# Patient Record
Sex: Female | Born: 1980 | Race: Black or African American | Hispanic: No | Marital: Single | Smoking: Former smoker
Health system: Southern US, Community
[De-identification: ages and names within clinical notes are randomized; demographics above are authoritative.]

## PROBLEM LIST (undated history)

## (undated) DIAGNOSIS — K219 Gastro-esophageal reflux disease without esophagitis: Secondary | ICD-10-CM

## (undated) DIAGNOSIS — O009 Unspecified ectopic pregnancy without intrauterine pregnancy: Secondary | ICD-10-CM

## (undated) DIAGNOSIS — F319 Bipolar disorder, unspecified: Secondary | ICD-10-CM

## (undated) DIAGNOSIS — A5901 Trichomonal vulvovaginitis: Secondary | ICD-10-CM

## (undated) DIAGNOSIS — N390 Urinary tract infection, site not specified: Secondary | ICD-10-CM

## (undated) DIAGNOSIS — F419 Anxiety disorder, unspecified: Secondary | ICD-10-CM

## (undated) DIAGNOSIS — A749 Chlamydial infection, unspecified: Secondary | ICD-10-CM

## (undated) DIAGNOSIS — G4700 Insomnia, unspecified: Secondary | ICD-10-CM

## (undated) DIAGNOSIS — K5900 Constipation, unspecified: Secondary | ICD-10-CM

## (undated) DIAGNOSIS — G5601 Carpal tunnel syndrome, right upper limb: Secondary | ICD-10-CM

## (undated) HISTORY — DX: Carpal tunnel syndrome, right upper limb: G56.01

---
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
## 2000-01-10 ENCOUNTER — Inpatient Hospital Stay (HOSPITAL_COMMUNITY): Admission: AD | Admit: 2000-01-10 | Discharge: 2000-01-10 | Payer: Self-pay | Admitting: Obstetrics

## 2000-04-17 ENCOUNTER — Inpatient Hospital Stay (HOSPITAL_COMMUNITY): Admission: AD | Admit: 2000-04-17 | Discharge: 2000-04-17 | Payer: Self-pay | Admitting: Obstetrics

## 2000-04-26 ENCOUNTER — Inpatient Hospital Stay (HOSPITAL_COMMUNITY): Admission: EM | Admit: 2000-04-26 | Discharge: 2000-04-26 | Payer: Self-pay | Admitting: *Deleted

## 2000-10-25 ENCOUNTER — Inpatient Hospital Stay (HOSPITAL_COMMUNITY): Admission: AD | Admit: 2000-10-25 | Discharge: 2000-10-25 | Payer: Self-pay | Admitting: Obstetrics

## 2001-03-25 ENCOUNTER — Inpatient Hospital Stay (HOSPITAL_COMMUNITY): Admission: AD | Admit: 2001-03-25 | Discharge: 2001-03-25 | Payer: Self-pay | Admitting: Obstetrics & Gynecology

## 2001-09-12 ENCOUNTER — Inpatient Hospital Stay (HOSPITAL_COMMUNITY): Admission: AD | Admit: 2001-09-12 | Discharge: 2001-09-12 | Payer: Self-pay | Admitting: Obstetrics

## 2001-11-02 ENCOUNTER — Inpatient Hospital Stay (HOSPITAL_COMMUNITY): Admission: AD | Admit: 2001-11-02 | Discharge: 2001-11-02 | Payer: Self-pay | Admitting: *Deleted

## 2002-03-31 ENCOUNTER — Emergency Department (HOSPITAL_COMMUNITY): Admission: EM | Admit: 2002-03-31 | Discharge: 2002-03-31 | Payer: Self-pay | Admitting: Emergency Medicine

## 2002-05-29 ENCOUNTER — Inpatient Hospital Stay (HOSPITAL_COMMUNITY): Admission: AD | Admit: 2002-05-29 | Discharge: 2002-05-29 | Payer: Self-pay

## 2003-07-13 ENCOUNTER — Inpatient Hospital Stay (HOSPITAL_COMMUNITY): Admission: AD | Admit: 2003-07-13 | Discharge: 2003-07-14 | Payer: Self-pay | Admitting: *Deleted

## 2004-08-19 ENCOUNTER — Inpatient Hospital Stay (HOSPITAL_COMMUNITY): Admission: AD | Admit: 2004-08-19 | Discharge: 2004-08-19 | Payer: Self-pay | Admitting: *Deleted

## 2004-11-04 ENCOUNTER — Inpatient Hospital Stay (HOSPITAL_COMMUNITY): Admission: AD | Admit: 2004-11-04 | Discharge: 2004-11-04 | Payer: Self-pay | Admitting: *Deleted

## 2005-06-17 DIAGNOSIS — A562 Chlamydial infection of genitourinary tract, unspecified: Secondary | ICD-10-CM | POA: Insufficient documentation

## 2005-09-02 ENCOUNTER — Emergency Department (HOSPITAL_COMMUNITY): Admission: EM | Admit: 2005-09-02 | Discharge: 2005-09-02 | Payer: Self-pay | Admitting: Emergency Medicine

## 2005-10-02 ENCOUNTER — Emergency Department (HOSPITAL_COMMUNITY): Admission: EM | Admit: 2005-10-02 | Discharge: 2005-10-02 | Payer: Self-pay | Admitting: Family Medicine

## 2005-11-07 ENCOUNTER — Emergency Department (HOSPITAL_COMMUNITY): Admission: EM | Admit: 2005-11-07 | Discharge: 2005-11-07 | Payer: Self-pay | Admitting: Emergency Medicine

## 2006-01-11 ENCOUNTER — Inpatient Hospital Stay (HOSPITAL_COMMUNITY): Admission: AD | Admit: 2006-01-11 | Discharge: 2006-01-11 | Payer: Self-pay | Admitting: Obstetrics

## 2006-03-10 ENCOUNTER — Ambulatory Visit: Payer: Self-pay | Admitting: Gynecology

## 2006-06-28 ENCOUNTER — Emergency Department (HOSPITAL_COMMUNITY): Admission: EM | Admit: 2006-06-28 | Discharge: 2006-06-28 | Payer: Self-pay | Admitting: Family Medicine

## 2006-09-18 ENCOUNTER — Emergency Department (HOSPITAL_COMMUNITY): Admission: EM | Admit: 2006-09-18 | Discharge: 2006-09-18 | Payer: Self-pay | Admitting: Family Medicine

## 2006-09-28 ENCOUNTER — Ambulatory Visit: Payer: Self-pay | Admitting: Internal Medicine

## 2006-09-28 DIAGNOSIS — A5901 Trichomonal vulvovaginitis: Secondary | ICD-10-CM

## 2006-10-12 ENCOUNTER — Ambulatory Visit: Payer: Self-pay | Admitting: *Deleted

## 2006-10-24 HISTORY — PX: ECTOPIC PREGNANCY SURGERY: SHX613

## 2006-12-21 ENCOUNTER — Ambulatory Visit: Payer: Self-pay | Admitting: Internal Medicine

## 2007-01-24 ENCOUNTER — Emergency Department (HOSPITAL_COMMUNITY): Admission: EM | Admit: 2007-01-24 | Discharge: 2007-01-24 | Payer: Self-pay | Admitting: Family Medicine

## 2007-05-04 ENCOUNTER — Ambulatory Visit: Payer: Self-pay | Admitting: Internal Medicine

## 2007-05-11 ENCOUNTER — Encounter (INDEPENDENT_AMBULATORY_CARE_PROVIDER_SITE_OTHER): Payer: Self-pay | Admitting: Internal Medicine

## 2007-05-11 LAB — CONVERTED CEMR LAB
Bilirubin Urine: NEGATIVE
Specific Gravity, Urine: 1.02

## 2007-06-18 ENCOUNTER — Encounter (INDEPENDENT_AMBULATORY_CARE_PROVIDER_SITE_OTHER): Payer: Self-pay | Admitting: Internal Medicine

## 2007-06-18 DIAGNOSIS — K219 Gastro-esophageal reflux disease without esophagitis: Secondary | ICD-10-CM | POA: Insufficient documentation

## 2007-06-18 DIAGNOSIS — R1013 Epigastric pain: Secondary | ICD-10-CM

## 2007-06-18 DIAGNOSIS — N760 Acute vaginitis: Secondary | ICD-10-CM | POA: Insufficient documentation

## 2007-06-18 DIAGNOSIS — K3189 Other diseases of stomach and duodenum: Secondary | ICD-10-CM

## 2007-06-18 DIAGNOSIS — K5900 Constipation, unspecified: Secondary | ICD-10-CM | POA: Insufficient documentation

## 2007-07-11 ENCOUNTER — Encounter (INDEPENDENT_AMBULATORY_CARE_PROVIDER_SITE_OTHER): Payer: Self-pay | Admitting: *Deleted

## 2007-08-03 ENCOUNTER — Encounter (INDEPENDENT_AMBULATORY_CARE_PROVIDER_SITE_OTHER): Payer: Self-pay | Admitting: *Deleted

## 2007-08-07 ENCOUNTER — Emergency Department (HOSPITAL_COMMUNITY): Admission: EM | Admit: 2007-08-07 | Discharge: 2007-08-08 | Payer: Self-pay | Admitting: Emergency Medicine

## 2007-08-10 ENCOUNTER — Encounter: Payer: Self-pay | Admitting: Obstetrics and Gynecology

## 2007-08-10 ENCOUNTER — Ambulatory Visit: Payer: Self-pay | Admitting: Obstetrics and Gynecology

## 2007-08-10 ENCOUNTER — Ambulatory Visit (HOSPITAL_COMMUNITY): Admission: AD | Admit: 2007-08-10 | Discharge: 2007-08-10 | Payer: Self-pay | Admitting: Obstetrics and Gynecology

## 2007-08-22 ENCOUNTER — Ambulatory Visit: Payer: Self-pay | Admitting: *Deleted

## 2007-08-31 ENCOUNTER — Ambulatory Visit: Payer: Self-pay | Admitting: Nurse Practitioner

## 2007-08-31 DIAGNOSIS — F411 Generalized anxiety disorder: Secondary | ICD-10-CM | POA: Insufficient documentation

## 2007-08-31 LAB — CONVERTED CEMR LAB
Chlamydia, Swab/Urine, PCR: NEGATIVE
Nitrite: NEGATIVE
Protein, U semiquant: 30

## 2007-09-05 ENCOUNTER — Telehealth (INDEPENDENT_AMBULATORY_CARE_PROVIDER_SITE_OTHER): Payer: Self-pay | Admitting: Nurse Practitioner

## 2008-01-09 ENCOUNTER — Emergency Department (HOSPITAL_COMMUNITY): Admission: EM | Admit: 2008-01-09 | Discharge: 2008-01-09 | Payer: Self-pay | Admitting: Emergency Medicine

## 2008-01-18 ENCOUNTER — Ambulatory Visit: Payer: Self-pay | Admitting: Internal Medicine

## 2008-01-18 DIAGNOSIS — G4700 Insomnia, unspecified: Secondary | ICD-10-CM | POA: Insufficient documentation

## 2008-01-18 DIAGNOSIS — J309 Allergic rhinitis, unspecified: Secondary | ICD-10-CM | POA: Insufficient documentation

## 2008-01-24 ENCOUNTER — Encounter (INDEPENDENT_AMBULATORY_CARE_PROVIDER_SITE_OTHER): Payer: Self-pay | Admitting: *Deleted

## 2008-02-22 ENCOUNTER — Telehealth (INDEPENDENT_AMBULATORY_CARE_PROVIDER_SITE_OTHER): Payer: Self-pay | Admitting: *Deleted

## 2008-03-07 ENCOUNTER — Ambulatory Visit: Payer: Self-pay | Admitting: Nurse Practitioner

## 2008-03-07 DIAGNOSIS — R634 Abnormal weight loss: Secondary | ICD-10-CM | POA: Insufficient documentation

## 2008-03-07 LAB — CONVERTED CEMR LAB
ALT: 10 units/L (ref 0–35)
Alkaline Phosphatase: 44 units/L (ref 39–117)
BUN: 8 mg/dL (ref 6–23)
Basophils Absolute: 0 10*3/uL (ref 0.0–0.1)
Basophils Relative: 0 % (ref 0–1)
Benzodiazepines.: NEGATIVE
Beta hcg, urine, semiquantitative: NEGATIVE
Chloride: 106 meq/L (ref 96–112)
GC Probe Amp, Genital: NEGATIVE
Glucose, Bld: 87 mg/dL (ref 70–99)
Glucose, Urine, Semiquant: NEGATIVE
HCT: 36 % (ref 36.0–46.0)
Hemoglobin: 11.7 g/dL — ABNORMAL LOW (ref 12.0–15.0)
KOH Prep: NEGATIVE
Ketones, urine, test strip: NEGATIVE
Lymphs Abs: 1.8 10*3/uL (ref 0.7–4.0)
Monocytes Relative: 16 % — ABNORMAL HIGH (ref 3–12)
Pap Smear: NEGATIVE
Phencyclidine (PCP): NEGATIVE
Potassium: 4.8 meq/L (ref 3.5–5.3)
RBC: 4.18 M/uL (ref 3.87–5.11)
RDW: 13.8 % (ref 11.5–15.5)
Specific Gravity, Urine: 1.02
Total Bilirubin: 0.5 mg/dL (ref 0.3–1.2)
Total Protein: 7.1 g/dL (ref 6.0–8.3)
Urobilinogen, UA: 1
WBC Urine, dipstick: NEGATIVE
WBC: 4.2 10*3/uL (ref 4.0–10.5)
pH: 7

## 2008-03-13 ENCOUNTER — Encounter (INDEPENDENT_AMBULATORY_CARE_PROVIDER_SITE_OTHER): Payer: Self-pay | Admitting: Nurse Practitioner

## 2008-04-24 ENCOUNTER — Ambulatory Visit: Payer: Self-pay | Admitting: Nurse Practitioner

## 2008-04-24 LAB — CONVERTED CEMR LAB
KOH Prep: NEGATIVE
Ketones, urine, test strip: NEGATIVE
Nitrite: NEGATIVE
Protein, U semiquant: 30
Urobilinogen, UA: 0.2
Whiff Test: POSITIVE
pH: 5.5

## 2008-05-29 ENCOUNTER — Ambulatory Visit: Payer: Self-pay | Admitting: Nurse Practitioner

## 2008-06-12 ENCOUNTER — Telehealth (INDEPENDENT_AMBULATORY_CARE_PROVIDER_SITE_OTHER): Payer: Self-pay | Admitting: Internal Medicine

## 2008-07-22 ENCOUNTER — Telehealth (INDEPENDENT_AMBULATORY_CARE_PROVIDER_SITE_OTHER): Payer: Self-pay | Admitting: Nurse Practitioner

## 2008-10-10 ENCOUNTER — Telehealth (INDEPENDENT_AMBULATORY_CARE_PROVIDER_SITE_OTHER): Payer: Self-pay | Admitting: Nurse Practitioner

## 2008-11-06 ENCOUNTER — Telehealth (INDEPENDENT_AMBULATORY_CARE_PROVIDER_SITE_OTHER): Payer: Self-pay | Admitting: Nurse Practitioner

## 2008-11-20 ENCOUNTER — Ambulatory Visit: Payer: Self-pay | Admitting: Nurse Practitioner

## 2008-11-20 LAB — CONVERTED CEMR LAB
Bilirubin Urine: NEGATIVE
Protein, U semiquant: NEGATIVE
Specific Gravity, Urine: 1.015
pH: 6.5

## 2009-02-04 ENCOUNTER — Ambulatory Visit: Payer: Self-pay | Admitting: Nurse Practitioner

## 2009-02-04 DIAGNOSIS — B373 Candidiasis of vulva and vagina: Secondary | ICD-10-CM

## 2009-02-04 LAB — CONVERTED CEMR LAB
Barbiturate Quant, Ur: NEGATIVE
Benzodiazepines.: NEGATIVE
Bilirubin Urine: NEGATIVE
Cocaine Metabolites: POSITIVE — AB
Creatinine,U: 432.4 mg/dL
GC Probe Amp, Urine: NEGATIVE
Glucose, Urine, Semiquant: NEGATIVE
Marijuana Metabolite: POSITIVE — AB
Phencyclidine (PCP): NEGATIVE
Propoxyphene: NEGATIVE

## 2009-02-10 ENCOUNTER — Emergency Department (HOSPITAL_COMMUNITY): Admission: EM | Admit: 2009-02-10 | Discharge: 2009-02-10 | Payer: Self-pay | Admitting: Family Medicine

## 2009-02-17 ENCOUNTER — Encounter: Admission: RE | Admit: 2009-02-17 | Discharge: 2009-02-17 | Payer: Self-pay | Admitting: Chiropractic Medicine

## 2009-03-11 ENCOUNTER — Telehealth (INDEPENDENT_AMBULATORY_CARE_PROVIDER_SITE_OTHER): Payer: Self-pay | Admitting: Nurse Practitioner

## 2009-04-03 ENCOUNTER — Encounter (INDEPENDENT_AMBULATORY_CARE_PROVIDER_SITE_OTHER): Payer: Self-pay | Admitting: Nurse Practitioner

## 2009-04-03 ENCOUNTER — Ambulatory Visit: Payer: Self-pay | Admitting: Nurse Practitioner

## 2009-04-03 LAB — CONVERTED CEMR LAB
ALT: 16 U/L
AST: 25 U/L
Albumin: 4.2 g/dL
Alkaline Phosphatase: 51 U/L
BUN: 7 mg/dL
Basophils Absolute: 0 10*3/uL
Basophils Relative: 0 %
CO2: 27 meq/L
Calcium: 9 mg/dL
Chlamydia, DNA Probe: NEGATIVE
Chloride: 102 meq/L
Cholesterol: 144 mg/dL
Creatinine, Ser: 0.82 mg/dL
Eosinophils Absolute: 0.1 10*3/uL
Eosinophils Relative: 2 %
GC Probe Amp, Genital: NEGATIVE
Glucose, Bld: 81 mg/dL
Glucose, Urine, Semiquant: NEGATIVE
HCT: 36.4 %
HDL: 48 mg/dL
Hemoglobin: 12.1 g/dL
Ketones, urine, test strip: NEGATIVE
LDL Cholesterol: 86 mg/dL
Lymphocytes Relative: 35 %
Lymphs Abs: 1.3 10*3/uL
MCHC: 33.2 g/dL
MCV: 86.1 fL
Monocytes Absolute: 0.4 10*3/uL
Monocytes Relative: 11 %
Neutro Abs: 1.9 10*3/uL
Neutrophils Relative %: 51 %
Nitrite: NEGATIVE
Platelets: 180 10*3/uL
Potassium: 4.6 meq/L
RBC: 4.23 M/uL
RDW: 13.2 %
Sodium: 138 meq/L
Specific Gravity, Urine: 1.015
TSH: 0.646 u[IU]/mL
Total Bilirubin: 0.6 mg/dL
Total CHOL/HDL Ratio: 3
Total Protein: 7.3 g/dL
Triglycerides: 50 mg/dL
VLDL: 10 mg/dL
WBC: 3.7 10*3/uL — ABNORMAL LOW

## 2009-04-07 ENCOUNTER — Encounter (INDEPENDENT_AMBULATORY_CARE_PROVIDER_SITE_OTHER): Payer: Self-pay | Admitting: Nurse Practitioner

## 2009-06-18 ENCOUNTER — Encounter (INDEPENDENT_AMBULATORY_CARE_PROVIDER_SITE_OTHER): Payer: Self-pay | Admitting: *Deleted

## 2009-09-10 ENCOUNTER — Ambulatory Visit: Payer: Self-pay | Admitting: Nurse Practitioner

## 2009-09-10 LAB — CONVERTED CEMR LAB
Beta hcg, urine, semiquantitative: NEGATIVE
Blood in Urine, dipstick: NEGATIVE
Glucose, Urine, Semiquant: NEGATIVE
Nitrite: NEGATIVE
Protein, U semiquant: 30
Specific Gravity, Urine: 1.02

## 2009-11-23 ENCOUNTER — Inpatient Hospital Stay (HOSPITAL_COMMUNITY): Admission: AD | Admit: 2009-11-23 | Discharge: 2009-11-23 | Payer: Self-pay | Admitting: Family Medicine

## 2010-01-29 ENCOUNTER — Telehealth (INDEPENDENT_AMBULATORY_CARE_PROVIDER_SITE_OTHER): Payer: Self-pay | Admitting: Nurse Practitioner

## 2010-02-01 ENCOUNTER — Ambulatory Visit: Payer: Self-pay | Admitting: Nurse Practitioner

## 2010-02-01 LAB — CONVERTED CEMR LAB
Bilirubin Urine: NEGATIVE
Blood in Urine, dipstick: NEGATIVE
Ketones, urine, test strip: NEGATIVE
Specific Gravity, Urine: >=1.03
Urobilinogen, UA: 0.2

## 2010-03-03 ENCOUNTER — Emergency Department (HOSPITAL_COMMUNITY): Admission: EM | Admit: 2010-03-03 | Discharge: 2010-03-03 | Payer: Self-pay | Admitting: Family Medicine

## 2010-05-10 ENCOUNTER — Ambulatory Visit: Payer: Self-pay | Admitting: Nurse Practitioner

## 2010-05-10 DIAGNOSIS — M65849 Other synovitis and tenosynovitis, unspecified hand: Secondary | ICD-10-CM

## 2010-05-10 DIAGNOSIS — M65839 Other synovitis and tenosynovitis, unspecified forearm: Secondary | ICD-10-CM | POA: Insufficient documentation

## 2010-05-10 LAB — CONVERTED CEMR LAB
Bilirubin Urine: NEGATIVE
KOH Prep: NEGATIVE
Nitrite: NEGATIVE
Protein, U semiquant: NEGATIVE
Rapid HIV Screen: NEGATIVE
Specific Gravity, Urine: 1.025

## 2010-05-11 ENCOUNTER — Encounter (INDEPENDENT_AMBULATORY_CARE_PROVIDER_SITE_OTHER): Payer: Self-pay | Admitting: Nurse Practitioner

## 2010-05-11 DIAGNOSIS — D649 Anemia, unspecified: Secondary | ICD-10-CM | POA: Insufficient documentation

## 2010-05-11 LAB — CONVERTED CEMR LAB
ALT: 11 units/L (ref 0–35)
AST: 19 units/L (ref 0–37)
Albumin: 4.2 g/dL (ref 3.5–5.2)
Alkaline Phosphatase: 48 units/L (ref 39–117)
BUN: 10 mg/dL (ref 6–23)
Basophils Relative: 0 % (ref 0–1)
Chlamydia, DNA Probe: NEGATIVE
Chloride: 103 meq/L (ref 96–112)
GC Probe Amp, Genital: NEGATIVE
Glucose, Bld: 87 mg/dL (ref 70–99)
HCT: 34.8 % — ABNORMAL LOW (ref 36.0–46.0)
MCHC: 31.3 g/dL (ref 30.0–36.0)
MCV: 87.2 fL (ref 78.0–100.0)
Monocytes Absolute: 0.6 10*3/uL (ref 0.1–1.0)
Monocytes Relative: 12 % (ref 3–12)
Neutro Abs: 2.8 10*3/uL (ref 1.7–7.7)
Neutrophils Relative %: 54 % (ref 43–77)
RDW: 14.2 % (ref 11.5–15.5)
TSH: 0.519 microintl units/mL (ref 0.350–4.500)
Total Protein: 7.1 g/dL (ref 6.0–8.3)

## 2010-07-05 ENCOUNTER — Emergency Department (HOSPITAL_COMMUNITY): Admission: EM | Admit: 2010-07-05 | Discharge: 2010-07-05 | Payer: Self-pay | Admitting: Emergency Medicine

## 2010-10-04 ENCOUNTER — Ambulatory Visit: Payer: Self-pay | Admitting: Nurse Practitioner

## 2010-11-11 ENCOUNTER — Emergency Department (HOSPITAL_COMMUNITY)
Admission: EM | Admit: 2010-11-11 | Discharge: 2010-11-11 | Payer: Self-pay | Source: Home / Self Care | Admitting: Emergency Medicine

## 2010-11-11 ENCOUNTER — Emergency Department (HOSPITAL_COMMUNITY)
Admission: EM | Admit: 2010-11-11 | Discharge: 2010-11-11 | Disposition: A | Discharge status: Other Acute Inpatient Hospital | Payer: Self-pay | Source: Home / Self Care | Admitting: Family Medicine

## 2010-11-13 ENCOUNTER — Encounter: Payer: Self-pay | Admitting: Emergency Medicine

## 2010-11-14 ENCOUNTER — Encounter: Payer: Self-pay | Admitting: *Deleted

## 2010-11-15 LAB — POCT URINALYSIS DIPSTICK
Bilirubin Urine: NEGATIVE
Ketones, ur: NEGATIVE mg/dL
Nitrite: NEGATIVE
Protein, ur: NEGATIVE mg/dL
Specific Gravity, Urine: 1.02 (ref 1.005–1.030)
Urine Glucose, Fasting: NEGATIVE mg/dL
pH: 5.5 (ref 5.0–8.0)

## 2010-11-15 LAB — WET PREP, GENITAL
Trich, Wet Prep: NONE SEEN
Yeast Wet Prep HPF POC: NONE SEEN

## 2010-11-15 LAB — GC/CHLAMYDIA PROBE AMP, GENITAL: Chlamydia, DNA Probe: NEGATIVE

## 2010-11-15 LAB — POCT PREGNANCY, URINE: Preg Test, Ur: NEGATIVE

## 2010-11-19 ENCOUNTER — Emergency Department (HOSPITAL_COMMUNITY)
Admission: EM | Admit: 2010-11-19 | Discharge: 2010-11-19 | Payer: Self-pay | Source: Home / Self Care | Admitting: Emergency Medicine

## 2010-11-19 LAB — RAPID URINE DRUG SCREEN, HOSP PERFORMED
Amphetamines: NOT DETECTED
Barbiturates: NOT DETECTED
Benzodiazepines: NOT DETECTED
Cocaine: POSITIVE — AB
Tetrahydrocannabinol: NOT DETECTED

## 2010-11-19 LAB — COMPREHENSIVE METABOLIC PANEL
ALT: 18 U/L (ref 0–35)
AST: 26 U/L (ref 0–37)
Albumin: 3.9 g/dL (ref 3.5–5.2)
BUN: 7 mg/dL (ref 6–23)
CO2: 27 mEq/L (ref 19–32)
Chloride: 101 mEq/L (ref 96–112)
Creatinine, Ser: 1.13 mg/dL (ref 0.4–1.2)
GFR calc non Af Amer: 57 mL/min — ABNORMAL LOW (ref >60)
Glucose, Bld: 84 mg/dL (ref 70–99)
Total Protein: 7.3 g/dL (ref 6.0–8.3)

## 2010-11-19 LAB — DIFFERENTIAL
Eosinophils Relative: 1 % (ref 0–5)
Lymphocytes Relative: 27 % (ref 12–46)
Lymphs Abs: 1.5 10*3/uL (ref 0.7–4.0)
Monocytes Absolute: 0.7 10*3/uL (ref 0.1–1.0)
Neutro Abs: 3.4 10*3/uL (ref 1.7–7.7)

## 2010-11-19 LAB — CBC
Hemoglobin: 12.4 g/dL (ref 12.0–15.0)
MCH: 27.2 pg (ref 26.0–34.0)
RDW: 14.1 % (ref 11.5–15.5)

## 2010-11-23 NOTE — Progress Notes (Signed)
Summary: Female problems   Phone Note Call from Patient Call back at Home Phone [phone] Call back at [phone]   Summary of Call: the pt has a femaile infection and although she has an appoitment on Monday, April 11 she is wondered if the provider can call in something to the pharmacy. [facility] [address]).   [facility] FNP Initial call taken by: Manon Hilding,  January 29, 2010 10:13 AM  Follow-up for Phone Call        pt will need at least a nurse visit for wet prep so unless she can come to get that before close today she should keep appt on monday Follow-up by: Lehman Prom FNP,  January 29, 2010 2:08 PM  Additional Follow-up for Phone Call Additional follow up Details #1::        pt seen in office today Additional Follow-up by: Lehman Prom FNP,  February 01, 2010 8:44 AM

## 2010-11-23 NOTE — Assessment & Plan Note (Signed)
Summary: Vaginal Discharge   Vital Signs:  Patient profile:   30 year old female Menstrual status:  regular LMP:     01/29/2010 Weight:      114.44 pounds Pulse rate:   71 / minute Pulse rhythm:   regular Resp:     20 per minute BP sitting:   111 / 72  (left arm) Cuff size:   regular  Vitals Entered By: Chauncy Passy, SMA CC: Pt. is here for a discharge and has a odor. She was treating it if it was a Yeast Infection but she started to notice the fowl odor last week. She states the area is irritated and it itches.  Is Patient Diabetic? No Pain Assessment Patient in pain? no       Does patient need assistance? Functional Status Self care Ambulation Normal LMP (date): 01/29/2010 LMP - Character: heavy     Enter LMP: 01/29/2010 Last PAP Result  Specimen Adequacy: Satisfactory for evaluation.   Interpretation/Result:Negative for intraepithelial Lesion or Malignancy.      CC:  Pt. is here for a discharge and has a odor. She was treating it if it was a Yeast Infection but she started to notice the fowl odor last week. She states the area is irritated and it itches. .  History of Present Illness:  Pt into the office the office with complaints of vaginal discharge Started about 2 weeks ago +odor initially discharge was white and she thought is was a yeast infection so she used some OTC monistat cream, symptoms did not resolve Still with discharge that returned after last menses +puritic +odorous -abdominal pain  -dysuria -hematuria  Menses - last day of menses was April 8th - regular Admits to sexual activity **previous dx of STD for which she was treated  Current Medications (verified): 1)  Prilosec Otc 20 Mg Tbec (Omeprazole Magnesium) .... One Tablet By Mouth Daily 2)  Lexapro 10 Mg  Tabs (Escitalopram Oxalate) .... Hold 3)  Fexofenadine Hcl 180 Mg  Tabs (Fexofenadine Hcl) .Marland Kitchen.. 1 Tab By Mouth Daily For Allergies 4)  Trazodone Hcl 50 Mg Tabs (Trazodone Hcl) ....  Hold  Allergies (verified): No Known Drug Allergies  Review of Systems General:  Denies fever. GI:  Denies abdominal pain. GU:  Complains of discharge; denies dysuria, hematuria, nocturia, and urinary frequency.  Physical Exam  General:  alert.   Head:  normocephalic.   Genitalia:  self wet prep done Msk:  normal ROM.   Neurologic:  alert & oriented X3.   Psych:  Oriented X3.     Impression & Recommendations:  Problem # 1:  TRICHOMONAL VAGINITIS (ICD-131.01)  handout given this is not the pt's first dx of this - advised her that partner needs treatment she should take antibiotics as ordered  Orders: UA Dipstick w/o Micro (manual) (55732) KOH/ WET Mount [phone])  Complete Medication List: 1)  Prilosec Otc 20 Mg Tbec (Omeprazole magnesium) .... One tablet by mouth daily 2)  Lexapro 10 Mg Tabs (Escitalopram oxalate) .... Hold 3)  Fexofenadine Hcl 180 Mg Tabs (Fexofenadine hcl) .Marland Kitchen.. 1 tab by mouth daily for allergies 4)  Trazodone Hcl 50 Mg Tabs (Trazodone hcl) .... Hold 5)  Metronidazole 500 Mg Tabs (Metronidazole) .... 4 tablets by mouth x 1 dose  Patient Instructions: 1)  You have an infection. 2)  Be sure to take all the antibiotics as ordered 3)  This is contagious so your partner needs to be treated also 4)  You physical exam  is due after June 11th, 2011 5)  Do not eat after midnight before this visit  Rx faxed to [facility] pharmacy  Prescriptions: METRONIDAZOLE 500 MG TABS (METRONIDAZOLE) 4 tablets by mouth x 1 dose  #4 x 0   Entered and Authorized by:   Lehman Prom FNP   Signed by:   Lehman Prom FNP on 02/01/2010   Method used:   Faxed to ...       [facility] (retail)       [address] [place], [place]  [address]       Ph: [phone]       Fax: [phone]   RxID:   [id number] METRONIDAZOLE 500 MG TABS (METRONIDAZOLE) 4 tablets by mouth x 1 dose  #4 x 0   Entered and Authorized by:   Lehman Prom FNP   Signed by:   Lehman Prom FNP on 02/01/2010   Method used:   Print then Give to Patient   RxID:   [id number]   Laboratory Results   Urine Tests    Routine Urinalysis   Glucose: negative   (Normal Range: Negative) Bilirubin: negative   (Normal Range: Negative) Ketone: negative   (Normal Range: Negative) Spec. Gravity: >=1.030   (Normal Range: 1.003-1.035) Blood: negative   (Normal Range: Negative) pH: 5.0   (Normal Range: 5.0-8.0) Protein: negative   (Normal Range: Negative) Urobilinogen: 0.2   (Normal Range: 0-1) Nitrite: negative   (Normal Range: Negative) Leukocyte Esterace: small   (Normal Range: Negative)    Date/Time Received: February 01, 2010 8:59 AM   [employer] Mount Source: vaginal WBC/hpf: 1-5 Bacteria/hpf: rare Clue cells/hpf: none Yeast/hpf: none Wet Mount KOH: Negative Trichomonas/hpf: moderate

## 2010-11-23 NOTE — Letter (Signed)
Summary: Handout Printed  Printed Handout:  - Trichomonas

## 2010-11-23 NOTE — Assessment & Plan Note (Signed)
Summary: Complete Physical Exam   Vital Signs:  Patient profile:   30 year old female Menstrual status:  regular Weight:      115.8 pounds BMI:     17.16 Temp:     98.3 degrees F oral Pulse rate:   81 / minute Pulse rhythm:   regular Resp:     20 per minute BP sitting:   104 / 71  (left arm) Cuff size:   regular  Vitals Entered By: Levon Hedger (May 10, 2010 3:22 PM) CC: CPP.Marland Kitchenvaginal irritation Is Patient Diabetic? No Pain Assessment Patient in pain? yes     Location: vagina  Does patient need assistance? Functional Status Self care Ambulation Normal   CC:  CPP.Marland Kitchenvaginal irritation.  History of Present Illness:  Pt into the office for a complete physical exam  PAP - last Pap done 1 year ago in this office, normal hx of abnormal paps in teenage years for which she was rechecked and it was ok menses - normal at this time no children  mammogram - never had a mammogram admits that she checks her breasts at home no family hx of breast cancer  Optho - no glasses or contacts  Dental - no recent dental exam but pt does admit that she does need appt.  tdap - up to date  Allergies: No Known Drug Allergies  Review of Systems General:  Denies fever. Eyes:  Denies discharge. ENT:  Denies earache. CV:  Denies chest pain or discomfort. Resp:  Denies cough. GI:  Denies abdominal pain. GU:  Denies dysuria. MS:  Complains of joint pain; left wrist. Derm:  Denies lesion(s). Neuro:  Denies headaches. Psych:  Complains of depression; pt admits that she has been stressed with work and school situation.  Physical Exam  General:  alert.   Head:  normocephalic.   Eyes:  pupils round.   Ears:  bil TM with bony landmarks  Nose:  no nasal discharge.   Mouth:  poor dentation Neck:  supple.   Chest Wall:  no deformities.   Breasts:  skin/areolae normal.   Lungs:  normal breath sounds.   Heart:  normal rate and regular rhythm.   Abdomen:  soft, non-tender, and  normal bowel sounds.   Rectal:  defer Neurologic:  alert & oriented X3.    Pelvic Exam  Vulva:      normal appearance.   Urethra and Bladder:      Urethra--normal.   Vagina:      malodorus, copious discharge.   Cervix:      nulliparous.   Uterus:      smooth.   Adnexa:      nontender bilaterally.     Wrist/Hand Exam  Wrist Exam:    Left:    Inspection:  Normal       Location:  left radial head    Stability:  stable    Tenderness:  left radial head    Swelling:  no    Erythema:  no    Impression & Recommendations:  Problem # 1:  ROUTINE GYNECOLOGICAL EXAMINATION (ICD-V72.31) labs done except lipids rec optho and dental exam PAP done  Orders: T-Comprehensive Metabolic Panel ([address]) T-CBC w/Diff ([address]-91478) Rapid HIV  ([address]) T-Syphilis Test (RPR) (13086-57846) T-TSH ([address]-28413) UA Dipstick w/o Micro (manual) (24401) KOH/ WET Mount [phone]) Pap Smear, Thin Prep ( Collection of) (Q0091) T- GC Chlamydia (36644)  Problem # 2:  ANXIETY (ICD-300.00) pt referred to LCSW she is not taking any current  medications Her updated medication list for this problem includes:    Lexapro 10 Mg Tabs (Escitalopram oxalate) ..... Hold    Trazodone Hcl 50 Mg Tabs (Trazodone hcl) ..... Hold  Orders: Misc. Referral (Misc. Ref)  Problem # 3:  TRICHOMONAL VAGINITIS (ICD-131.01) reviewed with pt  handout given  antibiotics prescribed - advised pt that partner will need treatment  Problem # 4:  TENDINITIS, LEFT WRIST (ICD-727.05) ace wrap applied pt advised to purchase a splint if able to avoid repeative movements  Complete Medication List: 1)  Prilosec Otc 20 Mg Tbec (Omeprazole magnesium) .... One tablet by mouth daily 2)  Lexapro 10 Mg Tabs (Escitalopram oxalate) .... Hold 3)  Fexofenadine Hcl 180 Mg Tabs (Fexofenadine hcl) .Marland Kitchen.. 1 tab by mouth daily for allergies 4)  Trazodone Hcl 50 Mg Tabs (Trazodone hcl) .... Hold 5)  Metronidazole 500 Mg Tabs  (Metronidazole) .... 4 tablets by mouth x 1 dose  Patient Instructions: 1)  Schedule an appointment with Aquilla Solian for couseling.  2)  Left wrist - wear support daily for at least the next 2 weeks  then you can wear during day and remove at night 3)  Follow up as needed Prescriptions: METRONIDAZOLE 500 MG TABS (METRONIDAZOLE) 4 tablets by mouth x 1 dose  #4 x 0   Entered and Authorized by:   Lehman Prom FNP   Signed by:   Lehman Prom FNP on 05/10/2010   Method used:   Print then Give to Patient   RxID:   [id number]   Laboratory Results   Urine Tests  Date/Time Received: May 10, 2010 3:40 PM   Routine Urinalysis   Color: lt. yellow Appearance: Clear Glucose: negative   (Normal Range: Negative) Bilirubin: negative   (Normal Range: Negative) Ketone: negative   (Normal Range: Negative) Spec. Gravity: 1.025   (Normal Range: 1.003-1.035) Blood: negative   (Normal Range: Negative) pH: 6.0   (Normal Range: 5.0-8.0) Protein: negative   (Normal Range: Negative) Urobilinogen: 1.0   (Normal Range: 0-1) Nitrite: negative   (Normal Range: Negative) Leukocyte Esterace: small   (Normal Range: Negative)    Date/Time Received: May 10, 2010 5:35 PM   Wet Mount/KOH Source: vaginal WBC/hpf: 1-5 Bacteria/hpf: rare Clue cells/hpf: none Yeast/hpf: none Trichomonas/hpf: none  Other Tests  Rapid HIV: negative

## 2010-11-23 NOTE — Letter (Signed)
Summary: Handout Printed  Printed Handout:  - Trichomonas 

## 2010-11-23 NOTE — Progress Notes (Signed)
Summary: Office Visit//DEPRESSION SCREENING  Office Visit//DEPRESSION SCREENING   Imported By: Arta Bruce 05/11/2010 11:42:07  _____________________________________________________________________  External Attachment:    Type:   Image     Comment:   External Document

## 2010-11-23 NOTE — Letter (Signed)
Summary: Lipid Letter  [facility]  [address] [place], [place] [address]   Phone: [phone]  Fax: [phone]    05/11/2010  Shelaine Frie [address] [place], [place]  [address]  Dear Aquilla Hacker:  We have carefully reviewed your last lipid profile from 04/03/2009 and the results are noted below with a summary of recommendations for lipid management.    Cholesterol:       144     Goal: less than 200   HDL "good" Cholesterol:   48     Goal: greater than 40   LDL "bad" Cholesterol:   86     Goal: less than 130   Triglycerides:       50     Goal: less than 150    Labs done during recent office visit show that you are anemic.  You should start to take a multivitamin daily.  This can be purchased over the counter.  Pap Smear results _____________________.     If you have any questions, please call. We appreciate being able to work with you.   Sincerely,    [facility] Lehman Prom FNP

## 2010-12-09 NOTE — Letter (Signed)
Summary: SOCIAL WORK/NO SHOW  SOCIAL WORK/NO SHOW   Imported By: Arta Bruce 11/30/2010 10:33:59  _____________________________________________________________________  External Attachment:    Type:   Image     Comment:   External Document

## 2011-01-06 LAB — POCT URINALYSIS DIPSTICK
Bilirubin Urine: NEGATIVE
Glucose, UA: NEGATIVE mg/dL
Ketones, ur: 15 mg/dL — AB
Nitrite: NEGATIVE

## 2011-01-06 LAB — GC/CHLAMYDIA PROBE AMP, GENITAL
Chlamydia, DNA Probe: NEGATIVE
GC Probe Amp, Genital: NEGATIVE

## 2011-01-06 LAB — POCT PREGNANCY, URINE: Preg Test, Ur: NEGATIVE

## 2011-01-06 LAB — WET PREP, GENITAL: Yeast Wet Prep HPF POC: NONE SEEN

## 2011-01-10 LAB — URINALYSIS, ROUTINE W REFLEX MICROSCOPIC
Bilirubin Urine: NEGATIVE
Glucose, UA: NEGATIVE mg/dL
Hgb urine dipstick: NEGATIVE
Ketones, ur: NEGATIVE mg/dL
Nitrite: NEGATIVE
Specific Gravity, Urine: >1.03 — ABNORMAL HIGH (ref 1.005–1.030)
pH: 5.5 (ref 5.0–8.0)

## 2011-01-10 LAB — URINE MICROSCOPIC-ADD ON

## 2011-01-10 LAB — GC/CHLAMYDIA PROBE AMP, GENITAL: GC Probe Amp, Genital: NEGATIVE

## 2011-01-10 LAB — WET PREP, GENITAL

## 2011-01-11 LAB — POCT PREGNANCY, URINE: Preg Test, Ur: NEGATIVE

## 2011-01-11 LAB — POCT URINALYSIS DIP (DEVICE)
Bilirubin Urine: NEGATIVE
Ketones, ur: NEGATIVE mg/dL
Specific Gravity, Urine: 1.02 (ref 1.005–1.030)
pH: 7 (ref 5.0–8.0)

## 2011-01-11 LAB — WET PREP, GENITAL: Yeast Wet Prep HPF POC: NONE SEEN

## 2011-01-11 LAB — GC/CHLAMYDIA PROBE AMP, GENITAL: Chlamydia, DNA Probe: NEGATIVE

## 2011-03-08 NOTE — Op Note (Signed)
NAME:  Natalie Fischer, Natalie Fischer              ACCOUNT NO.:  [id number]   MEDICAL RECORD NO.:  [id number]          PATIENT TYPE:  AMB   LOCATION:  [facility]                          FACILITY:  [facility]   PHYSICIAN:  Tilda Burrow, M.D. DATE OF BIRTH:  June 04, 1981   DATE OF PROCEDURE:  DATE OF DISCHARGE:  08/10/2007                               OPERATIVE REPORT   PREOPERATIVE DIAGNOSIS:  1. Right unruptured ectopic pregnancy.  2. Infertility.   POSTOPERATIVE DIAGNOSIS:  1. Right unruptured ectopic pregnancy.  2. Infertility.  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   OPERATION PERFORMED:  Laparoscopic right linear salpingostomy, left  neosalpingostomy with lysis of adhesions.   SURGEON:  Tilda Burrow, M.D.   ASSISTANT:  None.   ANESTHESIA:  General.   COMPLICATIONS:  None.   FINDINGS:  See photos which include extensive adhesions omentum to right  adnexa, left tube and ovary to left pelvic side wall, Fitzhugh-Curtis  adhesions to liver dome, not disrupted.   INDICATIONS:  A 30 year old, gravida 1, para 0, with diagnosis of  ectopic pregnancy this p.m., longstanding infertility with 1.8 cm cystic  structure in the ampullary portion of the right tube.   DETAILS OF PROCEDURE:  Patient was taken to the operating room, prepped  and draped for abdominal and vaginal procedure with Hulka tenaculum  attached to the cervix, Foley catheter in place and abdomen prepped and  draped.  Infraumbilical vertical incision was made through the  umbilicus, as well as suprapubically and in the right lower quadrant at  McBurney's point.  The Veress needle was introduced through the  umbilicus being careful to orient it toward the pelvis due to the  patient's slim body habitus.  Pneumoperitoneum was achieved under 7 mmHg  pressure and the laparoscopic trocar introduced with ease after 3 liters  CO2 introduced, and laparoscopic examination of the abdomen revealed a  thin coat of  blood in the abdomen and no evidence of trauma related to  peritoneal entry.  Suprapubic and right lower quadrant trocars were  introduced under direct visualization carefully and attention directed  to the pelvis.  The patient was placed in marked Trendelenburg position,  the omentum grasped, placed on traction and sharply transected free from  its adhesions to the right lower quadrant.  The left adnexa was then  inspected.  The tube and ovary was extremely elongate, tortuous and  attached to the pelvic side wall.  Thin, filmy, old, longstanding  adhesions were sharply dissected free while placing the tube on slight  counter traction using Babcock grasping clamp being careful not to crush  the tube itself.  Tube could be freed up sufficiently, the ovary was  exposed and then the ovary peeled from its thin membranous adhesions  over its surfaces.  Normal anatomy was restored.  Some thin bands of  adhesion were completely transected and taken out as specimen.  The left  tube was distended with what appeared to be a clear hydrosalpinx and so  it was split at the tip with development of a neosalpingostomy with a  relatively small opening performed.  It was opened full width of the  hydrosalpinx tip with hopes that patency could be retained.  Irrigation  of the adnexa confirmed hemostasis.  The opposite tube was now  addressed.  The very ampullary location of the ectopic allowed for  injection of the broad ligament and the infundibulopelvic ligament with  dilute Pitressin containing solution as well as injection over the  antimesenteric portion of the ectopic.  The tube was then opened using  first point cautery very minimally and then transection through this to  open up the ectopic.  This was opened for a distance perhaps 2 cm which  allowed shelling out of the ectopic tissue and with irrigation then  performed of the bed.  The specimen was taken out through the umbilical  port using an  EndoCatch bag while a 5 mm camera was used through the  suprapubic site.  Pelvis was irrigated copiously, inspected for  hemostasis and normalcy of anatomy confirmed.  Photos documented the  procedure intermittently with 9 photos taken, including 1 of the liver  showing a band of Fitzhugh-Curtis adhesions to the anterior abdominal  wall.   The pelvis was inspected again, confirmed hemostatic, 200 cc of saline  left in the abdomen, and the abdomen deflated and the fascial closure of  the umbilical port performed with 0 Vicryl then subcuticular 4-0 Dexon  used at all 3 port sites for tissue skin edge approximation with Steri-  Strips positioned.  Sponge and needle counts were correct.  Patient went  to recovery room in good condition.  Will have blood type confirmed  prior to discharge.  Admitting hemoglobin was 11, hematocrit 33.1.      Tilda Burrow, M.D.  Electronically Signed     JVF/MEDQ  D:  08/10/2007  T:  08/12/2007  Job:  [id number]   cc:   Teaching Service

## 2011-03-08 NOTE — Group Therapy Note (Signed)
NAME:  Natalie Fischer, Natalie Fischer NO.:  [id number]   MEDICAL RECORD NO.:  [id number]          PATIENT TYPE:  WOC   LOCATION:  [facility]                   FACILITY:  [facility]   PHYSICIAN:  Karlton Lemon, MD      DATE OF BIRTH:  30-Jan-1981   DATE OF SERVICE:  08/22/2007                                  CLINIC NOTE   CHIEF COMPLAINT:  Postoperative follow-up.   HISTORY OF PRESENT ILLNESS:  This is a gravida 1, para 0, status post  laparoscopic right linear salpingostomy and left neosalpingostomy with  lysis of adhesions performed on August 10, 2007, secondary to a right  unruptured ectopic pregnancy.  The patient states that she has been  doing well postoperatively, but is having some right lower quadrant and  flank pain today.  She states that it is crampy in nature without  radiation.  The pain is rated a 3/10 in severity without radiation.  The  patient states that she has also been constipated with her last bowel  movement four days ago.  She has been seen for this in the past and has  been on over-the-counter laxatives.  She also complains of dysuria  without increased frequency.  She does complain of some urinary urgency.  The patient is currently not taking any birth control, but is using a  barrier method with condoms.  She states that she has taken oral  contraceptive pills in the past that cause her to vomit.  She has taken  Depo also in the past and she states that makes her hair come out.  She  has no other complaints today.   PAST MEDICAL HISTORY:  1. History of ovarian cyst.  2. History of constipation.   PAST SURGICAL HISTORY:  Right salpingostomy secondary to right  unruptured ectopic pregnancy performed on August 10, 2007.   MEDICATIONS:  None.  The patient was taking Doxycycline, but was unable  to finish the course due to nausea and vomiting.   ALLERGIES:  No known drug allergies.   REVIEW OF SYSTEMS:  Negative except for what has been stated in the  history of present illness.   PHYSICAL EXAMINATION:  GENERAL:  This is a well-appearing, thin female  in no distress.  VITAL SIGNS:  Temperature 97.5, pulse 74, blood pressure 111/64.  CARDIOVASCULAR:  Regular rate and rhythm with no murmurs, rubs, or  gallops.  LUNGS:  Clear to auscultation bilaterally.  ABDOMEN:  Soft, mild tenderness noted in the right lower quadrant  without rebound tenderness or guarding.  There are positive bowel sounds  noted in all quadrants.  There is no mass palpated.  GENITOURINARY:  Normal female external genitalia.  Vaginal mucosa is  pink and moist with rugations noted.  Cervix is midline without  discharge from the os.  Clear vaginal discharge is noted.  Bimanual  elicits midline uterus, normal size that is slightly anteverted.  Adnexa  are soft without tenderness on palpation of the uterus or adnexa.  EXTREMITIES:  No edema or tenderness.   ASSESSMENT:  This is a 30 year old gravida 1, para 0, status post right  salpingostomy, left  neosalpingostomy with lysis of adhesions for a right  unruptured ectopic pregnancy on August 10, 2007.   1. Discussed birth control with the patient and she elects to try Depo-      Provera one more time.  She is currently spotting and believes this      is the beginning of her period.  2. Urinalysis shows small leukocyte esterase, 100 protein, large      amount of blood, and the patient is complaining of urinary tract      symptoms.  We will treat with Macrobid 100 mg p.o. b.i.d. and send      urine for culture and sensitivity.  3. The patient is complaining of mild abdominal pain.  On operative      report there is note of Fitz-Hugh-Curtis related to the adhesions.      We will check gonorrhea and Chlamydia today.  4. The patient has been complaining of vaginal discharge and we will      check a wet prep, though discharge does look non-pathologic.  5. The patient is instructed to follow with her primary care physician       to discuss her constipation.  She has been instructed to use over-      the-counter laxatives as needed.           ______________________________  Karlton Lemon, MD     NS/MEDQ  D:  08/22/2007  T:  08/22/2007  Job:  [id number]

## 2011-03-11 NOTE — Group Therapy Note (Signed)
NAME:  Natalie Fischer, FELDMEIER NO.:  [id number]   MEDICAL RECORD NO.:  [id number]          PATIENT TYPE:  WOC   LOCATION:  [facility]                   FACILITY:  [facility]   PHYSICIAN:  Ellis Parents, MD    DATE OF BIRTH:  1980/11/08   DATE OF SERVICE:  03/10/2006                                    CLINIC NOTE   This 30 year old nulliparous female comes for a followup for an ovarian  cyst.  The patient went into [facility] on 01/11/2006 for nausea and vomiting  intermittently of a year duration.  Pelvic ultrasound was performed and was  normal except for a 2.8 cm x 2.2 cm complex cystic area of the right ovary  which was thought to be a probable hemorrhagic cyst and suggested followup  ultrasound in six weeks was recommended.  The patient has a year to a year-  and-a-half history of intermittent epigastric discomfort associated with  nausea and vomiting sometimes 2-3 times a day.  The patient has lost  approximately 15 pounds in the past six months.  She has not had any medical  attention.   PELVIC:  External genitalia are normal. The vagina contains some menstrual  blood.  The cervix is clean.  The uterus is anterior, normal in size and  mobile.  Both adnexa are soft.   The patient is advised to go to the health department for a Pap smear and to  go over to internal medicine clinic at [facility] for evaluation of her GI  history.           ______________________________  Ellis Parents, MD     SA/MEDQ  D:  03/10/2006  T:  03/10/2006  Job:  [id number]

## 2011-05-02 ENCOUNTER — Inpatient Hospital Stay (INDEPENDENT_AMBULATORY_CARE_PROVIDER_SITE_OTHER)
Admission: RE | Admit: 2011-05-02 | Discharge: 2011-05-02 | Disposition: A | Discharge status: Home / Self Care | Payer: Self-pay | Source: Ambulatory Visit | Attending: Emergency Medicine | Admitting: Emergency Medicine

## 2011-05-02 DIAGNOSIS — N760 Acute vaginitis: Secondary | ICD-10-CM

## 2011-05-02 LAB — POCT URINALYSIS DIP (DEVICE)
Hgb urine dipstick: NEGATIVE
Protein, ur: NEGATIVE mg/dL
Specific Gravity, Urine: 1.025 (ref 1.005–1.030)
Urobilinogen, UA: 1 mg/dL (ref 0.0–1.0)

## 2011-05-02 LAB — WET PREP, GENITAL

## 2011-05-03 LAB — GC/CHLAMYDIA PROBE AMP, GENITAL
Chlamydia, DNA Probe: UNDETERMINED
GC Probe Amp, Genital: NEGATIVE

## 2011-07-18 LAB — POCT URINALYSIS DIP (DEVICE)
Operator id: 239701
Protein, ur: 100 — AB
Specific Gravity, Urine: 1.025
Urobilinogen, UA: 0.2

## 2011-07-18 LAB — POCT PREGNANCY, URINE
Operator id: 239701
Preg Test, Ur: NEGATIVE

## 2011-08-03 LAB — I-STAT 8, (EC8 V) (CONVERTED LAB)
Glucose, Bld: 89
Potassium: 4
TCO2: 30
pCO2, Ven: 54.4 — ABNORMAL HIGH
pH, Ven: 7.32 — ABNORMAL HIGH

## 2011-08-03 LAB — DIFFERENTIAL
Basophils Relative: 0
Eosinophils Relative: 1
Lymphocytes Relative: 20
Monocytes Absolute: 0.8 — ABNORMAL HIGH
Monocytes Relative: 9
Neutrophils Relative %: 70

## 2011-08-03 LAB — URINE MICROSCOPIC-ADD ON

## 2011-08-03 LAB — WET PREP, GENITAL: Yeast Wet Prep HPF POC: NONE SEEN

## 2011-08-03 LAB — POCT URINALYSIS DIP (DEVICE)
Glucose, UA: NEGATIVE
Specific Gravity, Urine: 1.02

## 2011-08-03 LAB — URINALYSIS, ROUTINE W REFLEX MICROSCOPIC
Bilirubin Urine: NEGATIVE
Glucose, UA: NEGATIVE
Ketones, ur: 15 — AB
Protein, ur: 30 — AB

## 2011-08-03 LAB — CBC
Hemoglobin: 11.5 — ABNORMAL LOW
Platelets: 202
RBC: 3.78 — ABNORMAL LOW
WBC: 8.6

## 2011-08-03 LAB — POCT I-STAT CREATININE: Operator id: 277751

## 2011-08-03 LAB — ABO/RH: ABO/RH(D): A POS

## 2011-08-03 LAB — HCG, QUANTITATIVE, PREGNANCY
hCG, Beta Chain, Quant, S: 21644 — ABNORMAL HIGH
hCG, Beta Chain, Quant, S: 21919 — ABNORMAL HIGH

## 2011-08-03 LAB — POCT PREGNANCY, URINE: Operator id: 277751

## 2012-04-18 ENCOUNTER — Emergency Department (HOSPITAL_COMMUNITY)
Admission: EM | Admit: 2012-04-18 | Discharge: 2012-04-18 | Disposition: A | Discharge status: Home / Self Care | Payer: Self-pay | Attending: Emergency Medicine | Admitting: Emergency Medicine

## 2012-04-18 ENCOUNTER — Encounter (HOSPITAL_COMMUNITY): Payer: Self-pay | Admitting: Family Medicine

## 2012-04-18 DIAGNOSIS — R102 Pelvic and perineal pain: Secondary | ICD-10-CM

## 2012-04-18 DIAGNOSIS — N939 Abnormal uterine and vaginal bleeding, unspecified: Secondary | ICD-10-CM

## 2012-04-18 DIAGNOSIS — N926 Irregular menstruation, unspecified: Secondary | ICD-10-CM | POA: Insufficient documentation

## 2012-04-18 DIAGNOSIS — T192XXA Foreign body in vulva and vagina, initial encounter: Secondary | ICD-10-CM | POA: Insufficient documentation

## 2012-04-18 DIAGNOSIS — IMO0002 Reserved for concepts with insufficient information to code with codable children: Secondary | ICD-10-CM | POA: Insufficient documentation

## 2012-04-18 LAB — WET PREP, GENITAL
Trich, Wet Prep: NONE SEEN
WBC, Wet Prep HPF POC: NONE SEEN
Yeast Wet Prep HPF POC: NONE SEEN

## 2012-04-18 MED ORDER — IBUPROFEN 800 MG PO TABS: 800.0000 mg | ORAL_TABLET | Freq: Three times a day (TID) | ORAL | Status: AC

## 2012-04-18 MED ORDER — HYDROCODONE-ACETAMINOPHEN 5-325 MG PO TABS
1.0000 | ORAL_TABLET | Freq: Once | ORAL | Status: AC
  Administered 2012-04-18: 1 via ORAL
  Filled 2012-04-18: qty 1

## 2012-04-18 NOTE — ED Provider Notes (Signed)
History     CSN: [id number]  Arrival date & time 04/18/12  0904   First MD Initiated Contact with Patient 04/18/12 [phone]      Chief Complaint  Patient presents with  . Abdominal Pain  . Vaginal Bleeding    (Consider location/radiation/quality/duration/timing/severity/associated sxs/prior treatment) Patient is a 31 y.o. female presenting with abdominal pain and vaginal bleeding. The history is provided by the patient.  Abdominal Pain The primary symptoms of the illness include abdominal pain and vaginal bleeding. The primary symptoms of the illness do not include fever, nausea, dysuria or vaginal discharge.  Associated symptoms comments: She reports irregular vaginal bleeding over the last 1 1/2 months. She is having lower abdominal pain for the past several days more consistently than previous symptoms. No vaginal discharge. She reports she may have retained products in vagina. No dysuria, fever or vomiting..  Vaginal Bleeding Associated symptoms include abdominal pain. Pertinent negatives include no fever or nausea.    History reviewed. No pertinent past medical history.  History reviewed. No pertinent past surgical history.  History reviewed. No pertinent family history.  History  Substance Use Topics  . Smoking status: Not on file  . Smokeless tobacco: Not on file  . Alcohol Use: Yes    OB History    Grav Para Term Preterm Abortions TAB SAB Ect Mult Living                  Review of Systems  Constitutional: Negative for fever.  Gastrointestinal: Positive for abdominal pain. Negative for nausea.  Genitourinary: Positive for vaginal bleeding. Negative for dysuria and vaginal discharge.    Allergies  Review of patient's allergies indicates no known allergies.  Home Medications   Current Outpatient Rx  Name Route Sig Dispense Refill  . ASENAPINE MALEATE 5 MG SL SUBL Sublingual Place 5 mg under the tongue at bedtime.    Marland Kitchen LANSOPRAZOLE 15 MG PO CPDR Oral Take 15  mg by mouth daily.    . SERTRALINE HCL 100 MG PO TABS Oral Take 100 mg by mouth 2 (two) times daily.      BP 122/108  Pulse 85  Temp 98.4 F (36.9 C)  Resp 18  SpO2 100%  Physical Exam  Constitutional: She appears well-developed and well-nourished.  Cardiovascular: Normal rate and regular rhythm.   Pulmonary/Chest: Effort normal and breath sounds normal.  Abdominal: Soft. Bowel sounds are normal. There is no tenderness. There is no rebound and no guarding.  Genitourinary: Vagina normal.       Minimal cervical bleeding without purulent discharge. Retained tampon recovered and removed. No adnexal tenderness.  Musculoskeletal: Normal range of motion.  Neurological: She is alert.  Skin: Skin is warm and dry. No rash noted.  Psychiatric: She has a normal mood and affect.    ED Course  Procedures (including critical care time)  Labs Reviewed  WET PREP, GENITAL - Abnormal; Notable for the following:    Clue Cells Wet Prep HPF POC RARE (*)     All other components within normal limits  PREGNANCY, URINE  GC/CHLAMYDIA PROBE AMP, GENITAL   Results for orders placed during the hospital encounter of 04/18/12  PREGNANCY, URINE      Component Value Range   Preg Test, Ur NEGATIVE  NEGATIVE  WET PREP, GENITAL      Component Value Range   Yeast Wet Prep HPF POC NONE SEEN  NONE SEEN   Trich, Wet Prep NONE SEEN  NONE SEEN   Clue  Cells Wet Prep HPF POC RARE (*) NONE SEEN   WBC, Wet Prep HPF POC NONE SEEN  NONE SEEN    No results found.   No diagnosis found. 1. Retained vaginal tampon 2. Pelvic pain, suspect secondary to #1 3. Irregular vaginal bleeding.   MDM  She reports history of ectopic pregnancy. Test is negative today. Do not feel antibiotics for retained products in vaginal vault are required given no fever. She can be discharged home and should follow up with GYN.        Rodena Medin, PA-C 04/18/12 1245

## 2012-04-18 NOTE — Discharge Instructions (Signed)
YOUR LAB STUDIES ARE ESSENTIALLY WITHIN NORMAL LIMITS. THE VAGINAL FOREIGN BODY WAS REMOVED AND ANTIBIOTICS ARE NOT REQUIRED. FOLLOW UP WITH [facility] FOR FURTHER EVALUATION IF YOUR SYMPTOMS DO NOT IMPROVE AND FOR MANAGEMENT OF IRREGULAR VAGINAL BLEEDING.  Vaginal Foreign Body There are many objects that are made to be placed in the vagina for contraception, to control bleeding during a menstrual period and medical treatment. Others are not, and are put in the vagina by accident or on purpose. The objects found in the vagina are referred to as "foreign bodies." Vaginal foreign bodies are most commonly seen in children. Adolescent girls and adult women are usually found to have forgotten a tampon, part of a contraceptive sponge or latex from a broken condom in the vagina. There may be a FB from a sexual stimulating device or at times an object is found in the vagina from sexual abuse. SYMPTOMS   There may not be symptoms noticed for a couple of days, especially with small objects.   Vaginal bleeding.   Vaginal discharge with or without a bad odor.   Vaginal itching.   Pain, redness, swelling or a rash may develop in and around the vaginal opening.   Painful urination.   Abdominal pain especially with a large object or an object that made a hole in (perforated) the vagina and ended up in the abdomen.   Vaginal infection after the FB is removed.  RELATED COMPLICATIONS  A serious life threatening blood infection (toxic shock syndrome).   A pelvic puss-filled pocket (abscess) may form.   Recurrent vaginal infections.   Perforation of the vagina by the FB.  DIAGNOSIS  A diagnosis is made by taking a detailed history, sometimes difficult to do with children. Examination alone by your caregiver usually determines the presence of a FB. Sometimes tests are necessary, such as:  X-ray.   Ultrasound.   CT scan.   Vaginal cultures for infection.   Blood tests.    TREATMENT   The only treatment necessary usually is to remove the FB. Do not try to remove the FB yourself unless it is easily felt and grasped because you may push it farther in the vagina or injure the vagina.   Treatment of a vaginal infection with antibiotic pills and/or vaginal cream if it is present or occurs later.   If the FB is large or has been in the vagina for a long period of time it may be necessary to remove it by giving the person anesthesia.   Hospitalization may be necessary if the person has toxic shock.   Surgery is necessary when there is a pelvic abscess or the FB penetrated the vagina and is in the pelvis or abdomen.  HOME CARE INSTRUCTIONS   Teach your children about their body parts.   Teach them to wipe from front to back.   Do not leave tampons in place for more than 6 to 8 hours. Do not wear them to bed (use a sanitary pad when asleep).   Do not place foreign objects in the vagina for sexual activities.   Do not douche. It is not necessary to douche to clean the vagina.  SEEK MEDICAL CARE IF:   You think your child placed a FB in her vagina.   You think you left a tampon, sponge or broken condom in the vagina.   You develop a foul smelling vaginal discharge.   You develop vaginal bleeding, pain or swelling.   You  develop swelling, redness or rash on the outside of the vagina.   You develop pain with urination.  SEEK IMMEDIATE MEDICAL CARE IF:   You develop an unexplained oral temperature of 102 F (38.9 C) or more.   You develop chills, weakness or pass out.   You develop abdominal pain.   You develop a red rash on the palms of your hands and feet.   You have a convulsion.   You develop vomiting and diarrhea.  Document Released: 11/17/2004 Document Revised: 09/29/2011 Document Reviewed: 09/30/2008 [facility] Patient Information 2012 [place], [place].

## 2012-04-18 NOTE — ED Notes (Signed)
Discharged home with written and verbal instructions.   

## 2012-04-18 NOTE — ED Notes (Signed)
Per pt has been on her period for 2 months off an on. sts right abdominal and pelvic pain. sts she stuck some tissue in her vagina and believes it is stuck in there because of a foul odor. Pt also complaining of tooth abscess.

## 2012-04-19 LAB — GC/CHLAMYDIA PROBE AMP, GENITAL: GC Probe Amp, Genital: NEGATIVE

## 2012-04-19 NOTE — ED Provider Notes (Signed)
Medical screening examination/treatment/procedure(s) were performed by non-physician practitioner and as supervising physician I was immediately available for consultation/collaboration.   Benny Lennert, MD 04/19/12 [phone]

## 2012-08-08 ENCOUNTER — Emergency Department (INDEPENDENT_AMBULATORY_CARE_PROVIDER_SITE_OTHER)
Admission: EM | Admit: 2012-08-08 | Discharge: 2012-08-08 | Disposition: A | Discharge status: Home / Self Care | Payer: Self-pay | Source: Home / Self Care | Attending: Emergency Medicine | Admitting: Emergency Medicine

## 2012-08-08 ENCOUNTER — Encounter (HOSPITAL_COMMUNITY): Payer: Self-pay | Admitting: *Deleted

## 2012-08-08 DIAGNOSIS — B9689 Other specified bacterial agents as the cause of diseases classified elsewhere: Secondary | ICD-10-CM

## 2012-08-08 DIAGNOSIS — A499 Bacterial infection, unspecified: Secondary | ICD-10-CM

## 2012-08-08 DIAGNOSIS — N760 Acute vaginitis: Secondary | ICD-10-CM

## 2012-08-08 HISTORY — DX: Gastro-esophageal reflux disease without esophagitis: K21.9

## 2012-08-08 LAB — POCT URINALYSIS DIP (DEVICE)
Bilirubin Urine: NEGATIVE
Hgb urine dipstick: NEGATIVE
Nitrite: NEGATIVE
Urobilinogen, UA: 0.2 mg/dL (ref 0.0–1.0)
pH: 5.5 (ref 5.0–8.0)

## 2012-08-08 MED ORDER — METRONIDAZOLE 500 MG PO TABS: 500.0000 mg | ORAL_TABLET | Freq: Two times a day (BID) | ORAL | Status: DC

## 2012-08-08 NOTE — ED Provider Notes (Signed)
Chief Complaint  Patient presents with  . Vaginal Discharge    History of Present Illness:   The patient is a 31 year old female who presents with a one-week history of a pinkish brown discharge with some odor. She denies any itching. She tried Monistat without relief. She feels slight irritation and mild pelvic pain. She denies any fever, chills, nausea, vomiting, or urinary symptoms. Her last menstrual period began last Wednesday. She is sexually active with consistent use of condoms. She's had no prior history of vaginitis or STDs. She has a history of reflux and bipolar disorder and is on Prevacid, Zoloft, and Saphris.  Review of Systems:  Other than noted above, the patient denies any of the following symptoms: Systemic:  No fever, chills, sweats, fatigue, or weight loss. GI:  No abdominal pain, nausea, anorexia, vomiting, diarrhea, constipation, melena or hematochezia. GU:  No dysuria, frequency, urgency, hematuria, vaginal discharge, itching, or abnormal vaginal bleeding. Skin:  No rash or itching.  PMFSH:  Past medical history, family history, social history, meds, and allergies were reviewed.  Physical Exam:   Vital signs:  BP 112/80  Pulse 72  Temp 98.6 F (37 C) (Oral)  Resp 16  SpO2 100%  LMP 08/04/2012 General:  Alert, oriented and in no distress. Lungs:  Breath sounds clear and equal bilaterally.  No wheezes, rales or rhonchi. Heart:  Regular rhythm.  No gallops or murmers. Abdomen:  Soft, flat and non-distended.  No organomegaly or mass.  No tenderness, guarding or rebound.  Bowel sounds normally active.  Pelvic exam:  Normal external genitalia. There is a large amount of homogeneous, white, malodorous vaginal discharge. Vaginal and cervical mucosa appeared otherwise normal. No cervical motion pain. Uterus was normal in size and nontender. She has mild bilateral adnexal tenderness but no mass. Skin:  Clear, warm and dry.  Labs:   Results for orders placed during the  hospital encounter of 08/08/12  POCT PREGNANCY, URINE      Component Value Range   Preg Test, Ur NEGATIVE  NEGATIVE  POCT URINALYSIS DIP (DEVICE)      Component Value Range   Glucose, UA NEGATIVE  NEGATIVE mg/dL   Bilirubin Urine NEGATIVE  NEGATIVE   Ketones, ur NEGATIVE  NEGATIVE mg/dL   Specific Gravity, Urine >=1.030  1.005 - 1.030   Hgb urine dipstick NEGATIVE  NEGATIVE   pH 5.5  5.0 - 8.0   Protein, ur NEGATIVE  NEGATIVE mg/dL   Urobilinogen, UA 0.2  0.0 - 1.0 mg/dL   Nitrite NEGATIVE  NEGATIVE   Leukocytes, UA NEGATIVE  NEGATIVE  WET PREP, GENITAL      Component Value Range   Yeast Wet Prep HPF POC NONE SEEN  NONE SEEN   Trich, Wet Prep NONE SEEN  NONE SEEN   Clue Cells Wet Prep HPF POC MANY (*) NONE SEEN   WBC, Wet Prep HPF POC NONE SEEN  NONE SEEN     Assessment:  The encounter diagnosis was Bacterial vaginosis.  Plan:   1.  The following meds were prescribed:   New Prescriptions   METRONIDAZOLE (FLAGYL) 500 MG TABLET    Take 1 tablet (500 mg total) by mouth 2 (two) times daily.   2.  The patient was instructed in symptomatic care and handouts were given. 3.  The patient was told to return if becoming worse in any way, if no better in 3 or 4 days, and given some red flag symptoms that would indicate earlier return.  Reuben Likes, MD 08/08/12 [phone]

## 2012-08-08 NOTE — ED Notes (Signed)
Pt  Reports  Symptoms  Of  A  Vaginal  Discharge   With  Foul  Odor   As  Well  As  Low  abd  Pain      X  2  Days   She  Walks  Upright  With a  Steady  Fluid  Gait     -  She  Appears  In no  Severe  Distress       Her  Skin is  Warm and  Dry       And  She is  Sitting  Upright on  Exam table  Speaking  In  Complete  sentances

## 2012-08-09 LAB — GC/CHLAMYDIA PROBE AMP, GENITAL
Chlamydia, DNA Probe: NEGATIVE
GC Probe Amp, Genital: NEGATIVE

## 2012-08-10 NOTE — ED Notes (Signed)
GC/Chlamydia neg., Wet prep: many clue cells.  Pt. adequately treated with Flagyl. Vassie Moselle 08/10/2012

## 2013-07-10 ENCOUNTER — Inpatient Hospital Stay (HOSPITAL_COMMUNITY)
Admission: AD | Admit: 2013-07-10 | Discharge: 2013-07-10 | Disposition: A | Discharge status: Home / Self Care | Payer: Self-pay | Source: Ambulatory Visit | Attending: Obstetrics & Gynecology | Admitting: Obstetrics & Gynecology

## 2013-07-10 ENCOUNTER — Encounter (HOSPITAL_COMMUNITY): Payer: Self-pay | Admitting: Family

## 2013-07-10 DIAGNOSIS — N949 Unspecified condition associated with female genital organs and menstrual cycle: Secondary | ICD-10-CM

## 2013-07-10 DIAGNOSIS — N898 Other specified noninflammatory disorders of vagina: Secondary | ICD-10-CM

## 2013-07-10 DIAGNOSIS — N760 Acute vaginitis: Secondary | ICD-10-CM | POA: Insufficient documentation

## 2013-07-10 DIAGNOSIS — A499 Bacterial infection, unspecified: Secondary | ICD-10-CM | POA: Insufficient documentation

## 2013-07-10 DIAGNOSIS — B9689 Other specified bacterial agents as the cause of diseases classified elsewhere: Secondary | ICD-10-CM | POA: Insufficient documentation

## 2013-07-10 LAB — URINALYSIS, ROUTINE W REFLEX MICROSCOPIC
Glucose, UA: NEGATIVE mg/dL
Hgb urine dipstick: NEGATIVE
Leukocytes, UA: NEGATIVE
Specific Gravity, Urine: 1.025 (ref 1.005–1.030)

## 2013-07-10 LAB — POCT PREGNANCY, URINE: Preg Test, Ur: NEGATIVE

## 2013-07-10 LAB — WET PREP, GENITAL: Yeast Wet Prep HPF POC: NONE SEEN

## 2013-07-10 MED ORDER — METRONIDAZOLE 500 MG PO TABS: 500.0000 mg | ORAL_TABLET | Freq: Two times a day (BID) | ORAL | Status: DC

## 2013-07-10 NOTE — MAU Provider Note (Signed)
History     CSN: [id number]  Arrival date and time: 07/10/13 1532   First Provider Initiated Contact with Patient 07/10/13 1615      Chief Complaint  Patient presents with  . Possible Pregnancy  . Vaginal Discharge   HPI Natalie Fischer is 32 y.o. G1P0010 presented with vaginal odor and discharge "cottage cheese".  Treated with OTC monistat X 3 days.  Cleared for a few days but after intercourse last night was back.  LMP 06/23/13--but this cycle was "on and off" not regular cycle for her.  Denies abdominal, vaginal pain or UTI sxs.  Trying to conceive. Not sure where she will go for prenatal when needed.  Is trying to get disability for bipolar but plans to be on husbands insurance for pregnancy.     Past Medical History  Diagnosis Date  . GERD (gastroesophageal reflux disease)     Past Surgical History  Procedure Laterality Date  . Ectopic pregnancy surgery      History reviewed. No pertinent family history.  History  Substance Use Topics  . Smoking status: Current Some Day Smoker -- 0.25 packs/day  . Smokeless tobacco: Not on file  . Alcohol Use: Yes    Allergies: No Known Allergies  Prescriptions prior to admission  Medication Sig Dispense Refill  . asenapine (SAPHRIS) 5 MG SUBL Place 5 mg under the tongue at bedtime.      . lansoprazole (PREVACID) 15 MG capsule Take 15 mg by mouth daily.      . metroNIDAZOLE (FLAGYL) 500 MG tablet Take 1 tablet (500 mg total) by mouth 2 (two) times daily.  14 tablet  0  . sertraline (ZOLOFT) 100 MG tablet Take 100 mg by mouth 2 (two) times daily.        Review of Systems  Constitutional: Negative for fever and chills.  Gastrointestinal: Negative for nausea, vomiting and abdominal pain.  Genitourinary: Negative for dysuria, urgency, frequency and hematuria.       + for vaginal discharge and odor   Physical Exam   Blood pressure 107/67, pulse 84, temperature 98.5 F (36.9 C), temperature source Oral, resp. rate 16, height 5'  11" (1.803 m), weight 122 lb 3.2 oz (55.43 kg), last menstrual period 06/23/2013, SpO2 100.00%.  Physical Exam  Constitutional: She is oriented to person, place, and time. She appears well-developed and well-nourished. No distress.  HENT:  Head: Normocephalic.  Neck: Normal range of motion.  Cardiovascular: Normal rate.   Respiratory: Effort normal.  GI: Soft. She exhibits no distension and no mass. There is no tenderness. There is no rebound and no guarding.  Genitourinary: Uterus is not enlarged and not tender. Cervix exhibits no motion tenderness, no discharge and no friability. Right adnexum displays no mass, no tenderness and no fullness. Left adnexum displays no mass, no tenderness and no fullness. No tenderness or bleeding around the vagina. Vaginal discharge (white discharge with odor) found.  Neurological: She is alert and oriented to person, place, and time.  Skin: Skin is warm and dry.  Psychiatric: She has a normal mood and affect. Her behavior is normal.   Results for orders placed during the hospital encounter of 07/10/13 (from the past 24 hour(s))  URINALYSIS, ROUTINE W REFLEX MICROSCOPIC     Status: Abnormal   Collection Time    07/10/13  3:50 PM      Result Value Range   Color, Urine YELLOW  YELLOW   APPearance HAZY (*) CLEAR   Specific Gravity, Urine  1.025  1.005 - 1.030   pH 6.0  5.0 - 8.0   Glucose, UA NEGATIVE  NEGATIVE mg/dL   Hgb urine dipstick NEGATIVE  NEGATIVE   Bilirubin Urine NEGATIVE  NEGATIVE   Ketones, ur NEGATIVE  NEGATIVE mg/dL   Protein, ur NEGATIVE  NEGATIVE mg/dL   Urobilinogen, UA 1.0  0.0 - 1.0 mg/dL   Nitrite NEGATIVE  NEGATIVE   Leukocytes, UA NEGATIVE  NEGATIVE  POCT PREGNANCY, URINE     Status: None   Collection Time    07/10/13  4:06 PM      Result Value Range   Preg Test, Ur NEGATIVE  NEGATIVE  WET PREP, GENITAL     Status: Abnormal   Collection Time    07/10/13  4:25 PM      Result Value Range   Yeast Wet Prep HPF POC NONE SEEN   NONE SEEN   Trich, Wet Prep NONE SEEN  NONE SEEN   Clue Cells Wet Prep HPF POC MODERATE (*) NONE SEEN   WBC, Wet Prep HPF POC FEW (*) NONE SEEN   MAU Course  Procedures  GC/CHL culture to lab  MDM  Assessment and Plan  A:  Vaginal discharge and odor      Bacterial vaginosis  P:  Rx for Flagyl to pharmacy       ETOH warning      Pelvic rest X 1 week        Jonas Goh,EVE M 07/10/2013, 4:16 PM

## 2013-07-10 NOTE — MAU Note (Signed)
32 yo, G1P0, presenting to MAU with c/o malodorous vaginal discharge since last night; reports taking Monistat 3-day treatment from last Tues-Thurs for what she believed to be a yeast infection.  Patient reports she also had yeast infection last month. LMP 06/23/13.

## 2013-07-10 NOTE — MAU Note (Signed)
Patient states she has had a vaginal discharge for about one week but now has an odor. Has taken OTC Monistat but now has vaginal irritation.

## 2013-07-11 NOTE — MAU Provider Note (Signed)
Attestation of Attending Supervision of Advanced Practitioner (PA/CNM/NP): Evaluation and management procedures were performed by the Advanced Practitioner under my supervision and collaboration.  I have reviewed the Advanced Practitioner's note and chart, and I agree with the management and plan.  Lakeithia Rasor, MD, FACOG Attending Obstetrician & Gynecologist Faculty Practice, [facility]  

## 2013-08-29 ENCOUNTER — Encounter (HOSPITAL_COMMUNITY): Payer: Self-pay | Admitting: Advanced Practice Midwife

## 2013-08-29 ENCOUNTER — Inpatient Hospital Stay (HOSPITAL_COMMUNITY): Payer: Self-pay

## 2013-08-29 ENCOUNTER — Inpatient Hospital Stay (HOSPITAL_COMMUNITY)
Admission: AD | Admit: 2013-08-29 | Discharge: 2013-08-29 | Disposition: A | Discharge status: Home / Self Care | Payer: Self-pay | Source: Ambulatory Visit | Attending: Obstetrics and Gynecology | Admitting: Obstetrics and Gynecology

## 2013-08-29 DIAGNOSIS — O2390 Unspecified genitourinary tract infection in pregnancy, unspecified trimester: Secondary | ICD-10-CM | POA: Insufficient documentation

## 2013-08-29 DIAGNOSIS — N760 Acute vaginitis: Secondary | ICD-10-CM | POA: Insufficient documentation

## 2013-08-29 DIAGNOSIS — B9689 Other specified bacterial agents as the cause of diseases classified elsewhere: Secondary | ICD-10-CM | POA: Insufficient documentation

## 2013-08-29 DIAGNOSIS — O99891 Other specified diseases and conditions complicating pregnancy: Secondary | ICD-10-CM | POA: Insufficient documentation

## 2013-08-29 DIAGNOSIS — A499 Bacterial infection, unspecified: Secondary | ICD-10-CM

## 2013-08-29 DIAGNOSIS — Z3201 Encounter for pregnancy test, result positive: Secondary | ICD-10-CM

## 2013-08-29 LAB — CBC
HCT: 37.3 % (ref 36.0–46.0)
Hemoglobin: 12.4 g/dL (ref 12.0–15.0)
MCH: 28.8 pg (ref 26.0–34.0)
MCHC: 33.2 g/dL (ref 30.0–36.0)
MCV: 86.5 fL (ref 78.0–100.0)
Platelets: 182 K/uL (ref 150–400)
RBC: 4.31 MIL/uL (ref 3.87–5.11)
RDW: 14.1 % (ref 11.5–15.5)
WBC: 3.8 K/uL — ABNORMAL LOW (ref 4.0–10.5)

## 2013-08-29 LAB — URINALYSIS, ROUTINE W REFLEX MICROSCOPIC
Bilirubin Urine: NEGATIVE
Glucose, UA: NEGATIVE mg/dL
Hgb urine dipstick: NEGATIVE
Ketones, ur: NEGATIVE mg/dL
Leukocytes, UA: NEGATIVE
Nitrite: NEGATIVE
Protein, ur: NEGATIVE mg/dL
Specific Gravity, Urine: >1.03 — ABNORMAL HIGH (ref 1.005–1.030)
Urobilinogen, UA: 0.2 mg/dL (ref 0.0–1.0)
pH: 6 (ref 5.0–8.0)

## 2013-08-29 LAB — WET PREP, GENITAL
Clue Cells Wet Prep HPF POC: NONE SEEN
WBC, Wet Prep HPF POC: NONE SEEN
Yeast Wet Prep HPF POC: NONE SEEN

## 2013-08-29 LAB — HCG, QUANTITATIVE, PREGNANCY: hCG, Beta Chain, Quant, S: 1387 m[IU]/mL — ABNORMAL HIGH (ref <5)

## 2013-08-29 LAB — POCT PREGNANCY, URINE: Preg Test, Ur: POSITIVE — AB

## 2013-08-29 MED ORDER — METRONIDAZOLE 500 MG PO TABS: 500.0000 mg | ORAL_TABLET | Freq: Two times a day (BID) | ORAL | Status: AC

## 2013-08-29 NOTE — MAU Note (Signed)
Pt reports she has taken a few pregnancy test at home and they are positive. Pt has had an ectopic before nd is worried. Denies and pain or bleeding.

## 2013-08-29 NOTE — MAU Provider Note (Signed)
Chief Complaint: Possible Pregnancy   None    SUBJECTIVE HPI: Natalie Fischer is a 32 y.o. G1P0010 at [redacted]w[redacted]d by LMP who presents to maternity admissions reporting positive pregnancy test at home and last normal menstrual period 07/17/13 with some light bleeding 4 days ago.  She has hx of ectopic pregnancy with surgery, but reports she did not have her tube removed.  She denies abdominal pain, vaginal itching/burning, urinary symptoms, h/a, dizziness, n/v, or fever/chills.     Past Medical History  Diagnosis Date  . GERD (gastroesophageal reflux disease)    Past Surgical History  Procedure Laterality Date  . Ectopic pregnancy surgery     History   Social History  . Marital Status: Single    Spouse Name: N/A    Number of Children: N/A  . Years of Education: N/A   Occupational History  . Not on file.   Social History Main Topics  . Smoking status: Current Some Day Smoker -- 0.25 packs/day  . Smokeless tobacco: Not on file  . Alcohol Use: Yes  . Drug Use: No  . Sexual Activity: Yes    Birth Control/ Protection: Condom   Other Topics Concern  . Not on file   Social History Narrative  . No narrative on file   No current facility-administered medications on file prior to encounter.   Current Outpatient Prescriptions on File Prior to Encounter  Medication Sig Dispense Refill  . asenapine (SAPHRIS) 5 MG SUBL Place 5 mg under the tongue at bedtime.      . gabapentin (NEURONTIN) 100 MG capsule Take 100 mg by mouth 3 (three) times daily.      . metroNIDAZOLE (FLAGYL) 500 MG tablet Take 1 tablet (500 mg total) by mouth 2 (two) times daily.  14 tablet  0   No Known Allergies  ROS: Pertinent items in HPI  OBJECTIVE Blood pressure 124/89, pulse 91, temperature 98.5 F (36.9 C), temperature source Oral, resp. rate 18, height 5\' 11"  (1.803 m), weight 56.518 kg (124 lb 9.6 oz). GENERAL: Well-developed, well-nourished female in no acute distress.  HEENT: Normocephalic HEART:  normal rate RESP: normal effort ABDOMEN: Soft, non-tender EXTREMITIES: Nontender, no edema NEURO: Alert and oriented Chief Complaint: Possible Pregnancy  LAB RESULTS Results for orders placed during the hospital encounter of 08/29/13 (from the past 24 hour(s))  URINALYSIS, ROUTINE W REFLEX MICROSCOPIC     Status: Abnormal   Collection Time    08/29/13  2:30 PM      Result Value Range   Color, Urine YELLOW  YELLOW   APPearance HAZY (*) CLEAR   Specific Gravity, Urine >1.030 (*) 1.005 - 1.030   pH 6.0  5.0 - 8.0   Glucose, UA NEGATIVE  NEGATIVE mg/dL   Hgb urine dipstick NEGATIVE  NEGATIVE   Bilirubin Urine NEGATIVE  NEGATIVE   Ketones, ur NEGATIVE  NEGATIVE mg/dL   Protein, ur NEGATIVE  NEGATIVE mg/dL   Urobilinogen, UA 0.2  0.0 - 1.0 mg/dL   Nitrite NEGATIVE  NEGATIVE   Leukocytes, UA NEGATIVE  NEGATIVE  POCT PREGNANCY, URINE     Status: Abnormal   Collection Time    08/29/13  2:33 PM      Result Value Range   Preg Test, Ur POSITIVE (*) NEGATIVE  HCG, QUANTITATIVE, PREGNANCY     Status: Abnormal   Collection Time    08/29/13  3:39 PM      Result Value Range   hCG, Beta Chain, Quant, S 1387 (*) <  5 mIU/mL  CBC     Status: Abnormal   Collection Time    08/29/13  3:39 PM      Result Value Range   WBC 3.8 (*) 4.0 - 10.5 K/uL   RBC 4.31  3.87 - 5.11 MIL/uL   Hemoglobin 12.4  12.0 - 15.0 g/dL   HCT 46.9  62.9 - 52.8 %   MCV 86.5  78.0 - 100.0 fL   MCH 28.8  26.0 - 34.0 pg   MCHC 33.2  30.0 - 36.0 g/dL   RDW 41.3  24.4 - 01.0 %   Platelets 182  150 - 400 K/uL  WET PREP, GENITAL     Status: None   Collection Time    08/29/13  3:45 PM      Result Value Range   Yeast Wet Prep HPF POC NONE SEEN  NONE SEEN   Trich, Wet Prep NONE SEEN  NONE SEEN   Clue Cells Wet Prep HPF POC NONE SEEN  NONE SEEN   WBC, Wet Prep HPF POC NONE SEEN  NONE SEEN    IMAGING US Ob Comp Less 14 Wks  08/29/2013   CLINICAL DATA:  Vaginal bleeding. Estimated gestational age by LMP is 6 weeks 1 day.   EXAM: OBSTETRIC <14 WK ULTRASOUND  TECHNIQUE: Transabdominal ultrasound was performed for evaluation of the gestation as well as the maternal uterus and adnexal regions.  COMPARISON:  None.  FINDINGS: Intrauterine gestational sac: None visualized  Yolk sac:  None visualized  Embryo:  None visualized  Cardiac Activity:  Heart Rate:  bpm  Maternal uterus/adnexae: Right ovary appears normal. Probable corpus luteum in the right ovary. No right adnexal mass is seen.  There is a heterogeneous, amorphous area in the left adnexa immediately adjacent to the ovary which demonstrates vascular flow on power Doppler flow and measures approximately 2.0 x 1.7 cm on sagittal image number 74 of the 1st series of images. An ectopic pregnancy cannot be excluded, but is not definite. Alternatively, this could be a portion of the left ovary or left adnexal bowel loops. The sonographer reports the patient does experience pain with transvaginal imaging of the left adnexa.  No free fluid is identified.  IMPRESSION: 1. No intrauterine pregnancy is identified. Ectopic pregnancy cannot be excluded. There is an amorphous heterogeneous area in the left adnexa immediately adjacent to the ovary for which an ectopic pregnancy cannot be excluded; alternatively, this could reflect bowel or a portion of the ovary. Suggest correlation with serial quantitative beta HCG levels. This finding was telephoned to Sharen Counter at 5:57 pm 08/29/2013.  2. The patient experiences pain with transvaginal imaging of the left adnexa region.   Electronically Signed   By: Britta Mccreedy M.D.   On: 08/29/2013 17:58    ASSESSMENT 1. Bacterial vaginosis   2. Positive blood pregnancy test     PLAN Discharge home with ectopic precautions.  Pt denies pain at time of discharge.  Return to MAU in 48 hours for repeat quant hcg Flagyl 500 mg BID x7 days Return to MAU sooner as needed    Medication List         gabapentin 100 MG capsule  Commonly known  as:  NEURONTIN  Take 100 mg by mouth 3 (three) times daily.     metroNIDAZOLE 500 MG tablet  Commonly known as:  FLAGYL  Take 1 tablet (500 mg total) by mouth 2 (two) times daily.     prenatal multivitamin Tabs tablet  Take 1 tablet by mouth daily at 12 noon.     SAPHRIS 5 MG Subl 24 hr tablet  Generic drug:  asenapine  Place 5 mg under the tongue at bedtime.           Follow-up Information   Follow up with [facility]. (Return to MAU Saturday after 3 pm for repeat labs.  Return to MAU sooner as needed.)    Contact information:   [address] [id number] [place] [address] [phone]      Sharen Counter Certified Nurse-Midwife 08/29/2013  6:12 PM

## 2013-08-29 NOTE — MAU Note (Signed)
Pt states she had some vaginal bleeding 08/18/2013. Patient states she is having cramping and pressure.

## 2013-08-29 NOTE — Progress Notes (Signed)
Pt states 'I am ok now"

## 2013-08-30 LAB — GC/CHLAMYDIA PROBE AMP: CT Probe RNA: NEGATIVE

## 2013-08-30 NOTE — MAU Provider Note (Signed)
Attestation of Attending Supervision of Advanced Practitioner (CNM/NP): Evaluation and management procedures were performed by the Advanced Practitioner under my supervision and collaboration.  I have reviewed the Advanced Practitioner's note and chart, and I agree with the management and plan.  Kinjal Neitzke 08/30/2013 5:44 AM

## 2013-08-31 ENCOUNTER — Inpatient Hospital Stay (HOSPITAL_COMMUNITY)
Admission: AD | Admit: 2013-08-31 | Discharge: 2013-08-31 | Disposition: A | Discharge status: Home / Self Care | Payer: Self-pay | Source: Ambulatory Visit | Attending: Obstetrics & Gynecology | Admitting: Obstetrics & Gynecology

## 2013-08-31 DIAGNOSIS — O209 Hemorrhage in early pregnancy, unspecified: Secondary | ICD-10-CM | POA: Insufficient documentation

## 2013-08-31 NOTE — MAU Note (Signed)
32 yo, G2P0 at [redacted]w[redacted]d, presents to MAU for follow-up HCG labs. Denies pain at this time; no VB.

## 2013-08-31 NOTE — MAU Provider Note (Signed)
CC: F/U quant  SUBJECTIVE: HPI: Natalie Fischer is a 32 yo G2P0010 at [redacted]w[redacted]d by LNMP who was here 2 days ago reporting positive HPT and some bleeding for 3-4 days starting 08/18/2013. She now states that may have been her LMP rather than 07/17/2013 as stated. She's had no further bleeding and has had no abdominal or pelvic pain.  Quantiatative beta hCG was 1387 and US showed no IUP, cannot r/o ectopic  History is significant for previous ectopic. Pregnancy is desired.  Past Medical History  Diagnosis Date  . GERD (gastroesophageal reflux disease)      Past Surgical History  Procedure Laterality Date  . Ectopic pregnancy surgery     History   Social History  . Marital Status: Single    Spouse Name: N/A    Number of Children: N/A  . Years of Education: N/A   Social History Main Topics  . Smoking status: Current Some Day Smoker -- 0.25 packs/day  . Smokeless tobacco: Not on file  . Alcohol Use: Yes  . Drug Use: No  . Sexual Activity: Yes    Birth Control/ Protection: Condom   Other Topics Concern  . Not on file   Social History Narrative  . No narrative on file    OBJECTIVE: Filed Vitals:   08/31/13 1642  BP: 126/68  Pulse: 94  Resp: 18   Results for orders placed during the hospital encounter of 08/31/13 (from the past 72 hour(s))  HCG, QUANTITATIVE, PREGNANCY     Status: Abnormal   Collection Time    08/31/13  4:57 PM      Result Value Range   hCG, Beta Chain, Quant, S 2081 (*) <5 mIU/mL   Comment:              GEST. AGE      CONC.  (mIU/mL)       <=1 WEEK        5 - 50         2 WEEKS       50 - 500         3 WEEKS       100 - 10,000         4 WEEKS     1,000 - 30,000         5 WEEKS     3,500 - 115,000       6-8 WEEKS     12,000 - 270,000        12 WEEKS     15,000 - 220,000                FEMALE AND NON-PREGNANT FEMALE:         LESS THAN 5 mIU/mL  08/29/2013 CLINICAL DATA: Vaginal bleeding. Estimated gestational age by LMP is 6 weeks 1 day. EXAM:  OBSTETRIC <14 WK ULTRASOUND TECHNIQUE: Transabdominal ultrasound was performed for evaluation of the gestation as well as the maternal uterus and adnexal regions. COMPARISON: None. FINDINGS: Intrauterine gestational sac: None visualized Yolk sac: None visualized Embryo: None visualized Cardiac Activity: Heart Rate: bpm Maternal uterus/adnexae: Right ovary appears normal. Probable corpus luteum in the right ovary. No right adnexal mass is seen. There is a heterogeneous, amorphous area in the left adnexa immediately adjacent to the ovary which demonstrates vascular flow on power Doppler flow and measures approximately 2.0 x 1.7 cm on sagittal image number 74 of the 1st series of images. An ectopic pregnancy cannot be excluded, but is not  definite. Alternatively, this could be a portion of the left ovary or left adnexal bowel loops. The sonographer reports the patient does experience pain with transvaginal imaging of the left adnexa. No free fluid is identified. IMPRESSION: 1. No intrauterine pregnancy is identified. Ectopic pregnancy cannot be excluded. There is an amorphous heterogeneous area in the left adnexa immediately adjacent to the ovary for which an ectopic pregnancy cannot be excluded; alternatively, this could reflect bowel or a portion of the ovary. Suggest correlation with serial quantitative beta HCG levels. This finding was telephoned to Sharen Counter at 5:57 pm 08/29/2013. 2. The patient experiences pain with transvaginal imaging of the left adnexa region. Electronically Signed By: Britta Mccreedy M.D. On: 08/29/2013 17:58   ASSESSMENT: G2P0010 at 6.3 wks Abnormal rise in quant and bleeding 2 wks ago  PLAN: D/W Dr. Debroah Loop  Home with ectopic precautions and F/U quant in 2 days.   Medication List         gabapentin 100 MG capsule  Commonly known as:  NEURONTIN  Take 100 mg by mouth 3 (three) times daily.     metroNIDAZOLE 500 MG tablet  Commonly known as:  FLAGYL  Take 1 tablet (500  mg total) by mouth 2 (two) times daily.     prenatal multivitamin Tabs tablet  Take 1 tablet by mouth daily at 12 noon.     SAPHRIS 5 MG Subl 24 hr tablet  Generic drug:  asenapine  Place 5 mg under the tongue at bedtime.       Follow-up Information   Follow up with [facility] [place] [facility] In 2 days. (Repeat beta hCG)    Contact information:   [address] [id number] [place] [address] [phone]

## 2013-09-02 ENCOUNTER — Inpatient Hospital Stay (HOSPITAL_COMMUNITY)
Admission: AD | Admit: 2013-09-02 | Discharge: 2013-09-02 | Disposition: A | Discharge status: Home / Self Care | Payer: Self-pay | Source: Ambulatory Visit | Attending: Obstetrics & Gynecology | Admitting: Obstetrics & Gynecology

## 2013-09-02 ENCOUNTER — Inpatient Hospital Stay (HOSPITAL_COMMUNITY): Payer: Self-pay

## 2013-09-02 DIAGNOSIS — N9489 Other specified conditions associated with female genital organs and menstrual cycle: Secondary | ICD-10-CM | POA: Insufficient documentation

## 2013-09-02 DIAGNOSIS — R109 Unspecified abdominal pain: Secondary | ICD-10-CM | POA: Insufficient documentation

## 2013-09-02 DIAGNOSIS — O0281 Inappropriate change in quantitative human chorionic gonadotropin (hCG) in early pregnancy: Secondary | ICD-10-CM | POA: Insufficient documentation

## 2013-09-02 LAB — CBC WITH DIFFERENTIAL/PLATELET
Basophils Absolute: 0 10*3/uL (ref 0.0–0.1)
Eosinophils Relative: 2 % (ref 0–5)
Hemoglobin: 12.3 g/dL (ref 12.0–15.0)
Lymphocytes Relative: 40 % (ref 12–46)
Lymphs Abs: 1.9 10*3/uL (ref 0.7–4.0)
Monocytes Relative: 13 % — ABNORMAL HIGH (ref 3–12)
Neutro Abs: 2.1 10*3/uL (ref 1.7–7.7)
Neutrophils Relative %: 44 % (ref 43–77)
Platelets: 175 10*3/uL (ref 150–400)
RBC: 4.3 MIL/uL (ref 3.87–5.11)
WBC: 4.6 10*3/uL (ref 4.0–10.5)

## 2013-09-02 LAB — CREATININE, SERUM
Creatinine, Ser: 0.76 mg/dL (ref 0.50–1.10)
GFR calc Af Amer: >90 mL/min (ref >90)

## 2013-09-02 LAB — AST: AST: 18 U/L (ref 0–37)

## 2013-09-02 LAB — HCG, QUANTITATIVE, PREGNANCY: hCG, Beta Chain, Quant, S: 3291 m[IU]/mL — ABNORMAL HIGH (ref <5)

## 2013-09-02 LAB — BUN: BUN: 6 mg/dL (ref 6–23)

## 2013-09-02 MED ORDER — METHOTREXATE INJECTION FOR WOMEN'S HOSPITAL
50.0000 mg/m2 | Freq: Once | INTRAMUSCULAR | Status: AC
  Administered 2013-09-02: 85 mg via INTRAMUSCULAR
  Filled 2013-09-02: qty 1.7

## 2013-09-02 NOTE — MAU Note (Signed)
Having lower abd pain today since Saturday, denies bleeding.

## 2013-09-02 NOTE — MAU Provider Note (Signed)
HPI:  [name] is a 32 y.o. female whop presents for a follow up beta hcg. She is having mild lower abdominal pain that comes and goes. She is not currently having pain at this time. She denies vaginal bleeding.    Objective:   GENERAL: Well-developed, well-nourished female in no acute distress.  HEENT: Normocephalic, atraumatic.   LUNGS: Effort normal HEART: Regular rate  SKIN: Warm, dry and without erythema PSYCH: Normal mood and affect  Filed Vitals:   09/02/13 1212  BP: 115/56  Pulse: 78  Temp: 98.1 F (36.7 C)  TempSrc: Oral  Resp: 20    US Ob Transvaginal  09/02/2013   CLINICAL DATA:  Pelvic pain. Quantitative beta HCG today is 3,291. On 08/31/2013, quantitative beta HCG was 2,081.  EXAM: TRANSVAGINAL OB ULTRASOUND  TECHNIQUE: Transvaginal ultrasound was performed for complete evaluation of the gestation as well as the maternal uterus, adnexal regions, and pelvic cul-de-sac.  COMPARISON:  08/29/2013  FINDINGS: Intrauterine gestational sac: Not seen  Yolk sac:  Not seen  Embryo:  Not seen  Cardiac Activity: Not seen  Maternal uterus/adnexae: Within the right adnexal region, inferior to the right ovary, there is a small soft tissue mass with ring-like appearance. This measures 1.7 x 1.1 x 1.0 cm. Within the left adnexa, there are several cystic structures, all which appear to be related to the ovary on today's exam. No definite discrete mass identified in the left adnexa. The patient is tender while scanning of both adnexal regions.  Limited evaluation of the right adnexa was possible. At the request of the patient's significant other, the exam was terminated because of patient's pain.  IMPRESSION: 1. No evidence for intrauterine pregnancy. Given the quantitative beta HCG values, findings are suspicious for ectopic pregnancy. 2. The most likely site of the ectopic pregnancy is favored to be the right adnexal region. 3. The findings in the left adnexa are favored to represent  intra ovarian cysts on today's exam. 4. Critical Value/emergent results were called by telephone at the time of interpretation on 09/02/2013 at 3:59 PM to [facility] , who verbally acknowledged these results.   Electronically Signed   By: Rosalie Gums M.D.   On: 09/02/2013 16:00    Beta Hcg Levels  11/6: 1387 11/8: 2081 11/10: 3291  Consulted with Dr. Marice Potter; plan to repeat US today.  Consulted with Dr. Marice Potter regarding [place] report. Dr. Marice Potter would like the patient to be offered methotrexate based on patients beta hcg levels and [place] report.  I Discussed with the patient and her significant other the recommendation by Dr. Marice Potter that the patient receive methotrexate today for a right adnexal mass. I discussed at length the risk of waiting 48 hours to recheck a beta hcg level up to and including death. The patient and her significant other agree that methotrexate is the best treatment option at this time. I pulled up the [place] report and went over it in detail with them both and answered all questions.  The patient would like to know if there is testing available since this is the second ectopic she has experienced. I informed her that she would follow up in the clinic and that is something she can discuss at her follow up.   Results for orders placed during the hospital encounter of 09/02/13 (from the past 24 hour(s))  HCG, QUANTITATIVE, PREGNANCY     Status: Abnormal   Collection Time    09/02/13 12:21 PM  Result Value Range   hCG, Beta Chain, Quant, S 3291 (*) <5 mIU/mL  CBC WITH DIFFERENTIAL     Status: Abnormal   Collection Time    09/02/13  4:40 PM      Result Value Range   WBC 4.6  4.0 - 10.5 K/uL   RBC 4.30  3.87 - 5.11 MIL/uL   Hemoglobin 12.3  12.0 - 15.0 g/dL   HCT 46.9  62.9 - 52.8 %   MCV 86.5  78.0 - 100.0 fL   MCH 28.6  26.0 - 34.0 pg   MCHC 33.1  30.0 - 36.0 g/dL   RDW 41.3  24.4 - 01.0 %   Platelets 175  150 - 400 K/uL   Neutrophils Relative % 44  43 - 77 %   Neutro Abs 2.1   1.7 - 7.7 K/uL   Lymphocytes Relative 40  12 - 46 %   Lymphs Abs 1.9  0.7 - 4.0 K/uL   Monocytes Relative 13 (*) 3 - 12 %   Monocytes Absolute 0.6  0.1 - 1.0 K/uL   Eosinophils Relative 2  0 - 5 %   Eosinophils Absolute 0.1  0.0 - 0.7 K/uL   Basophils Relative 0  0 - 1 %   Basophils Absolute 0.0  0.0 - 0.1 K/uL  AST     Status: None   Collection Time    09/02/13  4:40 PM      Result Value Range   AST 18  0 - 37 U/L  BUN     Status: None   Collection Time    09/02/13  4:40 PM      Result Value Range   BUN 6  6 - 23 mg/dL  CREATININE, SERUM     Status: None   Collection Time    09/02/13  4:40 PM      Result Value Range   Creatinine, Ser 0.76  0.50 - 1.10 mg/dL   GFR calc non Af Amer >90  >90 mL/min   GFR calc Af Amer >90  >90 mL/min     A: Right adnexal mass suspicious for ectopic pregnancy Inappropriate rise in Beta hcg levels  P: Pt received methotrexate in MAU  Discharge home Return on 11/13 for day 4 beta hcg level Pelvic rest discussed Return to MAU if pain/bleeding worsens No ibuprofen; ok to take tylenol as directed on the bottle  Support given  Iona Hansen Rasch, NP 09/02/2013 6:24 PM

## 2013-09-12 ENCOUNTER — Inpatient Hospital Stay (HOSPITAL_COMMUNITY)
Admission: AD | Admit: 2013-09-12 | Discharge: 2013-09-12 | Disposition: A | Discharge status: Home / Self Care | Payer: Self-pay | Source: Ambulatory Visit | Attending: Obstetrics & Gynecology | Admitting: Obstetrics & Gynecology

## 2013-09-12 ENCOUNTER — Inpatient Hospital Stay (HOSPITAL_COMMUNITY): Payer: Self-pay

## 2013-09-12 DIAGNOSIS — O00109 Unspecified tubal pregnancy without intrauterine pregnancy: Secondary | ICD-10-CM | POA: Insufficient documentation

## 2013-09-12 DIAGNOSIS — O009 Unspecified ectopic pregnancy without intrauterine pregnancy: Secondary | ICD-10-CM

## 2013-09-12 LAB — CBC WITH DIFFERENTIAL/PLATELET
Basophils Absolute: 0 10*3/uL (ref 0.0–0.1)
Basophils Relative: 0 % (ref 0–1)
Eosinophils Absolute: 0.1 10*3/uL (ref 0.0–0.7)
HCT: 34.2 % — ABNORMAL LOW (ref 36.0–46.0)
Hemoglobin: 11.3 g/dL — ABNORMAL LOW (ref 12.0–15.0)
Lymphocytes Relative: 34 % (ref 12–46)
MCH: 29 pg (ref 26.0–34.0)
MCHC: 33 g/dL (ref 30.0–36.0)
MCV: 87.7 fL (ref 78.0–100.0)
Monocytes Absolute: 0.6 10*3/uL (ref 0.1–1.0)
Monocytes Relative: 10 % (ref 3–12)
Neutro Abs: 3.1 10*3/uL (ref 1.7–7.7)
Neutrophils Relative %: 54 % (ref 43–77)
RBC: 3.9 MIL/uL (ref 3.87–5.11)
RDW: 14 % (ref 11.5–15.5)

## 2013-09-12 LAB — HCG, QUANTITATIVE, PREGNANCY: hCG, Beta Chain, Quant, S: 5040 m[IU]/mL — ABNORMAL HIGH (ref <5)

## 2013-09-12 LAB — CREATININE, SERUM
Creatinine, Ser: 0.75 mg/dL (ref 0.50–1.10)
GFR calc Af Amer: >90 mL/min (ref >90)

## 2013-09-12 MED ORDER — METHOTREXATE INJECTION FOR WOMEN'S HOSPITAL
50.0000 mg/m2 | Freq: Once | INTRAMUSCULAR | Status: AC
  Administered 2013-09-12: 85 mg via INTRAMUSCULAR
  Filled 2013-09-12: qty 1.7

## 2013-09-12 NOTE — MAU Note (Signed)
Explained to patient that she had not been forgotten in the waiting room but that the results of her liver function lab draw are still pending and that she will receive her injection as soon as the labs result. Patient verbalized understanding. Lab called and informed that patient has been waiting for labs to result X 7 hours. Lab stated that blood went to [facility] to be resulted and that they will gladly restick and run the labs here if the patient would prefer.

## 2013-09-12 NOTE — MAU Note (Signed)
Pt had MTx injection on 11/10. Never came back for f/u labs. Has not had any pain since. Stated she has stared her period. Flow is heavy. Mild cramping like a normal period. Relieved with tylenol.

## 2013-09-12 NOTE — MAU Provider Note (Signed)
[name] is a 32 y.o. G2P0010 at [redacted]w[redacted]d who presents to MAU today for follow-up labs after MTX. The patient was diagnosed with an ectopic pregnancy on 09/02/13. She was given MTX that day and did not follow-up for day 4 or 7 labs. The patient presents today stating that she has had some mild cramping and light bleeding today, but no pain prior to today.   BP 114/71  Pulse 77  Resp 18  Ht 5\' 11"  (1.803 m)  Wt 126 lb 9.6 oz (57.425 kg)  BMI 17.66 kg/m2  LMP 07/17/2013 GENERAL: Well-developed, well-nourished female in no acute distress.  HEENT: Normocephalic, atraumatic.   LUNGS: Effort normal HEART: Regular rate  SKIN: Warm, dry and without erythema PSYCH: Normal mood and affect  Results for REESE, SENK (MRN [id number]) as of 09/12/2013 17:09  Ref. Range 08/29/2013 15:39 08/31/2013 16:57 09/02/2013 12:21 09/02/2013 16:40 09/12/2013 11:55  hCG, Beta Chain, Quant, S Latest Range: <5 mIU/mL 1387 (H) 2081 (H) 3291 (H)  5040 (H)   US Ob Transvaginal  09/12/2013   CLINICAL DATA:  post methotrexate for possible right ectopic pregnancy.  EXAM: TRANSVAGINAL OB ULTRASOUND  TECHNIQUE: Transvaginal ultrasound was performed for complete evaluation of the gestation as well as the maternal uterus, adnexal regions, and pelvic cul-de-sac.  COMPARISON:  09/02/2013  FINDINGS: Intrauterine gestational sac: None  Yolk sac:  Absent  Embryo:  Absent  Cardiac Activity: Absent  Heart Rate:  bpm  MSD:   mm    w     d  CRL:     mm    w  d                  [place] EDC:  Maternal uterus/adnexae: Within the right adnexa there is a mass noted measuring 2.2 x 1.7 x 1.3 cm. This has an appearance of an ectopic pregnancy with an echogenic rim and central cystic area. This is slightly larger ba compared with nd prior study when this previously measured maximally 1.7 cm. No fetal pole is visualized. No free fluid. Left adnexa unremarkable.  IMPRESSION: No intrauterine pregnancy.  Again noted is the mass in the right adnexa  inferior to the right ovary concerning for ectopic pregnancy, slightly larger than prior study, measuring maximally 2.2 cm. No fetal pole or heartbeat seen.  Critical Value/emergent results were called by telephone at the time of interpretation on 09/12/2013 at 3:11 PM to Eve in the MAU, who verbally acknowledged these results.   Electronically Signed   By: Charlett Nose M.D.   On: 09/12/2013 15:12   Results for orders placed during the hospital encounter of 09/12/13 (from the past 24 hour(s))  HCG, QUANTITATIVE, PREGNANCY     Status: Abnormal   Collection Time    09/12/13 11:55 AM      Result Value Range   hCG, Beta Chain, Quant, S 5040 (*) <5 mIU/mL  CBC WITH DIFFERENTIAL     Status: Abnormal   Collection Time    09/12/13 11:55 AM      Result Value Range   WBC 5.8  4.0 - 10.5 K/uL   RBC 3.90  3.87 - 5.11 MIL/uL   Hemoglobin 11.3 (*) 12.0 - 15.0 g/dL   HCT 08.6 (*) 57.8 - 46.9 %   MCV 87.7  78.0 - 100.0 fL   MCH 29.0  26.0 - 34.0 pg   MCHC 33.0  30.0 - 36.0 g/dL   RDW 62.9  52.8 - 41.3 %  Platelets 161  150 - 400 K/uL   Neutrophils Relative % 54  43 - 77 %   Neutro Abs 3.1  1.7 - 7.7 K/uL   Lymphocytes Relative 34  12 - 46 %   Lymphs Abs 2.0  0.7 - 4.0 K/uL   Monocytes Relative 10  3 - 12 %   Monocytes Absolute 0.6  0.1 - 1.0 K/uL   Eosinophils Relative 2  0 - 5 %   Eosinophils Absolute 0.1  0.0 - 0.7 K/uL   Basophils Relative 0  0 - 1 %   Basophils Absolute 0.0  0.0 - 0.1 K/uL  AST     Status: None   Collection Time    09/12/13 11:55 AM      Result Value Range   AST 14  0 - 37 U/L  BUN     Status: None   Collection Time    09/12/13 11:55 AM      Result Value Range   BUN 7  6 - 23 mg/dL  CREATININE, SERUM     Status: None   Collection Time    09/12/13 11:55 AM      Result Value Range   Creatinine, Ser 0.75  0.50 - 1.10 mg/dL   GFR calc non Af Amer >90  >90 mL/min   GFR calc Af Amer >90  >90 mL/min    MDM Discussed patient with Dr. Penne Lash. Patient will need another  dose of MTX today Discussed results with the patient. Informed her that another dose of MTX needed Patient voiced understanding Explained the importance of having her return on Day 4 and 7 for follow-up labs Patient voiced understanding Labs WNL MTX ordered  A: Ectoptic pregnancy s/p MTX with rising quant hCG  P: Discharge home Care after MTX information given Patient to return to MAU on Sunday for follow-up labs Patient may return to MAU as needed sooner  Freddi Starr, PA-C 09/12/2013 5:12 PM

## 2013-09-12 NOTE — MAU Note (Signed)
Patient agreed to be restuck for labs; sent to lab for redraw of blood.

## 2013-09-15 ENCOUNTER — Inpatient Hospital Stay (HOSPITAL_COMMUNITY)
Admission: AD | Admit: 2013-09-15 | Discharge: 2013-09-15 | Disposition: A | Discharge status: Home / Self Care | Payer: Self-pay | Source: Ambulatory Visit | Attending: Obstetrics & Gynecology | Admitting: Obstetrics & Gynecology

## 2013-09-15 DIAGNOSIS — O009 Unspecified ectopic pregnancy without intrauterine pregnancy: Secondary | ICD-10-CM

## 2013-09-15 DIAGNOSIS — O00109 Unspecified tubal pregnancy without intrauterine pregnancy: Secondary | ICD-10-CM | POA: Insufficient documentation

## 2013-09-15 LAB — HCG, QUANTITATIVE, PREGNANCY: hCG, Beta Chain, Quant, S: 3636 m[IU]/mL — ABNORMAL HIGH (ref <5)

## 2013-09-15 NOTE — MAU Provider Note (Signed)
Attestation of Attending Supervision of Advanced Practitioner (CNM/NP): Evaluation and management procedures were performed by the Advanced Practitioner under my supervision and collaboration. I have reviewed the Advanced Practitioner's note and chart, and I agree with the management and plan.  Maija Biggers H. 4:47 PM   

## 2013-09-15 NOTE — MAU Provider Note (Signed)
Attestation of Attending Supervision of Advanced Practitioner (CNM/NP): Evaluation and management procedures were performed by the Advanced Practitioner under my supervision and collaboration. I have reviewed the Advanced Practitioner's note and chart, and I agree with the management and plan.  LEGGETT,KELLY H. 12:47 PM   

## 2013-09-15 NOTE — MAU Note (Signed)
Pt states here for repeat BHGC post second dose of Methotrexate. Denies pain. Bleeding is scant

## 2013-09-15 NOTE — MAU Provider Note (Signed)
  History     CSN: [id number]  Arrival date and time: 09/15/13 1335   None     Chief Complaint  Patient presents with  . Labs Only   HPI Natalie Fischer 32 y.o. Client returns today for Day 4 followup after her second dose of MTX on 09-12-13.    OB History   Grav Para Term Preterm Abortions TAB SAB Ect Mult Living   2    1   1         Past Medical History  Diagnosis Date  . GERD (gastroesophageal reflux disease)     Past Surgical History  Procedure Laterality Date  . Ectopic pregnancy surgery      Family History  Problem Relation Age of Onset  . Cancer Sister   . Alcohol abuse Neg Hx   . Arthritis Neg Hx   . Asthma Neg Hx   . Birth defects Neg Hx   . COPD Neg Hx   . Depression Neg Hx   . Diabetes Neg Hx   . Drug abuse Neg Hx   . Early death Neg Hx   . Hearing loss Neg Hx   . Heart disease Neg Hx   . Hyperlipidemia Neg Hx   . Hypertension Neg Hx   . Kidney disease Neg Hx   . Learning disabilities Neg Hx   . Mental illness Neg Hx   . Mental retardation Neg Hx   . Miscarriages / Stillbirths Neg Hx   . Stroke Neg Hx   . Vision loss Neg Hx   . Varicose Veins Neg Hx     History  Substance Use Topics  . Smoking status: Current Some Day Smoker -- 0.25 packs/day  . Smokeless tobacco: Not on file  . Alcohol Use: Yes    Allergies: No Known Allergies  Prescriptions prior to admission  Medication Sig Dispense Refill  . acetaminophen (TYLENOL) 500 MG tablet Take 1,000 mg by mouth every 6 (six) hours as needed for headache (For cramping.).        Review of Systems  Constitutional: Negative for fever.  Gastrointestinal: Negative for nausea, vomiting and abdominal pain.  Genitourinary:       No vaginal bleeding   Physical Exam   Last menstrual period 07/17/2013.  Physical Exam  Nursing note and vitals reviewed. Constitutional: She is oriented to person, place, and time. She appears well-developed and well-nourished. No distress.  HENT:  Head:  Normocephalic.  Eyes: EOM are normal.  Neck: Neck supple.  Musculoskeletal: Normal range of motion.  Neurological: She is alert and oriented to person, place, and time.  Skin: Skin is warm and dry.  Psychiatric: She has a normal mood and affect.    MAU Course  Procedures  MDM Results for Natalie, Fischer (MRN [id number]) as of 09/15/2013 14:57  Ref. Range 09/02/2013 15:35 09/02/2013 16:40 09/12/2013 11:55 09/12/2013 14:57 09/15/2013 13:44  hCG, Beta Chain, Quant, S Latest Range: <5 mIU/mL   5040 (H)  3636 (H)    Assessment and Plan  Falling quants after second dose of methotrexate  Plan Continue pelvic rest Return on Wednesday for Day 7 labs   Natalie Fischer 09/15/2013, 1:49 PM

## 2013-09-18 ENCOUNTER — Inpatient Hospital Stay (HOSPITAL_COMMUNITY)
Admission: AD | Admit: 2013-09-18 | Discharge: 2013-09-18 | Disposition: A | Discharge status: Home / Self Care | Payer: Self-pay | Source: Ambulatory Visit | Attending: Obstetrics & Gynecology | Admitting: Obstetrics & Gynecology

## 2013-09-18 DIAGNOSIS — O00109 Unspecified tubal pregnancy without intrauterine pregnancy: Secondary | ICD-10-CM | POA: Insufficient documentation

## 2013-09-18 DIAGNOSIS — O009 Unspecified ectopic pregnancy without intrauterine pregnancy: Secondary | ICD-10-CM

## 2013-09-18 LAB — HCG, QUANTITATIVE, PREGNANCY: hCG, Beta Chain, Quant, S: 2516 m[IU]/mL — ABNORMAL HIGH (ref <5)

## 2013-09-18 NOTE — MAU Note (Signed)
Here for follow up.  Now having brownish d/c. Has minor cramps

## 2013-09-18 NOTE — MAU Provider Note (Signed)
History     CSN: [id number]  Arrival date and time: 09/18/13 1722   None     Chief Complaint  Patient presents with  . Follow-up   HPI  Pt is a 32 yo G2P0010 at [redacted]w[redacted]d wks IUP here for day 7 BHCG.  The patient was diagnosed with an ectopic pregnancy on 09/02/13. She was given MTX that day and did not follow-up for day 4 or 7 labs.  Returned on 09/15/13 with some some mild cramping and light bleeding today, but no pain.  Ultrasound that day again noted a mass in the right adnexa inferior to the right ovary concerning for ectopic pregnancy, slightly larger than prior study, measuring maximally 2.2 cm. No fetal pole or heartbeat seen.  Pt received another dose of MTX.  Returned on 11/23 day#4 after 2nd MTX and BHCG was 3636.  Here today with no report of bleeding or pain.    BHCGs:  Results for Natalie Fischer, Natalie Fischer (MRN [id number]) as of 09/18/2013 19:13  Ref. Range 09/02/2013 12:21 09/02/2013 16:40 09/12/2013 11:55 09/15/2013 13:44 09/18/2013 17:54  hCG, Beta Chain, Quant, S Latest Range: <5 mIU/mL 3291 (H)  5040 (H) 3636 (H) 2516 (H)   Past Medical History  Diagnosis Date  . GERD (gastroesophageal reflux disease)     Past Surgical History  Procedure Laterality Date  . Ectopic pregnancy surgery      Family History  Problem Relation Age of Onset  . Cancer Sister   . Alcohol abuse Neg Hx   . Arthritis Neg Hx   . Asthma Neg Hx   . Birth defects Neg Hx   . COPD Neg Hx   . Depression Neg Hx   . Diabetes Neg Hx   . Drug abuse Neg Hx   . Early death Neg Hx   . Hearing loss Neg Hx   . Heart disease Neg Hx   . Hyperlipidemia Neg Hx   . Hypertension Neg Hx   . Kidney disease Neg Hx   . Learning disabilities Neg Hx   . Mental illness Neg Hx   . Mental retardation Neg Hx   . Miscarriages / Stillbirths Neg Hx   . Stroke Neg Hx   . Vision loss Neg Hx   . Varicose Veins Neg Hx     History  Substance Use Topics  . Smoking status: Current Some Day Smoker -- 0.25 packs/day  .  Smokeless tobacco: Not on file  . Alcohol Use: Yes    Allergies: No Known Allergies  Prescriptions prior to admission  Medication Sig Dispense Refill  . acetaminophen (TYLENOL) 500 MG tablet Take 1,000 mg by mouth every 6 (six) hours as needed for headache (For cramping.).        ROS Pertinent info in HPI  Physical Exam   Blood pressure 134/71, pulse 85, temperature 97.4 F (36.3 C), temperature source Oral, resp. rate 18, last menstrual period 07/17/2013.  Physical Exam  Constitutional: She is oriented to person, place, and time. She appears well-developed and well-nourished. No distress.  HENT:  Head: Normocephalic.  Neck: Normal range of motion. Neck supple.  Neurological: She is alert and oriented to person, place, and time. She has normal reflexes.  Skin: Skin is warm and dry.    MAU Course  Procedures  Results for orders placed during the hospital encounter of 09/18/13 (from the past 24 hour(s))  HCG, QUANTITATIVE, PREGNANCY     Status: Abnormal   Collection Time    09/18/13  5:54  PM      Result Value Range   hCG, Beta Chain, Quant, S 2516 (*) <5 mIU/mL     Assessment and Plan  32 yo G2P0010 at [redacted]w[redacted]d wks  Ectopic Pregnancy s/p MTX  Plan: Discharge to home Ectopic precautions  Repeat BHCG in one week  [facility] 09/18/2013, 7:16 PM

## 2013-09-18 NOTE — MAU Provider Note (Signed)
Attestation of Attending Supervision of Advanced Practitioner (PA/CNM/NP): Evaluation and management procedures were performed by the Advanced Practitioner under my supervision and collaboration.  I have reviewed the Advanced Practitioner's note and chart, and I agree with the management and plan.  Timesha Cervantez, MD, FACOG Attending Obstetrician & Gynecologist Faculty Practice, [facility]   

## 2013-09-25 ENCOUNTER — Inpatient Hospital Stay (HOSPITAL_COMMUNITY)
Admission: AD | Admit: 2013-09-25 | Discharge: 2013-09-25 | Disposition: A | Discharge status: Home / Self Care | Payer: Self-pay | Source: Ambulatory Visit | Attending: Obstetrics & Gynecology | Admitting: Obstetrics & Gynecology

## 2013-09-25 DIAGNOSIS — O00109 Unspecified tubal pregnancy without intrauterine pregnancy: Secondary | ICD-10-CM | POA: Insufficient documentation

## 2013-09-25 DIAGNOSIS — Z09 Encounter for follow-up examination after completed treatment for conditions other than malignant neoplasm: Secondary | ICD-10-CM

## 2013-09-25 NOTE — MAU Provider Note (Signed)
History     CSN: [id number]  Arrival date and time: 09/25/13 1613   None     No chief complaint on file.  HPI Natalie Fischer is 32 y.o. G2P0010 [redacted]w[redacted]d weeks presenting with follow up after dx of right ectopic pregnancy and treatment with MTX.  This is her second ectopic on the right with surgical removal of ectopic.She was initially seen 09/02/12 given MTX that day and did not return for repeat BHCG on days 4 or 7th.  She returned on 11/23 with mild cramping and light bleeding without frank pain.  U/S that day showed the mass to still be there and slightly larger.  No FP or cardiac activity.   She was given another injection of MTX on that date.  BHCG that date was 3636.  She returned on 11/26 , no bleeding and BHCG had dropped to 2516.  She denies vaginal bleeding today and only mild cramping "nagging".      Past Medical History  Diagnosis Date  . GERD (gastroesophageal reflux disease)     Past Surgical History  Procedure Laterality Date  . Ectopic pregnancy surgery      Family History  Problem Relation Age of Onset  . Cancer Sister   . Alcohol abuse Neg Hx   . Arthritis Neg Hx   . Asthma Neg Hx   . Birth defects Neg Hx   . COPD Neg Hx   . Depression Neg Hx   . Diabetes Neg Hx   . Drug abuse Neg Hx   . Early death Neg Hx   . Hearing loss Neg Hx   . Heart disease Neg Hx   . Hyperlipidemia Neg Hx   . Hypertension Neg Hx   . Kidney disease Neg Hx   . Learning disabilities Neg Hx   . Mental illness Neg Hx   . Mental retardation Neg Hx   . Miscarriages / Stillbirths Neg Hx   . Stroke Neg Hx   . Vision loss Neg Hx   . Varicose Veins Neg Hx     History  Substance Use Topics  . Smoking status: Current Some Day Smoker -- 0.25 packs/day  . Smokeless tobacco: Not on file  . Alcohol Use: Yes    Allergies: No Known Allergies  Prescriptions prior to admission  Medication Sig Dispense Refill  . acetaminophen (TYLENOL) 500 MG tablet Take 1,000 mg by mouth every 6  (six) hours as needed for headache (For cramping.).        Review of Systems  Gastrointestinal: Positive for abdominal pain (mild cramping).  Genitourinary:       Neg for vaginal bleeding   Physical Exam   Last menstrual period 07/17/2013.  Physical Exam  Constitutional: She is oriented to person, place, and time. She appears well-developed and well-nourished. No distress.  Genitourinary:  Not indicated  Neurological: She is alert and oriented to person, place, and time.  Psychiatric: She has a normal mood and affect. Her behavior is normal.     Results for orders placed during the hospital encounter of 09/25/13 (from the past 24 hour(s))  HCG, QUANTITATIVE, PREGNANCY     Status: Abnormal   Collection Time    09/25/13  4:30 PM      Result Value Range   hCG, Beta Chain, Quant, S 835 (*) <5 mIU/mL   MAU Course  Procedures  MDM   Assessment and Plan  A:  Right ectopic pregnancy --treated with MTX X 2  BCHGs falling  P:  Return to MAU in 1 week for follow up BHCG       Return before that time for increased pain or vaginal bleeding      Patient plans to have continued care at [facility]   Tyson Masin,EVE M 09/25/2013, 4:27 PM

## 2013-10-02 NOTE — MAU Provider Note (Signed)
Attestation of Attending Supervision of Advanced Practitioner (CNM/NP): Evaluation and management procedures were performed by the Advanced Practitioner under my supervision and collaboration. I have reviewed the Advanced Practitioner's note and chart, and I agree with the management and plan.  LEGGETT,KELLY H. 9:38 AM

## 2013-10-11 ENCOUNTER — Inpatient Hospital Stay (HOSPITAL_COMMUNITY)
Admission: AD | Admit: 2013-10-11 | Discharge: 2013-10-11 | Disposition: A | Discharge status: Home / Self Care | Payer: Self-pay | Source: Ambulatory Visit | Attending: Obstetrics and Gynecology | Admitting: Obstetrics and Gynecology

## 2013-10-11 ENCOUNTER — Encounter (HOSPITAL_COMMUNITY): Payer: Self-pay

## 2013-10-11 DIAGNOSIS — O00109 Unspecified tubal pregnancy without intrauterine pregnancy: Secondary | ICD-10-CM | POA: Insufficient documentation

## 2013-10-11 DIAGNOSIS — O009 Unspecified ectopic pregnancy without intrauterine pregnancy: Secondary | ICD-10-CM

## 2013-10-11 LAB — HCG, QUANTITATIVE, PREGNANCY: hCG, Beta Chain, Quant, S: 316 m[IU]/mL — ABNORMAL HIGH (ref <5)

## 2013-10-11 NOTE — MAU Provider Note (Signed)
Attestation of Attending Supervision of Advanced Practitioner: Evaluation and management procedures were performed by the PA/NP/CNM/OB Fellow under my supervision/collaboration. Chart reviewed and agree with management and plan.  Colyn Miron V 10/11/2013 8:27 PM  Quant hcg declining slowly, longer interval recheck considered reasonable.

## 2013-10-11 NOTE — MAU Provider Note (Signed)
CC: Follow-up    HPI Natalie Fischer is a 32 y.o.  G2P0010 being followed with serial quants following MTX x2 for presumed ectopic. Denies bleeding or abdominal pain.  Summary 08/29/13: [place] ?ectopic adjacent to left adnexa.First MTX given 09/02/13 after abnormal rise in quants. Second MTX given 09/12/13 with quant of 5040 and US showing mass slightly larger 2.2 cm adjacent to right adnexa.  BHCGs: Results for Natalie, Fischer (MRN [id number])   Ref. Range  09/02/2013 12:21  09/02/2013 16:40  09/12/2013 11:55  09/15/2013 13:44  09/18/2013 17:54   hCG, Beta Chain, Quant, S  Latest Range: <5 mIU/mL  3291 (H)   5040 (H)  3636 (H)  2516 (H)   !11/26/12: 835  Past Medical History  Diagnosis Date  . GERD (gastroesophageal reflux disease)     OB History  Gravida Para Term Preterm AB SAB TAB Ectopic Multiple Living  2    1   1       # Outcome Date GA Lbr Len/2nd Weight Sex Delivery Anes PTL Lv  2 CUR           1 ECT 2008              Past Surgical History  Procedure Laterality Date  . Ectopic pregnancy surgery      History   Social History  . Marital Status: Single    Spouse Name: N/A    Number of Children: N/A  . Years of Education: N/A   Occupational History  . Not on file.   Social History Main Topics  . Smoking status: Current Some Day Smoker -- 0.25 packs/day  . Smokeless tobacco: Not on file  . Alcohol Use: Yes  . Drug Use: No  . Sexual Activity: Yes    Birth Control/ Protection: Condom   Other Topics Concern  . Not on file   Social History Narrative  . No narrative on file    No current facility-administered medications on file prior to encounter.   Current Outpatient Prescriptions on File Prior to Encounter  Medication Sig Dispense Refill  . acetaminophen (TYLENOL) 500 MG tablet Take 1,000 mg by mouth every 6 (six) hours as needed for headache (For cramping.).        No Known Allergies  ROS Pertinent items in HPI  PHYSICAL EXAM Filed Vitals:   10/11/13 1618  BP: 100/81  Pulse: 77  Temp: 98.5 F (36.9 C)  Resp: 16   General: Well nourished, well developed female in no acute distress Cardiovascular: Normal rate Respiratory: Normal effort Abdomen: Soft, nontender Back: No CVAT Extremities: No edema Neurologic: Alert and oriented Speculum exam: NEFG; vagina with physiologic discharge, no blood; cervix clean Bimanual exam: cervix closed, no CMT; uterus NSSP; no adnexal tenderness or masses   LAB RESULTS  Results for orders placed during the hospital encounter of 10/11/13 (from the past 24 hour(s))  HCG, QUANTITATIVE, PREGNANCY     Status: Abnormal   Collection Time    10/11/13  4:35 PM      Result Value Range   hCG, Beta Chain, Quant, S 316 (*) <5 mIU/mL    IMAGING US Ob Transvaginal  09/12/2013   CLINICAL DATA:  post methotrexate for possible right ectopic pregnancy.  EXAM: TRANSVAGINAL OB ULTRASOUND  TECHNIQUE: Transvaginal ultrasound was performed for complete evaluation of the gestation as well as the maternal uterus, adnexal regions, and pelvic cul-de-sac.  COMPARISON:  09/02/2013  FINDINGS: Intrauterine gestational sac: None  Yolk sac:  Absent  Embryo:  Absent  Cardiac Activity: Absent  Heart Rate:  bpm  MSD:   mm    w     d  CRL:     mm    w  d                  [place] EDC:  Maternal uterus/adnexae: Within the right adnexa there is a mass noted measuring 2.2 x 1.7 x 1.3 cm. This has an appearance of an ectopic pregnancy with an echogenic rim and central cystic area. This is slightly larger ba compared with nd prior study when this previously measured maximally 1.7 cm. No fetal pole is visualized. No free fluid. Left adnexa unremarkable.  IMPRESSION: No intrauterine pregnancy.  Again noted is the mass in the right adnexa inferior to the right ovary concerning for ectopic pregnancy, slightly larger than prior study, measuring maximally 2.2 cm. No fetal pole or heartbeat seen.  Critical Value/emergent results were called by  telephone at the time of interpretation on 09/12/2013 at 3:11 PM to Eve in the MAU, who verbally acknowledged these results.   Electronically Signed   By: Charlett Nose M.D.   On: 09/12/2013 15:12       ASSESSMENT  1. EP (ectopic pregnancy)   Clinically stable with quant beta hCG's falling  PLAN D/W Dr. Emelda Fear: Will follow up quant at [facility] on 10/21/13. Advised that she will be called with appointment time and date. Advised abstinence until resolved.  Discharge home with ectopic precautions        Danae Orleans, CNM 10/11/2013 5:36 PM

## 2013-10-11 NOTE — MAU Note (Signed)
Patient to MAU for repeat BHCG. Was scheduled to return on 12-10 but was unable to do so. Denies pain or bleeding.

## 2013-10-21 ENCOUNTER — Other Ambulatory Visit: Payer: Self-pay

## 2013-10-21 ENCOUNTER — Encounter: Payer: Self-pay | Admitting: Obstetrics & Gynecology

## 2013-10-21 DIAGNOSIS — O009 Unspecified ectopic pregnancy without intrauterine pregnancy: Secondary | ICD-10-CM

## 2013-10-22 ENCOUNTER — Telehealth: Payer: Self-pay | Admitting: General Practice

## 2013-10-22 LAB — HCG, QUANTITATIVE, PREGNANCY: hCG, Beta Chain, Quant, S: 141.5 m[IU]/mL

## 2013-10-22 NOTE — Telephone Encounter (Signed)
Message copied by Kathee Delton on Tue Oct 22, 2013  4:19 PM ------      Message from: Adam Phenix      Created: Tue Oct 22, 2013  2:02 PM       F/u appt next week ------

## 2013-10-22 NOTE — Telephone Encounter (Signed)
Called patient and a woman answered that she wasn't in- asked woman to tell the patient to call us back. Woman verbalized understanding and stated she would

## 2013-10-23 ENCOUNTER — Encounter: Payer: Self-pay | Admitting: *Deleted

## 2013-10-23 NOTE — Telephone Encounter (Signed)
Called home number and left message with female who said she would give her the message. Also called mobile number and left message to call us back- still needs to be notified that she needs to follow up with [place] next week.

## 2013-10-23 NOTE — Telephone Encounter (Signed)
Will send letter to Natalie Fischer as we have been unable to reach her by phone.

## 2013-10-25 ENCOUNTER — Telehealth (HOSPITAL_COMMUNITY): Payer: Self-pay | Admitting: *Deleted

## 2013-10-31 ENCOUNTER — Encounter: Payer: Self-pay | Admitting: Obstetrics & Gynecology

## 2013-10-31 ENCOUNTER — Ambulatory Visit (INDEPENDENT_AMBULATORY_CARE_PROVIDER_SITE_OTHER): Payer: Self-pay | Admitting: Obstetrics & Gynecology

## 2013-10-31 VITALS — BP 126/79 | HR 73 | Temp 98.4°F | Ht 71.0 in | Wt 124.6 lb

## 2013-10-31 DIAGNOSIS — O009 Unspecified ectopic pregnancy without intrauterine pregnancy: Secondary | ICD-10-CM

## 2013-10-31 NOTE — Patient Instructions (Signed)
Return to clinic for any scheduled appointments or for any gynecologic concerns as needed.   

## 2013-10-31 NOTE — Progress Notes (Signed)
   CLINIC ENCOUNTER NOTE  History:  33 y.o. G2P0010 here today for follow up after treatment of right sides ectopic pregnancy with MTX x 2 doses. No acute concerns.  Not sexually active.  Of note, this is patient's 2nd right tubal ectopic. First one was treated with salpingostomy, extensive PID noted during that surgery in 08/10/07.  The following portions of the patient's history were reviewed and updated as appropriate: allergies, current medications, past family history, past medical history, past social history, past surgical history and problem list. Normal pap a year ago as per patient.  Review of Systems:  Pertinent items are noted in HPI.  Objective:  BP 126/79  Pulse 73  Temp(Src) 98.4 F (36.9 C) (Oral)  Ht 5\' 11"  (1.803 m)  Wt 124 lb 9.6 oz (56.518 kg)  BMI 17.39 kg/m2  Breastfeeding? No Physical Exam deferred  Labs and Imaging   09/15/2013 13:44 09/18/2013 17:54 09/25/2013 16:30 10/11/2013 16:35 10/21/2013 14:40  hCG, Beta  3636 (H) 2516 (H) 835 (H) 316 (H) 141.5   09/12/2013 TRANSVAGINAL OB ULTRASOUND CLINICAL DATA: post methotrexate for possible right ectopic pregnancy. TECHNIQUE: Transvaginal ultrasound was performed for complete evaluation of the gestation as well as the maternal uterus, adnexal regions, and pelvic cul-de-sac. COMPARISON: 09/02/2013 FINDINGS: Intrauterine gestational sac: None Yolk sac: Absent Embryo: Absent Cardiac Activity: Absent Heart Rate: bpm MSD: mm w d CRL: mm w d US EDC: Maternal uterus/adnexae: Within the right adnexa there is a mass noted measuring 2.2 x 1.7 x 1.3 cm. This has an appearance of an ectopic pregnancy with an echogenic rim and central cystic area. This is slightly larger compared with prior study when this previously measured maximally 1.7 cm. No fetal pole is visualized. No free fluid. Left adnexa unremarkable. IMPRESSION: No intrauterine pregnancy. Again noted is the mass in the right adnexa inferior to the right ovary concerning  for ectopic pregnancy, slightly larger than prior study, measuring maximally 2.2 cm. No fetal pole or heartbeat seen. Critical Value/emergent results were called by telephone at the time of interpretation on 09/12/2013 at 3:11 PM to Eve in the MAU, who verbally acknowledged these results. Electronically Signed By: Charlett NoseKevin Dover M.D. On: 09/12/2013 15:12    Assessment & Plan:  Declining HCG values, will check one today.  Plan to check weekly until < 5. Patient wants to get pregnant again after resolution of this ectopic pregnancy. She was told to start PNV now. She was informed of increased risk of repeat ectopic given two previous ectopic pregnancies on the right side. If this happens, she will need a salpingectomy and evaluation of the left tube given history of extensive PID noted during last surgery in 2008. Routine preventative health maintenance measures emphasized.   Jaynie CollinsUGONNA  Wolfgang Finigan, MD, FACOG Attending Obstetrician & Gynecologist Faculty Practice, [facility]

## 2013-11-01 LAB — HCG, QUANTITATIVE, PREGNANCY: hCG, Beta Chain, Quant, S: 69.1 m[IU]/mL

## 2013-11-05 ENCOUNTER — Telehealth: Payer: Self-pay

## 2013-11-05 NOTE — Telephone Encounter (Signed)
Called pt and left message with family member to please call the clinics.  Family member stated that she would give the message.

## 2013-11-05 NOTE — Telephone Encounter (Signed)
Message copied by Faythe CasaBELLAMY, Arpi Diebold M on Tue Nov 05, 2013 10:21 AM ------      Message from: Jaynie CollinsANYANWU, UGONNA A      Created: Mon Nov 04, 2013  5:04 PM       Return for weekly HCG until <5.  Please call to inform patient of results and recommendations.       ------

## 2013-11-06 ENCOUNTER — Inpatient Hospital Stay (HOSPITAL_COMMUNITY)
Admission: AD | Admit: 2013-11-06 | Discharge: 2013-11-06 | Disposition: A | Discharge status: Home / Self Care | Payer: Self-pay | Source: Ambulatory Visit | Attending: Obstetrics and Gynecology | Admitting: Obstetrics and Gynecology

## 2013-11-06 ENCOUNTER — Encounter (HOSPITAL_COMMUNITY): Payer: Self-pay | Admitting: *Deleted

## 2013-11-06 DIAGNOSIS — R300 Dysuria: Secondary | ICD-10-CM | POA: Insufficient documentation

## 2013-11-06 DIAGNOSIS — F17200 Nicotine dependence, unspecified, uncomplicated: Secondary | ICD-10-CM | POA: Insufficient documentation

## 2013-11-06 DIAGNOSIS — R109 Unspecified abdominal pain: Secondary | ICD-10-CM | POA: Insufficient documentation

## 2013-11-06 DIAGNOSIS — N3090 Cystitis, unspecified without hematuria: Secondary | ICD-10-CM

## 2013-11-06 LAB — URINALYSIS, ROUTINE W REFLEX MICROSCOPIC
Bilirubin Urine: NEGATIVE
Glucose, UA: NEGATIVE mg/dL
Hgb urine dipstick: NEGATIVE
Ketones, ur: NEGATIVE mg/dL
NITRITE: NEGATIVE
PH: 5.5 (ref 5.0–8.0)
Protein, ur: NEGATIVE mg/dL
SPECIFIC GRAVITY, URINE: 1.015 (ref 1.005–1.030)
Urobilinogen, UA: 1 mg/dL (ref 0.0–1.0)

## 2013-11-06 LAB — URINE MICROSCOPIC-ADD ON

## 2013-11-06 LAB — WET PREP, GENITAL
Clue Cells Wet Prep HPF POC: NONE SEEN
TRICH WET PREP: NONE SEEN
YEAST WET PREP: NONE SEEN

## 2013-11-06 LAB — HCG, QUANTITATIVE, PREGNANCY: hCG, Beta Chain, Quant, S: 34 m[IU]/mL — ABNORMAL HIGH (ref <5)

## 2013-11-06 MED ORDER — PHENAZOPYRIDINE HCL 100 MG PO TABS: 100.0000 mg | ORAL_TABLET | Freq: Three times a day (TID) | ORAL | Status: DC | PRN

## 2013-11-06 NOTE — Telephone Encounter (Signed)
Called Lizette home number and spoke with female who said she is her Mom and she will have Aizah call the clinic. Also called mobile and left message we are calling with some important information- please call clinic and ask for nurse.

## 2013-11-06 NOTE — Discharge Instructions (Signed)

## 2013-11-06 NOTE — MAU Note (Signed)
Pt presents with complaints of lower abdominal pain since intercourse on Monday, and she has pain with urination

## 2013-11-06 NOTE — MAU Provider Note (Signed)
History     CSN: [id number]  Arrival date and time: 11/06/13 1351   First Provider Initiated Contact with Patient 11/06/13 1620      Chief Complaint  Patient presents with  . Abdominal Pain   HPI  Natalie Fischer is a 33 yo G2P0010 who presents to the MAU for painful urination that onset on Monday 1/12.  Pt reports that the pain onset after unprotected sexual intercourse.  It is associated with intermediate abdominal cramping that she describes as a "pulling" feeling and rates it a 10/10.  Reports she tried taking tylenol and BC powder and was ineffective at controlling her pain.    Pt has been followed in Gyn clinic for ectopic pregnancy with MTX admin x2 doses.  Quant hcg's before today:  09/15/2013   3636 (H)  09/18/2013   2516 (H)  09/25/2013   835 (H)  10/11/2013   316 (H)  10/21/2013   141.5  10/31/2013 69.1   OB History   Grav Para Term Preterm Abortions TAB SAB Ect Mult Living   2    1   1         Past Medical History  Diagnosis Date  . GERD (gastroesophageal reflux disease)     Past Surgical History  Procedure Laterality Date  . Ectopic pregnancy surgery      Family History  Problem Relation Age of Onset  . Cancer Sister   . Alcohol abuse Neg Hx   . Arthritis Neg Hx   . Asthma Neg Hx   . Birth defects Neg Hx   . COPD Neg Hx   . Depression Neg Hx   . Diabetes Neg Hx   . Drug abuse Neg Hx   . Early death Neg Hx   . Hearing loss Neg Hx   . Heart disease Neg Hx   . Hyperlipidemia Neg Hx   . Hypertension Neg Hx   . Kidney disease Neg Hx   . Learning disabilities Neg Hx   . Mental illness Neg Hx   . Mental retardation Neg Hx   . Miscarriages / Stillbirths Neg Hx   . Stroke Neg Hx   . Vision loss Neg Hx   . Varicose Veins Neg Hx     History  Substance Use Topics  . Smoking status: Current Some Day Smoker -- 0.25 packs/day  . Smokeless tobacco: Not on file  . Alcohol Use: Yes    Allergies: No Known Allergies  Prescriptions prior to  admission  Medication Sig Dispense Refill  . metroNIDAZOLE (FLAGYL) 500 MG tablet Take 500 mg by mouth 2 (two) times daily.        Review of Systems  Constitutional: Negative for fever and chills.  Gastrointestinal: Positive for abdominal pain. Negative for nausea, vomiting and diarrhea.  Genitourinary: Positive for dysuria and frequency. Negative for urgency and hematuria.   Physical Exam   Blood pressure 113/82, pulse 95, temperature 98.1 F (36.7 C), resp. rate 18, height 5\' 11"  (1.803 m), weight 56.246 kg (124 lb).  Physical Exam  Constitutional: She appears well-developed and well-nourished.  HENT:  Head: Normocephalic.  GI: Soft. She exhibits no distension. There is tenderness in the right lower quadrant and left lower quadrant.  Genitourinary:  Speculum exam:  Vagina: white, creamy discharge (scant) noted.  No blood pooling, no erythema, no lesions noted. Cervix: no discharge noted.  No erythema, no lesion found externally.  Bi-manual exam:  Cervix:  No CMT, cervical os closed Uterus:  Non tender  Adnexa: Non tender bilaterally GC/Chlam and wet prep collected Chaperone present for exam  Neurological: She is alert.   Results for orders placed during the hospital encounter of 11/10/13 (from the past 168 hour(s))  POCT PREGNANCY, URINE   Collection Time    11/10/13  3:09 PM      Result Value Range   Preg Test, Ur POSITIVE (*) NEGATIVE  URINALYSIS, ROUTINE W REFLEX MICROSCOPIC   Collection Time    11/10/13  3:10 PM      Result Value Range   Color, Urine YELLOW  YELLOW   APPearance CLEAR  CLEAR   Specific Gravity, Urine 1.015  1.005 - 1.030   pH 8.5 (*) 5.0 - 8.0   Glucose, UA NEGATIVE  NEGATIVE mg/dL   Hgb urine dipstick TRACE (*) NEGATIVE   Bilirubin Urine NEGATIVE  NEGATIVE   Ketones, ur NEGATIVE  NEGATIVE mg/dL   Protein, ur 30 (*) NEGATIVE mg/dL   Urobilinogen, UA 2.0 (*) 0.0 - 1.0 mg/dL   Nitrite NEGATIVE  NEGATIVE   Leukocytes, UA TRACE (*) NEGATIVE   URINE MICROSCOPIC-ADD ON   Collection Time    11/10/13  3:10 PM      Result Value Range   Squamous Epithelial / LPF RARE  RARE   WBC, UA 21-50  <3 WBC/hpf   RBC / HPF 3-6  <3 RBC/hpf   Bacteria, UA RARE  RARE   Urine-Other MUCOUS PRESENT    CBC   Collection Time    11/10/13  4:48 PM      Result Value Range   WBC 8.1  4.0 - 10.5 K/uL   RBC 3.99  3.87 - 5.11 MIL/uL   Hemoglobin 11.9 (*) 12.0 - 15.0 g/dL   HCT 04.5 (*) 40.9 - 81.1 %   MCV 88.5  78.0 - 100.0 fL   MCH 29.8  26.0 - 34.0 pg   MCHC 33.7  30.0 - 36.0 g/dL   RDW 91.4  78.2 - 95.6 %   Platelets 180  150 - 400 K/uL  HCG, QUANTITATIVE, PREGNANCY   Collection Time    11/06/13       Result Value Range   hCG, Beta Chain, Quant, S 34 (*) <5 mIU/mL  Results for orders placed during the hospital encounter of 11/06/13 (from the past 168 hour(s))  WET PREP, GENITAL   Collection Time    11/06/13  4:40 PM      Result Value Range   Yeast Wet Prep HPF POC NONE SEEN  NONE SEEN   Trich, Wet Prep NONE SEEN  NONE SEEN   Clue Cells Wet Prep HPF POC NONE SEEN  NONE SEEN   WBC, Wet Prep HPF POC FEW (*) NONE SEEN  GC/CHLAMYDIA PROBE AMP   Collection Time    11/06/13  4:40 PM      Result Value Range   CT Probe RNA NEGATIVE  NEGATIVE   GC Probe RNA NEGATIVE  NEGATIVE    MAU Course  Procedures  MDM  Pelvic exam Wet prep and GC/Chlam Urinalysis     Assessment and Plan   Assessment:  #Dysuria-The differential for sexually active women is broad, but includes cystitis, vaginitis, PID, and urethritis.  Cultures for GC/Chlam are pending; however wet prep was negative eliminating trichomatous, bacterial vaginosis, and yeast infection making vaginitis unlikely.  Pelvic exam was unremarkable for cervical motion and adnexal tenderness lowering the suspicion for PID.  Gonococcal and chlamydial urethritis is still possible pending cultures.  Urinalysis was negative  for UTI; however non infectious causes of cystitis such as acidic drinks  is possible.   Plan:  1. Discharge in stable condition 2. Follow up with Gynecology for resolution of current ectopic pregnancy.  HCG quantitative was taken today, [facility] 7 [facility] with [facility]. 3. Return to the MAU if symptoms persist or worsen.   Toilolo, Tifi 11/06/2013, 4:22 PM   I have seen this patient and agree with the above PA student's note. Recommend pt use OTC AZO and drink plenty of water to resolve intermittent dysuria.  Return to MAU if symptoms persist or worsen.   LEFTWICH-KIRBY, Amore Grater Certified Nurse-Midwife

## 2013-11-07 LAB — GC/CHLAMYDIA PROBE AMP
CT PROBE, AMP APTIMA: NEGATIVE
GC Probe RNA: NEGATIVE

## 2013-11-07 NOTE — Telephone Encounter (Signed)
Called Natalie Fischer and explained to her Dr. Macon LargeAnyanwu reccomendations for weekly bhcg until less than 5.  Natalie Fischer states she came to MAU yesterday for other problems and they checked it , made appt for 1/19.  Per lab review was 32, informed her doesn't need recheck until 11/13/13- she agreed to change appt to 11/13/13 at 10am for lab/ bhcg.   Natalie Fischer voices understanding.

## 2013-11-10 ENCOUNTER — Inpatient Hospital Stay (HOSPITAL_COMMUNITY): Payer: Self-pay

## 2013-11-10 ENCOUNTER — Encounter (HOSPITAL_COMMUNITY): Payer: Self-pay | Admitting: *Deleted

## 2013-11-10 ENCOUNTER — Inpatient Hospital Stay (HOSPITAL_COMMUNITY)
Admission: AD | Admit: 2013-11-10 | Discharge: 2013-11-10 | Disposition: A | Discharge status: Home / Self Care | Payer: Self-pay | Source: Ambulatory Visit | Attending: Obstetrics & Gynecology | Admitting: Obstetrics & Gynecology

## 2013-11-10 DIAGNOSIS — Z3201 Encounter for pregnancy test, result positive: Secondary | ICD-10-CM | POA: Insufficient documentation

## 2013-11-10 DIAGNOSIS — Z8759 Personal history of other complications of pregnancy, childbirth and the puerperium: Secondary | ICD-10-CM

## 2013-11-10 DIAGNOSIS — N390 Urinary tract infection, site not specified: Secondary | ICD-10-CM | POA: Insufficient documentation

## 2013-11-10 DIAGNOSIS — R350 Frequency of micturition: Secondary | ICD-10-CM | POA: Insufficient documentation

## 2013-11-10 DIAGNOSIS — N949 Unspecified condition associated with female genital organs and menstrual cycle: Secondary | ICD-10-CM | POA: Insufficient documentation

## 2013-11-10 HISTORY — DX: Unspecified ectopic pregnancy without intrauterine pregnancy: O00.90

## 2013-11-10 LAB — CBC
HCT: 35.3 % — ABNORMAL LOW (ref 36.0–46.0)
Hemoglobin: 11.9 g/dL — ABNORMAL LOW (ref 12.0–15.0)
MCH: 29.8 pg (ref 26.0–34.0)
MCHC: 33.7 g/dL (ref 30.0–36.0)
MCV: 88.5 fL (ref 78.0–100.0)
PLATELETS: 180 10*3/uL (ref 150–400)
RBC: 3.99 MIL/uL (ref 3.87–5.11)
RDW: 13.8 % (ref 11.5–15.5)
WBC: 8.1 10*3/uL (ref 4.0–10.5)

## 2013-11-10 LAB — URINALYSIS, ROUTINE W REFLEX MICROSCOPIC
Bilirubin Urine: NEGATIVE
GLUCOSE, UA: NEGATIVE mg/dL
Ketones, ur: NEGATIVE mg/dL
Nitrite: NEGATIVE
PROTEIN: 30 mg/dL — AB
SPECIFIC GRAVITY, URINE: 1.015 (ref 1.005–1.030)
UROBILINOGEN UA: 2 mg/dL — AB (ref 0.0–1.0)
pH: 8.5 — ABNORMAL HIGH (ref 5.0–8.0)

## 2013-11-10 LAB — POCT PREGNANCY, URINE: PREG TEST UR: POSITIVE — AB

## 2013-11-10 LAB — URINE MICROSCOPIC-ADD ON

## 2013-11-10 LAB — HCG, QUANTITATIVE, PREGNANCY: HCG, BETA CHAIN, QUANT, S: 22 m[IU]/mL — AB (ref <5)

## 2013-11-10 MED ORDER — CIPROFLOXACIN HCL 250 MG PO TABS: 250.0000 mg | ORAL_TABLET | Freq: Two times a day (BID) | ORAL | Status: DC

## 2013-11-10 MED ORDER — KETOROLAC TROMETHAMINE 60 MG/2ML IM SOLN: 60.0000 mg | Freq: Once | INTRAMUSCULAR | Status: DC

## 2013-11-10 MED ORDER — OXYCODONE-ACETAMINOPHEN 5-325 MG PO TABS: 1.0000 | ORAL_TABLET | ORAL | Status: DC | PRN

## 2013-11-10 MED ORDER — CEPHALEXIN 500 MG PO CAPS: 500.0000 mg | ORAL_CAPSULE | Freq: Four times a day (QID) | ORAL | Status: DC

## 2013-11-10 MED ORDER — HYDROMORPHONE HCL PF 1 MG/ML IJ SOLN
1.0000 mg | Freq: Once | INTRAMUSCULAR | Status: AC
  Administered 2013-11-10: 1 mg via INTRAMUSCULAR
  Filled 2013-11-10: qty 1

## 2013-11-10 MED ORDER — KETOROLAC TROMETHAMINE 10 MG PO TABS
10.0000 mg | ORAL_TABLET | Freq: Four times a day (QID) | ORAL | Status: DC | PRN
Start: 2013-11-10 — End: 2013-11-10

## 2013-11-10 NOTE — MAU Provider Note (Signed)
History     CSN: [id number]  Arrival date and time: 11/10/13 1424   First Provider Initiated Contact with Patient 11/10/13 1623      Chief Complaint  Patient presents with  . Vaginal Pain   HPI [name] is a 33 y.o. [id number] who presents to MAU today with "vaginal pain." the patient states that she feels a "pulling" in her suprapubic area with urination. The patient is also having urinary frequency and urgency. She denies sores of the vaginal area, vaginal discharge, bleeding or fever. The patient had an ectopic that was treated with MTX in November, 2014. She has been followed in [facility] clinic for serial hCGs.   OB History   Grav Para Term Preterm Abortions TAB SAB Ect Mult Living   2    2   2         Past Medical History  Diagnosis Date  . GERD (gastroesophageal reflux disease)   . Ectopic pregnancy 2008 and 2014    Past Surgical History  Procedure Laterality Date  . Ectopic pregnancy surgery      Family History  Problem Relation Age of Onset  . Cancer Sister   . Alcohol abuse Neg Hx   . Arthritis Neg Hx   . Asthma Neg Hx   . Birth defects Neg Hx   . COPD Neg Hx   . Depression Neg Hx   . Diabetes Neg Hx   . Drug abuse Neg Hx   . Early death Neg Hx   . Hearing loss Neg Hx   . Heart disease Neg Hx   . Hyperlipidemia Neg Hx   . Hypertension Neg Hx   . Kidney disease Neg Hx   . Learning disabilities Neg Hx   . Mental illness Neg Hx   . Mental retardation Neg Hx   . Miscarriages / Stillbirths Neg Hx   . Stroke Neg Hx   . Vision loss Neg Hx   . Varicose Veins Neg Hx     History  Substance Use Topics  . Smoking status: Current Some Day Smoker -- 0.25 packs/day  . Smokeless tobacco: Never Used  . Alcohol Use: Yes    Allergies: No Known Allergies  Prescriptions prior to admission  Medication Sig Dispense Refill  . phenazopyridine (PYRIDIUM) 100 MG tablet Take 1 tablet (100 mg total) by mouth 3 (three) times daily as needed for pain.  10 tablet  2   . Prenatal Vit-Fe Fumarate-FA (PRENATAL MULTIVITAMIN) TABS tablet Take 1 tablet by mouth daily at 12 noon.      Marland Kitchen PRESCRIPTION MEDICATION Take 1 tablet by mouth as needed (severe pain).        Review of Systems  Constitutional: Negative for fever and malaise/fatigue.  Gastrointestinal: Positive for abdominal pain. Negative for nausea, vomiting, diarrhea and constipation.  Genitourinary: Positive for dysuria, urgency, frequency and hematuria. Negative for flank pain.       Neg - vaginal bleeding, discharge   Physical Exam   Blood pressure 104/69, pulse 102, temperature 98.2 F (36.8 C), resp. rate 18, height 5\' 11"  (1.803 m), weight 124 lb (56.246 kg), last menstrual period 10/18/2013.  Physical Exam  Constitutional: She is oriented to person, place, and time. She appears well-developed and well-nourished. No distress.  HENT:  Head: Normocephalic and atraumatic.  Cardiovascular: Normal rate.   Respiratory: Effort normal.  GI: There is no CVA tenderness.  Genitourinary: There is no rash, tenderness, lesion or injury on the right labia. There is  no rash, tenderness, lesion or injury on the left labia.  Neurological: She is alert and oriented to person, place, and time.  Skin: Skin is warm and dry. No erythema.  Psychiatric: She has a normal mood and affect.   Results for orders placed during the hospital encounter of 11/10/13 (from the past 24 hour(s))  POCT PREGNANCY, URINE     Status: Abnormal   Collection Time    11/10/13  3:09 PM      Result Value Range   Preg Test, Ur POSITIVE (*) NEGATIVE  URINALYSIS, ROUTINE W REFLEX MICROSCOPIC     Status: Abnormal   Collection Time    11/10/13  3:10 PM      Result Value Range   Color, Urine YELLOW  YELLOW   APPearance CLEAR  CLEAR   Specific Gravity, Urine 1.015  1.005 - 1.030   pH 8.5 (*) 5.0 - 8.0   Glucose, UA NEGATIVE  NEGATIVE mg/dL   Hgb urine dipstick TRACE (*) NEGATIVE   Bilirubin Urine NEGATIVE  NEGATIVE   Ketones, ur  NEGATIVE  NEGATIVE mg/dL   Protein, ur 30 (*) NEGATIVE mg/dL   Urobilinogen, UA 2.0 (*) 0.0 - 1.0 mg/dL   Nitrite NEGATIVE  NEGATIVE   Leukocytes, UA TRACE (*) NEGATIVE  URINE MICROSCOPIC-ADD ON     Status: None   Collection Time    11/10/13  3:10 PM      Result Value Range   Squamous Epithelial / LPF RARE  RARE   WBC, UA 21-50  <3 WBC/hpf   RBC / HPF 3-6  <3 RBC/hpf   Bacteria, UA RARE  RARE   Urine-Other MUCOUS PRESENT    CBC     Status: Abnormal   Collection Time    11/10/13  4:48 PM      Result Value Range   WBC 8.1  4.0 - 10.5 K/uL   RBC 3.99  3.87 - 5.11 MIL/uL   Hemoglobin 11.9 (*) 12.0 - 15.0 g/dL   HCT 81.135.3 (*) 91.436.0 - 78.246.0 %   MCV 88.5  78.0 - 100.0 fL   MCH 29.8  26.0 - 34.0 pg   MCHC 33.7  30.0 - 36.0 g/dL   RDW 95.613.8  21.311.5 - 08.615.5 %   Platelets 180  150 - 400 K/uL  HCG, QUANTITATIVE, PREGNANCY     Status: Abnormal   Collection Time    11/10/13  4:48 PM      Result Value Range   hCG, Beta Chain, Quant, S 22 (*) <5 mIU/mL    MAU Course  Procedures None  MDM +UPT UA, CBC and quant hCG today Quant hCG continues to decline. [facility] 5  Assessment and Plan  A: UTI H/O ectopic pregnancy  P: Discharge home Rx for Keflex and Percocet given/sent to patient's pharmacy Patient advise to increase PO hydration as tolerated Warning sign for pyelonephritis discussed Patient advised [facility] to change follow-up lab appointment from Thursday to next Monday. Patient voiced understanding Patient may return to MAU as needed or if her condition were to change or worsen  Freddi StarrJulie N Ethier, PA-C  11/10/2013, 5:42 PM

## 2013-11-10 NOTE — MAU Note (Cosign Needed)
Pt presents with complaints of pain in her vaginal area that she describes as severe pressure. Pt states that she was evaluated last week for the same problem and it has not got any better

## 2013-11-10 NOTE — Discharge Instructions (Signed)
Urinary Tract Infection  Urinary tract infections (UTIs) can develop anywhere along your urinary tract. Your urinary tract is your body's drainage system for removing wastes and extra water. Your urinary tract includes two kidneys, two ureters, a bladder, and a urethra. Your kidneys are a pair of bean-shaped organs. Each kidney is about the size of your fist. They are located below your ribs, one on each side of your spine.  CAUSES  Infections are caused by microbes, which are microscopic organisms, including fungi, viruses, and bacteria. These organisms are so small that they can only be seen through a microscope. Bacteria are the microbes that most commonly cause UTIs.  SYMPTOMS   Symptoms of UTIs may vary by age and gender of the patient and by the location of the infection. Symptoms in young women typically include a frequent and intense urge to urinate and a painful, burning feeling in the bladder or urethra during urination. Older women and men are more likely to be tired, shaky, and weak and have muscle aches and abdominal pain. A fever may mean the infection is in your kidneys. Other symptoms of a kidney infection include pain in your back or sides below the ribs, nausea, and vomiting.  DIAGNOSIS  To diagnose a UTI, your caregiver will ask you about your symptoms. Your caregiver also will ask to provide a urine sample. The urine sample will be tested for bacteria and white blood cells. White blood cells are made by your body to help fight infection.  TREATMENT   Typically, UTIs can be treated with medication. Because most UTIs are caused by a bacterial infection, they usually can be treated with the use of antibiotics. The choice of antibiotic and length of treatment depend on your symptoms and the type of bacteria causing your infection.  HOME CARE INSTRUCTIONS   If you were prescribed antibiotics, take them exactly as your caregiver instructs you. Finish the medication even if you feel better after you  have only taken some of the medication.   Drink enough water and fluids to keep your urine clear or pale yellow.   Avoid caffeine, tea, and carbonated beverages. They tend to irritate your bladder.   Empty your bladder often. Avoid holding urine for long periods of time.   Empty your bladder before and after sexual intercourse.   After a bowel movement, women should cleanse from front to back. Use each tissue only once.  SEEK MEDICAL CARE IF:    You have back pain.   You develop a fever.   Your symptoms do not begin to resolve within 3 days.  SEEK IMMEDIATE MEDICAL CARE IF:    You have severe back pain or lower abdominal pain.   You develop chills.   You have nausea or vomiting.   You have continued burning or discomfort with urination.  MAKE SURE YOU:    Understand these instructions.   Will watch your condition.   Will get help right away if you are not doing well or get worse.  Document Released: 07/20/2005 Document Revised: 04/10/2012 Document Reviewed: 11/18/2011  [employer] Patient Information 2014 [employer].

## 2013-11-11 ENCOUNTER — Other Ambulatory Visit: Payer: Self-pay

## 2013-11-13 ENCOUNTER — Other Ambulatory Visit: Payer: Self-pay

## 2013-11-14 NOTE — MAU Provider Note (Signed)
Attestation of Attending Supervision of Advanced Practitioner (CNM/NP): Evaluation and management procedures were performed by the Advanced Practitioner under my supervision and collaboration.  I have reviewed the Advanced Practitioner's note and chart, and I agree with the management and plan.  Advik Weatherspoon 11/14/2013 8:28 AM   

## 2013-12-06 ENCOUNTER — Encounter: Payer: Self-pay | Admitting: *Deleted

## 2014-01-09 ENCOUNTER — Encounter (HOSPITAL_COMMUNITY): Payer: Self-pay | Admitting: *Deleted

## 2014-01-09 ENCOUNTER — Inpatient Hospital Stay (HOSPITAL_COMMUNITY): Payer: Self-pay

## 2014-01-09 ENCOUNTER — Inpatient Hospital Stay (HOSPITAL_COMMUNITY)
Admission: AD | Admit: 2014-01-09 | Discharge: 2014-01-09 | Disposition: A | Discharge status: Home / Self Care | Payer: Self-pay | Source: Ambulatory Visit | Attending: Obstetrics & Gynecology | Admitting: Obstetrics & Gynecology

## 2014-01-09 DIAGNOSIS — Z8759 Personal history of other complications of pregnancy, childbirth and the puerperium: Secondary | ICD-10-CM

## 2014-01-09 DIAGNOSIS — R1031 Right lower quadrant pain: Secondary | ICD-10-CM | POA: Insufficient documentation

## 2014-01-09 DIAGNOSIS — F17200 Nicotine dependence, unspecified, uncomplicated: Secondary | ICD-10-CM | POA: Insufficient documentation

## 2014-01-09 DIAGNOSIS — B9689 Other specified bacterial agents as the cause of diseases classified elsewhere: Secondary | ICD-10-CM | POA: Insufficient documentation

## 2014-01-09 DIAGNOSIS — N760 Acute vaginitis: Secondary | ICD-10-CM | POA: Insufficient documentation

## 2014-01-09 DIAGNOSIS — K219 Gastro-esophageal reflux disease without esophagitis: Secondary | ICD-10-CM | POA: Insufficient documentation

## 2014-01-09 DIAGNOSIS — A499 Bacterial infection, unspecified: Secondary | ICD-10-CM | POA: Insufficient documentation

## 2014-01-09 HISTORY — DX: Bipolar disorder, unspecified: F31.9

## 2014-01-09 LAB — CBC WITH DIFFERENTIAL/PLATELET
BASOS ABS: 0 10*3/uL (ref 0.0–0.1)
BASOS PCT: 0 % (ref 0–1)
EOS PCT: 2 % (ref 0–5)
Eosinophils Absolute: 0.1 10*3/uL (ref 0.0–0.7)
HCT: 38.8 % (ref 36.0–46.0)
Hemoglobin: 13 g/dL (ref 12.0–15.0)
Lymphocytes Relative: 29 % (ref 12–46)
Lymphs Abs: 1.5 10*3/uL (ref 0.7–4.0)
MCH: 30.3 pg (ref 26.0–34.0)
MCHC: 33.5 g/dL (ref 30.0–36.0)
MCV: 90.4 fL (ref 78.0–100.0)
Monocytes Absolute: 0.7 10*3/uL (ref 0.1–1.0)
Monocytes Relative: 14 % — ABNORMAL HIGH (ref 3–12)
Neutro Abs: 3 10*3/uL (ref 1.7–7.7)
Neutrophils Relative %: 56 % (ref 43–77)
Platelets: 164 10*3/uL (ref 150–400)
RBC: 4.29 MIL/uL (ref 3.87–5.11)
RDW: 12.7 % (ref 11.5–15.5)
WBC: 5.4 10*3/uL (ref 4.0–10.5)

## 2014-01-09 LAB — WET PREP, GENITAL
Trich, Wet Prep: NONE SEEN
Yeast Wet Prep HPF POC: NONE SEEN

## 2014-01-09 LAB — URINALYSIS, ROUTINE W REFLEX MICROSCOPIC
BILIRUBIN URINE: NEGATIVE
Glucose, UA: NEGATIVE mg/dL
Hgb urine dipstick: NEGATIVE
Ketones, ur: NEGATIVE mg/dL
LEUKOCYTES UA: NEGATIVE
NITRITE: NEGATIVE
PROTEIN: NEGATIVE mg/dL
Specific Gravity, Urine: 1.02 (ref 1.005–1.030)
Urobilinogen, UA: 4 mg/dL — ABNORMAL HIGH (ref 0.0–1.0)
pH: 7.5 (ref 5.0–8.0)

## 2014-01-09 LAB — HCG, QUANTITATIVE, PREGNANCY: hCG, Beta Chain, Quant, S: 9 m[IU]/mL — ABNORMAL HIGH (ref <5)

## 2014-01-09 LAB — POCT PREGNANCY, URINE: Preg Test, Ur: NEGATIVE

## 2014-01-09 MED ORDER — METRONIDAZOLE 500 MG PO TABS: 500.0000 mg | ORAL_TABLET | Freq: Two times a day (BID) | ORAL | Status: AC

## 2014-01-09 NOTE — Discharge Instructions (Signed)

## 2014-01-09 NOTE — MAU Note (Signed)
C/o vaginal discharge  and irritation for past 2 weeks; also c/o R lower abdominal pain that started today; has been treating discharge with Monistat;

## 2014-01-09 NOTE — MAU Provider Note (Signed)
History     CSN: [id number]  Arrival date and time: 01/09/14 1615   First Provider Initiated Contact with Patient 01/09/14 1634      Chief Complaint  Patient presents with  . Vaginal Discharge   Vaginal Discharge The patient's primary symptoms include a vaginal discharge. Associated symptoms include abdominal pain. Pertinent negatives include no chills, constipation, diarrhea, dysuria, fever, nausea or vomiting.   This is a 33 y.o. female who presents with c/o vaginal discharge and irritation for 2 weeks. Also has some RLQ pain, which she describes as "mostly pressure, not really pain". Had recent ectopic treated with MTX.  Never came for last quant. Last one was 22.  RN Note: C/o vaginal discharge and irritation for past 2 weeks; also c/o R lower abdominal pain that started today; has been treating discharge with Monistat;       OB History   Grav Para Term Preterm Abortions TAB SAB Ect Mult Living   2    2   2         Past Medical History  Diagnosis Date  . GERD (gastroesophageal reflux disease)   . Ectopic pregnancy 2008 and 2014  . Bipolar 1 disorder     Past Surgical History  Procedure Laterality Date  . Ectopic pregnancy surgery      Family History  Problem Relation Age of Onset  . Cancer Sister   . Alcohol abuse Neg Hx   . Arthritis Neg Hx   . Asthma Neg Hx   . Birth defects Neg Hx   . COPD Neg Hx   . Depression Neg Hx   . Diabetes Neg Hx   . Drug abuse Neg Hx   . Early death Neg Hx   . Hearing loss Neg Hx   . Heart disease Neg Hx   . Hyperlipidemia Neg Hx   . Hypertension Neg Hx   . Kidney disease Neg Hx   . Learning disabilities Neg Hx   . Mental illness Neg Hx   . Mental retardation Neg Hx   . Miscarriages / Stillbirths Neg Hx   . Stroke Neg Hx   . Vision loss Neg Hx   . Varicose Veins Neg Hx     History  Substance Use Topics  . Smoking status: Current Some Day Smoker -- 0.25 packs/day  . Smokeless tobacco: Never Used  . Alcohol Use:  Yes    Allergies: No Known Allergies  Prescriptions prior to admission  Medication Sig Dispense Refill  . cephALEXin (KEFLEX) 500 MG capsule Take 1 capsule (500 mg total) by mouth 4 (four) times daily.  20 capsule  0  . oxyCODONE-acetaminophen (PERCOCET/ROXICET) 5-325 MG per tablet Take 1-2 tablets by mouth every 4 (four) hours as needed for severe pain.  10 tablet  0  . phenazopyridine (PYRIDIUM) 100 MG tablet Take 1 tablet (100 mg total) by mouth 3 (three) times daily as needed for pain.  10 tablet  2  . Prenatal Vit-Fe Fumarate-FA (PRENATAL MULTIVITAMIN) TABS tablet Take 1 tablet by mouth daily at 12 noon.      Marland Kitchen. PRESCRIPTION MEDICATION Take 1 tablet by mouth as needed (severe pain).        Review of Systems  Constitutional: Negative for fever, chills and malaise/fatigue.  Gastrointestinal: Positive for heartburn ("regurgitate acid") and abdominal pain. Negative for nausea, vomiting, diarrhea and constipation.  Genitourinary: Positive for vaginal discharge. Negative for dysuria.       Vaginal discharge and irritation   Physical Exam  Blood pressure 126/73, pulse 79, temperature 97.8 F (36.6 C), temperature source Oral, resp. rate 18, height 5\' 11"  (1.803 m), weight 50.803 kg (112 lb), last menstrual period 12/12/2013.  Physical Exam  Constitutional: She is oriented to person, place, and time. She appears well-developed and well-nourished. No distress.  HENT:  Head: Normocephalic.  Cardiovascular: Normal rate.   Respiratory: Effort normal.  GI: Soft.  Genitourinary: Uterus normal. Vaginal discharge (scant white discharge, minimal erethema of vagina) found.  Musculoskeletal: Normal range of motion.  Neurological: She is alert and oriented to person, place, and time.  Skin: Skin is warm and dry.  Psychiatric: She has a normal mood and affect.    MAU Course  Procedures  MDM Discussed various tests for evaluation. Agrees to cultures, wet prep, CBC, and followup quant with  [place] to eval. Ovaries Results for orders placed during the hospital encounter of 01/09/14 (from the past 24 hour(s))  URINALYSIS, ROUTINE W REFLEX MICROSCOPIC     Status: Abnormal   Collection Time    01/09/14  4:20 PM      Result Value Ref Range   Color, Urine YELLOW  YELLOW   APPearance CLEAR  CLEAR   Specific Gravity, Urine 1.020  1.005 - 1.030   pH 7.5  5.0 - 8.0   Glucose, UA NEGATIVE  NEGATIVE mg/dL   Hgb urine dipstick NEGATIVE  NEGATIVE   Bilirubin Urine NEGATIVE  NEGATIVE   Ketones, ur NEGATIVE  NEGATIVE mg/dL   Protein, ur NEGATIVE  NEGATIVE mg/dL   Urobilinogen, UA 4.0 (*) 0.0 - 1.0 mg/dL   Nitrite NEGATIVE  NEGATIVE   Leukocytes, UA NEGATIVE  NEGATIVE  POCT PREGNANCY, URINE     Status: None   Collection Time    01/09/14  4:26 PM      Result Value Ref Range   Preg Test, Ur NEGATIVE  NEGATIVE  WET PREP, GENITAL     Status: Abnormal   Collection Time    01/09/14  4:40 PM      Result Value Ref Range   Yeast Wet Prep HPF POC NONE SEEN  NONE SEEN   Trich, Wet Prep NONE SEEN  NONE SEEN   Clue Cells Wet Prep HPF POC MANY (*) NONE SEEN   WBC, Wet Prep HPF POC FEW (*) NONE SEEN  CBC WITH DIFFERENTIAL     Status: Abnormal   Collection Time    01/09/14  4:51 PM      Result Value Ref Range   WBC 5.4  4.0 - 10.5 K/uL   RBC 4.29  3.87 - 5.11 MIL/uL   Hemoglobin 13.0  12.0 - 15.0 g/dL   HCT 16.1  09.6 - 04.5 %   MCV 90.4  78.0 - 100.0 fL   MCH 30.3  26.0 - 34.0 pg   MCHC 33.5  30.0 - 36.0 g/dL   RDW 40.9  81.1 - 91.4 %   Platelets 164  150 - 400 K/uL   Neutrophils Relative % 56  43 - 77 %   Neutro Abs 3.0  1.7 - 7.7 K/uL   Lymphocytes Relative 29  12 - 46 %   Lymphs Abs 1.5  0.7 - 4.0 K/uL   Monocytes Relative 14 (*) 3 - 12 %   Monocytes Absolute 0.7  0.1 - 1.0 K/uL   Eosinophils Relative 2  0 - 5 %   Eosinophils Absolute 0.1  0.0 - 0.7 K/uL   Basophils Relative 0  0 - 1 %  Basophils Absolute 0.0  0.0 - 0.1 K/uL  HCG, QUANTITATIVE, PREGNANCY     Status: Abnormal    Collection Time    01/09/14  4:51 PM      Result Value Ref Range   hCG, Beta Chain, Quant, S 9 (*) <5 mIU/mL     Assessment and Plan  A:  Bacterial Vaginosis      Residual vs new Quant HCG value  P:  Discussed with Dr Debroah Loop       Will treat BV        Repeat Quant HCG in one week in clinic  [facility] 01/09/2014, 4:45 PM

## 2014-01-10 LAB — GC/CHLAMYDIA PROBE AMP
CT Probe RNA: NEGATIVE
GC Probe RNA: NEGATIVE

## 2014-01-16 ENCOUNTER — Other Ambulatory Visit: Payer: Self-pay

## 2014-01-26 ENCOUNTER — Inpatient Hospital Stay (HOSPITAL_COMMUNITY)
Admission: AD | Admit: 2014-01-26 | Discharge: 2014-01-26 | Disposition: A | Discharge status: Home / Self Care | Payer: Self-pay | Source: Ambulatory Visit | Attending: Obstetrics and Gynecology | Admitting: Obstetrics and Gynecology

## 2014-01-26 ENCOUNTER — Encounter (HOSPITAL_COMMUNITY): Payer: Self-pay | Admitting: *Deleted

## 2014-01-26 DIAGNOSIS — B3731 Acute candidiasis of vulva and vagina: Secondary | ICD-10-CM | POA: Insufficient documentation

## 2014-01-26 DIAGNOSIS — A499 Bacterial infection, unspecified: Secondary | ICD-10-CM | POA: Insufficient documentation

## 2014-01-26 DIAGNOSIS — N390 Urinary tract infection, site not specified: Secondary | ICD-10-CM | POA: Insufficient documentation

## 2014-01-26 DIAGNOSIS — B9689 Other specified bacterial agents as the cause of diseases classified elsewhere: Secondary | ICD-10-CM | POA: Insufficient documentation

## 2014-01-26 DIAGNOSIS — K219 Gastro-esophageal reflux disease without esophagitis: Secondary | ICD-10-CM | POA: Insufficient documentation

## 2014-01-26 DIAGNOSIS — F17200 Nicotine dependence, unspecified, uncomplicated: Secondary | ICD-10-CM | POA: Insufficient documentation

## 2014-01-26 DIAGNOSIS — F319 Bipolar disorder, unspecified: Secondary | ICD-10-CM | POA: Insufficient documentation

## 2014-01-26 DIAGNOSIS — B373 Candidiasis of vulva and vagina: Secondary | ICD-10-CM | POA: Insufficient documentation

## 2014-01-26 DIAGNOSIS — N760 Acute vaginitis: Secondary | ICD-10-CM | POA: Insufficient documentation

## 2014-01-26 LAB — WET PREP, GENITAL: TRICH WET PREP: NONE SEEN

## 2014-01-26 LAB — URINALYSIS, ROUTINE W REFLEX MICROSCOPIC
Bilirubin Urine: NEGATIVE
Glucose, UA: NEGATIVE mg/dL
KETONES UR: NEGATIVE mg/dL
Nitrite: NEGATIVE
Protein, ur: NEGATIVE mg/dL
UROBILINOGEN UA: 0.2 mg/dL (ref 0.0–1.0)
pH: 6 (ref 5.0–8.0)

## 2014-01-26 LAB — URINE MICROSCOPIC-ADD ON

## 2014-01-26 LAB — HCG, QUANTITATIVE, PREGNANCY: hCG, Beta Chain, Quant, S: 1 m[IU]/mL (ref <5)

## 2014-01-26 LAB — POCT PREGNANCY, URINE: Preg Test, Ur: NEGATIVE

## 2014-01-26 MED ORDER — CIPROFLOXACIN HCL 500 MG PO TABS: 500.0000 mg | ORAL_TABLET | Freq: Two times a day (BID) | ORAL | Status: DC

## 2014-01-26 MED ORDER — OXYCODONE-ACETAMINOPHEN 5-325 MG PO TABS
1.0000 | ORAL_TABLET | Freq: Once | ORAL | Status: AC
  Administered 2014-01-26: 1 via ORAL
  Filled 2014-01-26: qty 1

## 2014-01-26 MED ORDER — FLUCONAZOLE 150 MG PO TABS: 150.0000 mg | ORAL_TABLET | Freq: Every day | ORAL | Status: DC

## 2014-01-26 NOTE — MAU Provider Note (Signed)
History     CSN: [id number]  Arrival date and time: 01/26/14 1432   First Provider Initiated Contact with Patient 01/26/14 1449      Chief Complaint  Patient presents with  . Vaginal Discharge  . Dysuria  . Abdominal Pain   HPI Comments: Natalie Fischer 33 y.o. [id number] presents to MAU with vaginal discharge and pelvic pains. She had been on Flagyl and developed what she thought was a yeast infection and took Monistat. She is currently sexually active without birth control. She states is does not want to become pregnant. She is S/P ectopic pregnancy with methotrexate. She did not follow quants down and is agreeable to have lab drawn today.  Vaginal Discharge The patient's primary symptoms include a vaginal discharge. Associated symptoms include abdominal pain and dysuria.  Dysuria   Abdominal Pain Associated symptoms include dysuria.      Past Medical History  Diagnosis Date  . GERD (gastroesophageal reflux disease)   . Ectopic pregnancy 2008 and 2014  . Bipolar 1 disorder     Past Surgical History  Procedure Laterality Date  . Ectopic pregnancy surgery      Family History  Problem Relation Age of Onset  . Cancer Sister   . Alcohol abuse Neg Hx   . Arthritis Neg Hx   . Asthma Neg Hx   . Birth defects Neg Hx   . COPD Neg Hx   . Depression Neg Hx   . Diabetes Neg Hx   . Drug abuse Neg Hx   . Early death Neg Hx   . Hearing loss Neg Hx   . Heart disease Neg Hx   . Hyperlipidemia Neg Hx   . Hypertension Neg Hx   . Kidney disease Neg Hx   . Learning disabilities Neg Hx   . Mental illness Neg Hx   . Mental retardation Neg Hx   . Miscarriages / Stillbirths Neg Hx   . Stroke Neg Hx   . Vision loss Neg Hx   . Varicose Veins Neg Hx     History  Substance Use Topics  . Smoking status: Current Some Day Smoker -- 0.25 packs/day  . Smokeless tobacco: Never Used  . Alcohol Use: Yes    Allergies: No Known Allergies  Prescriptions prior to admission   Medication Sig Dispense Refill  . ibuprofen (ADVIL,MOTRIN) 200 MG tablet Take 800 mg by mouth every 6 (six) hours as needed for moderate pain.      . metroNIDAZOLE (FLAGYL) 500 MG tablet Take 500 mg by mouth 2 (two) times daily.      . miconazole (MONISTAT 1 COMBINATION PACK) kit Place 1 each vaginally once.      Marland Kitchen NIACIN PO Take 1 tablet by mouth daily as needed (pt states for digestion).      . Prenatal Vit-Fe Fumarate-FA (PRENATAL MULTIVITAMIN) TABS tablet Take 1 tablet by mouth daily at 12 noon.        Review of Systems  Gastrointestinal: Positive for abdominal pain.  Genitourinary: Positive for dysuria and vaginal discharge.   Physical Exam   Blood pressure 111/76, pulse 97, temperature 98.5 F (36.9 C), temperature source Oral, resp. rate 18, height 5' 8.5" (1.74 m), weight 51.937 kg (114 lb 8 oz), last menstrual period 01/13/2014.  Physical Exam  Constitutional: She is oriented to person, place, and time. She appears well-developed and well-nourished. No distress.  HENT:  Head: Normocephalic and atraumatic.  GI: Soft. She exhibits no distension and no mass. There  is no tenderness. There is no rebound and no guarding.  Genitourinary:  Genital:external negative Vaginal:thick white chunky discharge Cervix:closed/ thick/ no CMT Bimanual: mildly tender uterus   Musculoskeletal: Normal range of motion.  Neurological: She is alert and oriented to person, place, and time.  Skin: Skin is warm and dry.  Psychiatric: Her mood appears anxious.   Results for orders placed during the hospital encounter of 01/26/14 (from the past 24 hour(s))  URINALYSIS, ROUTINE W REFLEX MICROSCOPIC     Status: Abnormal   Collection Time    01/26/14  2:42 PM      Result Value Ref Range   Color, Urine YELLOW  YELLOW   APPearance HAZY (*) CLEAR   Specific Gravity, Urine >1.030 (*) 1.005 - 1.030   pH 6.0  5.0 - 8.0   Glucose, UA NEGATIVE  NEGATIVE mg/dL   Hgb urine dipstick MODERATE (*) NEGATIVE    Bilirubin Urine NEGATIVE  NEGATIVE   Ketones, ur NEGATIVE  NEGATIVE mg/dL   Protein, ur NEGATIVE  NEGATIVE mg/dL   Urobilinogen, UA 0.2  0.0 - 1.0 mg/dL   Nitrite NEGATIVE  NEGATIVE   Leukocytes, UA SMALL (*) NEGATIVE  URINE MICROSCOPIC-ADD ON     Status: Abnormal   Collection Time    01/26/14  2:42 PM      Result Value Ref Range   Squamous Epithelial / LPF FEW (*) RARE   WBC, UA 21-50  <3 WBC/hpf   RBC / HPF 3-6  <3 RBC/hpf   Bacteria, UA MANY (*) RARE  WET PREP, GENITAL     Status: Abnormal   Collection Time    01/26/14  2:54 PM      Result Value Ref Range   Yeast Wet Prep HPF POC FEW (*) NONE SEEN   Trich, Wet Prep NONE SEEN  NONE SEEN   Clue Cells Wet Prep HPF POC MODERATE (*) NONE SEEN   WBC, Wet Prep HPF POC MODERATE BACTERIA SEEN (*) NONE SEEN  POCT PREGNANCY, URINE     Status: None   Collection Time    01/26/14  3:04 PM      Result Value Ref Range   Preg Test, Ur NEGATIVE  NEGATIVE    MAU Course  Procedures  MDM  Percocet x 1 dose for pain Wet prep/ UA/ GC/ Chlamydia/ urine culture  Assessment and Plan   A: Vaginal Yeast Unresolved BV UTI  P: Cipro 500 mg po BID x 5 days Diflucan with refill Repeat quant Discussed reoccurring BV  Follow up [facility] as needed   [name] 01/26/2014, 3:43 PM

## 2014-01-26 NOTE — MAU Note (Signed)
Patient presents with vaginal discharge, painful urination and abdominal pain 3-4 days.

## 2014-01-26 NOTE — Discharge Instructions (Signed)
Candida Infection, Adult A candida infection (also called yeast, fungus and Monilia infection) is an overgrowth of yeast that can occur anywhere on the body. A yeast infection commonly occurs in warm, moist body areas. Usually, the infection remains localized but can spread to become a systemic infection. A yeast infection may be a sign of a more severe disease such as diabetes, leukemia, or AIDS. A yeast infection can occur in both men and women. In women, Candida vaginitis is a vaginal infection. It is one of the most common causes of vaginitis. Men usually do not have symptoms or know they have an infection until other problems develop. Men may find out they have a yeast infection because their sex partner has a yeast infection. Uncircumcised men are more likely to get a yeast infection than circumcised men. This is because the uncircumcised glans is not exposed to air and does not remain as dry as that of a circumcised glans. Older adults may develop yeast infections around dentures. CAUSES  Women  Antibiotics.  Steroid medication taken for a long time.  Being overweight (obese).  Diabetes.  Poor immune condition.  Certain serious medical conditions.  Immune suppressive medications for organ transplant patients.  Chemotherapy.  Pregnancy.  Menstration.  Stress and fatigue.  Intravenous drug use.  Oral contraceptives.  Wearing tight-fitting clothes in the crotch area.  Catching it from a sex partner who has a yeast infection.  Spermicide.  Intravenous, urinary, or other catheters. Men  Catching it from a sex partner who has a yeast infection.  Having oral or anal sex with a person who has the infection.  Spermicide.  Diabetes.  Antibiotics.  Poor immune system.  Medications that suppress the immune system.  Intravenous drug use.  Intravenous, urinary, or other catheters. SYMPTOMS  Women  Thick, white vaginal discharge.  Vaginal itching.  Redness and  swelling in and around the vagina.  Irritation of the lips of the vagina and perineum.  Blisters on the vaginal lips and perineum.  Painful sexual intercourse.  Low blood sugar (hypoglycemia).  Painful urination.  Bladder infections.  Intestinal problems such as constipation, indigestion, bad breath, bloating, increase in gas, diarrhea, or loose stools. Men  Men may develop intestinal problems such as constipation, indigestion, bad breath, bloating, increase in gas, diarrhea, or loose stools.  Dry, cracked skin on the penis with itching or discomfort.  Jock itch.  Dry, flaky skin.  Athlete's foot.  Hypoglycemia. DIAGNOSIS  Women  A history and an exam are performed.  The discharge may be examined under a microscope.  A culture may be taken of the discharge. Men  A history and an exam are performed.  Any discharge from the penis or areas of cracked skin will be looked at under the microscope and cultured.  Stool samples may be cultured. TREATMENT  Women  Vaginal antifungal suppositories and creams.  Medicated creams to decrease irritation and itching on the outside of the vagina.  Warm compresses to the perineal area to decrease swelling and discomfort.  Oral antifungal medications.  Medicated vaginal suppositories or cream for repeated or recurrent infections.  Wash and dry the irritation areas before applying the cream.  Eating yogurt with lactobacillus may help with prevention and treatment.  Sometimes painting the vagina with gentian violet solution may help if creams and suppositories do not work. Men  Antifungal creams and oral antifungal medications.  Sometimes treatment must continue for 30 days after the symptoms go away to prevent recurrence. HOME CARE   INSTRUCTIONS  Women  Use cotton underwear and avoid tight-fitting clothing.  Avoid colored, scented toilet paper and deodorant tampons or pads.  Do not douche.  Keep your diabetes  under control.  Finish all the prescribed medications.  Keep your skin clean and dry.  Consume milk or yogurt with lactobacillus active culture regularly. If you get frequent yeast infections and think that is what the infection is, there are over-the-counter medications that you can get. If the infection does not show healing in 3 days, talk to your caregiver.  Tell your sex partner you have a yeast infection. Your partner may need treatment also, especially if your infection does not clear up or recurs. Men  Keep your skin clean and dry.  Keep your diabetes under control.  Finish all prescribed medications.  Tell your sex partner that you have a yeast infection so they can be treated if necessary. SEEK MEDICAL CARE IF:   Your symptoms do not clear up or worsen in one week after treatment.  You have an oral temperature above 102 F (38.9 C).  You have trouble swallowing or eating for a prolonged time.  You develop blisters on and around your vagina.  You develop vaginal bleeding and it is not your menstrual period.  You develop abdominal pain.  You develop intestinal problems as mentioned above.  You get weak or lightheaded.  You have painful or increased urination.  You have pain during sexual intercourse. MAKE SURE YOU:   Understand these instructions.  Will watch your condition.  Will get help right away if you are not doing well or get worse. Document Released: 11/17/2004 Document Revised: 01/02/2012 Document Reviewed: 03/01/2010 [facility] Patient Information 2014 [place], [place]. Urinary Tract Infection A urinary tract infection (UTI) can occur any place along the urinary tract. The tract includes the kidneys, ureters, bladder, and urethra. A type of germ called bacteria often causes a UTI. UTIs are often helped with antibiotic medicine.  HOME CARE   If given, take antibiotics as told by your doctor. Finish them even if you start to feel better.  Drink  enough fluids to keep your pee (urine) clear or pale yellow.  Avoid tea, drinks with caffeine, and bubbly (carbonated) drinks.  Pee often. Avoid holding your pee in for a long time.  Pee before and after having sex (intercourse).  Wipe from front to back after you poop (bowel movement) if you are a woman. Use each tissue only once. GET HELP RIGHT AWAY IF:   You have back pain.  You have lower belly (abdominal) pain.  You have chills.  You feel sick to your stomach (nauseous).  You throw up (vomit).  Your burning or discomfort with peeing does not go away.  You have a fever.  Your symptoms are not better in 3 days. MAKE SURE YOU:   Understand these instructions.  Will watch your condition.  Will get help right away if you are not doing well or get worse. Document Released: 03/28/2008 Document Revised: 07/04/2012 Document Reviewed: 05/10/2012 [facility] Patient Information 2014 [place], [place]. Bacterial Vaginosis Bacterial vaginosis is a vaginal infection that occurs when the normal balance of bacteria in the vagina is disrupted. It results from an overgrowth of certain bacteria. This is the most common vaginal infection in women of childbearing age. Treatment is important to prevent complications, especially in pregnant women, as it can cause a premature delivery. CAUSES  Bacterial vaginosis is caused by an increase in harmful bacteria that are normally present  in smaller amounts in the vagina. Several different kinds of bacteria can cause bacterial vaginosis. However, the reason that the condition develops is not fully understood. RISK FACTORS Certain activities or behaviors can put you at an increased risk of developing bacterial vaginosis, including:  Having a new sex partner or multiple sex partners.  Douching.  Using an intrauterine device (IUD) for contraception. Women do not get bacterial vaginosis from toilet seats, bedding, swimming pools, or contact with objects  around them. SIGNS AND SYMPTOMS  Some women with bacterial vaginosis have no signs or symptoms. Common symptoms include:  Grey vaginal discharge.  A fishlike odor with discharge, especially after sexual intercourse.  Itching or burning of the vagina and vulva.  Burning or pain with urination. DIAGNOSIS  Your health care provider will take a medical history and examine the vagina for signs of bacterial vaginosis. A sample of vaginal fluid may be taken. Your health care provider will look at this sample under a microscope to check for bacteria and abnormal cells. A vaginal pH test may also be done.  TREATMENT  Bacterial vaginosis may be treated with antibiotic medicines. These may be given in the form of a pill or a vaginal cream. A second round of antibiotics may be prescribed if the condition comes back after treatment.  HOME CARE INSTRUCTIONS   Only take over-the-counter or prescription medicines as directed by your health care provider.  If antibiotic medicine was prescribed, take it as directed. Make sure you finish it even if you start to feel better.  Do not have sex until treatment is completed.  Tell all sexual partners that you have a vaginal infection. They should see their health care provider and be treated if they have problems, such as a mild rash or itching.  Practice safe sex by using condoms and only having one sex partner. SEEK MEDICAL CARE IF:   Your symptoms are not improving after 3 days of treatment.  You have increased discharge or pain.  You have a fever. MAKE SURE YOU:   Understand these instructions.  Will watch your condition.  Will get help right away if you are not doing well or get worse. FOR MORE INFORMATION  Centers for Disease Control and Prevention, Division of STD Prevention: SolutionApps.co.zawww.cdc.gov/std American Sexual Health Association (ASHA): www.ashastd.org  Document Released: 10/10/2005 Document Revised: 07/31/2013 Document Reviewed:  05/22/2013 [facility] Patient Information 2014 [place]

## 2014-01-27 LAB — GC/CHLAMYDIA PROBE AMP
CT PROBE, AMP APTIMA: NEGATIVE
GC PROBE AMP APTIMA: NEGATIVE

## 2014-01-28 NOTE — MAU Provider Note (Signed)
`````

## 2014-01-29 LAB — URINE CULTURE

## 2014-02-03 ENCOUNTER — Emergency Department (HOSPITAL_COMMUNITY)
Admission: EM | Admit: 2014-02-03 | Discharge: 2014-02-03 | Disposition: A | Discharge status: Home / Self Care | Payer: Self-pay | Attending: Emergency Medicine | Admitting: Emergency Medicine

## 2014-02-03 ENCOUNTER — Encounter (HOSPITAL_COMMUNITY): Payer: Self-pay | Admitting: Emergency Medicine

## 2014-02-03 ENCOUNTER — Emergency Department (HOSPITAL_COMMUNITY): Payer: Self-pay

## 2014-02-03 DIAGNOSIS — S40011A Contusion of right shoulder, initial encounter: Secondary | ICD-10-CM

## 2014-02-03 DIAGNOSIS — Z8719 Personal history of other diseases of the digestive system: Secondary | ICD-10-CM | POA: Insufficient documentation

## 2014-02-03 DIAGNOSIS — S40019A Contusion of unspecified shoulder, initial encounter: Secondary | ICD-10-CM | POA: Insufficient documentation

## 2014-02-03 DIAGNOSIS — Z79899 Other long term (current) drug therapy: Secondary | ICD-10-CM | POA: Insufficient documentation

## 2014-02-03 DIAGNOSIS — Z792 Long term (current) use of antibiotics: Secondary | ICD-10-CM | POA: Insufficient documentation

## 2014-02-03 DIAGNOSIS — F17200 Nicotine dependence, unspecified, uncomplicated: Secondary | ICD-10-CM | POA: Insufficient documentation

## 2014-02-03 DIAGNOSIS — Z8659 Personal history of other mental and behavioral disorders: Secondary | ICD-10-CM | POA: Insufficient documentation

## 2014-02-03 MED ORDER — HYDROCODONE-ACETAMINOPHEN 5-325 MG PO TABS: 1.0000 | ORAL_TABLET | ORAL | Status: DC | PRN

## 2014-02-03 MED ORDER — IBUPROFEN 800 MG PO TABS: 800.0000 mg | ORAL_TABLET | Freq: Three times a day (TID) | ORAL | Status: DC

## 2014-02-03 NOTE — Discharge Instructions (Signed)
Take vicodin as prescribed for severe pain.  Do not drive within four hours of taking this medication (may cause drowsiness or confusion).   Take ibuprofen with food, up to 800mg  three times a day, as needed for pain.  Ice 3 times a day for 15-20 minutes.  Activity as tolerated.  Follow up with the orthopedist if your pain has not started to improve in 5-7 days, or you develop weakness of the injured joint.   You may return to the ER if your pain worsens or you have any other concerns.

## 2014-02-03 NOTE — ED Notes (Signed)
Per pt sts that she is having right shoulder pain and right arm pain after a fall about 3 weeks ago.
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
## 2014-02-04 NOTE — ED Provider Notes (Signed)
Medical screening examination/treatment/procedure(s) were performed by non-physician practitioner and as supervising physician I was immediately available for consultation/collaboration.   EKG Interpretation None       Glynn OctaveStephen Kannen Moxey, MD 02/04/14 1051

## 2014-02-07 ENCOUNTER — Ambulatory Visit: Payer: Self-pay | Attending: Internal Medicine

## 2014-03-14 ENCOUNTER — Encounter (HOSPITAL_COMMUNITY): Payer: Self-pay | Admitting: *Deleted

## 2014-03-14 ENCOUNTER — Inpatient Hospital Stay (HOSPITAL_COMMUNITY)
Admission: AD | Admit: 2014-03-14 | Discharge: 2014-03-14 | Disposition: A | Discharge status: Home / Self Care | Payer: Self-pay | Source: Ambulatory Visit | Attending: Obstetrics & Gynecology | Admitting: Obstetrics & Gynecology

## 2014-03-14 DIAGNOSIS — N760 Acute vaginitis: Secondary | ICD-10-CM | POA: Insufficient documentation

## 2014-03-14 DIAGNOSIS — K219 Gastro-esophageal reflux disease without esophagitis: Secondary | ICD-10-CM | POA: Insufficient documentation

## 2014-03-14 DIAGNOSIS — A499 Bacterial infection, unspecified: Secondary | ICD-10-CM

## 2014-03-14 DIAGNOSIS — B9689 Other specified bacterial agents as the cause of diseases classified elsewhere: Secondary | ICD-10-CM | POA: Insufficient documentation

## 2014-03-14 HISTORY — DX: Insomnia, unspecified: G47.00

## 2014-03-14 HISTORY — DX: Constipation, unspecified: K59.00

## 2014-03-14 HISTORY — DX: Chlamydial infection, unspecified: A74.9

## 2014-03-14 HISTORY — DX: Urinary tract infection, site not specified: N39.0

## 2014-03-14 HISTORY — DX: Anxiety disorder, unspecified: F41.9

## 2014-03-14 HISTORY — DX: Trichomonal vulvovaginitis: A59.01

## 2014-03-14 LAB — URINALYSIS, ROUTINE W REFLEX MICROSCOPIC
Bilirubin Urine: NEGATIVE
GLUCOSE, UA: NEGATIVE mg/dL
HGB URINE DIPSTICK: NEGATIVE
Ketones, ur: NEGATIVE mg/dL
Leukocytes, UA: NEGATIVE
Nitrite: NEGATIVE
PROTEIN: NEGATIVE mg/dL
Specific Gravity, Urine: >1.03 — ABNORMAL HIGH (ref 1.005–1.030)
Urobilinogen, UA: 0.2 mg/dL (ref 0.0–1.0)
pH: 5.5 (ref 5.0–8.0)

## 2014-03-14 LAB — POCT PREGNANCY, URINE: Preg Test, Ur: NEGATIVE

## 2014-03-14 LAB — WET PREP, GENITAL
Trich, Wet Prep: NONE SEEN
Yeast Wet Prep HPF POC: NONE SEEN

## 2014-03-14 MED ORDER — FLUCONAZOLE 150 MG PO TABS: 150.0000 mg | ORAL_TABLET | Freq: Every day | ORAL | Status: DC

## 2014-03-14 MED ORDER — METRONIDAZOLE 500 MG PO TABS: 500.0000 mg | ORAL_TABLET | Freq: Two times a day (BID) | ORAL | Status: DC

## 2014-03-14 NOTE — MAU Provider Note (Signed)
Attestation of Attending Supervision of Advanced Practitioner (CNM/NP): Evaluation and management procedures were performed by the Advanced Practitioner under my supervision and collaboration.  I have reviewed the Advanced Practitioner's note and chart, and I agree with the management and plan.  Seth Higginbotham Harraway-Ewen 1:50 PM

## 2014-03-14 NOTE — MAU Provider Note (Signed)
History     CSN: [id number]  Arrival date and time: 03/14/14 1010   First Provider Initiated Contact with Patient 03/14/14 1213      No chief complaint on file.  HPI  Pt is a 33 y.o. female G2P0020 who presents with abnormal vaginal discharge that started 1 week ago. She is here for STI testing. Symptoms include malodorous white discharge. She is requesting diflucan if her culture is positive for BV. States she gets a yeast infection every time she is treated for BV.   OB History   Grav Para Term Preterm Abortions TAB SAB Ect Mult Living   2    2   2         Past Medical History  Diagnosis Date  . GERD (gastroesophageal reflux disease)   . Ectopic pregnancy 2008 and 2014  . Bipolar 1 disorder   . UTI (lower urinary tract infection)   . Trichomonas vaginitis   . Chlamydia   . Anxiety   . Insomnia   . Constipation     Past Surgical History  Procedure Laterality Date  . Ectopic pregnancy surgery      Family History  Problem Relation Age of Onset  . Cancer Sister   . Alcohol abuse Neg Hx   . Arthritis Neg Hx   . Asthma Neg Hx   . Birth defects Neg Hx   . COPD Neg Hx   . Depression Neg Hx   . Diabetes Neg Hx   . Drug abuse Neg Hx   . Early death Neg Hx   . Hearing loss Neg Hx   . Heart disease Neg Hx   . Hyperlipidemia Neg Hx   . Hypertension Neg Hx   . Kidney disease Neg Hx   . Learning disabilities Neg Hx   . Mental illness Neg Hx   . Mental retardation Neg Hx   . Miscarriages / Stillbirths Neg Hx   . Stroke Neg Hx   . Vision loss Neg Hx   . Varicose Veins Neg Hx     History  Substance Use Topics  . Smoking status: Current Some Day Smoker -- 0.25 packs/day    Types: Cigarettes  . Smokeless tobacco: Never Used  . Alcohol Use: Yes     Comment: social    Allergies: No Known Allergies  Prescriptions prior to admission  Medication Sig Dispense Refill  . carbamazepine (TEGRETOL XR) 400 MG 12 hr tablet Take 400 mg by mouth at bedtime.      Marland Kitchen.  ibuprofen (ADVIL,MOTRIN) 200 MG tablet Take 800 mg by mouth every 6 (six) hours as needed for moderate pain.      . Prenatal Vit-Fe Fumarate-FA (PRENATAL MULTIVITAMIN) TABS tablet Take 1 tablet by mouth daily at 12 noon.       Results for orders placed during the hospital encounter of 03/14/14 (from the past 48 hour(s))  POCT PREGNANCY, URINE     Status: None   Collection Time    03/14/14 10:37 AM      Result Value Ref Range   Preg Test, Ur NEGATIVE  NEGATIVE   Comment:            THE SENSITIVITY OF THIS     METHODOLOGY IS >24 mIU/mL  URINALYSIS, ROUTINE W REFLEX MICROSCOPIC     Status: Abnormal   Collection Time    03/14/14 10:39 AM      Result Value Ref Range   Color, Urine YELLOW  YELLOW   APPearance HAZY (*)  CLEAR   Specific Gravity, Urine >1.030 (*) 1.005 - 1.030   pH 5.5  5.0 - 8.0   Glucose, UA NEGATIVE  NEGATIVE mg/dL   Hgb urine dipstick NEGATIVE  NEGATIVE   Bilirubin Urine NEGATIVE  NEGATIVE   Ketones, ur NEGATIVE  NEGATIVE mg/dL   Protein, ur NEGATIVE  NEGATIVE mg/dL   Urobilinogen, UA 0.2  0.0 - 1.0 mg/dL   Nitrite NEGATIVE  NEGATIVE   Leukocytes, UA NEGATIVE  NEGATIVE   Comment: MICROSCOPIC NOT DONE ON URINES WITH NEGATIVE PROTEIN, BLOOD, LEUKOCYTES, NITRITE, OR GLUCOSE <1000 mg/dL.  WET PREP, GENITAL     Status: Abnormal   Collection Time    03/14/14 12:27 PM      Result Value Ref Range   Yeast Wet Prep HPF POC NONE SEEN  NONE SEEN   Trich, Wet Prep NONE SEEN  NONE SEEN   Clue Cells Wet Prep HPF POC MODERATE (*) NONE SEEN   WBC, Wet Prep HPF POC FEW (*) NONE SEEN   Comment: BACTERIA- TOO NUMEROUS TO COUNT    Review of Systems  Constitutional: Negative for fever and chills.  Gastrointestinal: Positive for abdominal pain (+ bilateral lower abdominal cramping ). Negative for nausea, vomiting, diarrhea and constipation.  Genitourinary: Negative for dysuria, urgency, frequency and hematuria.  Musculoskeletal: Negative for back pain.   Physical Exam   Blood  pressure 120/83, pulse 77, temperature 98.4 F (36.9 C), temperature source Oral, resp. rate 16, height 5\' 11"  (1.803 m), weight 51.71 kg (114 lb), last menstrual period 03/04/2014.  Physical Exam  Constitutional: She is oriented to person, place, and time. She appears well-developed and well-nourished.  HENT:  Head: Normocephalic.  Eyes: Pupils are equal, round, and reactive to light.  Neck: Neck supple.  Respiratory: Effort normal.  GI: Soft. She exhibits no distension. There is no tenderness. There is no rebound and no guarding.  Genitourinary:  Speculum exam: Vagina - Small amount of creamy discharge, Strong fishy odor Cervix - No contact bleeding Bimanual exam: Cervix closed, No CMT  Uterus non tender, normal size Adnexa non tender, no masses bilaterally GC/Chlam, wet prep done Chaperone present for exam.   Musculoskeletal: Normal range of motion.  Neurological: She is alert and oriented to person, place, and time.  Skin: Skin is warm.  Psychiatric: Her behavior is normal.    MAU Course  Procedures None  MDM UA Urine preg GC Wet preg  Assessment and Plan   A:  Bacterial vaginosis  P:  Discharge home in stable condition RX: Flagyl; no alcohol        Diflucan Follow up with the HD for further STI testing  Condoms always   Iona Hansen Emonnie Cannady, NP 03/14/2014, 12:14 PM

## 2014-03-14 NOTE — MAU Note (Signed)
Pt states she's been on a medication and is now having some vaginal discharge which is very irritating.  No vaginal bleeding or abnormal smell.  Pt states, "something's just not right".  Pt is experiencing some abd pain but states she does have some cyst.  Possible pregnancy.

## 2014-03-14 NOTE — Discharge Instructions (Signed)
Bacterial Vaginosis Bacterial vaginosis is an infection of the vagina. It happens when too many of certain germs (bacteria) grow in the vagina. HOME CARE  Take your medicine as told by your doctor.  Finish your medicine even if you start to feel better.  Do not have sex until you finish your medicine and are better.  Tell your sex partner that you have an infection. They should see their doctor for treatment.  Practice safe sex. Use condoms. Have only one sex partner. GET HELP IF:  You are not getting better after 3 days of treatment.  You have more grey fluid (discharge) coming from your vagina than before.  You have more pain than before.  You have a fever. MAKE SURE YOU:   Understand these instructions.  Will watch your condition.  Will get help right away if you are not doing well or get worse. Document Released: 07/19/2008 Document Revised: 07/31/2013 Document Reviewed: 05/22/2013 [employer] Patient Information 2014 [employer].  

## 2014-03-15 LAB — GC/CHLAMYDIA PROBE AMP
CT Probe RNA: NEGATIVE
GC PROBE AMP APTIMA: NEGATIVE

## 2014-04-01 ENCOUNTER — Inpatient Hospital Stay (HOSPITAL_COMMUNITY)
Admission: AD | Admit: 2014-04-01 | Discharge: 2014-04-01 | Disposition: A | Discharge status: Home / Self Care | Payer: Self-pay | Source: Ambulatory Visit | Attending: Family Medicine | Admitting: Family Medicine

## 2014-04-01 ENCOUNTER — Encounter (HOSPITAL_COMMUNITY): Payer: Self-pay | Admitting: *Deleted

## 2014-04-01 DIAGNOSIS — F319 Bipolar disorder, unspecified: Secondary | ICD-10-CM | POA: Insufficient documentation

## 2014-04-01 DIAGNOSIS — K219 Gastro-esophageal reflux disease without esophagitis: Secondary | ICD-10-CM | POA: Insufficient documentation

## 2014-04-01 DIAGNOSIS — F411 Generalized anxiety disorder: Secondary | ICD-10-CM | POA: Insufficient documentation

## 2014-04-01 DIAGNOSIS — R300 Dysuria: Secondary | ICD-10-CM | POA: Insufficient documentation

## 2014-04-01 DIAGNOSIS — F17200 Nicotine dependence, unspecified, uncomplicated: Secondary | ICD-10-CM | POA: Insufficient documentation

## 2014-04-01 DIAGNOSIS — G4700 Insomnia, unspecified: Secondary | ICD-10-CM | POA: Insufficient documentation

## 2014-04-01 DIAGNOSIS — N898 Other specified noninflammatory disorders of vagina: Secondary | ICD-10-CM | POA: Insufficient documentation

## 2014-04-01 DIAGNOSIS — K5900 Constipation, unspecified: Secondary | ICD-10-CM | POA: Insufficient documentation

## 2014-04-01 DIAGNOSIS — R109 Unspecified abdominal pain: Secondary | ICD-10-CM | POA: Insufficient documentation

## 2014-04-01 LAB — URINALYSIS, ROUTINE W REFLEX MICROSCOPIC
Bilirubin Urine: NEGATIVE
Glucose, UA: NEGATIVE mg/dL
Ketones, ur: NEGATIVE mg/dL
Nitrite: NEGATIVE
Protein, ur: NEGATIVE mg/dL
SPECIFIC GRAVITY, URINE: 1.02 (ref 1.005–1.030)
UROBILINOGEN UA: 0.2 mg/dL (ref 0.0–1.0)
pH: 6 (ref 5.0–8.0)

## 2014-04-01 LAB — URINE MICROSCOPIC-ADD ON

## 2014-04-01 LAB — WET PREP, GENITAL
Clue Cells Wet Prep HPF POC: NONE SEEN
TRICH WET PREP: NONE SEEN
Yeast Wet Prep HPF POC: NONE SEEN

## 2014-04-01 LAB — RPR

## 2014-04-01 MED ORDER — LIDOCAINE HCL 2 % EX GEL
Freq: Once | CUTANEOUS | Status: AC
  Administered 2014-04-01: 5 via TOPICAL
  Filled 2014-04-01: qty 5

## 2014-04-01 NOTE — MAU Provider Note (Signed)
History     CSN: [id number]  Arrival date and time: 04/01/14 1449   First Provider Initiated Contact with Patient 04/01/14 1535      Chief Complaint  Patient presents with  . Dysuria   Dysuria  Pertinent negatives include no chills, frequency, hematuria, nausea, urgency or vomiting.   Natalie Fischer is a 33 yo female who presents to MAU today complaining of dysuria x 1 day and clear vaginal discharge.  Patient describes the dysuria as a burning sensation upon urination when urine hits vaginal labia.  Patient began her menstrual cycle this am, but denies previous hematuria, frequency, or urgency.  Patient admits to  abdominal pain that started this morning w/ her menstrual cycle, but no other associated pain.  Patient was treated for BV and a yeast infection on 5/22 and was treated w/ Flagyl and  Diflucan.  Denies fever, chill, malaise.  Denies NVD or constipation.  Patient has a significant PMH for CH, Trich, yeast infections and UTIs.  Pt reports using lubricating gel last night and pain started this am upon waking.    Past Medical History  Diagnosis Date  . GERD (gastroesophageal reflux disease)   . Ectopic pregnancy 2008 and 2014  . Bipolar 1 disorder   . UTI (lower urinary tract infection)   . Trichomonas vaginitis   . Chlamydia   . Anxiety   . Insomnia   . Constipation     Past Surgical History  Procedure Laterality Date  . Ectopic pregnancy surgery      Family History  Problem Relation Age of Onset  . Cancer Sister   . Alcohol abuse Neg Hx   . Arthritis Neg Hx   . Asthma Neg Hx   . Birth defects Neg Hx   . COPD Neg Hx   . Depression Neg Hx   . Diabetes Neg Hx   . Drug abuse Neg Hx   . Early death Neg Hx   . Hearing loss Neg Hx   . Heart disease Neg Hx   . Hyperlipidemia Neg Hx   . Hypertension Neg Hx   . Kidney disease Neg Hx   . Learning disabilities Neg Hx   . Mental illness Neg Hx   . Mental retardation Neg Hx   . Miscarriages / Stillbirths Neg Hx   .  Stroke Neg Hx   . Vision loss Neg Hx   . Varicose Veins Neg Hx     History  Substance Use Topics  . Smoking status: Current Some Day Smoker -- 0.25 packs/day    Types: Cigarettes  . Smokeless tobacco: Never Used  . Alcohol Use: Yes     Comment: social    Allergies: No Known Allergies  Prescriptions prior to admission  Medication Sig Dispense Refill  . carbamazepine (TEGRETOL XR) 400 MG 12 hr tablet Take 400 mg by mouth at bedtime.      Marland Kitchen ibuprofen (ADVIL,MOTRIN) 200 MG tablet Take 800 mg by mouth every 6 (six) hours as needed for moderate pain.      . Prenatal Vit-Fe Fumarate-FA (PRENATAL MULTIVITAMIN) TABS tablet Take 1 tablet by mouth daily at 12 noon.      . fluconazole (DIFLUCAN) 150 MG tablet Take 1 tablet (150 mg total) by mouth daily.  2 tablet  0  . metroNIDAZOLE (FLAGYL) 500 MG tablet Take 1 tablet (500 mg total) by mouth 2 (two) times daily.  14 tablet  0    Review of Systems  Constitutional: Negative for fever,  chills and malaise/fatigue.  Respiratory: Negative.   Cardiovascular: Negative.   Gastrointestinal: Negative for nausea, vomiting, abdominal pain and diarrhea.  Genitourinary: Positive for dysuria. Negative for urgency, frequency and hematuria.       Patient confirms burning upon urination and details clear discharge  Musculoskeletal: Negative for myalgias.  Neurological: Negative for headaches.   Physical Exam   Blood pressure 118/75, pulse 100, temperature 98.6 F (37 C), temperature source Oral, resp. rate 16, weight 53.706 kg (118 lb 6.4 oz), last menstrual period 04/01/2014, SpO2 100.00%.  Physical Exam  Constitutional: She is oriented to person, place, and time. She appears well-developed and well-nourished.  HENT:  Head: Normocephalic and atraumatic.  Cardiovascular: Normal rate, regular rhythm and normal heart sounds.   Respiratory: Effort normal and breath sounds normal.  GI: Soft. Bowel sounds are normal. She exhibits no distension. There is  no tenderness. There is no rebound.  Genitourinary:    There is lesion on the right labia. There is lesion on the left labia. Right adnexum displays no tenderness. Left adnexum displays no tenderness. There is tenderness and bleeding around the vagina. No vaginal discharge found.  Symmetrical labial ulcerations Unable to differentiate vaginal discharge do to patients current menstrual cycle  Neurological: She is alert and oriented to person, place, and time.  Skin: Skin is warm and dry.  Psychiatric: She has a normal mood and affect. Her behavior is normal.   Results for orders placed during the hospital encounter of 04/01/14 (from the past 24 hour(s))  URINALYSIS, ROUTINE W REFLEX MICROSCOPIC     Status: Abnormal   Collection Time    04/01/14  3:05 PM      Result Value Ref Range   Color, Urine YELLOW  YELLOW   APPearance CLEAR  CLEAR   Specific Gravity, Urine 1.020  1.005 - 1.030   pH 6.0  5.0 - 8.0   Glucose, UA NEGATIVE  NEGATIVE mg/dL   Hgb urine dipstick LARGE (*) NEGATIVE   Bilirubin Urine NEGATIVE  NEGATIVE   Ketones, ur NEGATIVE  NEGATIVE mg/dL   Protein, ur NEGATIVE  NEGATIVE mg/dL   Urobilinogen, UA 0.2  0.0 - 1.0 mg/dL   Nitrite NEGATIVE  NEGATIVE   Leukocytes, UA SMALL (*) NEGATIVE  URINE MICROSCOPIC-ADD ON     Status: Abnormal   Collection Time    04/01/14  3:05 PM      Result Value Ref Range   Squamous Epithelial / LPF FEW (*) RARE   WBC, UA 3-6  <3 WBC/hpf   RBC / HPF TOO NUMEROUS TO COUNT  <3 RBC/hpf   Bacteria, UA FEW (*) RARE  WET PREP, GENITAL     Status: Abnormal   Collection Time    04/01/14  4:27 PM      Result Value Ref Range   Yeast Wet Prep HPF POC NONE SEEN  NONE SEEN   Trich, Wet Prep NONE SEEN  NONE SEEN   Clue Cells Wet Prep HPF POC NONE SEEN  NONE SEEN   WBC, Wet Prep HPF POC FEW (*) NONE SEEN   Results for orders placed during the hospital encounter of 04/01/14 (from the past 24 hour(s))  URINALYSIS, ROUTINE W REFLEX MICROSCOPIC     Status:  Abnormal   Collection Time    04/01/14  3:05 PM      Result Value Ref Range   Color, Urine YELLOW  YELLOW   APPearance CLEAR  CLEAR   Specific Gravity, Urine 1.020  1.005 -  1.030   pH 6.0  5.0 - 8.0   Glucose, UA NEGATIVE  NEGATIVE mg/dL   Hgb urine dipstick LARGE (*) NEGATIVE   Bilirubin Urine NEGATIVE  NEGATIVE   Ketones, ur NEGATIVE  NEGATIVE mg/dL   Protein, ur NEGATIVE  NEGATIVE mg/dL   Urobilinogen, UA 0.2  0.0 - 1.0 mg/dL   Nitrite NEGATIVE  NEGATIVE   Leukocytes, UA SMALL (*) NEGATIVE  URINE MICROSCOPIC-ADD ON     Status: Abnormal   Collection Time    04/01/14  3:05 PM      Result Value Ref Range   Squamous Epithelial / LPF FEW (*) RARE   WBC, UA 3-6  <3 WBC/hpf   RBC / HPF TOO NUMEROUS TO COUNT  <3 RBC/hpf   Bacteria, UA FEW (*) RARE  WET PREP, GENITAL     Status: Abnormal   Collection Time    04/01/14  4:27 PM      Result Value Ref Range   Yeast Wet Prep HPF POC NONE SEEN  NONE SEEN   Trich, Wet Prep NONE SEEN  NONE SEEN   Clue Cells Wet Prep HPF POC NONE SEEN  NONE SEEN   WBC, Wet Prep HPF POC FEW (*) NONE SEEN     MAU Course  Procedures  Lidocaine gel > pt reports relief on the area  Assessment and Plan  Vaginal Lesion - Infection vs Contact Dermatitis  Plan: HSV II culture collected GC/CT pending Vaginal culture collected RPR drawn Will call patient with results  Colon Branch L Antis  PA-S2 04/01/2014, 3:45 PM   I examined pt and agree with documentation above and PA-S plan of care. Eino Farber Paul Half, CNM

## 2014-04-01 NOTE — Progress Notes (Signed)
W. Muhammed CNM in earlier to discuss test results and d/c plan. Written and verbal d/c instructions given and understanding voiced. 

## 2014-04-01 NOTE — MAU Provider Note (Signed)
Attestation of Attending Supervision of Advanced Practitioner (PA/CNM/NP): Evaluation and management procedures were performed by the Advanced Practitioner under my supervision and collaboration.  I have reviewed the Advanced Practitioner's note and chart, and I agree with the management and plan.  Reva Bores, MD [facility] Attending 04/01/2014 5:42 PM

## 2014-04-01 NOTE — MAU Note (Signed)
Patient states she has had pain with urination since yesterday. Has vaginal irrtiation and pain.

## 2014-04-02 LAB — GC/CHLAMYDIA PROBE AMP
CT PROBE, AMP APTIMA: NEGATIVE
GC Probe RNA: NEGATIVE

## 2014-04-03 LAB — HERPES SIMPLEX VIRUS CULTURE
Culture: NOT DETECTED
SPECIAL REQUESTS: NORMAL

## 2014-04-05 LAB — WOUND CULTURE
Gram Stain: NONE SEEN
Special Requests: NORMAL

## 2014-04-09 ENCOUNTER — Telehealth: Payer: Self-pay | Admitting: Medical

## 2014-04-09 DIAGNOSIS — L0291 Cutaneous abscess, unspecified: Secondary | ICD-10-CM

## 2014-04-09 MED ORDER — SULFAMETHOXAZOLE-TMP DS 800-160 MG PO TABS: 1.0000 | ORAL_TABLET | Freq: Two times a day (BID) | ORAL | Status: DC

## 2014-04-09 NOTE — Telephone Encounter (Signed)
Patient returned call to MAU. Rx for Bactrim called into patient's pharmacy. Patient voiced understanding. Patient to return to MAU with any worsening symptoms.   Freddi StarrJulie N Ethier, PA-C 04/09/2014 6:01 PM

## 2014-04-13 ENCOUNTER — Encounter (HOSPITAL_COMMUNITY): Payer: Self-pay

## 2014-04-13 ENCOUNTER — Inpatient Hospital Stay (HOSPITAL_COMMUNITY)
Admission: AD | Admit: 2014-04-13 | Discharge: 2014-04-13 | Disposition: A | Discharge status: Home / Self Care | Payer: Self-pay | Source: Ambulatory Visit | Attending: Obstetrics & Gynecology | Admitting: Obstetrics & Gynecology

## 2014-04-13 DIAGNOSIS — Z87891 Personal history of nicotine dependence: Secondary | ICD-10-CM | POA: Insufficient documentation

## 2014-04-13 DIAGNOSIS — F319 Bipolar disorder, unspecified: Secondary | ICD-10-CM | POA: Insufficient documentation

## 2014-04-13 DIAGNOSIS — F411 Generalized anxiety disorder: Secondary | ICD-10-CM | POA: Insufficient documentation

## 2014-04-13 DIAGNOSIS — L0232 Furuncle of buttock: Secondary | ICD-10-CM

## 2014-04-13 DIAGNOSIS — K219 Gastro-esophageal reflux disease without esophagitis: Secondary | ICD-10-CM | POA: Insufficient documentation

## 2014-04-13 DIAGNOSIS — L0233 Carbuncle of buttock: Secondary | ICD-10-CM | POA: Insufficient documentation

## 2014-04-13 NOTE — MAU Note (Signed)
Pt presents with complaints of having a boil on her vaginal area. She reports that she popped it herself in the bathtub and it is now sore and hard

## 2014-04-13 NOTE — MAU Provider Note (Signed)
History     CSN: [id number]  Arrival date and time: 04/13/14 1216   First Provider Initiated Contact with Patient 04/13/14 1358      Chief Complaint  Patient presents with  . Abscess   Abscess   Natalie Fischer is a 33 y.o. G2P0020 who presents today with a boil on her buttock. She states that she was here and had one a couple of weeks ago. She states that someone called her, and told she needed an antibiotic. She states that she just got the yesterday. However, the boil had really increased in size over that time. She states that yesterday it opened up and pus was draining from it. She states that she, "just wanted us to see it". She has been applying bacitracin to it as well.   Past Medical History  Diagnosis Date  . GERD (gastroesophageal reflux disease)   . Ectopic pregnancy 2008 and 2014  . Bipolar 1 disorder   . UTI (lower urinary tract infection)   . Trichomonas vaginitis   . Chlamydia   . Anxiety   . Insomnia   . Constipation     Past Surgical History  Procedure Laterality Date  . Ectopic pregnancy surgery      Family History  Problem Relation Age of Onset  . Cancer Sister   . Alcohol abuse Neg Hx   . Arthritis Neg Hx   . Asthma Neg Hx   . Birth defects Neg Hx   . COPD Neg Hx   . Depression Neg Hx   . Diabetes Neg Hx   . Drug abuse Neg Hx   . Early death Neg Hx   . Hearing loss Neg Hx   . Heart disease Neg Hx   . Hyperlipidemia Neg Hx   . Hypertension Neg Hx   . Kidney disease Neg Hx   . Learning disabilities Neg Hx   . Mental illness Neg Hx   . Mental retardation Neg Hx   . Miscarriages / Stillbirths Neg Hx   . Stroke Neg Hx   . Vision loss Neg Hx   . Varicose Veins Neg Hx     History  Substance Use Topics  . Smoking status: Former Smoker -- 0.25 packs/day    Types: Cigarettes    Quit date: 04/12/2014  . Smokeless tobacco: Never Used  . Alcohol Use: Yes     Comment: social    Allergies: No Known Allergies  Prescriptions prior to  admission  Medication Sig Dispense Refill  . bacitracin 500 UNIT/GM ointment Apply 1 application topically as needed for wound care.      . carbamazepine (TEGRETOL XR) 400 MG 12 hr tablet Take 400 mg by mouth at bedtime.      Marland Kitchen. Hydrocortisone (MONISTAT SOOTHING CARE EX) Apply 1 application topically as needed (boil).      . hydrOXYzine (ATARAX/VISTARIL) 25 MG tablet Take 25 mg by mouth 2 (two) times daily.      Marland Kitchen. ibuprofen (ADVIL,MOTRIN) 200 MG tablet Take 600 mg by mouth every 6 (six) hours as needed for moderate pain.       Marland Kitchen. neomycin-bacitracin-polymyxin (NEOSPORIN) ointment Apply 1 application topically as needed for wound care. apply to eye      . Prenatal Vit-Fe Fumarate-FA (PRENATAL MULTIVITAMIN) TABS tablet Take 1 tablet by mouth daily at 12 noon.      . sulfamethoxazole-trimethoprim (BACTRIM DS) 800-160 MG per tablet Take 1 tablet by mouth 2 (two) times daily.  14 tablet  0  ROS Physical Exam   Blood pressure 145/83, pulse 79, temperature 98.7 F (37.1 C), resp. rate 20, last menstrual period 04/01/2014.  Physical Exam  Nursing note and vitals reviewed. Constitutional: She is oriented to person, place, and time. She appears well-developed and well-nourished. No distress.  Cardiovascular: Normal rate.   Respiratory: Effort normal.  GI: Soft. There is no tenderness.  Neurological: She is alert and oriented to person, place, and time.  Skin: Skin is warm and dry.  1 cm x 1 cm open abscess on the right buttock.   Psychiatric: She has a normal mood and affect.    MAU Course  Procedures   Assessment and Plan   1. Boil of buttock    Continue bactrim as prescribed Continue bacitracin Return to MAU as needed    Tawnya CrookHogan, Heather Donovan 04/13/2014, 2:16 PM

## 2014-04-13 NOTE — Discharge Instructions (Signed)
Abscess Care After An abscess (also called a boil or furuncle) is an infected area that contains a collection of pus. Signs and symptoms of an abscess include pain, tenderness, redness, or hardness, or you may feel a moveable soft area under your skin. An abscess can occur anywhere in the body. The infection may spread to surrounding tissues causing cellulitis.  The boil may be painful for 5 to 7 days. Most people with a boil do not have high fevers. Your abscess, if seen early, may not have localized, and may not have been lanced. If not, another appointment may be required for this if it does not get better on its own or with medications. HOME CARE INSTRUCTIONS   Only take over-the-counter or prescription medicines for pain, discomfort, or fever as directed by your caregiver.  When you bathe, soak and then remove gauze or iodoform packs at least daily or as directed by your caregiver. You may then wash the wound gently with mild soapy water. Repack with gauze or do as your caregiver directs. SEEK IMMEDIATE MEDICAL CARE IF:   You develop increased pain, swelling, redness, drainage, or bleeding in the wound site.  You develop signs of generalized infection including muscle aches, chills, fever, or a general ill feeling.  An oral temperature above 102 F (38.9 C) develops, not controlled by medication. See your caregiver for a recheck if you develop any of the symptoms described above. If medications (antibiotics) were prescribed, take them as directed. Document Released: 04/28/2005 Document Revised: 01/02/2012 Document Reviewed: 12/24/2007 [facility] Patient Information 2015 [employer] [place]. This information is not intended to replace advice given to you by your health care provider. Make sure you discuss any questions you have with your health care provider.

## 2014-04-24 ENCOUNTER — Emergency Department (HOSPITAL_COMMUNITY)
Admission: EM | Admit: 2014-04-24 | Discharge: 2014-04-24 | Disposition: A | Discharge status: Home / Self Care | Payer: Self-pay | Attending: Emergency Medicine | Admitting: Emergency Medicine

## 2014-04-24 ENCOUNTER — Emergency Department (HOSPITAL_COMMUNITY): Payer: Self-pay

## 2014-04-24 ENCOUNTER — Encounter (HOSPITAL_COMMUNITY): Payer: Self-pay | Admitting: Emergency Medicine

## 2014-04-24 DIAGNOSIS — Z87440 Personal history of urinary (tract) infections: Secondary | ICD-10-CM | POA: Insufficient documentation

## 2014-04-24 DIAGNOSIS — F411 Generalized anxiety disorder: Secondary | ICD-10-CM | POA: Insufficient documentation

## 2014-04-24 DIAGNOSIS — Z3202 Encounter for pregnancy test, result negative: Secondary | ICD-10-CM | POA: Insufficient documentation

## 2014-04-24 DIAGNOSIS — M65849 Other synovitis and tenosynovitis, unspecified hand: Principal | ICD-10-CM

## 2014-04-24 DIAGNOSIS — G8911 Acute pain due to trauma: Secondary | ICD-10-CM | POA: Insufficient documentation

## 2014-04-24 DIAGNOSIS — Z87891 Personal history of nicotine dependence: Secondary | ICD-10-CM | POA: Insufficient documentation

## 2014-04-24 DIAGNOSIS — M65839 Other synovitis and tenosynovitis, unspecified forearm: Secondary | ICD-10-CM | POA: Insufficient documentation

## 2014-04-24 DIAGNOSIS — Z8719 Personal history of other diseases of the digestive system: Secondary | ICD-10-CM | POA: Insufficient documentation

## 2014-04-24 DIAGNOSIS — Z792 Long term (current) use of antibiotics: Secondary | ICD-10-CM | POA: Insufficient documentation

## 2014-04-24 DIAGNOSIS — F319 Bipolar disorder, unspecified: Secondary | ICD-10-CM | POA: Insufficient documentation

## 2014-04-24 DIAGNOSIS — Z8619 Personal history of other infectious and parasitic diseases: Secondary | ICD-10-CM | POA: Insufficient documentation

## 2014-04-24 DIAGNOSIS — IMO0002 Reserved for concepts with insufficient information to code with codable children: Secondary | ICD-10-CM | POA: Insufficient documentation

## 2014-04-24 DIAGNOSIS — Z79899 Other long term (current) drug therapy: Secondary | ICD-10-CM | POA: Insufficient documentation

## 2014-04-24 DIAGNOSIS — M778 Other enthesopathies, not elsewhere classified: Secondary | ICD-10-CM

## 2014-04-24 LAB — POC URINE PREG, ED: Preg Test, Ur: NEGATIVE

## 2014-04-24 MED ORDER — PREDNISONE 50 MG PO TABS: 50.0000 mg | ORAL_TABLET | Freq: Every day | ORAL | Status: DC

## 2014-04-24 MED ORDER — HYDROCODONE-ACETAMINOPHEN 5-325 MG PO TABS: 1.0000 | ORAL_TABLET | Freq: Four times a day (QID) | ORAL | Status: DC | PRN

## 2014-04-24 MED ORDER — IBUPROFEN 800 MG PO TABS: 800.0000 mg | ORAL_TABLET | Freq: Three times a day (TID) | ORAL | Status: DC | PRN

## 2014-04-24 NOTE — ED Provider Notes (Signed)
CSN: [id number]     Arrival date & time 04/24/14  1354 History  This chart was scribed for non-physician practitioner Charlestine Nighthristopher Gabriele Zwilling working with Flint MelterElliott L Wentz, MD by Carl Bestelina Holson, ED Scribe. This patient was seen in room TR10C/TR10C and the patient's care was started at 3:05 PM.     Chief Complaint  Patient presents with  . Wrist Pain   Patient is a 33 y.o. female presenting with wrist pain. The history is provided by the patient. No language interpreter was used.  Wrist Pain   HPI Comments: Natalie Fischer is a 33 y.o. female who presents to the Emergency Department complaining of constant pain located in her right wrist that started two months ago after she fell on it.  She states that her pain is aggravated while at work and she can hear popping sounds when she moves it.  The patient states that her pain is worse at night and when she first wakes up.  She states that she fractured her right wrist when she was a child.   Past Medical History  Diagnosis Date  . GERD (gastroesophageal reflux disease)   . Ectopic pregnancy 2008 and 2014  . Bipolar 1 disorder   . UTI (lower urinary tract infection)   . Trichomonas vaginitis   . Chlamydia   . Anxiety   . Insomnia   . Constipation    Past Surgical History  Procedure Laterality Date  . Ectopic pregnancy surgery     Family History  Problem Relation Age of Onset  . Cancer Sister   . Alcohol abuse Neg Hx   . Arthritis Neg Hx   . Asthma Neg Hx   . Birth defects Neg Hx   . COPD Neg Hx   . Depression Neg Hx   . Diabetes Neg Hx   . Drug abuse Neg Hx   . Early death Neg Hx   . Hearing loss Neg Hx   . Heart disease Neg Hx   . Hyperlipidemia Neg Hx   . Hypertension Neg Hx   . Kidney disease Neg Hx   . Learning disabilities Neg Hx   . Mental illness Neg Hx   . Mental retardation Neg Hx   . Miscarriages / Stillbirths Neg Hx   . Stroke Neg Hx   . Vision loss Neg Hx   . Varicose Veins Neg Hx    History  Substance Use  Topics  . Smoking status: Former Smoker -- 0.25 packs/day    Types: Cigarettes    Quit date: 04/12/2014  . Smokeless tobacco: Never Used  . Alcohol Use: Yes     Comment: social   OB History   Grav Para Term Preterm Abortions TAB SAB Ect Mult Living   2    2   2        Review of Systems  Musculoskeletal: Positive for arthralgias.  All other systems reviewed and are negative.     Allergies  Review of patient's allergies indicates no known allergies.  Home Medications   Prior to Admission medications   Medication Sig Start Date End Date Taking? Authorizing Provider  bacitracin 500 UNIT/GM ointment Apply 1 application topically as needed for wound care.    Historical Provider, MD  carbamazepine (TEGRETOL XR) 400 MG 12 hr tablet Take 400 mg by mouth at bedtime.    Historical Provider, MD  Hydrocortisone (MONISTAT SOOTHING CARE EX) Apply 1 application topically as needed (boil).    Historical Provider, MD  hydrOXYzine (ATARAX/VISTARIL)  25 MG tablet Take 25 mg by mouth 2 (two) times daily.    Historical Provider, MD  ibuprofen (ADVIL,MOTRIN) 200 MG tablet Take 600 mg by mouth every 6 (six) hours as needed for moderate pain.     Historical Provider, MD  neomycin-bacitracin-polymyxin (NEOSPORIN) ointment Apply 1 application topically as needed for wound care. apply to eye    Historical Provider, MD  Prenatal Vit-Fe Fumarate-FA (PRENATAL MULTIVITAMIN) TABS tablet Take 1 tablet by mouth daily at 12 noon.    Historical Provider, MD  sulfamethoxazole-trimethoprim (BACTRIM DS) 800-160 MG per tablet Take 1 tablet by mouth 2 (two) times daily. 04/09/14   Freddi StarrJulie N Ethier, PA-C   Triage Vitals: BP 121/66  Pulse 99  Temp(Src) 98.2 F (36.8 C) (Oral)  Resp 18  Ht 5\' 11"  (1.803 m)  SpO2 99%  LMP 04/01/2014  Physical Exam  Nursing note and vitals reviewed. Constitutional: She is oriented to person, place, and time. She appears well-developed and well-nourished. No distress.  HENT:  Head:  Normocephalic and atraumatic.  Cardiovascular: Normal rate.   Pulmonary/Chest: Effort normal.  Musculoskeletal:       Right wrist: She exhibits tenderness.       Arms: Palpation of the ventral aspect of the right wrist was tender.  Pain with range of motion.  Neurological: She is alert and oriented to person, place, and time.  Skin: Skin is warm and dry. She is not diaphoretic.  Psychiatric: She has a normal mood and affect. Her behavior is normal.    ED Course  Procedures (including critical care time)  DIAGNOSTIC STUDIES: Oxygen Saturation is 99% on room air, nomal by my interpretation.    COORDINATION OF CARE: 3:07 PM- Discussed a clinical suspicion of a tendonitis in the patient's right wrist and discharging the patient with a wrist splint, medication, and a referral to an orthopedic surgeon for a follow-up appointment.  The patient agreed to the treatment plan.  I personally performed the services described in this documentation, which was scribed in my presence. The recorded information has been reviewed and is accurate.   Carlyle Dollyhristopher W Chrystle Murillo, PA-C 04/30/14 [phone]

## 2014-04-24 NOTE — ED Notes (Addendum)
Pt reports right wrist pain x 2 days. Denies known injury but states "I got a little tipsy a couple of days ago so I could have hurt it then." No obvious injury or swelling noted. Pulses intact. Pt also requesting pregnancy test. NAD.

## 2014-04-24 NOTE — Discharge Instructions (Signed)
Return here as needed. Follow up with the orthpedist provided. Ice and heat on the wrist

## 2014-05-04 NOTE — ED Provider Notes (Signed)
Medical screening examination/treatment/procedure(s) were performed by non-physician practitioner and as supervising physician I was immediately available for consultation/collaboration.   EKG Interpretation None       Mariaisabel Bodiford L Leshaun Biebel, MD 05/04/14 1424 

## 2014-07-17 ENCOUNTER — Ambulatory Visit: Payer: Self-pay

## 2014-08-20 ENCOUNTER — Encounter (HOSPITAL_COMMUNITY): Payer: Self-pay | Admitting: Emergency Medicine

## 2014-08-20 ENCOUNTER — Emergency Department (HOSPITAL_COMMUNITY)
Admission: EM | Admit: 2014-08-20 | Discharge: 2014-08-20 | Discharge status: Against Medical Advice | Payer: Self-pay | Attending: Emergency Medicine | Admitting: Emergency Medicine

## 2014-08-20 DIAGNOSIS — M25511 Pain in right shoulder: Secondary | ICD-10-CM | POA: Insufficient documentation

## 2014-08-20 DIAGNOSIS — Z87891 Personal history of nicotine dependence: Secondary | ICD-10-CM | POA: Insufficient documentation

## 2014-08-20 DIAGNOSIS — Z3202 Encounter for pregnancy test, result negative: Secondary | ICD-10-CM | POA: Insufficient documentation

## 2014-08-20 DIAGNOSIS — N898 Other specified noninflammatory disorders of vagina: Secondary | ICD-10-CM | POA: Insufficient documentation

## 2014-08-20 LAB — URINALYSIS, ROUTINE W REFLEX MICROSCOPIC
BILIRUBIN URINE: NEGATIVE
GLUCOSE, UA: NEGATIVE mg/dL
HGB URINE DIPSTICK: NEGATIVE
Ketones, ur: 15 mg/dL — AB
LEUKOCYTES UA: NEGATIVE
Nitrite: NEGATIVE
PROTEIN: NEGATIVE mg/dL
Specific Gravity, Urine: 1.03 (ref 1.005–1.030)
Urobilinogen, UA: 1 mg/dL (ref 0.0–1.0)
pH: 7 (ref 5.0–8.0)

## 2014-08-20 LAB — PREGNANCY, URINE: Preg Test, Ur: NEGATIVE

## 2014-08-20 NOTE — ED Notes (Signed)
Pt reports she ws in [facility] 1 month ago. And had R shoulder pain. Last week she started having L shoulder pain and feeling like it is popping out of joint. Also c/o L wrist discomfort. Pt also would like to be evaluated for vaginal odor. sts this normally happens when she uses new soaps and she stayed with a friend this weekend. No urinary sx.

## 2014-08-20 NOTE — ED Notes (Signed)
Left without being seen.

## 2014-08-25 ENCOUNTER — Encounter (HOSPITAL_COMMUNITY): Payer: Self-pay | Admitting: Emergency Medicine

## 2014-08-28 ENCOUNTER — Encounter (HOSPITAL_COMMUNITY): Payer: Self-pay | Admitting: *Deleted

## 2014-08-28 ENCOUNTER — Inpatient Hospital Stay (HOSPITAL_COMMUNITY)
Admission: AD | Admit: 2014-08-28 | Discharge: 2014-08-28 | Disposition: A | Discharge status: Home / Self Care | Payer: Self-pay | Source: Ambulatory Visit | Attending: Obstetrics and Gynecology | Admitting: Obstetrics and Gynecology

## 2014-08-28 DIAGNOSIS — B9689 Other specified bacterial agents as the cause of diseases classified elsewhere: Secondary | ICD-10-CM | POA: Insufficient documentation

## 2014-08-28 DIAGNOSIS — B373 Candidiasis of vulva and vagina: Secondary | ICD-10-CM | POA: Insufficient documentation

## 2014-08-28 DIAGNOSIS — F17210 Nicotine dependence, cigarettes, uncomplicated: Secondary | ICD-10-CM | POA: Insufficient documentation

## 2014-08-28 DIAGNOSIS — B3731 Acute candidiasis of vulva and vagina: Secondary | ICD-10-CM

## 2014-08-28 DIAGNOSIS — N760 Acute vaginitis: Secondary | ICD-10-CM | POA: Insufficient documentation

## 2014-08-28 DIAGNOSIS — N390 Urinary tract infection, site not specified: Secondary | ICD-10-CM | POA: Insufficient documentation

## 2014-08-28 LAB — CBC
HCT: 38.4 % (ref 36.0–46.0)
HEMOGLOBIN: 12.5 g/dL (ref 12.0–15.0)
MCH: 29.3 pg (ref 26.0–34.0)
MCHC: 32.6 g/dL (ref 30.0–36.0)
MCV: 90.1 fL (ref 78.0–100.0)
PLATELETS: 182 10*3/uL (ref 150–400)
RBC: 4.26 MIL/uL (ref 3.87–5.11)
RDW: 13 % (ref 11.5–15.5)
WBC: 6.1 10*3/uL (ref 4.0–10.5)

## 2014-08-28 LAB — URINALYSIS, ROUTINE W REFLEX MICROSCOPIC
BILIRUBIN URINE: NEGATIVE
GLUCOSE, UA: NEGATIVE mg/dL
Ketones, ur: NEGATIVE mg/dL
Nitrite: NEGATIVE
PH: 5.5 (ref 5.0–8.0)
Protein, ur: NEGATIVE mg/dL
Specific Gravity, Urine: 1.025 (ref 1.005–1.030)
Urobilinogen, UA: 0.2 mg/dL (ref 0.0–1.0)

## 2014-08-28 LAB — WET PREP, GENITAL
TRICH WET PREP: NONE SEEN
Yeast Wet Prep HPF POC: NONE SEEN

## 2014-08-28 LAB — URINE MICROSCOPIC-ADD ON

## 2014-08-28 LAB — POCT PREGNANCY, URINE: Preg Test, Ur: NEGATIVE

## 2014-08-28 MED ORDER — FLUCONAZOLE 150 MG PO TABS: 150.0000 mg | ORAL_TABLET | Freq: Once | ORAL | Status: DC

## 2014-08-28 MED ORDER — PHENAZOPYRIDINE HCL 200 MG PO TABS: 200.0000 mg | ORAL_TABLET | Freq: Three times a day (TID) | ORAL | Status: DC | PRN

## 2014-08-28 MED ORDER — METRONIDAZOLE 500 MG PO TABS: 500.0000 mg | ORAL_TABLET | Freq: Two times a day (BID) | ORAL | Status: DC

## 2014-08-28 MED ORDER — PHENAZOPYRIDINE HCL 100 MG PO TABS
200.0000 mg | ORAL_TABLET | Freq: Once | ORAL | Status: AC
  Administered 2014-08-28: 200 mg via ORAL
  Filled 2014-08-28: qty 2

## 2014-08-28 MED ORDER — CIPROFLOXACIN HCL 500 MG PO TABS
500.0000 mg | ORAL_TABLET | Freq: Once | ORAL | Status: AC
Start: 2014-08-28 — End: 2014-08-28
  Administered 2014-08-28: 500 mg via ORAL
  Filled 2014-08-28: qty 1

## 2014-08-28 MED ORDER — FLUCONAZOLE 150 MG PO TABS
150.0000 mg | ORAL_TABLET | Freq: Once | ORAL | Status: AC
  Administered 2014-08-28: 150 mg via ORAL
  Filled 2014-08-28: qty 1

## 2014-08-28 MED ORDER — IBUPROFEN 600 MG PO TABS
600.0000 mg | ORAL_TABLET | Freq: Once | ORAL | Status: AC
  Administered 2014-08-28: 600 mg via ORAL
  Filled 2014-08-28: qty 1

## 2014-08-28 MED ORDER — CIPROFLOXACIN HCL 500 MG PO TABS: 500.0000 mg | ORAL_TABLET | Freq: Two times a day (BID) | ORAL | Status: DC

## 2014-08-28 NOTE — Discharge Instructions (Signed)
Candidal Vulvovaginitis °Candidal vulvovaginitis is an infection of the vagina and vulva. The vulva is the skin around the opening of the vagina. This may cause itching and discomfort in and around the vagina.  °HOME CARE °· Only take medicine as told by your doctor. °· Do not have sex (intercourse) until the infection is healed or as told by your doctor. °· Practice safe sex. °· Tell your sex partner about your infection. °· Do not douche or use tampons. °· Wear cotton underwear. Do not wear tight pants or panty hose. °· Eat yogurt. This may help treat and prevent yeast infections. °GET HELP RIGHT AWAY IF:  °· You have a fever. °· Your problems get worse during treatment or do not get better in 3 days. °· You have discomfort, irritation, or itching in your vagina or vulva area. °· You have pain after sex. °· You start to get belly (abdominal) pain. °MAKE SURE YOU: °· Understand these instructions. °· Will watch your condition. °· Will get help right away if you are not doing well or get worse. °Document Released: 01/06/2009 Document Revised: 10/15/2013 Document Reviewed: 01/06/2009 °ExitCare® Patient Information ©2015 [employer]. This information is not intended to replace advice given to you by your health care provider. Make sure you discuss any questions you have with your health care provider. ° °Urinary Tract Infection °Urinary tract infections (UTIs) can develop anywhere along your urinary tract. Your urinary tract is your body's drainage system for removing wastes and extra water. Your urinary tract includes two kidneys, two ureters, a bladder, and a urethra. Your kidneys are a pair of bean-shaped organs. Each kidney is about the size of your fist. They are located below your ribs, one on each side of your spine. °CAUSES °Infections are caused by microbes, which are microscopic organisms, including fungi, viruses, and bacteria. These organisms are so small that they can only be seen through a microscope.  Bacteria are the microbes that most commonly cause UTIs. °SYMPTOMS  °Symptoms of UTIs may vary by age and gender of the patient and by the location of the infection. Symptoms in young women typically include a frequent and intense urge to urinate and a painful, burning feeling in the bladder or urethra during urination. Older women and men are more likely to be tired, shaky, and weak and have muscle aches and abdominal pain. A fever may mean the infection is in your kidneys. Other symptoms of a kidney infection include pain in your back or sides below the ribs, nausea, and vomiting. °DIAGNOSIS °To diagnose a UTI, your caregiver will ask you about your symptoms. Your caregiver also will ask to provide a urine sample. The urine sample will be tested for bacteria and white blood cells. White blood cells are made by your body to help fight infection. °TREATMENT  °Typically, UTIs can be treated with medication. Because most UTIs are caused by a bacterial infection, they usually can be treated with the use of antibiotics. The choice of antibiotic and length of treatment depend on your symptoms and the type of bacteria causing your infection. °HOME CARE INSTRUCTIONS °· If you were prescribed antibiotics, take them exactly as your caregiver instructs you. Finish the medication even if you feel better after you have only taken some of the medication. °· Drink enough water and fluids to keep your urine clear or pale yellow. °· Avoid caffeine, tea, and carbonated beverages. They tend to irritate your bladder. °· Empty your bladder often. Avoid holding urine for   long periods of time.  Empty your bladder before and after sexual intercourse.  After a bowel movement, women should cleanse from front to back. Use each tissue only once. SEEK MEDICAL CARE IF:   You have back pain.  You develop a fever.  Your symptoms do not begin to resolve within 3 days. SEEK IMMEDIATE MEDICAL CARE IF:   You have severe back pain or  lower abdominal pain.  You develop chills.  You have nausea or vomiting.  You have continued burning or discomfort with urination. MAKE SURE YOU:   Understand these instructions.  Will watch your condition.  Will get help right away if you are not doing well or get worse. Document Released: 07/20/2005 Document Revised: 04/10/2012 Document Reviewed: 11/18/2011 [facility] Patient Information 2015 [place]Care, [place]. This information is not intended to replace advice given to you by your health care provider. Make sure you discuss any questions you have with your health care provider.  Bacterial Vaginosis Bacterial vaginosis is a vaginal infection that occurs when the normal balance of bacteria in the vagina is disrupted. It results from an overgrowth of certain bacteria. This is the most common vaginal infection in women of childbearing age. Treatment is important to prevent complications, especially in pregnant women, as it can cause a premature delivery. CAUSES  Bacterial vaginosis is caused by an increase in harmful bacteria that are normally present in smaller amounts in the vagina. Several different kinds of bacteria can cause bacterial vaginosis. However, the reason that the condition develops is not fully understood. RISK FACTORS Certain activities or behaviors can put you at an increased risk of developing bacterial vaginosis, including:  Having a new sex partner or multiple sex partners.  Douching.  Using an intrauterine device (IUD) for contraception. Women do not get bacterial vaginosis from toilet seats, bedding, swimming pools, or contact with objects around them. SIGNS AND SYMPTOMS  Some women with bacterial vaginosis have no signs or symptoms. Common symptoms include:  Grey vaginal discharge.  A fishlike odor with discharge, especially after sexual intercourse.  Itching or burning of the vagina and vulva.  Burning or pain with urination. DIAGNOSIS  Your health care  provider will take a medical history and examine the vagina for signs of bacterial vaginosis. A sample of vaginal fluid may be taken. Your health care provider will look at this sample under a microscope to check for bacteria and abnormal cells. A vaginal pH test may also be done.  TREATMENT  Bacterial vaginosis may be treated with antibiotic medicines. These may be given in the form of a pill or a vaginal cream. A second round of antibiotics may be prescribed if the condition comes back after treatment.  HOME CARE INSTRUCTIONS   Only take over-the-counter or prescription medicines as directed by your health care provider.  If antibiotic medicine was prescribed, take it as directed. Make sure you finish it even if you start to feel better.  Do not have sex until treatment is completed.  Tell all sexual partners that you have a vaginal infection. They should see their health care provider and be treated if they have problems, such as a mild rash or itching.  Practice safe sex by using condoms and only having one sex partner. SEEK MEDICAL CARE IF:   Your symptoms are not improving after 3 days of treatment.  You have increased discharge or pain.  You have a fever. MAKE SURE YOU:   Understand these instructions.  Will watch your condition.  Will get help right away if you are not doing well or get worse. FOR MORE INFORMATION  Centers for Disease Control and Prevention, Division of STD Prevention: SolutionApps.co.zawww.cdc.gov/std American Sexual Health Association (ASHA): www.ashastd.org  Document Released: 10/10/2005 Document Revised: 07/31/2013 Document Reviewed: 05/22/2013 [facility]Exit[facility] Patient Information 2015 [place]ExitCare, [place]LL[place]. This information is not intended to replace advice given to you by your health care provider. Make sure you discuss any questions you have with your health care provider.

## 2014-08-28 NOTE — MAU Provider Note (Signed)
Chief Complaint: Dysuria   First Provider Initiated Contact with Patient 08/28/14 1920     SUBJECTIVE HPI: Natalie Fischer is a 33 y.o. [id number] female who presents with low abd cramping, dysuria and urinary frequency x ~ 2 days and increased vaginal w/ discharge w/ odor x a few weeks that seemed to be improving, but got  heavier the past few days. Odor has resolved.   Past Medical History  Diagnosis Date  . GERD (gastroesophageal reflux disease)   . Ectopic pregnancy 2008 and 2014  . Bipolar 1 disorder   . UTI (lower urinary tract infection)   . Trichomonas vaginitis   . Chlamydia   . Anxiety   . Insomnia   . Constipation    OB History  Gravida Para Term Preterm AB SAB TAB Ectopic Multiple Living  2    2   2   0    # Outcome Date GA Lbr Len/2nd Weight Sex Delivery Anes PTL Lv  2 Ectopic 2014             Comments: System Generated. Please review and update pregnancy details.  1 Ectopic 2008             Past Surgical History  Procedure Laterality Date  . Ectopic pregnancy surgery     History   Social History  . Marital Status: Single    Spouse Name: N/A    Number of Children: N/A  . Years of Education: N/A   Occupational History  . Not on file.   Social History Main Topics  . Smoking status: Light Tobacco Smoker -- 0.25 packs/day    Types: Cigarettes    Last Attempt to Quit: 04/12/2014  . Smokeless tobacco: Never Used  . Alcohol Use: Yes     Comment: social  . Drug Use: No  . Sexual Activity: Yes    Birth Control/ Protection: Condom   Other Topics Concern  . Not on file   Social History Narrative   No current facility-administered medications on file prior to encounter.   Current Outpatient Prescriptions on File Prior to Encounter  Medication Sig Dispense Refill  . carbamazepine (TEGRETOL XR) 400 MG 12 hr tablet Take 400 mg by mouth at bedtime.    Marland Kitchen. ibuprofen (ADVIL,MOTRIN) 200 MG tablet Take 800 mg by mouth every 8 (eight) hours as needed for moderate  pain.     . mirtazapine (REMERON) 15 MG tablet Take 15 mg by mouth at bedtime.     No Known Allergies  ROS: Pertinent pos items in HPI. Neg for fever, chills, flank pain, dyspareunia, intermenstrual bleeding.   OBJECTIVE Blood pressure 115/79, pulse 77, temperature 97.3 F (36.3 C), temperature source Oral, resp. rate 18, height 5\' 10"  (1.778 m), weight 59.512 kg (131 lb 3.2 oz), last menstrual period 08/13/2014, SpO2 98 %. GENERAL: Well-developed, well-nourished female in no acute distress.  HEENT: Normocephalic HEART: normal rate RESP: normal effort ABDOMEN: Soft, mild SP tenderness. No CVAT.   EXTREMITIES: Nontender, no edema NEURO: Alert and oriented SPECULUM EXAM: NEFG, mod-large amount of thick, white, curd-like, malodorous discharge, no blood noted, cervix clean BIMANUAL: cervix closed; uterus normal size, no adnexal tenderness or masses. No CMT.   LAB RESULTS Results for orders placed or performed during the hospital encounter of 08/28/14 (from the past 24 hour(s))  Urinalysis, Routine w reflex microscopic     Status: Abnormal   Collection Time: 08/28/14  6:15 PM  Result Value Ref Range   Color, Urine YELLOW YELLOW  APPearance CLEAR CLEAR   Specific Gravity, Urine 1.025 1.005 - 1.030   pH 5.5 5.0 - 8.0   Glucose, UA NEGATIVE NEGATIVE mg/dL   Hgb urine dipstick SMALL (A) NEGATIVE   Bilirubin Urine NEGATIVE NEGATIVE   Ketones, ur NEGATIVE NEGATIVE mg/dL   Protein, ur NEGATIVE NEGATIVE mg/dL   Urobilinogen, UA 0.2 0.0 - 1.0 mg/dL   Nitrite NEGATIVE NEGATIVE   Leukocytes, UA SMALL (A) NEGATIVE  Urine microscopic-add on     Status: Abnormal   Collection Time: 08/28/14  6:15 PM  Result Value Ref Range   Squamous Epithelial / LPF FEW (A) RARE   WBC, UA 11-20 <3 WBC/hpf   RBC / HPF 3-6 <3 RBC/hpf   Bacteria, UA FEW (A) RARE   Urine-Other MUCOUS PRESENT   Pregnancy, urine POC     Status: None   Collection Time: 08/28/14  6:19 PM  Result Value Ref Range   Preg Test, Ur  NEGATIVE NEGATIVE  CBC     Status: None   Collection Time: 08/28/14  6:56 PM  Result Value Ref Range   WBC 6.1 4.0 - 10.5 K/uL   RBC 4.26 3.87 - 5.11 MIL/uL   Hemoglobin 12.5 12.0 - 15.0 g/dL   HCT 47.8 29.5 - 62.1 %   MCV 90.1 78.0 - 100.0 fL   MCH 29.3 26.0 - 34.0 pg   MCHC 32.6 30.0 - 36.0 g/dL   RDW 30.8 65.7 - 84.6 %   Platelets 182 150 - 400 K/uL  Wet prep, genital     Status: Abnormal   Collection Time: 08/28/14  7:27 PM  Result Value Ref Range   Yeast Wet Prep HPF POC NONE SEEN NONE SEEN   Trich, Wet Prep NONE SEEN NONE SEEN   Clue Cells Wet Prep HPF POC MODERATE (A) NONE SEEN   WBC, Wet Prep HPF POC FEW (A) NONE SEEN    IMAGING No results found.  MAU COURSE Pt requesting dose on meds in MAU. Pyridium, Cipro, Diflucan and Ibuprofen given.   ASSESSMENT 1. UTI (lower urinary tract infection)   2. Vaginal yeast infection   3. BV (bacterial vaginosis)     PLAN Discharge home in stable condition.      Follow-up Information    Follow up with Gynecologist .   Why:  As needed if symptoms worsen      Follow up with THE [facility] [place] [facility]ERN[facility] ADMISSIONS.   Why:  As needed in gynecologic emergencies   Contact information:   [address] [phone] mc [place] [address] [phone]       Medication List    TAKE these medications        carbamazepine 400 MG 12 hr tablet  Commonly known as:  TEGRETOL XR  Take 400 mg by mouth at bedtime.     ciprofloxacin 500 MG tablet  Commonly known as:  CIPRO  Take 1 tablet (500 mg total) by mouth 2 (two) times daily.     fluconazole 150 MG tablet  Commonly known as:  DIFLUCAN  Take 1 tablet (150 mg total) by mouth once. If no improvement in yeast infection 3 days after first dose.     ibuprofen 200 MG tablet  Commonly known as:  ADVIL,MOTRIN  Take 800 mg by mouth every 8 (eight) hours as needed for moderate pain.     metroNIDAZOLE 500 MG tablet  Commonly known as:  FLAGYL   Take 1 tablet (500 mg total) by mouth  2 (two) times daily.     mirtazapine 15 MG tablet  Commonly known as:  REMERON  Take 15 mg by mouth at bedtime.     phenazopyridine 200 MG tablet  Commonly known as:  PYRIDIUM  Take 1 tablet (200 mg total) by mouth 3 (three) times daily as needed for pain.         White HeathVirginia Hartner, CNM 08/28/2014  7:59 PM

## 2014-08-28 NOTE — MAU Note (Signed)
Patient states she started having pain with urination on Monday but has been getting worse. Has a slight vaginal discharge with slight odor.

## 2014-08-29 LAB — HIV ANTIBODY (ROUTINE TESTING W REFLEX): HIV 1&2 Ab, 4th Generation: NONREACTIVE

## 2014-08-29 LAB — GC/CHLAMYDIA PROBE AMP
CT Probe RNA: NEGATIVE
GC Probe RNA: NEGATIVE

## 2014-10-07 ENCOUNTER — Encounter (HOSPITAL_COMMUNITY): Payer: Self-pay | Admitting: Physical Medicine and Rehabilitation

## 2014-10-07 ENCOUNTER — Emergency Department (HOSPITAL_COMMUNITY)
Admission: EM | Admit: 2014-10-07 | Discharge: 2014-10-07 | Disposition: A | Discharge status: Home / Self Care | Payer: Self-pay | Attending: Emergency Medicine | Admitting: Emergency Medicine

## 2014-10-07 ENCOUNTER — Emergency Department (HOSPITAL_COMMUNITY): Payer: Self-pay

## 2014-10-07 DIAGNOSIS — M25512 Pain in left shoulder: Secondary | ICD-10-CM | POA: Insufficient documentation

## 2014-10-07 DIAGNOSIS — Z720 Tobacco use: Secondary | ICD-10-CM | POA: Insufficient documentation

## 2014-10-07 DIAGNOSIS — Z87440 Personal history of urinary (tract) infections: Secondary | ICD-10-CM | POA: Insufficient documentation

## 2014-10-07 DIAGNOSIS — Z792 Long term (current) use of antibiotics: Secondary | ICD-10-CM | POA: Insufficient documentation

## 2014-10-07 DIAGNOSIS — Z79899 Other long term (current) drug therapy: Secondary | ICD-10-CM | POA: Insufficient documentation

## 2014-10-07 DIAGNOSIS — F319 Bipolar disorder, unspecified: Secondary | ICD-10-CM | POA: Insufficient documentation

## 2014-10-07 DIAGNOSIS — R52 Pain, unspecified: Secondary | ICD-10-CM

## 2014-10-07 DIAGNOSIS — F419 Anxiety disorder, unspecified: Secondary | ICD-10-CM | POA: Insufficient documentation

## 2014-10-07 DIAGNOSIS — Z8619 Personal history of other infectious and parasitic diseases: Secondary | ICD-10-CM | POA: Insufficient documentation

## 2014-10-07 DIAGNOSIS — K219 Gastro-esophageal reflux disease without esophagitis: Secondary | ICD-10-CM | POA: Insufficient documentation

## 2014-10-07 MED ORDER — TRAMADOL HCL 50 MG PO TABS: 50.0000 mg | ORAL_TABLET | Freq: Four times a day (QID) | ORAL | Status: DC | PRN

## 2014-10-07 MED ORDER — CYCLOBENZAPRINE HCL 10 MG PO TABS: 10.0000 mg | ORAL_TABLET | Freq: Two times a day (BID) | ORAL | Status: DC | PRN

## 2014-10-07 MED ORDER — TRAMADOL HCL 50 MG PO TABS
50.0000 mg | ORAL_TABLET | Freq: Once | ORAL | Status: AC
  Administered 2014-10-07: 50 mg via ORAL
  Filled 2014-10-07: qty 1
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
## 2014-10-07 NOTE — Discharge Instructions (Signed)
Take Tramadol as needed for pain. Take flexeril as needed for muscle spasm. You may take these medications together. Follow up with Dr. Ave Filterhandler for further evaluation.
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
## 2014-10-24 DIAGNOSIS — G5601 Carpal tunnel syndrome, right upper limb: Secondary | ICD-10-CM

## 2014-10-24 HISTORY — DX: Carpal tunnel syndrome, right upper limb: G56.01

## 2015-01-08 ENCOUNTER — Encounter (HOSPITAL_COMMUNITY): Payer: Self-pay | Admitting: *Deleted

## 2015-01-08 ENCOUNTER — Inpatient Hospital Stay (HOSPITAL_COMMUNITY)
Admission: AD | Admit: 2015-01-08 | Discharge: 2015-01-08 | Disposition: A | Discharge status: Home / Self Care | Payer: Self-pay | Source: Ambulatory Visit | Attending: Family Medicine | Admitting: Family Medicine

## 2015-01-08 DIAGNOSIS — F17210 Nicotine dependence, cigarettes, uncomplicated: Secondary | ICD-10-CM | POA: Insufficient documentation

## 2015-01-08 DIAGNOSIS — N8320 Unspecified ovarian cysts: Secondary | ICD-10-CM | POA: Insufficient documentation

## 2015-01-08 DIAGNOSIS — N898 Other specified noninflammatory disorders of vagina: Secondary | ICD-10-CM | POA: Insufficient documentation

## 2015-01-08 DIAGNOSIS — K219 Gastro-esophageal reflux disease without esophagitis: Secondary | ICD-10-CM | POA: Insufficient documentation

## 2015-01-08 DIAGNOSIS — N949 Unspecified condition associated with female genital organs and menstrual cycle: Secondary | ICD-10-CM | POA: Insufficient documentation

## 2015-01-08 DIAGNOSIS — F319 Bipolar disorder, unspecified: Secondary | ICD-10-CM | POA: Insufficient documentation

## 2015-01-08 DIAGNOSIS — T192XXA Foreign body in vulva and vagina, initial encounter: Secondary | ICD-10-CM | POA: Insufficient documentation

## 2015-01-08 DIAGNOSIS — Z202 Contact with and (suspected) exposure to infections with a predominantly sexual mode of transmission: Secondary | ICD-10-CM

## 2015-01-08 LAB — URINALYSIS, ROUTINE W REFLEX MICROSCOPIC
BILIRUBIN URINE: NEGATIVE
Glucose, UA: NEGATIVE mg/dL
HGB URINE DIPSTICK: NEGATIVE
Ketones, ur: 15 mg/dL — AB
Leukocytes, UA: NEGATIVE
Nitrite: NEGATIVE
Protein, ur: NEGATIVE mg/dL
SPECIFIC GRAVITY, URINE: 1.02 (ref 1.005–1.030)
UROBILINOGEN UA: 2 mg/dL — AB (ref 0.0–1.0)
pH: 8.5 — ABNORMAL HIGH (ref 5.0–8.0)

## 2015-01-08 LAB — POCT PREGNANCY, URINE: Preg Test, Ur: NEGATIVE

## 2015-01-08 LAB — WET PREP, GENITAL
Trich, Wet Prep: NONE SEEN
WBC, Wet Prep HPF POC: NONE SEEN
YEAST WET PREP: NONE SEEN

## 2015-01-08 MED ORDER — CEFTRIAXONE SODIUM 250 MG IJ SOLR
250.0000 mg | Freq: Once | INTRAMUSCULAR | Status: AC
  Administered 2015-01-08: 250 mg via INTRAMUSCULAR
  Filled 2015-01-08: qty 250

## 2015-01-08 MED ORDER — TRAMADOL HCL 50 MG PO TABS: 50.0000 mg | ORAL_TABLET | Freq: Four times a day (QID) | ORAL | Status: DC | PRN

## 2015-01-08 MED ORDER — AZITHROMYCIN 250 MG PO TABS
1000.0000 mg | ORAL_TABLET | Freq: Once | ORAL | Status: AC
  Administered 2015-01-08: 1000 mg via ORAL
  Filled 2015-01-08: qty 4

## 2015-01-08 NOTE — MAU Note (Signed)
Urine in lab 

## 2015-01-08 NOTE — MAU Note (Signed)
Pt states she has been having real bad pains in her right side. Pt states pain feel like crampsPt states discharge with odor. Pt states her stomach has not stopped feeling crampy although her last period was December 23, 2014. Pt states pain continues.

## 2015-01-08 NOTE — Progress Notes (Signed)
Tissue pulled from vagina that was left in vagina since end of period

## 2015-01-08 NOTE — MAU Note (Signed)
Clear d/c with an odor, first noted a couple wks ago.  Pain in RLQ, sharp, comes and goes.  Hx of ovarian cysts.  Really uncomfortable. Last wkend had a brownish d/c.

## 2015-01-08 NOTE — MAU Provider Note (Signed)
History     CSN: [id number]  Arrival date and time: 01/08/15 1457   None     Chief Complaint  Patient presents with  . Abdominal Pain  . Vaginal Discharge   HPI  Natalie Fischer is a 34 y.o.BF, G2P0020, with history of Bipolar I Disorder, GERD, right sided ovarian cysts, previous Ectopic pregnancy x2, and previous STIs who is not currently pregnant presents with right sided pelvic pain and vaginal discharge with unpleasant odor. Reports her LMP ended 12/26/13 and she has a habit of wiping the inside of her vagina with toilet paper several times a day after she comes off of her period. She is afraid that there is some residual toilet paper stuck in her vagina. The odor and clear, liquid discharge has been present for 2-3 days. She says "it smells like a dead animal" and smells similar to when she got a tampon stuck in her vagina. She is also concerned for STI because she had unprotected sex 2 weeks ago with a partner she does not trust. She describes her right sided pelvic pain has been present for 1 month and has progressed. Her last period was much heavier than usual. The pain is intermittently sharp, 8/10, and similar to previous pelvic pain from ovarian cysts. She also reports 1 week of left sided chest pain. The pain is intermittently sharp and occurs after eating and when she lies down after eating. Pain last occurred after eating onion rings and drinking soda yesterday.      OB History    Gravida Para Term Preterm AB TAB SAB Ectopic Multiple Living   0      Past Medical History  Diagnosis Date  . GERD (gastroesophageal reflux disease)   . Ectopic pregnancy 2008 and 2014  . Bipolar 1 disorder   . UTI (lower urinary tract infection)   . Trichomonas vaginitis   . Chlamydia   . Anxiety   . Insomnia   . Constipation     Past Surgical History  Procedure Laterality Date  . Ectopic pregnancy surgery      Family History  Problem Relation Age of Onset  . Cancer  Sister   . Alcohol abuse Neg Hx   . Arthritis Neg Hx   . Asthma Neg Hx   . Birth defects Neg Hx   . COPD Neg Hx   . Depression Neg Hx   . Diabetes Neg Hx   . Drug abuse Neg Hx   . Early death Neg Hx   . Hearing loss Neg Hx   . Heart disease Neg Hx   . Hyperlipidemia Neg Hx   . Hypertension Neg Hx   . Kidney disease Neg Hx   . Learning disabilities Neg Hx   . Mental illness Neg Hx   . Mental retardation Neg Hx   . Miscarriages / Stillbirths Neg Hx   . Stroke Neg Hx   . Vision loss Neg Hx   . Varicose Veins Neg Hx     History  Substance Use Topics  . Smoking status: Light Tobacco Smoker -- 0.25 packs/day    Types: Cigarettes    Last Attempt to Quit: 04/12/2014  . Smokeless tobacco: Never Used  . Alcohol Use: Yes     Comment: social    Allergies: No Known Allergies  Prescriptions prior to admission  Medication Sig Dispense Refill Last Dose  . carbamazepine (TEGRETOL XR) 400 MG 12 hr tablet  Take 400 mg by mouth at bedtime.   08/27/2014 at Unknown time  . ciprofloxacin (CIPRO) 500 MG tablet Take 1 tablet (500 mg total) by mouth 2 (two) times daily. 6 tablet 0   . cyclobenzaprine (FLEXERIL) 10 MG tablet Take 1 tablet (10 mg total) by mouth 2 (two) times daily as needed for muscle spasms. 20 tablet 0   . fluconazole (DIFLUCAN) 150 MG tablet Take 1 tablet (150 mg total) by mouth once. If no improvement in yeast infection 3 days after first dose. 1 tablet 0   . ibuprofen (ADVIL,MOTRIN) 200 MG tablet Take 800 mg by mouth every 8 (eight) hours as needed for moderate pain.    08/28/2014 at Unknown time  . metroNIDAZOLE (FLAGYL) 500 MG tablet Take 1 tablet (500 mg total) by mouth 2 (two) times daily. 14 tablet 0   . mirtazapine (REMERON) 15 MG tablet Take 15 mg by mouth at bedtime.   08/27/2014 at Unknown time  . phenazopyridine (PYRIDIUM) 200 MG tablet Take 1 tablet (200 mg total) by mouth 3 (three) times daily as needed for pain. 10 tablet 0   . traMADol (ULTRAM) 50 MG tablet Take 1  tablet (50 mg total) by mouth every 6 (six) hours as needed. 15 tablet 0     Review of Systems  Constitutional: Negative for fever, chills, weight loss, malaise/fatigue and diaphoresis.  HENT: Negative.   Eyes: Negative.   Respiratory: Negative for cough, shortness of breath and wheezing.   Cardiovascular: Positive for chest pain. Negative for palpitations, orthopnea, claudication, leg swelling and PND.  Gastrointestinal: Positive for heartburn. Negative for nausea, vomiting, abdominal pain, diarrhea, constipation and blood in stool.  Genitourinary: Negative for dysuria, urgency, frequency and hematuria.  Musculoskeletal:       Right sided pelvic pain  Skin: Negative for itching and rash.  Neurological: Negative for dizziness, tingling, tremors, focal weakness, seizures, loss of consciousness, weakness and headaches.  Endo/Heme/Allergies: Negative.   Psychiatric/Behavioral: Negative.    Physical Exam   Blood pressure 110/73, pulse 97, temperature 98.1 F (36.7 C), temperature source Oral, resp. rate 16, weight 54.885 kg (121 lb), last menstrual period 12/23/2014.  Physical Exam  Constitutional: She is oriented to person, place, and time. She appears well-developed and well-nourished. No distress.  HENT:  Head: Normocephalic and atraumatic.  Right Ear: External ear normal.  Left Ear: External ear normal.  Eyes: Conjunctivae and EOM are normal. Pupils are equal, round, and reactive to light.  Neck: Normal range of motion. Neck supple.  Cardiovascular: Normal rate, regular rhythm, normal heart sounds and intact distal pulses.  Exam reveals no gallop and no friction rub.   No murmur heard. Respiratory: Effort normal and breath sounds normal. She has no wheezes. She exhibits no tenderness.  No TTP of chest wall  GI: Soft. Bowel sounds are normal. She exhibits no distension and no mass. There is no tenderness. There is no rebound and no guarding. Hernia confirmed negative in the right  inguinal area and confirmed negative in the left inguinal area.  Genitourinary: Uterus normal. There is no rash, tenderness, lesion or injury on the right labia. There is no rash, tenderness, lesion or injury on the left labia. Cervix exhibits discharge. Cervix exhibits no motion tenderness and no friability. Right adnexum displays no mass and no tenderness. Left adnexum displays no mass and no tenderness. No erythema, tenderness or bleeding in the vagina. There is a foreign body in the vagina. Vaginal discharge found.  Multiple FBs of  foul smelling clumps of tissue paper removed from vagina with forceps and large cotton swab. Small amount of white vaginal discharge.  Musculoskeletal: Normal range of motion. She exhibits no edema.  Mild TTP to right side of pelvis  Lymphadenopathy:    She has no cervical adenopathy.       Right: No inguinal adenopathy present.       Left: No inguinal adenopathy present.  Neurological: She is alert and oriented to person, place, and time. No cranial nerve deficit.  Skin: Skin is warm and dry. She is not diaphoretic.  Psychiatric: She has a normal mood and affect. Her behavior is normal. Judgment and thought content normal.    MAU Course  Procedures  MDM  UPT negative UA unremarkable GC/C pending Wet prep ...  Assessment and Plan  A Vaginal Foreign Body Sexual Encounter with partner with STD  P Removed pieces of FB from vagina Counseled patient to refrain from sticking FBs in her vagina Will treat prophylactically with Zithromax and Rocephin   Acie Fredrickson 01/08/2015, 3:39 PM   Patient seen and examined by me also Agree with note Please see my note for official documentation. Aviva Signs, CNM

## 2015-01-08 NOTE — Discharge Instructions (Signed)
Safe Sex Safe sex is about reducing the risk of giving or getting a sexually transmitted disease (STD). STDs are spread through sexual contact involving the genitals, mouth, or rectum. Some STDs can be cured and others cannot. Safe sex can also prevent unintended pregnancies.  WHAT ARE SOME SAFE SEX PRACTICES?  Limit your sexual activity to only one partner who is having sex with only you.  Talk to your partner about his or her past partners, past STDs, and drug use.  Use a condom every time you have sexual intercourse. This includes vaginal, oral, and anal sexual activity. Both females and males should wear condoms during oral sex. Only use latex or polyurethane condoms and water-based lubricants. Using petroleum-based lubricants or oils to lubricate a condom will weaken the condom and increase the chance that it will break. The condom should be in place from the beginning to the end of sexual activity. Wearing a condom reduces, but does not completely eliminate, your risk of getting or giving an STD. STDs can be spread by contact with infected body fluids and skin.  Get vaccinated for hepatitis B and HPV.  Avoid alcohol and recreational drugs, which can affect your judgment. You may forget to use a condom or participate in high-risk sex.  For females, avoid douching after sexual intercourse. Douching can spread an infection farther into the reproductive tract.  Check your body for signs of sores, blisters, rashes, or unusual discharge. See your health care provider if you notice any of these signs.  Avoid sexual contact if you have symptoms of an infection or are being treated for an STD. If you or your partner has herpes, avoid sexual contact when blisters are present. Use condoms at all other times.  If you are at risk of being infected with HIV, it is recommended that you take a prescription medicine daily to prevent HIV infection. This is called pre-exposure prophylaxis (PrEP). You are  considered at risk if:  You are a man who has sex with other men (MSM).  You are a heterosexual man or woman who is sexually active with more than one partner.  You take drugs by injection.  You are sexually active with a partner who has HIV.  Talk with your health care provider about whether you are at high risk of being infected with HIV. If you choose to begin PrEP, you should first be tested for HIV. You should then be tested every 3 months for as long as you are taking PrEP.  See your health care provider for regular screenings, exams, and tests for other STDs. Before having sex with a new partner, each of you should be screened for STDs and should talk about the results with each other. WHAT ARE THE BENEFITS OF SAFE SEX?   There is less chance of getting or giving an STD.  You can prevent unwanted or unintended pregnancies.  By discussing safe sex concerns with your partner, you may increase feelings of intimacy, comfort, trust, and honesty between the two of you. Document Released: 11/17/2004 Document Revised: 02/24/2014 Document Reviewed: 04/02/2012 [facility] Patient Information 2015 [place], [place]. This information is not intended to replace advice given to you by your health care provider. Make sure you discuss any questions you have with your health care provider. Vaginal Foreign Body A vaginal foreign body is any object that gets stuck or left inside the vagina. Vaginal foreign bodies left in the vagina for a long time can cause irritation and infection. In most  cases, symptoms go away once the vaginal foreign body is found and removed. Rarely, a foreign object can break through the walls of the vagina and cause a serious infection inside the abdomen.  CAUSES  The most common vaginal foreign bodies are:  Tampons.  Contraceptive devices.  Toilet tissue left in the vagina.  Small objects that were placed in the vagina out of curiosity and got stuck.  A result of sexual  abuse. SIGNS AND SYMPTOMS  Light vaginal bleeding.  Blood-tinged vaginal fluid (discharge).  Vaginal discharge that smells bad.  Vaginal itching or burning.  Redness, swelling, or rash near the opening of the vagina.  Abdominal pain.  Fever.  Burning or frequent urination. DIAGNOSIS  Your health care provider may be able to diagnose a vaginal foreign body based on the information you provide, your symptoms, and a physical exam. Your health care provider may also perform the following tests to check for infection:  A swab of the discharge to check under a microscope for bacteria (culture).  A urine culture.  An examination of the vagina with a small, lighted scope (vaginoscopy).  Imaging tests to get a picture of the inside of your vagina, such as:  Ultrasound.  X-ray.  MRI. TREATMENT  In most cases, a vaginal foreign body can be easily removed and the symptoms usually go away very quickly. Other treatment may include:   If the vaginal foreign body is not easily removed, medicine may be given to make you go to sleep (general anesthesia) to have the object removed.  Emergency surgery may be necessary if an infection spreads through the walls of the vagina into the abdomen (acute abdomen). This is rare.  You may need to take antibiotic medicine if you have a vaginal or urinary tract infection. HOME CARE INSTRUCTIONS   Take medicines only as directed by your health care provider.  If you were prescribed an antibiotic medicine, finish it all even if you start to feel better.  Do not have sex or use tampons until your health care provider says it is okay.  Do not douche or use vaginal rinses unless your health care provider recommends it.  Keep all follow-up visits as directed by your health care provider. This is important. SEEK MEDICAL CARE IF:  You have abdominal pain or burning pain when urinating.  You have a fever. SEEK IMMEDIATE MEDICAL CARE IF:  You have  heavy vaginal bleeding or discharge.   You have very bad abdominal pain.  MAKE SURE YOU:  Understand these instructions.  Will watch your condition.  Will get help right away if you are not doing well or get worse. Document Released: 02/24/2014 Document Reviewed: 08/09/2013 [facility] Patient Information 2015 [place], [place]. This information is not intended to replace advice given to you by your health care provider. Make sure you discuss any questions you have with your health care provider.

## 2015-01-08 NOTE — Progress Notes (Signed)
Pt 's only complaint is acid reflux that she takes tums for

## 2015-01-08 NOTE — Progress Notes (Signed)
"  Discharge does not  Have an odor I have an odor"

## 2015-01-09 LAB — HIV ANTIBODY (ROUTINE TESTING W REFLEX): HIV Screen 4th Generation wRfx: NONREACTIVE

## 2015-01-09 LAB — GC/CHLAMYDIA PROBE AMP (~~LOC~~) NOT AT ARMC
Chlamydia: NEGATIVE
NEISSERIA GONORRHEA: NEGATIVE

## 2015-01-09 NOTE — MAU Provider Note (Signed)
History     CSN: [id number]  Arrival date and time: 01/08/15 1457   None     Chief Complaint  Patient presents with  . Abdominal Pain  . Vaginal Discharge   HPI  Natalie Fischer is a 34 y.o.BF who is not currently pregnant presents with right sided pelvic pain and vaginal discharge with unpleasant odor. Reports her LMP ended 12/26/13 and she has a habit of wiping the inside of her vagina with toilet paper several times a day after she comes off of her period. She is afraid that there is some residual toilet paper stuck in her vagina. The odor and clear, liquid discharge has been present for 2-3 days.   She says "it smells like a dead animal" and smells similar to when she got a tampon stuck in her vagina. She is also concerned for STI because she had unprotected sex 2 weeks ago with a partner she does not trust.   She describes her right sided pelvic pain has been present for 1 month and has progressed. Her last period was much heavier than usual. The pain is intermittently sharp, 8/10, and similar to previous pelvic pain from ovarian cysts. She also reports 1 week of left sided chest pain. The pain is intermittently sharp and occurs after eating and when she lies down after eating. Pain last occurred after eating onion rings and drinking soda yesterday.      OB History    Gravida Para Term Preterm AB TAB SAB Ectopic Multiple Living   0      Past Medical History  Diagnosis Date  . GERD (gastroesophageal reflux disease)   . Ectopic pregnancy 2008 and 2014  . Bipolar 1 disorder   . UTI (lower urinary tract infection)   . Trichomonas vaginitis   . Chlamydia   . Anxiety   . Insomnia   . Constipation     Past Surgical History  Procedure Laterality Date  . Ectopic pregnancy surgery      Family History  Problem Relation Age of Onset  . Cancer Sister   . Alcohol abuse Neg Hx   . Arthritis Neg Hx   . Asthma Neg Hx   . Birth defects Neg Hx   . COPD Neg Hx    . Depression Neg Hx   . Diabetes Neg Hx   . Drug abuse Neg Hx   . Early death Neg Hx   . Hearing loss Neg Hx   . Heart disease Neg Hx   . Hyperlipidemia Neg Hx   . Hypertension Neg Hx   . Kidney disease Neg Hx   . Learning disabilities Neg Hx   . Mental illness Neg Hx   . Mental retardation Neg Hx   . Miscarriages / Stillbirths Neg Hx   . Stroke Neg Hx   . Vision loss Neg Hx   . Varicose Veins Neg Hx     History  Substance Use Topics  . Smoking status: Light Tobacco Smoker -- 0.25 packs/day    Types: Cigarettes    Last Attempt to Quit: 04/12/2014  . Smokeless tobacco: Never Used  . Alcohol Use: Yes     Comment: social    Allergies: No Known Allergies  Prescriptions prior to admission  Medication Sig Dispense Refill Last Dose  . carbamazepine (TEGRETOL XR) 400 MG 12 hr tablet Take 400 mg by mouth at bedtime.   08/27/2014 at Unknown time  .  ciprofloxacin (CIPRO) 500 MG tablet Take 1 tablet (500 mg total) by mouth 2 (two) times daily. 6 tablet 0   . cyclobenzaprine (FLEXERIL) 10 MG tablet Take 1 tablet (10 mg total) by mouth 2 (two) times daily as needed for muscle spasms. 20 tablet 0   . fluconazole (DIFLUCAN) 150 MG tablet Take 1 tablet (150 mg total) by mouth once. If no improvement in yeast infection 3 days after first dose. 1 tablet 0   . ibuprofen (ADVIL,MOTRIN) 200 MG tablet Take 800 mg by mouth every 8 (eight) hours as needed for moderate pain.    08/28/2014 at Unknown time  . metroNIDAZOLE (FLAGYL) 500 MG tablet Take 1 tablet (500 mg total) by mouth 2 (two) times daily. 14 tablet 0   . mirtazapine (REMERON) 15 MG tablet Take 15 mg by mouth at bedtime.   08/27/2014 at Unknown time  . phenazopyridine (PYRIDIUM) 200 MG tablet Take 1 tablet (200 mg total) by mouth 3 (three) times daily as needed for pain. 10 tablet 0   . traMADol (ULTRAM) 50 MG tablet Take 1 tablet (50 mg total) by mouth every 6 (six) hours as needed. 15 tablet 0     Review of Systems  Constitutional:  Negative for fever, chills  Respiratory: Negative for cough, shortness of breath and wheezing.   Cardiovascular: Positive for chest pain. Negative for palpitations, orthopnea, claudication, leg swelling and PND.  Gastrointestinal: Positive for heartburn. Negative for nausea, vomiting, abdominal pain, diarrhea, constipation and blood in stool.  Genitourinary: Negative for dysuria, urgency, frequency and hematuria.  Musculoskeletal:       Right sided pelvic pain  Skin: Negative for itching and rash.   Psychiatric/Behavioral: Negative.    Physical Exam   Blood pressure 110/73, pulse 97, temperature 98.1 F (36.7 C), temperature source Oral, resp. rate 16, weight 54.885 kg (121 lb), last menstrual period 12/23/2014.  Physical Exam  Constitutional: She is oriented to person, place, and time. She appears well-developed and well-nourished. No distress.  HENT:  Head: Normocephalic and atraumatic.  Cardiovascular: Normal rate, regular rhythm, normal heart sounds and intact distal pulses.  Exam reveals no gallop and no friction rub.   No murmur heard. Respiratory: Effort normal and breath sounds normal. She has no wheezes. She exhibits no tenderness.  No TTP of chest wall  GI: Soft. Bowel sounds are normal. She exhibits no distension and no mass. There is no tenderness. There is no rebound and no guarding.   Genitourinary: Uterus normal. There is no rash, tenderness, lesion or injury on the right labia. There is no rash, tenderness, lesion or injury on the left labia. Cervix exhibits discharge. Cervix exhibits no motion tenderness and no friability. Right adnexum displays no mass and no tenderness. Left adnexum displays no mass and no tenderness. No erythema, tenderness or bleeding in the vagina. There is a foreign body in the vagina. Vaginal discharge found.  Multiple FBs of foul smelling clumps of tissue paper removed from vagina with forceps and large cotton swab. Small amount of white vaginal  discharge.  Mild TTP to right side of pelvis   Musculoskeletal: Normal range of motion. She exhibits no edema.  Lymphadenopathy:       Right: No inguinal adenopathy present.       Left: No inguinal adenopathy present.  Neurological: She is alert and oriented to person, place, and time. Skin: Skin is warm and dry. She is not diaphoretic.  Psychiatric: She has a normal mood and affect.  MAU Course  Procedures  MDM  Results for orders placed or performed during the hospital encounter of 01/08/15 (from the past 72 hour(s))  GC/Chlamydia probe amp ([place])     Status: None   Collection Time: 01/08/15 12:00 AM  Result Value Ref Range   Chlamydia CT: Negative     Comment: Normal Reference Range - Negative   Neisseria gonorrhea NG: Negative     Comment: Normal Reference Range - Negative  Urinalysis, Routine w reflex microscopic     Status: Abnormal   Collection Time: 01/08/15  3:04 PM  Result Value Ref Range   Color, Urine YELLOW YELLOW   APPearance CLEAR CLEAR   Specific Gravity, Urine 1.020 1.005 - 1.030   pH 8.5 (H) 5.0 - 8.0   Glucose, UA NEGATIVE NEGATIVE mg/dL   Hgb urine dipstick NEGATIVE NEGATIVE   Bilirubin Urine NEGATIVE NEGATIVE   Ketones, ur 15 (A) NEGATIVE mg/dL   Protein, ur NEGATIVE NEGATIVE mg/dL   Urobilinogen, UA 2.0 (H) 0.0 - 1.0 mg/dL   Nitrite NEGATIVE NEGATIVE   Leukocytes, UA NEGATIVE NEGATIVE    Comment: MICROSCOPIC NOT DONE ON URINES WITH NEGATIVE PROTEIN, BLOOD, LEUKOCYTES, NITRITE, OR GLUCOSE <1000 mg/dL.  Pregnancy, urine POC     Status: None   Collection Time: 01/08/15  3:13 PM  Result Value Ref Range   Preg Test, Ur NEGATIVE NEGATIVE    Comment:        THE SENSITIVITY OF THIS METHODOLOGY IS >24 mIU/mL   Wet prep, genital     Status: Abnormal   Collection Time: 01/08/15  4:20 PM  Result Value Ref Range   Yeast Wet Prep HPF POC NONE SEEN NONE SEEN   Trich, Wet Prep NONE SEEN NONE SEEN   Clue Cells Wet Prep HPF POC FEW (A) NONE SEEN    WBC, Wet Prep HPF POC NONE SEEN NONE SEEN    Comment: MODERATE BACTERIA SEEN  HIV antibody     Status: None   Collection Time: 01/08/15  4:47 PM  Result Value Ref Range   HIV Screen 4th Generation wRfx Non Reactive Non Reactive    Comment: (NOTE) Performed At: [facility] [address] [place], [place] [id number] Mila HomerHancock William F MD WG:[phone]     Assessment and Plan  A Vaginal Foreign Body Sexual Encounter with partner with possible STD  P Removed pieces of FB from vagina Counseled patient to refrain from sticking FBs in her vagina Will treat prophylactically with Zithromax and Rocephin per patient insistence after consult with Dr Shawnie PonsPratt

## 2015-02-18 ENCOUNTER — Encounter (HOSPITAL_COMMUNITY): Payer: Self-pay | Admitting: Emergency Medicine

## 2015-02-18 ENCOUNTER — Emergency Department (HOSPITAL_COMMUNITY)
Admission: EM | Admit: 2015-02-18 | Discharge: 2015-02-20 | Disposition: A | Discharge status: Other Acute Inpatient Hospital | Payer: Self-pay | Attending: Emergency Medicine | Admitting: Emergency Medicine

## 2015-02-18 DIAGNOSIS — Z79899 Other long term (current) drug therapy: Secondary | ICD-10-CM | POA: Insufficient documentation

## 2015-02-18 DIAGNOSIS — Z720 Tobacco use: Secondary | ICD-10-CM | POA: Insufficient documentation

## 2015-02-18 DIAGNOSIS — Z8669 Personal history of other diseases of the nervous system and sense organs: Secondary | ICD-10-CM | POA: Insufficient documentation

## 2015-02-18 DIAGNOSIS — Z8619 Personal history of other infectious and parasitic diseases: Secondary | ICD-10-CM | POA: Insufficient documentation

## 2015-02-18 DIAGNOSIS — Z8719 Personal history of other diseases of the digestive system: Secondary | ICD-10-CM | POA: Insufficient documentation

## 2015-02-18 DIAGNOSIS — Z87440 Personal history of urinary (tract) infections: Secondary | ICD-10-CM | POA: Insufficient documentation

## 2015-02-18 DIAGNOSIS — Z3202 Encounter for pregnancy test, result negative: Secondary | ICD-10-CM | POA: Insufficient documentation

## 2015-02-18 DIAGNOSIS — F314 Bipolar disorder, current episode depressed, severe, without psychotic features: Secondary | ICD-10-CM | POA: Diagnosis present

## 2015-02-18 DIAGNOSIS — R45851 Suicidal ideations: Secondary | ICD-10-CM | POA: Insufficient documentation

## 2015-02-18 LAB — CBC
HCT: 39.1 % (ref 36.0–46.0)
HEMOGLOBIN: 12.9 g/dL (ref 12.0–15.0)
MCH: 29.5 pg (ref 26.0–34.0)
MCHC: 33 g/dL (ref 30.0–36.0)
MCV: 89.5 fL (ref 78.0–100.0)
Platelets: 198 10*3/uL (ref 150–400)
RBC: 4.37 MIL/uL (ref 3.87–5.11)
RDW: 13.6 % (ref 11.5–15.5)
WBC: 6.2 10*3/uL (ref 4.0–10.5)

## 2015-02-18 LAB — ACETAMINOPHEN LEVEL: Acetaminophen (Tylenol), Serum: <10 ug/mL — ABNORMAL LOW (ref 10–30)

## 2015-02-18 LAB — ETHANOL

## 2015-02-18 LAB — COMPREHENSIVE METABOLIC PANEL
ALT: 15 U/L (ref 0–35)
AST: 23 U/L (ref 0–37)
Albumin: 4.2 g/dL (ref 3.5–5.2)
Alkaline Phosphatase: 66 U/L (ref 39–117)
Anion gap: 7 (ref 5–15)
BUN: 11 mg/dL (ref 6–23)
CO2: 28 mmol/L (ref 19–32)
CREATININE: 1.14 mg/dL — AB (ref 0.50–1.10)
Calcium: 8.8 mg/dL (ref 8.4–10.5)
Chloride: 101 mmol/L (ref 96–112)
GFR calc Af Amer: 72 mL/min — ABNORMAL LOW (ref >90)
GFR calc non Af Amer: 62 mL/min — ABNORMAL LOW (ref >90)
GLUCOSE: 98 mg/dL (ref 70–99)
Potassium: 3.7 mmol/L (ref 3.5–5.1)
Sodium: 136 mmol/L (ref 135–145)
Total Bilirubin: 0.5 mg/dL (ref 0.3–1.2)
Total Protein: 7.8 g/dL (ref 6.0–8.3)

## 2015-02-18 LAB — RAPID URINE DRUG SCREEN, HOSP PERFORMED
Amphetamines: NOT DETECTED
BARBITURATES: NOT DETECTED
Benzodiazepines: NOT DETECTED
Cocaine: POSITIVE — AB
Opiates: NOT DETECTED
Tetrahydrocannabinol: POSITIVE — AB

## 2015-02-18 LAB — URINALYSIS, ROUTINE W REFLEX MICROSCOPIC
BILIRUBIN URINE: NEGATIVE
Glucose, UA: NEGATIVE mg/dL
Ketones, ur: NEGATIVE mg/dL
LEUKOCYTES UA: NEGATIVE
Nitrite: NEGATIVE
PH: 6 (ref 5.0–8.0)
Protein, ur: NEGATIVE mg/dL
SPECIFIC GRAVITY, URINE: 1.013 (ref 1.005–1.030)
UROBILINOGEN UA: 1 mg/dL (ref 0.0–1.0)

## 2015-02-18 LAB — URINE MICROSCOPIC-ADD ON

## 2015-02-18 LAB — PREGNANCY, URINE: PREG TEST UR: NEGATIVE

## 2015-02-18 LAB — SALICYLATE LEVEL: SALICYLATE LVL: 5.7 mg/dL (ref 2.8–20.0)

## 2015-02-18 MED ORDER — ACETAMINOPHEN 325 MG PO TABS: 650.0000 mg | ORAL_TABLET | ORAL | Status: DC | PRN

## 2015-02-18 MED ORDER — ONDANSETRON HCL 4 MG PO TABS: 4.0000 mg | ORAL_TABLET | Freq: Three times a day (TID) | ORAL | Status: DC | PRN

## 2015-02-18 MED ORDER — MIRTAZAPINE 30 MG PO TABS
15.0000 mg | ORAL_TABLET | Freq: Every day | ORAL | Status: DC
  Administered 2015-02-18 – 2015-02-19 (×2): 15 mg via ORAL
  Filled 2015-02-18 (×2): qty 1

## 2015-02-18 MED ORDER — CARBAMAZEPINE ER 400 MG PO TB12
400.0000 mg | ORAL_TABLET | Freq: Every day | ORAL | Status: DC
  Administered 2015-02-18 – 2015-02-19 (×2): 400 mg via ORAL
  Filled 2015-02-18 (×3): qty 1

## 2015-02-18 MED ORDER — IBUPROFEN 200 MG PO TABS: 600.0000 mg | ORAL_TABLET | Freq: Three times a day (TID) | ORAL | Status: DC | PRN

## 2015-02-18 NOTE — ED Provider Notes (Signed)
TIME SEEN: 8:25 PM  CHIEF COMPLAINT: Suicidal thoughts  HPI: Pt is a 34 y.o. female with history of bipolar disorder, substance abuse who presents emergency department with depression for the past 2 years has progressively been worsening with suicidal thoughts for the past 2 months. Denies any attempt. States that she wants to "go to sleep and never wake up". States that she has been stressed recently and upset about her continued cocaine use. States she last used cocaine for the past 4 days. She states that at times she will hear people talking to her that are not present but this is normally after several days and not sleeping. She also feels like someone is following her around. No visual hallucinations. No HI. No current medical complaints other than constipation. She does have some lower abdominal pain intermittently from history of ovarian cyst. She is currently on her menstrual cycle. Denies fevers, chills, vomiting or diarrhea. No dysuria.  ROS: See HPI Constitutional: no fever  Eyes: no drainage  ENT: no runny nose   Cardiovascular:  no chest pain  Resp: no SOB  GI: no vomiting GU: no dysuria Integumentary: no rash  Allergy: no hives  Musculoskeletal: no leg swelling  Neurological: no slurred speech ROS otherwise negative  PAST MEDICAL HISTORY/PAST SURGICAL HISTORY:  Past Medical History  Diagnosis Date  . GERD (gastroesophageal reflux disease)   . Ectopic pregnancy 2008 and 2014  . Bipolar 1 disorder   . UTI (lower urinary tract infection)   . Trichomonas vaginitis   . Chlamydia   . Anxiety   . Insomnia   . Constipation     MEDICATIONS:  Prior to Admission medications   Medication Sig Start Date End Date Taking? Authorizing Provider  b complex vitamins tablet Take 1 tablet by mouth daily.   Yes Historical Provider, MD  carbamazepine (TEGRETOL XR) 400 MG 12 hr tablet Take 400 mg by mouth at bedtime.   Yes Historical Provider, MD  ibuprofen (ADVIL,MOTRIN) 200 MG tablet  Take 800 mg by mouth every 8 (eight) hours as needed for moderate pain.    Yes Historical Provider, MD  mirtazapine (REMERON) 15 MG tablet Take 15 mg by mouth at bedtime.   Yes Historical Provider, MD  traMADol (ULTRAM) 50 MG tablet Take 1 tablet (50 mg total) by mouth every 6 (six) hours as needed. Patient not taking: Reported on 02/18/2015 01/08/15   Aviva SignsMarie L Williams, CNM    ALLERGIES:  No Known Allergies  SOCIAL HISTORY:  History  Substance Use Topics  . Smoking status: Light Tobacco Smoker -- 0.25 packs/day    Types: Cigarettes    Last Attempt to Quit: 04/12/2014  . Smokeless tobacco: Never Used  . Alcohol Use: Yes     Comment: social    FAMILY HISTORY: Family History  Problem Relation Age of Onset  . Cancer Sister   . Alcohol abuse Neg Hx   . Arthritis Neg Hx   . Asthma Neg Hx   . Birth defects Neg Hx   . COPD Neg Hx   . Depression Neg Hx   . Diabetes Neg Hx   . Drug abuse Neg Hx   . Early death Neg Hx   . Hearing loss Neg Hx   . Heart disease Neg Hx   . Hyperlipidemia Neg Hx   . Hypertension Neg Hx   . Kidney disease Neg Hx   . Learning disabilities Neg Hx   . Mental illness Neg Hx   . Mental retardation  Neg Hx   . Miscarriages / Stillbirths Neg Hx   . Stroke Neg Hx   . Vision loss Neg Hx   . Varicose Veins Neg Hx     EXAM: BP 129/94 mmHg  Pulse 86  Temp(Src) 98.1 F (36.7 C) (Oral)  Resp 16  SpO2 100%  LMP 02/16/2015 CONSTITUTIONAL: Alert and oriented and responds appropriately to questions. Well-appearing; well-nourished HEAD: Normocephalic EYES: Conjunctivae clear, PERRL ENT: normal nose; no rhinorrhea; moist mucous membranes; pharynx without lesions noted NECK: Supple, no meningismus, no LAD  CARD: RRR; S1 and S2 appreciated; no murmurs, no clicks, no rubs, no gallops RESP: Normal chest excursion without splinting or tachypnea; breath sounds clear and equal bilaterally; no wheezes, no rhonchi, no rales,  ABD/GI: Normal bowel sounds; non-distended;  soft, non-tender, no rebound, no guarding, no peritoneal signs BACK:  The back appears normal and is non-tender to palpation, there is no CVA tenderness EXT: Normal ROM in all joints; non-tender to palpation; no edema; normal capillary refill; no cyanosis    SKIN: Normal color for age and race; warm NEURO: Moves all extremities equally PSYCH: Patient endorses suicidal thoughts without plan. No HI. We'll have some auditory hallucinations. No visual hallucinations. She is tearful.  MEDICAL DECISION MAKING: Patient here with suicidal thoughts. We'll obtain screening labs and urine. Will consult TTS.  ED PROGRESS: Screening labs unremarkable. Drug screen positive for marijuana and cocaine. Awaiting TTS evaluation for further recommendations. She is here voluntarily.     Layla Maw Lyndsie Wallman, DO 02/18/15 [id number]
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
## 2015-02-18 NOTE — ED Notes (Signed)
Pt reports suicidal ideation for a year. Pt reports called mobile crisis hot line for help. Pt states "trying to overcome drug addition, bipolar, and depression."

## 2015-02-19 DIAGNOSIS — F1220 Cannabis dependence, uncomplicated: Secondary | ICD-10-CM | POA: Insufficient documentation

## 2015-02-19 DIAGNOSIS — F1420 Cocaine dependence, uncomplicated: Secondary | ICD-10-CM | POA: Insufficient documentation

## 2015-02-19 DIAGNOSIS — F314 Bipolar disorder, current episode depressed, severe, without psychotic features: Secondary | ICD-10-CM

## 2015-02-19 DIAGNOSIS — R45851 Suicidal ideations: Secondary | ICD-10-CM

## 2015-02-19 MED ORDER — RISPERIDONE 0.5 MG PO TABS
0.5000 mg | ORAL_TABLET | Freq: Every day | ORAL | Status: DC
  Administered 2015-02-19: 0.5 mg via ORAL
  Filled 2015-02-19: qty 1

## 2015-02-19 NOTE — ED Notes (Signed)
Pt vomited x1.  

## 2015-02-19 NOTE — BHH Counselor (Signed)
Per Donell SievertSpencer Simon, PA, pt meets inpt criteria. Per Delorise Jacksonori, [facility], [facility]. TTS to seek placement and/or [facility] in [facility], pending discharges.   Cyndie MullAnna Deklynn Charlet, [facility] Triage Specialist

## 2015-02-19 NOTE — ED Notes (Signed)
Acuity low. 

## 2015-02-19 NOTE — ED Notes (Signed)
[facility] He received all his personal belongings.  [employer] arrived for transport.  Patient left ambulatory without incident.  Patient refused to put on socks.

## 2015-02-19 NOTE — ED Notes (Signed)
Patient reports SI and AH. Denies HI. Rates anxiety 7/10, depression 10/10. States that sleep and appetite have been poor in recent weeks. Reports weight loss of approximately 10lbs in the last 30 days.   Encouragement offered.  Q15 safety checks continue.

## 2015-02-19 NOTE — BH Assessment (Signed)
[facility] under the care of Dr. Jama Flavorsobos and can arrive after midnight.   Binnie RailJoann Glover, [facility] informed NeurosurgeonMegan RN.   Informed Will, PA about acceptance and he will inform Dr. Anitra LauthPlunkett.   Clista BernhardtNancy Chaska Hagger, [facility] Triage Specialist 02/19/2015 10:34 PM

## 2015-02-19 NOTE — ED Notes (Signed)
Report to [facility]
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
## 2015-02-19 NOTE — Progress Notes (Signed)
Per Chyrl CivatteJoAnn RN [facility]

## 2015-02-19 NOTE — ED Notes (Signed)
Spoke with [facility], they asked about pt moving to psych ED.  Notified them that pt was still sleeping. They are ok if pt moves once she is awake

## 2015-02-19 NOTE — BH Assessment (Addendum)
[facility] Assessment Progress Note  The following facilities have been contacted to seek placement for this pt; all are at capacity at this time:  [facility]  Jadene Stemmer, MA Triage Specialist [phone]     

## 2015-02-20 ENCOUNTER — Inpatient Hospital Stay (HOSPITAL_COMMUNITY)
Admission: AD | Admit: 2015-02-20 | Discharge: 2015-02-27 | DRG: 885 | Disposition: A | Discharge status: Home / Self Care | Payer: Federal, State, Local not specified - Other | Source: Intra-hospital | Attending: Psychiatry | Admitting: Psychiatry

## 2015-02-20 ENCOUNTER — Encounter (HOSPITAL_COMMUNITY): Payer: Self-pay | Admitting: *Deleted

## 2015-02-20 DIAGNOSIS — F1220 Cannabis dependence, uncomplicated: Secondary | ICD-10-CM | POA: Diagnosis present

## 2015-02-20 DIAGNOSIS — R45851 Suicidal ideations: Secondary | ICD-10-CM | POA: Diagnosis present

## 2015-02-20 DIAGNOSIS — F3132 Bipolar disorder, current episode depressed, moderate: Secondary | ICD-10-CM | POA: Diagnosis present

## 2015-02-20 DIAGNOSIS — Z79899 Other long term (current) drug therapy: Secondary | ICD-10-CM | POA: Diagnosis not present

## 2015-02-20 DIAGNOSIS — F329 Major depressive disorder, single episode, unspecified: Secondary | ICD-10-CM | POA: Diagnosis not present

## 2015-02-20 DIAGNOSIS — B9689 Other specified bacterial agents as the cause of diseases classified elsewhere: Secondary | ICD-10-CM | POA: Insufficient documentation

## 2015-02-20 DIAGNOSIS — F29 Unspecified psychosis not due to a substance or known physiological condition: Secondary | ICD-10-CM

## 2015-02-20 DIAGNOSIS — F323 Major depressive disorder, single episode, severe with psychotic features: Secondary | ICD-10-CM

## 2015-02-20 DIAGNOSIS — G4700 Insomnia, unspecified: Secondary | ICD-10-CM | POA: Diagnosis not present

## 2015-02-20 DIAGNOSIS — A5901 Trichomonal vulvovaginitis: Secondary | ICD-10-CM | POA: Diagnosis present

## 2015-02-20 DIAGNOSIS — F1910 Other psychoactive substance abuse, uncomplicated: Secondary | ICD-10-CM | POA: Diagnosis not present

## 2015-02-20 DIAGNOSIS — F1420 Cocaine dependence, uncomplicated: Secondary | ICD-10-CM

## 2015-02-20 DIAGNOSIS — F17210 Nicotine dependence, cigarettes, uncomplicated: Secondary | ICD-10-CM | POA: Diagnosis present

## 2015-02-20 DIAGNOSIS — F319 Bipolar disorder, unspecified: Secondary | ICD-10-CM | POA: Diagnosis present

## 2015-02-20 DIAGNOSIS — N760 Acute vaginitis: Secondary | ICD-10-CM

## 2015-02-20 DIAGNOSIS — F419 Anxiety disorder, unspecified: Secondary | ICD-10-CM | POA: Diagnosis not present

## 2015-02-20 LAB — TSH: TSH: 0.299 u[IU]/mL — ABNORMAL LOW (ref 0.350–4.500)

## 2015-02-20 MED ORDER — HYDROXYZINE HCL 25 MG PO TABS
25.0000 mg | ORAL_TABLET | Freq: Four times a day (QID) | ORAL | Status: DC | PRN
  Administered 2015-02-20 – 2015-02-26 (×8): 25 mg via ORAL
  Filled 2015-02-20 (×2): qty 1
  Filled 2015-02-20: qty 20
  Filled 2015-02-20 (×6): qty 1

## 2015-02-20 MED ORDER — IBUPROFEN 600 MG PO TABS
600.0000 mg | ORAL_TABLET | Freq: Three times a day (TID) | ORAL | Status: DC | PRN
  Administered 2015-02-20: 600 mg via ORAL
  Filled 2015-02-20: qty 1

## 2015-02-20 MED ORDER — ALUM & MAG HYDROXIDE-SIMETH 200-200-20 MG/5ML PO SUSP
30.0000 mL | ORAL | Status: DC | PRN
  Administered 2015-02-23: 30 mL via ORAL
  Filled 2015-02-20: qty 30

## 2015-02-20 MED ORDER — MIRTAZAPINE 30 MG PO TABS
30.0000 mg | ORAL_TABLET | Freq: Every day | ORAL | Status: DC
  Administered 2015-02-20 – 2015-02-26 (×7): 30 mg via ORAL
  Filled 2015-02-20 (×9): qty 1

## 2015-02-20 MED ORDER — CARBAMAZEPINE ER 200 MG PO TB12
400.0000 mg | ORAL_TABLET | Freq: Every day | ORAL | Status: DC
  Administered 2015-02-20 – 2015-02-26 (×7): 400 mg via ORAL
  Filled 2015-02-20 (×9): qty 1

## 2015-02-20 MED ORDER — MAGNESIUM HYDROXIDE 400 MG/5ML PO SUSP
30.0000 mL | Freq: Every day | ORAL | Status: DC | PRN
  Administered 2015-02-21: 30 mL via ORAL
  Filled 2015-02-20: qty 30

## 2015-02-20 MED ORDER — MIRTAZAPINE 15 MG PO TABS
15.0000 mg | ORAL_TABLET | Freq: Every day | ORAL | Status: DC
  Filled 2015-02-20 (×2): qty 1

## 2015-02-20 MED ORDER — ACETAMINOPHEN 325 MG PO TABS: 650.0000 mg | ORAL_TABLET | Freq: Four times a day (QID) | ORAL | Status: DC | PRN

## 2015-02-20 MED ORDER — RISPERIDONE 2 MG PO TABS
2.0000 mg | ORAL_TABLET | Freq: Every day | ORAL | Status: DC
  Administered 2015-02-20 – 2015-02-24 (×5): 2 mg via ORAL
  Filled 2015-02-20 (×8): qty 1

## 2015-02-20 MED ORDER — TRAZODONE HCL 100 MG PO TABS: 100.0000 mg | ORAL_TABLET | Freq: Every evening | ORAL | Status: DC | PRN

## 2015-02-20 NOTE — Plan of Care (Signed)
Problem: Consults Goal: Depression Patient Education See Patient Education Module for education specifics.  Outcome: Completed/Met Date Met:  02/20/15 Nurse discussed depression/coping skills with patient.
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
## 2015-02-20 NOTE — Clinical Social Work Note (Signed)
CSW left voicemail for patient's mother Mitzi HansenJennette Brown [phone], awaiting return call.  Samuella BruinKristin Mirjana Tarleton, MSW, [employer]CSWA Clinical Social Worker [facility] [phone]

## 2015-02-20 NOTE — [facility] Group Notes (Signed)
[facility] LCSW Group Therapy 02/20/2015  1:15 PM   Type of Therapy: Group Therapy  Participation Level: Did Not Attend. Patient invited to participate but declined.    Samuella BruinKristin Merced Brougham, MSW, [employer]CSWA Clinical Social Worker [facility] [phone]

## 2015-02-20 NOTE — Progress Notes (Signed)
[facility] Group Notes:  (Nursing/MHT/Case Management/Adjunct)  Date:  02/20/2015  Time:  8:55 PM  Type of Therapy:  Psychoeducational Skills  Participation Level:  Active  Participation Quality:  Attentive  Affect:  Appropriate  Cognitive:  Appropriate  Insight:  Appropriate  Engagement in Group:  Engaged  Modes of Intervention:  Education  Summary of Progress/Problems: The patient shared with the group that she had a good day since she is feeling better since she is less tired. In addition, she indicated that she is more optimistic. As a theme for the day, her support system will be her mother.   Hazle CocaGOODMAN, Nichols Corter S 02/20/2015, 8:55 PM
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
## 2015-02-20 NOTE — Progress Notes (Signed)
D.  Pt pleasant on approach, denies complaints at this time.  Positive for evening wrap up group, interacting appropriately with peers on the unit.  Denies SI/HI/hallucinations at this time.  A.  Support and encouragement offered  R.  Pt remains safe on the unit, will continue to monitor.  

## 2015-02-20 NOTE — H&P (Signed)
Psychiatric Admission Assessment Adult  Patient Identification: Natalie Fischer MRN:  [id number] Date of Evaluation:  02/20/2015 Chief Complaint:  BIPOLAR DISORDER Principal Diagnosis: Bipolar 1 disorder, depressed, moderate Diagnosis:   Patient Active Problem List   Diagnosis Date Noted  . Bipolar 1 disorder, depressed, moderate [F31.32] 02/20/2015  . Bipolar affective disorder, depressed, severe [F31.4] 02/19/2015  . Suicidal ideations [R45.851]   . Cocaine use disorder, severe, dependence [F14.20]   . Cannabis use disorder, moderate, dependence [F12.20]   . ANEMIA [D64.9] 05/11/2010  . TENDINITIS, LEFT WRIST [M65.88] 05/10/2010  . CANDIDIASIS, VAGINAL [B37.3] 02/04/2009  . LOSS OF WEIGHT [R63.4] 03/07/2008  . ALLERGIC RHINITIS [J30.9] 01/18/2008  . INSOMNIA [G47.00] 01/18/2008  . ANXIETY [F41.1] 08/31/2007  . GERD [K21.9] 06/18/2007  . DYSPEPSIA [K30] 06/18/2007  . CONSTIPATION [K59.00] 06/18/2007  . VAGINITIS, BACTERIAL [N76.0] 06/18/2007  . TRICHOMONAL VAGINITIS [A59.01] 09/28/2006  . CHLAMYDIA TRACHOMATIS, GU SITE NOS [A56.2] 06/17/2005   History of Present Illness: Natalie GullyLizzette L Fischer is a 34 y.o. female patient admitted with Bipolar disorder, depressed with Psychosis, Substance abuse. She was evaluated for increased feelings of depression and anxiety. Patient reports lack of concentration, and racing thoughts. She reports poor sleep and appetite. She has a diagnosis of Bipolar disorder and has been on medications until she was unable to refilL her medicines. She reports seeing shadows behind her and hears people talking but goes out looking with nobody in the house but her. She She also reported using Marijuana seldomly and Cocaine use daily. Patient reports that she is suicidal at this time since her life does not seem to change for the better and plans to OD on her pills. She reports feeling suicidal for one year but stated that she has not done anything in the past  to hurt self. She reports that she is tired of using drugs and will like assistance stopping use. She denies HI/VH. She reports feeling hopeless and helpless. She sees a provider at [facility].   Patient was seen today.  She states that she has been having a hard time concentrating and she stopped attending college.  She hears voices echoing but are not command voices.  This causes her to have poor concentration wherein she can't function.  She has poor appetite and 10 ln weight loss.  Her substance of choice is powder cocaine.  She does not endorse SI/HI.    HPI Elements:  Location: Bipolar disorder, depressed with Psychosis, Cocaine use disorder, severe, Marijuanana use disorder, moderate. Quality: severe, racing thiughts, lack of concentration, anxiety, suicidal feelings, feelings of hopelessness and helplessness . Severity: severe. Timing: Acute. Duration: Chronic mental illness. Context: Seeking treatment for Bipolar disorder and substance use..  Associated Signs/Symptoms: Depression Symptoms:  depressed mood, hopelessness, suicidal attempt, anxiety, (Hypo) Manic Symptoms:  Irritable Mood, Labiality of Mood, Anxiety Symptoms:  Social Anxiety, Psychotic Symptoms:  Hallucinations: Auditory PTSD Symptoms: NA Total Time spent with patient: 30 minutes  Past Medical History:  Past Medical History  Diagnosis Date  . GERD (gastroesophageal reflux disease)   . Ectopic pregnancy 2008 and 2014  . Bipolar 1 disorder   . UTI (lower urinary tract infection)   . Trichomonas vaginitis   . Chlamydia   . Anxiety   . Insomnia   . Constipation     Past Surgical History  Procedure Laterality Date  . Ectopic pregnancy surgery     Family History:  Family History  Problem Relation Age of Onset  . Cancer Sister   .  Alcohol abuse Neg Hx   . Arthritis Neg Hx   . Asthma Neg Hx   . Birth defects Neg Hx   . COPD Neg Hx   . Depression Neg Hx   . Diabetes Neg Hx   . Drug abuse Neg  Hx   . Early death Neg Hx   . Hearing loss Neg Hx   . Heart disease Neg Hx   . Hyperlipidemia Neg Hx   . Hypertension Neg Hx   . Kidney disease Neg Hx   . Learning disabilities Neg Hx   . Mental illness Neg Hx   . Mental retardation Neg Hx   . Miscarriages / Stillbirths Neg Hx   . Stroke Neg Hx   . Vision loss Neg Hx   . Varicose Veins Neg Hx    Social History:  History  Alcohol Use  . Yes    Comment: social     History  Drug Use  . Yes  . Special: Cocaine    History   Social History  . Marital Status: Single    Spouse Name: N/A  . Number of Children: N/A  . Years of Education: N/A   Social History Main Topics  . Smoking status: Light Tobacco Smoker -- 0.25 packs/day    Types: Cigarettes    Last Attempt to Quit: 04/12/2014  . Smokeless tobacco: Never Used  . Alcohol Use: Yes     Comment: social  . Drug Use: Yes    Special: Cocaine  . Sexual Activity: Yes    Birth Control/ Protection: Condom   Other Topics Concern  . None   Social History Narrative   Additional Social History:    Pain Medications: See PTA List; Pt reports occasional recreational use of Rx pain pills Prescriptions: See PTA List Over the Counter: See PTA List History of alcohol / drug use?: Yes Longest period of sobriety (when/how long): Afew weeks Negative Consequences of Use: Personal relationships, Financial Name of Substance 1: Cocaine 1 - Age of First Use: 21 1 - Amount (size/oz): "$100 worth" 1 - Frequency: 3x per week 1 - Duration: 12 years 1 - Last Use / Amount: Yesterday 02/17/15 Name of Substance 2: marijuana 2 - Age of First Use: 16 2 - Amount (size/oz): Unknown 2 - Frequency: Occasionally 2 - Last Use / Amount: "few days ago' Name of Substance 3: opiates ("pain pills") 3 - Age of First Use: 21 3 - Amount (size/oz): Unknown 3 - Frequency: Occasionally "if someone has them around" 3 - Duration: A few years 3 - Last Use / Amount: Unknown   Musculoskeletal: Strength &  Muscle Tone: within normal limits Gait & Station: normal Patient leans: N/A  Psychiatric Specialty Exam: Physical Exam  Vitals reviewed. Psychiatric: Her mood appears anxious. Her affect is labile. She exhibits a depressed mood.    Review of Systems  Psychiatric/Behavioral: Positive for depression. The patient is nervous/anxious.   All other systems reviewed and are negative.   Blood pressure 109/79, pulse 91, temperature 97.8 F (36.6 C), temperature source Oral, resp. rate 16, height 5\' 8"  (1.727 m), weight 52.617 kg (116 lb), last menstrual period 02/16/2015.Body mass index is 17.64 kg/(m^2).  General Appearance: Neat  Eye Contact::  Fair  Speech:  Pressured  Volume:  Normal  Mood:  Anxious and Depressed  Affect:  Depressed  Thought Process:  Coherent and Loose  Orientation:  Full (Time, Place, and Person)  Thought Content:  Hallucinations: Auditory and Rumination  Suicidal Thoughts:  No  Homicidal Thoughts:  No  Memory:  Immediate;   Fair Recent;   Fair Remote;   Fair  Judgement:  Good  Insight:  Fair  Psychomotor Activity:  Normal  Concentration:  Good  Recall:  Good  Fund of Knowledge:Good  Language: Negative  Akathisia:  Negative  Handed:  Right  AIMS (if indicated):     Assets:  Desire for Improvement Housing Social Support  ADL's:  Intact  Cognition: WNL  Sleep:  Number of Hours: 4.25   Risk to Self: Is patient at risk for suicide?: Yes Risk to Others:   Prior Inpatient Therapy:   Prior Outpatient Therapy:    Alcohol Screening: 1. How often do you have a drink containing alcohol?: Monthly or less 2. How many drinks containing alcohol do you have on a typical day when you are drinking?: 1 or 2 3. How often do you have six or more drinks on one occasion?: Never Preliminary Score: 0 4. How often during the last year have you found that you were not able to stop drinking once you had started?: Never 5. How often during the last year have you failed to do  what was normally expected from you becasue of drinking?: Never 6. How often during the last year have you needed a first drink in the morning to get yourself going after a heavy drinking session?: Never 7. How often during the last year have you had a feeling of guilt of remorse after drinking?: Never 8. How often during the last year have you been unable to remember what happened the night before because you had been drinking?: Never 9. Have you or someone else been injured as a result of your drinking?: No 10. Has a relative or friend or a doctor or another health worker been concerned about your drinking or suggested you cut down?: No Alcohol Use Disorder Identification Test Final Score (AUDIT): 1 Brief Intervention: AUDIT score less than 7 or less-screening does not suggest unhealthy drinking-brief intervention not indicated  Allergies:  No Known Allergies Lab Results:  Results for orders placed or performed during the hospital encounter of 02/20/15 (from the past 48 hour(s))  TSH     Status: Abnormal   Collection Time: 02/20/15  6:14 AM  Result Value Ref Range   TSH 0.299 (L) 0.350 - 4.500 uIU/mL    Comment: Performed at [facility]   Current Medications: Current Facility-Administered Medications  Medication Dose Route Frequency Provider Last Rate Last Dose  . acetaminophen (TYLENOL) tablet 650 mg  650 mg Oral Q6H PRN Worthy Flank, NP      . alum & mag hydroxide-simeth (MAALOX/MYLANTA) 200-200-20 MG/5ML suspension 30 mL  30 mL Oral Q4H PRN Worthy Flank, NP      . carbamazepine (TEGRETOL XR) 12 hr tablet 400 mg  400 mg Oral QHS Worthy Flank, NP   400 mg at 02/20/15 0200  . hydrOXYzine (ATARAX/VISTARIL) tablet 25 mg  25 mg Oral Q6H PRN Worthy Flank, NP      . ibuprofen (ADVIL,MOTRIN) tablet 600 mg  600 mg Oral Q8H PRN Worthy Flank, NP      . magnesium hydroxide (MILK OF MAGNESIA) suspension 30 mL  30 mL Oral Daily PRN Worthy Flank, NP      .  mirtazapine (REMERON) tablet 15 mg  15 mg Oral QHS Worthy Flank, NP   15 mg at 02/20/15 0200  . traZODone (DESYREL) tablet 100 mg  100 mg Oral QHS PRN Worthy Flank, NP       PTA Medications: Prescriptions prior to admission  Medication Sig Dispense Refill Last Dose  . b complex vitamins tablet Take 1 tablet by mouth daily.   Unknown  . carbamazepine (TEGRETOL XR) 400 MG 12 hr tablet Take 400 mg by mouth at bedtime.   02/13/2015  . ibuprofen (ADVIL,MOTRIN) 200 MG tablet Take 800 mg by mouth every 8 (eight) hours as needed for moderate pain.    Past Month at Unknown time  . mirtazapine (REMERON) 15 MG tablet Take 15 mg by mouth at bedtime.   02/13/2015    Previous Psychotropic Medications: Yes   Substance Abuse History in the last 12 months:  Yes.    Consequences of Substance Abuse: inpatient treatment  Results for orders placed or performed during the hospital encounter of 02/20/15 (from the past 72 hour(s))  TSH     Status: Abnormal   Collection Time: 02/20/15  6:14 AM  Result Value Ref Range   TSH 0.299 (L) 0.350 - 4.500 uIU/mL    Comment: Performed at [facility]    Observation Level/Precautions:  15 minute checks  Laboratory:  per ED  Psychotherapy:  group  Medications:  As per medlist  Consultations:  As needed  Discharge Concerns:  Safety  Estimated LOS:  2-5 days  Other:     Psychological Evaluations: Yes   Treatment Plan Summary: Admit for crisis management and mood stabilization. Medication management to re-stabilize current mood symptoms Group counseling sessions for coping skills Medical consults as needed Review and reinstate any pertinent home medications for other health problems   Medical Decision Making:  Review of Psycho-Social Stressors (1), Discuss test with performing physician (1), Review and summation of old records (2), Independent Review of image, tracing or specimen (2) and Review of Medication Regimen & Side Effects  (2)  I certify that inpatient services furnished can reasonably be expected to improve the patient's condition.   Velna Hatchet May Agustin AGNP-BC 4/29/20162:04 PM  Patient case reviewed with NP and patient seen by me Agree with NP's Note,Assessment 34 year old female, who presents with symptoms of depression and psychosis. States she has been diagnosed with Bipolar Disorder in the past, but does not currently endorse any clear history of mania, and episodes of increased energy can likely be related to cocaine use. Also has a history of Cocaine Dependence, and recently has been using ( powder) cocaine about twice a week. 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 She describes recent onset of auditory hallucinations ( describes these as " echo like" conversations on the other side of walls) . Does not currently present internally preoccupied and denies command halls. She endorses neuro-vegetative symptoms of depression such as low sense of self esteem , poor energy level, poor appetite and 10 lb weight loss, poor concentration, anhedonia. Has had recent suicidal ideations " because I am tired of feeling this way", and has had some thoughts of overdosing.  She states she has been on Tegretol and Remeron x 1 year , she feels they are well tolerated and feels they do help. She has limited insight into the likelihood cocaine abuse is negatively affecting and contributing to her symptoms. Dx- Bipolar Disorder Depressed versus MDD with psychotic features, versus Substance ( Cocaine) induced Depression/psychosis,  Cocaine Dependence Plan- For now continue REMERON 0- increase to 30 mgrs QHS, TEGRETOL at 400 mgrs QDAY , Add RISPERIDONE at 2 mgrs QHS . Will obtain routine carbamazepine serum level, and also follow up on low TSH by ordering T3, FT4

## 2015-02-20 NOTE — [facility] Group Notes (Signed)
[facility] Note  02/20/2015  8:45 AM  Participation Quality: Did Not Attend. Patient invited to participate but declined.  Samuella BruinKristin Mahalia Dykes, MSW, [employer]CSWA Clinical Social Worker [facility] [phone]

## 2015-02-20 NOTE — [facility] Suicide Risk Assessment (Addendum)
[facility]   Nursing information obtained from:  Patient Demographic factors:  Low socioeconomic status, Unemployed Current Mental Status:  Suicidal ideation indicated by patient Loss Factors:  Legal issues, Financial problems / change in socioeconomic status Historical Factors:  Prior suicide attempts, Family history of mental illness or substance abuse Risk Reduction Factors:  Positive social support Total Time spent with patient: 45 minutes Principal Problem: Bipolar 1 disorder, depressed, moderate Diagnosis:   Patient Active Problem List   Diagnosis Date Noted  . Bipolar 1 disorder, depressed, moderate [F31.32] 02/20/2015  . Bipolar affective disorder, depressed, severe [F31.4] 02/19/2015  . Suicidal ideations [R45.851]   . Cocaine use disorder, severe, dependence [F14.20]   . Cannabis use disorder, moderate, dependence [F12.20]   . ANEMIA [D64.9] 05/11/2010  . TENDINITIS, LEFT WRIST [M65.88] 05/10/2010  . CANDIDIASIS, VAGINAL [B37.3] 02/04/2009  . LOSS OF WEIGHT [R63.4] 03/07/2008  . ALLERGIC RHINITIS [J30.9] 01/18/2008  . INSOMNIA [G47.00] 01/18/2008  . ANXIETY [F41.1] 08/31/2007  . GERD [K21.9] 06/18/2007  . DYSPEPSIA [K30] 06/18/2007  . CONSTIPATION [K59.00] 06/18/2007  . VAGINITIS, BACTERIAL [N76.0] 06/18/2007  . TRICHOMONAL VAGINITIS [A59.01] 09/28/2006  . CHLAMYDIA TRACHOMATIS, GU SITE NOS [A56.2] 06/17/2005     Continued Clinical Symptoms:  Alcohol Use Disorder Identification Test Final Score (AUDIT): 1 The "Alcohol Use Disorders Identification Test", Guidelines for Use in Primary Care, Second Edition.  World Science writerHealth Organization [facility](WHO). Score between 0-7:  no or low risk or alcohol related problems. Score between 8-15:  moderate risk of alcohol related problems. Score between 16-19:  high risk of alcohol related problems. Score 20 or above:  warrants further diagnostic evaluation for alcohol dependence and treatment.   CLINICAL  FACTORS:  34 year old female, who presents with symptoms of depression and psychosis. States she has been diagnosed with Bipolar Disorder in the past, but does not currently endorse any clear history of mania, and episodes of increased energy can likely be related to cocaine use. Also has a history of Cocaine Dependence, and recently has been using ( powder) cocaine about twice a week. 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 She describes recent onset of auditory hallucinations ( describes these as " echo like" conversations on the other side of walls) . Does not currently present internally preoccupied and denies command halls. She endorses neuro-vegetative symptoms of depression such as low sense of self esteem , poor energy level, poor appetite and 10 lb weight loss,  poor concentration, anhedonia. Has had recent suicidal ideations " because I am tired of feeling this way", and has had some thoughts of overdosing.  She states she has been on Tegretol and Remeron x 1 year , she feels they are well tolerated and feels they do help. She has limited insight into the likelihood cocaine abuse is negatively affecting and contributing to her symptoms. Dx- Bipolar Disorder Depressed versus MDD with psychotic features, versus Substance ( Cocaine) induced Depression/psychosis, Cocaine Dependence Plan- For now continue REMERON 0- increase to 30 mgrs QHS, TEGRETOL at  400 mgrs QDAY , Add RISPERIDONE at  2 mgrs QHS . Will obtain routine carbamazepine serum level, and also follow up on low TSH by ordering T3, FT4    Musculoskeletal: Strength & Muscle Tone: within normal limits Gait & Station: normal Patient leans: N/A  Psychiatric Specialty Exam: Physical  Exam  Review of Systems  Constitutional: Positive for weight loss. Negative for fever  and chills.  HENT: Negative.   Eyes: Negative.   Respiratory: Negative.   Cardiovascular: Negative.   Gastrointestinal: Positive for heartburn.  Genitourinary: Negative.   Musculoskeletal: Negative.   Skin: Negative.   Neurological: Negative.   Endo/Heme/Allergies: Negative.   Psychiatric/Behavioral: Positive for depression, suicidal ideas, hallucinations and substance abuse.    Blood pressure 109/79, pulse 91, temperature 97.8 F (36.6 C), temperature source Oral, resp. rate 16, height  (1.727 m), weight 116 lb (52.617 kg), last menstrual period 02/16/2015.Body mass index is 17.64 kg/(m^2).  General Appearance: Fairly Groomed  Patent attorney::  Good  Speech:  Normal Rate  Volume:  Normal  Mood:  Depressed  Affect:  Constricted and Tearful  Thought Process:  Goal Directed and Linear  Orientation:  Other:  fully alert and attentive   Thought Content:  (+) auditory hallucinations as above, no delusions expressed   Suicidal Thoughts:  Yes.  without intent/plan- at this time denies any thoughts of hurting self and  contracts for safety on unit   Homicidal Thoughts:  No  Memory: recent and remote grossly intact  Judgement:  Fair  Insight:  Fair  Psychomotor Activity:  Decreased  Concentration:  Good  Recall:  Good  Fund of Knowledge:Good  Language: Good  Akathisia:  Negative  Handed:  Right  AIMS (if indicated):     Assets:  Desire for Improvement Physical Health Resilience  Sleep:  Number of Hours: 4.25  Cognition: WNL  ADL's:  Impaired     COGNITIVE FEATURES THAT CONTRIBUTE TO RISK:  Closed-mindedness    SUICIDE RISK:   Moderate:  Frequent suicidal ideation with limited intensity, and duration, some specificity in terms of plans, no associated intent, good self-control, limited dysphoria/symptomatology, some risk factors present, and identifiable protective factors, including available and accessible social support.  PLAN OF CARE: Patient will be admitted to  inpatient psychiatric unit for stabilization and safety. Will provide and encourage milieu participation. Provide medication management and maked adjustments as needed.  Will follow daily.  Patient will be admitted to inpatient psychiatric unit for stabilization and safety. Will provide and encourage milieu participation. Provide medication management and maked adjustments as needed.  Will follow daily.    Medical Decision Making:  Review of Psycho-Social Stressors (1), Review or order clinical lab tests (1), Established Problem, Worsening (2), Review of Medication Regimen & Side Effects (2) and Review of New Medication or Change in Dosage (2)  I certify that inpatient services furnished can reasonably be expected to improve the patient's condition.   Mckenzie Bove 02/20/2015, 3:20 PM

## 2015-02-20 NOTE — Tx Team (Signed)
Interdisciplinary Treatment Plan Update (Adult) Date: 02/20/2015   Time Reviewed: 9:30 AM  Progress in Treatment: Attending groups: Continuing to assess, patient new to milieu Participating in groups: Continuing to assess, patient new to milieu Taking medication as prescribed: Yes Tolerating medication: Yes Family/Significant other contact made: Continuing to assess, patient new to milieu Patient understands diagnosis: Yes Discussing patient identified problems/goals with staff: Yes Medical problems stabilized or resolved: Yes Denies suicidal/homicidal ideation: Yes Issues/concerns per patient self-inventory: Yes Other:  New problem(s) identified: N/A  Discharge Plan or Barriers:   4/29: CSW continuing to assess, patient new to milieu.  Reason for Continuation of Hospitalization:  Depression Anxiety Medication Stabilization   Comments: N/A  Estimated length of stay: 3-5 days  For review of initial/current patient goals, please see plan of care.  Patient is a 34 year old female admitted with SI, depression, and substance abuse. Patient currently uses cocaine, K-2, marijuana and prescription medications. Patient [facility] receives [facility] from [facility]. Patient will benefit from crisis stabilization, medication evaluation, group therapy, and psycho education in addition to case management for discharge planning. Patient and CSW reviewed pt's identified goals and treatment plan. Pt verbalized understanding and agreed to treatment plan.     Attendees: Patient:    Family:    Physician: Dr. Jama Flavorsobos; Dr. Dub MikesLugo 02/20/2015 9:30 AM  Nursing:  Robbie LouisVivian Kent, Thereasa Parkinoelle, Beverly CrawfordsvilleKnight, RN 02/20/2015 9:30 AM  Clinical Social Worker: Samuella BruinKristin Flem Enderle, LCSWA 02/20/2015 9:30 AM  Other: Trula SladeHeather Smart, LCSWA 02/20/2015 9:30 AM  Other: Leisa LenzValerie Enoch, [facility] Liaison 02/20/2015 9:30 AM  Other: Onnie BoerJennifer Clark, Case Manager 02/20/2015 9:30 AM  Other: Glory BuffMay Augustin, Shuvon Ranvkin, NP  02/20/2015 9:30 AM  Other: Trula SladeHeather Smart, LCSWA 02/20/2015 9:30 AM  Other:    Other:                02/20/2015 9:30 AM   02/20/2015 9:30 AM   02/20/2015 9:30 AM   02/20/2015 9:30 AM   02/20/2015 9:30 AM   02/20/2015 9:30 AM   02/20/2015 9:30 AM                   Scribe for Treatment Team:  Samuella BruinKristin Martin Belling, MSW, LCSWA [phone]

## 2015-02-20 NOTE — Tx Team (Signed)
Initial Interdisciplinary Treatment Plan   PATIENT STRESSORS: Financial difficulties Legal issue Medication change or noncompliance Occupational concerns Substance abuse   PATIENT STRENGTHS: Ability for insight Communication skills Motivation for treatment/growth Supportive family/friends   PROBLEM LIST: Problem List/Patient Goals Date to be addressed Date deferred Reason deferred Estimated date of resolution  Depression "get meds like they need to be" 02/20/15   At d/c  substance 02/20/15   At d/c  Suicidal ideation 02/20/15   At d/c                                       DISCHARGE CRITERIA:  Improved stabilization in mood, thinking, and/or behavior Motivation to continue treatment in a less acute level of care Need for constant or close observation no longer present Verbal commitment to aftercare and medication compliance  PRELIMINARY DISCHARGE PLAN: Outpatient therapy  PATIENT/FAMIILY INVOLVEMENT: This treatment plan has been presented to and reviewed with the patient, Natalie Fischer, The patient and family have been given the opportunity to ask questions and make suggestions.  Celene KrasRobinson, Natalie Fischer 02/20/2015, 1:23 AM

## 2015-02-20 NOTE — Progress Notes (Signed)
Recreation Therapy Notes   Date: 04.29.2016 Time: 9:30am  Location: [address] Group Room   Group Topic: Stress Management  Goal Area(s) Addresses:  Patient will actively participate in stress management techniques presented during session.   Behavioral Response: Did not attend.   Niamh Rada L Yassmine Tamm, LRT/CTRS  Shayna Eblen L 02/20/2015 1:17 PM 

## 2015-02-21 DIAGNOSIS — R441 Visual hallucinations: Secondary | ICD-10-CM

## 2015-02-21 DIAGNOSIS — F329 Major depressive disorder, single episode, unspecified: Secondary | ICD-10-CM

## 2015-02-21 DIAGNOSIS — F419 Anxiety disorder, unspecified: Secondary | ICD-10-CM

## 2015-02-21 DIAGNOSIS — R440 Auditory hallucinations: Secondary | ICD-10-CM

## 2015-02-21 DIAGNOSIS — F1910 Other psychoactive substance abuse, uncomplicated: Secondary | ICD-10-CM

## 2015-02-21 DIAGNOSIS — G4700 Insomnia, unspecified: Secondary | ICD-10-CM

## 2015-02-21 LAB — LIPID PANEL
CHOL/HDL RATIO: 2.7 ratio
Cholesterol: 138 mg/dL (ref 0–200)
HDL: 51 mg/dL (ref >39)
LDL Cholesterol: 75 mg/dL (ref 0–99)
Triglycerides: 60 mg/dL (ref <150)
VLDL: 12 mg/dL (ref 0–40)

## 2015-02-21 LAB — T4, FREE: Free T4: 1.06 ng/dL (ref 0.80–1.80)

## 2015-02-21 MED ORDER — ENSURE PUDDING PO PUDG
1.0000 | Freq: Three times a day (TID) | ORAL | Status: DC
  Filled 2015-02-21 (×3): qty 1

## 2015-02-21 MED ORDER — ENSURE ENLIVE PO LIQD
237.0000 mL | Freq: Two times a day (BID) | ORAL | Status: DC
  Administered 2015-02-21 – 2015-02-27 (×12): 237 mL via ORAL

## 2015-02-21 NOTE — Progress Notes (Signed)
D.  Pt pleasant on approach, with complaint of constipation since admission.  Positive for evening wrap up group, interacting appropriately with peers on the unit.  Denies SI/HI/hallucinations at this time.  A.  Support and encouragement offered, MOM given for constipation.  R.  Pt remains safe on unit, will continue to monitor.

## 2015-02-21 NOTE — Progress Notes (Signed)
D) Pt continues to hear voices and feels there is a shadow that is on her right shoulder. States that the voice is female. Pt has a small appetite and has been drinking Ensure. Rates her depression, hopelessness and anxiety all at a 5. Denies SI and HI. Has been attending groups and is staying out in the milieu. A) Given support, reassurance and praise. Provided a 1:1.Encouragment given to continue to attend the groups and to do the daily packets and to realize that she cannot use drugs due to her having increased symptoms. R) Denies SI and HI. Trying to keep herself active so she won't hear the voices.

## 2015-02-21 NOTE — Progress Notes (Signed)
[facility] MD Progress Note  02/21/2015 3:35 PM DASHLEY MONTS  MRN:  [id number]  Subjective:  Natalie Fischer states, "Things were going badly for me, still bad right now. I have bad mood swings, hearing things, seeing things, unable to control my mood or thoughts, can't think or focus. I lost 26 pounds in a short amount of time. I don't know what to to do. I know I messed with drugs in the past  but, not any more. I feel like I'm lost"  Principal Problem: <principal problem not specified>  Diagnosis:   Patient Active Problem List   Diagnosis Date Noted  . Bipolar 1 disorder, depressed, moderate [F31.32] 02/20/2015  . 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 Cocaine dependence [F14.20] 02/20/2015  . Bipolar affective disorder, depressed, severe [F31.4] 02/19/2015  . Suicidal ideations [R45.851]   . Cocaine use disorder, severe, dependence [F14.20]   . Cannabis use disorder, moderate, dependence [F12.20]   . ANEMIA [D64.9] 05/11/2010  . TENDINITIS, LEFT WRIST [M65.88] 05/10/2010  . CANDIDIASIS, VAGINAL [B37.3] 02/04/2009  . LOSS OF WEIGHT [R63.4] 03/07/2008  . ALLERGIC RHINITIS [J30.9] 01/18/2008  . INSOMNIA [G47.00] 01/18/2008  . ANXIETY [F41.1] 08/31/2007  . GERD [K21.9] 06/18/2007  . DYSPEPSIA [K30] 06/18/2007  . CONSTIPATION [K59.00] 06/18/2007  . VAGINITIS, BACTERIAL [N76.0] 06/18/2007  . TRICHOMONAL VAGINITIS [A59.01] 09/28/2006  . CHLAMYDIA TRACHOMATIS, GU SITE NOS [A56.2] 06/17/2005   Total Time spent with patient: 35 minutes  Past Medical History:  Past Medical History  Diagnosis Date  . GERD (gastroesophageal reflux disease)   . Ectopic pregnancy 2008 and 2014  . Bipolar 1 disorder   . UTI (lower urinary tract infection)   . Trichomonas vaginitis   . Chlamydia   . Anxiety   . Insomnia   . Constipation     Past Surgical History  Procedure Laterality Date  . Ectopic pregnancy surgery     Family History:  Family History  Problem Relation  Age of Onset  . Cancer Sister   . Alcohol abuse Neg Hx   . Arthritis Neg Hx   . Asthma Neg Hx   . Birth defects Neg Hx   . COPD Neg Hx   . Depression Neg Hx   . Diabetes Neg Hx   . Drug abuse Neg Hx   . Early death Neg Hx   . Hearing loss Neg Hx   . Heart disease Neg Hx   . Hyperlipidemia Neg Hx   . Hypertension Neg Hx   . Kidney disease Neg Hx   . Learning disabilities Neg Hx   . Mental illness Neg Hx   . Mental retardation Neg Hx   . Miscarriages / Stillbirths Neg Hx   . Stroke Neg Hx   . Vision loss Neg Hx   . Varicose Veins Neg Hx    Social History:  History  Alcohol Use  . Yes    Comment: social     History  Drug Use  . Yes  . Special: Cocaine    History   Social History  . Marital Status: Single    Spouse Name: N/A  . Number of Children: N/A  . Years of Education: N/A   Social History Main Topics  . Smoking status: Light Tobacco Smoker -- 0.25 packs/day    Types: Cigarettes    Last Attempt to Quit: 04/12/2014  . Smokeless tobacco: Never Used  . Alcohol Use: Yes     Comment: social  . Drug Use: Yes  Special: Cocaine  . Sexual Activity: Yes    Birth Control/ Protection: Condom   Other Topics Concern  . None   Social History Narrative   Additional History:    Sleep: Good  Appetite:  Fair   Assessment:   Musculoskeletal: Strength & Muscle Tone: within normal limits Gait & Station: normal Patient leans: N/A  Psychiatric Specialty Exam: Physical Exam  Review of Systems  Constitutional: Positive for weight loss and malaise/fatigue.  HENT: Negative.   Eyes: Negative.   Respiratory: Negative.   Cardiovascular: Negative.   Gastrointestinal: Negative.   Genitourinary: Negative.   Musculoskeletal: Negative.   Skin: Negative.   Neurological: Negative.   Endo/Heme/Allergies: Negative.   Psychiatric/Behavioral: Positive for depression, hallucinations and substance abuse. Negative for suicidal ideas and memory loss. The patient is  nervous/anxious and has insomnia.     Blood pressure 113/73, pulse 91, temperature 97.6 F (36.4 C), temperature source Oral, resp. rate 16, height 5\' 8"  (1.727 m), weight 52.617 kg (116 lb), last menstrual period 02/16/2015.Body mass index is 17.64 kg/(m^2).  General Appearance: Emaciated  Eye Contact::  Good  Speech:  Clear and Coherent and Pressured  Volume:  Increased  Mood:  Depressed and Dysphoric  Affect:  Flat and Tearful  Thought Process:  Coherent and Goal Directed  Orientation:  Full (Time, Place, and Person)  Thought Content:  Hallucinations: Auditory Visual and Rumination  Suicidal Thoughts:  No  Homicidal Thoughts:  No  Memory:  Immediate;   Fair Recent;   Fair Remote;   Fair  Judgement:  Good  Insight:  Present  Psychomotor Activity:  Restlessness  Concentration:  Fair  Recall:  [employer]  Fund of Knowledge:Fair  Language: Good  Akathisia:  No  Handed:  Right  AIMS (if indicated):     Assets:  Desire for Improvement  ADL's:  Intact  Cognition: WNL  Sleep:  Number of Hours: 6.75   Current Medications: Current Facility-Administered Medications  Medication Dose Route Frequency Provider Last Rate Last Dose  . acetaminophen (TYLENOL) tablet 650 mg  650 mg Oral Q6H PRN Worthy FlankIjeoma E Nwaeze, NP      . alum & mag hydroxide-simeth (MAALOX/MYLANTA) 200-200-20 MG/5ML suspension 30 mL  30 mL Oral Q4H PRN Worthy FlankIjeoma E Nwaeze, NP      . carbamazepine (TEGRETOL XR) 12 hr tablet 400 mg  400 mg Oral QHS Worthy FlankIjeoma E Nwaeze, NP   400 mg at 02/20/15 2142  . hydrOXYzine (ATARAX/VISTARIL) tablet 25 mg  25 mg Oral Q6H PRN Worthy FlankIjeoma E Nwaeze, NP   25 mg at 02/20/15 1836  . ibuprofen (ADVIL,MOTRIN) tablet 600 mg  600 mg Oral Q8H PRN Worthy FlankIjeoma E Nwaeze, NP   600 mg at 02/20/15 1836  . magnesium hydroxide (MILK OF MAGNESIA) suspension 30 mL  30 mL Oral Daily PRN Worthy FlankIjeoma E Nwaeze, NP      . mirtazapine (REMERON) tablet 30 mg  30 mg Oral QHS Craige CottaFernando A Cobos, MD   30 mg at 02/20/15 2142  . risperiDONE  (RISPERDAL) tablet 2 mg  2 mg Oral QHS Craige CottaFernando A Cobos, MD   2 mg at 02/20/15 2142   Lab Results:  Results for orders placed or performed during the hospital encounter of 02/20/15 (from the past 48 hour(s))  TSH     Status: Abnormal   Collection Time: 02/20/15  6:14 AM  Result Value Ref Range   TSH 0.299 (L) 0.350 - 4.500 uIU/mL    Comment: [facility]  Lipid  panel     Status: None   Collection Time: 02/21/15  6:30 AM  Result Value Ref Range   Cholesterol 138 0 - 200 mg/dL   Triglycerides 60 <846 mg/dL   HDL 51 >96 mg/dL   Total CHOL/HDL Ratio 2.7 RATIO   VLDL 12 0 - 40 mg/dL   LDL Cholesterol 75 0 - 99 mg/dL    Comment:        Total Cholesterol/HDL:CHD Risk Coronary Heart Disease Risk Table                     Men   Women  1/2 Average Risk   3.4   3.3  Average Risk       5.0   4.4  2 X Average Risk   9.6   7.1  3 X Average Risk  23.4   11.0        Use the calculated Patient Ratio above and the CHD Risk Table to determine the patient's CHD Risk.        ATP III CLASSIFICATION (LDL):  <100     mg/dL   Optimal  295-284  mg/dL   Near or Above                    Optimal  130-159  mg/dL   Borderline  132-440  mg/dL   High  >102     mg/dL   Very High Performed at [facility]     Physical Findings: AIMS: Facial and Oral Movements Muscles of Facial Expression: None, normal Lips and Perioral Area: None, normal Jaw: None, normal Tongue: None, normal,Extremity Movements Upper (arms, wrists, hands, fingers): None, normal Lower (legs, knees, ankles, toes): None, normal, Trunk Movements Neck, shoulders, hips: None, normal, Overall Severity Severity of abnormal movements (highest score from questions above): None, normal Incapacitation due to abnormal movements: None, normal Patient's awareness of abnormal movements (rate only patient's report): No Awareness, Dental Status Current problems with teeth and/or dentures?: Yes (rotten  teeth) Does patient usually wear dentures?: No  CIWA:    COWS:  COWS Total Score: 2  Treatment Plan Summary: Daily contact with patient to assess and evaluate symptoms and progress in treatment and Medication management: 1. Continue crisis management, mood stabilization & relapse prevention.. 2. Continue current medication management to reduce current symptoms to base line and improve the  patient's overall level of functioning; Tegretol XR 400 mg for mood stabilization, hydroxyzine 25 mg for anxiety, Risperdal 2 mg for mood control & Mirtazapine 30 mg for depression/insomnia. 3. Treat health problems as indicated; ensure liquid supplement tid. 4. Develop treatment plan to decrease risk of relapse upon discharge and the need for  readmission. 5. Psycho-social education regarding relapse prevention and self care.  Medical Decision Making:  Established Problem, Stable/Improving (1), Review of Psycho-Social Stressors (1), Review of Last Therapy Session (1), Review of Medication Regimen & Side Effects (2) and Review of New Medication or Change in Dosage (2)  Sanjuana Kava, PMHNp-BC 02/21/2015, 3:35 PM  Reviewed the information documented and agree with the treatment plan.  Julie Paolini,JANARDHAHA R. 02/21/2015 4:37 PM

## 2015-02-21 NOTE — Progress Notes (Signed)
[facility] Group Notes:  (Nursing/MHT/Case Management/Adjunct)  Date:  02/21/2015  Time: 2030 Type of Therapy:  wrap up group  Participation Level:  Active  Participation Quality:  Appropriate, Attentive, Sharing and Supportive  Affect:  Appropriate  Cognitive:  Appropriate  Insight:  Improving  Engagement in Group:  Engaged  Modes of Intervention:  Clarification, Education and Support  Summary of Progress/Problems: Pt reports feeling peaceful today and attributes that to the medication. Pt shared that she has been all over the place mentally and wants to be on the "straight and narrow" she describes this as having clear thoughts with the clouds gone away.   Natalie Fischer, [name] Carol 02/21/2015, 9:22 PM

## 2015-02-21 NOTE — [facility] Group Notes (Signed)
[facility] Group Notes:  (Clinical Social Work)  02/21/2015     1:15-2:15PM  Summary of Progress/Problems:   The main focus of today's process group was to learn how to use a decisional balance exercise to move forward in the Stages of Change, which were described and discussed.  Motivational Interviewing and a worksheet were utilized to help patients explore in depth the perceived benefits and costs of unhealthy coping techniques, as well as the  benefits and costs of replacing that with a healthy coping skills.   The patient expressed that their unhealthy coping involves using drugs.  She stated she gets tired of being the strong person in her family, gets angry at them wanting her to always take care of them.  Type of Therapy:  Group Therapy - Process   Participation Level:  Active  Participation Quality:  Attentive and Sharing  Affect:  Blunted  Cognitive:  Appropriate and Oriented  Insight:  Developing/Improving  Engagement in Therapy:  Engaged  Modes of Intervention:  Education, Motivational Interviewing  Ambrose MantleMareida Grossman-Orr, LCSW 02/21/2015, 2:54 PM

## 2015-02-22 DIAGNOSIS — N760 Acute vaginitis: Secondary | ICD-10-CM | POA: Insufficient documentation

## 2015-02-22 DIAGNOSIS — F3132 Bipolar disorder, current episode depressed, moderate: Principal | ICD-10-CM

## 2015-02-22 DIAGNOSIS — A499 Bacterial infection, unspecified: Secondary | ICD-10-CM

## 2015-02-22 DIAGNOSIS — B9689 Other specified bacterial agents as the cause of diseases classified elsewhere: Secondary | ICD-10-CM | POA: Insufficient documentation

## 2015-02-22 MED ORDER — BISACODYL 5 MG PO TBEC
5.0000 mg | DELAYED_RELEASE_TABLET | Freq: Every day | ORAL | Status: DC | PRN
  Administered 2015-02-22 – 2015-02-25 (×2): 5 mg via ORAL
  Filled 2015-02-22 (×2): qty 1

## 2015-02-22 MED ORDER — METRONIDAZOLE 500 MG PO TABS
500.0000 mg | ORAL_TABLET | Freq: Two times a day (BID) | ORAL | Status: DC
  Administered 2015-02-22 – 2015-02-27 (×10): 500 mg via ORAL
  Filled 2015-02-22 (×6): qty 1
  Filled 2015-02-22: qty 2
  Filled 2015-02-22: qty 1
  Filled 2015-02-22: qty 2
  Filled 2015-02-22 (×4): qty 1
  Filled 2015-02-22: qty 2
  Filled 2015-02-22: qty 1

## 2015-02-22 NOTE — Progress Notes (Signed)
D:  Pt presents with flat affect and depressed mood. Pt rates depression 3/10. Hopeless 3/10. Anxiety 0/10. Pt reports having racing thoughts this morning. Pt reports fair sleep. Fair appetite. Pt reports feeling fatigue this morning. Pt denies AVH. Pt has minimal interaction on the unit and forwards little information. Pt compliant with taking meds and attending groups. Pt denies any side effects to her meds.  A: Medications administered as ordered per MD. Verbal support given. Pt encouraged to attend groups. 15 minute checks performed for safety. R: Pt receptive to treatment. Pt stated goal is to block out the voices in her head,

## 2015-02-22 NOTE — Progress Notes (Signed)
[facility]  02/22/2015 12:33 PM Natalie Fischer  MRN:  [id number]  Subjective:  Natalie Fischer states, "my mood is better but still hearing voices strong in my head when I am trying to fall asleep."   Objective:  Natalie Fischer does feel that Natalie Fischer needs to give the meds more time to work.  Natalie Fischer also is satisfied with keeping the time of her Risperdal and Remeron dose at night.  Natalie Fischer is reluctant to change any doses as this may cause her more somnolence.    Principal Problem: Bipolar 1 disorder, depressed, moderate  Diagnosis:   Patient Active Problem List   Diagnosis Date Noted  . Bipolar 1 disorder, depressed, moderate [F31.32] 02/20/2015  . 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 Cocaine dependence [F14.20] 02/20/2015  . Bipolar affective disorder, depressed, severe [F31.4] 02/19/2015  . Suicidal ideations [R45.851]   . Cocaine use disorder, severe, dependence [F14.20]   . Cannabis use disorder, moderate, dependence [F12.20]   . ANEMIA [D64.9] 05/11/2010  . TENDINITIS, LEFT WRIST [M65.88] 05/10/2010  . CANDIDIASIS, VAGINAL [B37.3] 02/04/2009  . LOSS OF WEIGHT [R63.4] 03/07/2008  . ALLERGIC RHINITIS [J30.9] 01/18/2008  . INSOMNIA [G47.00] 01/18/2008  . ANXIETY [F41.1] 08/31/2007  . GERD [K21.9] 06/18/2007  . DYSPEPSIA [K30] 06/18/2007  . CONSTIPATION [K59.00] 06/18/2007  . VAGINITIS, BACTERIAL [N76.0] 06/18/2007  . TRICHOMONAL VAGINITIS [A59.01] 09/28/2006  . CHLAMYDIA TRACHOMATIS, GU SITE NOS [A56.2] 06/17/2005   Total Time spent with patient: 35 minutes  Past Medical History:  Past Medical History  Diagnosis Date  . GERD (gastroesophageal reflux disease)   . Ectopic pregnancy 2008 and 2014  . Bipolar 1 disorder   . UTI (lower urinary tract infection)   . Trichomonas vaginitis   . Chlamydia   . Anxiety   . Insomnia   . Constipation     Past Surgical History  Procedure Laterality Date  . Ectopic pregnancy surgery     Family History:  Family  History  Problem Relation Age of Onset  . Cancer Sister   . Alcohol abuse Neg Hx   . Arthritis Neg Hx   . Asthma Neg Hx   . Birth defects Neg Hx   . COPD Neg Hx   . Depression Neg Hx   . Diabetes Neg Hx   . Drug abuse Neg Hx   . Early death Neg Hx   . Hearing loss Neg Hx   . Heart disease Neg Hx   . Hyperlipidemia Neg Hx   . Hypertension Neg Hx   . Kidney disease Neg Hx   . Learning disabilities Neg Hx   . Mental illness Neg Hx   . Mental retardation Neg Hx   . Miscarriages / Stillbirths Neg Hx   . Stroke Neg Hx   . Vision loss Neg Hx   . Varicose Veins Neg Hx    Social History:  History  Alcohol Use  . Yes    Comment: social     History  Drug Use  . Yes  . Special: Cocaine    History   Social History  . Marital Status: Single    Spouse Name: N/A  . Number of Children: N/A  . Years of Education: N/A   Social History Main Topics  . Smoking status: Light Tobacco Smoker -- 0.25 packs/day    Types: Cigarettes    Last Attempt to Quit: 04/12/2014  . Smokeless tobacco: Never Used  . Alcohol Use: Yes     Comment:  social  . Drug Use: Yes    Special: Cocaine  . Sexual Activity: Yes    Birth Control/ Protection: Condom   Other Topics Concern  . None   Social History Narrative   Additional History:    Sleep: Good  Appetite:  Fair   Assessment:   Musculoskeletal: Strength & Muscle Tone: within normal limits Gait & Station: normal Patient leans: N/A  Psychiatric Specialty Exam: Physical Exam  ROS  Blood pressure 108/77, pulse 91, temperature 98.4 F (36.9 C), temperature source Oral, resp. rate 16, height  (1.727 m), weight 52.617 kg (116 lb), last menstrual period 02/16/2015.Body mass index is 17.64 kg/(m^2).  General Appearance: Emaciated  Eye Contact::  Good  Speech:  Clear and Coherent and Pressured  Volume:  Increased  Mood:  Depressed and Dysphoric  Affect:  Flat and Tearful  Thought Process:  Coherent and Goal Directed  Orientation:   Full (Time, Place, and Person)  Thought Content:  Hallucinations: Auditory Visual and Rumination  Suicidal Thoughts:  No  Homicidal Thoughts:  No  Memory:  Immediate;   Fair Recent;   Fair Remote;   Fair  Judgement:  Good  Insight:  Present  Psychomotor Activity:  Restlessness  Concentration:  Fair  Recall:  [employer] of Knowledge:Fair  Language: Good  Akathisia:  No  Handed:  Right  AIMS (if indicated):     Assets:  Desire for Improvement  ADL's:  Intact  Cognition: WNL  Sleep:  Number of Hours: 6.75   Current Medications: Current Facility-Administered Medications  Medication Dose Route Frequency Provider Last Rate Last Dose  . acetaminophen (TYLENOL) tablet 650 mg  650 mg Oral Q6H PRN Worthy Flank, NP      . alum & mag hydroxide-simeth (MAALOX/MYLANTA) 200-200-20 MG/5ML suspension 30 mL  30 mL Oral Q4H PRN Worthy Flank, NP      . carbamazepine (TEGRETOL XR) 12 hr tablet 400 mg  400 mg Oral QHS Worthy Flank, NP   400 mg at 02/21/15 2128  . feeding supplement (ENSURE ENLIVE) (ENSURE ENLIVE) liquid 237 mL  237 mL Oral BID BM Sanjuana Kava, NP   237 mL at 02/22/15 1159  . hydrOXYzine (ATARAX/VISTARIL) tablet 25 mg  25 mg Oral Q6H PRN Worthy Flank, NP   25 mg at 02/21/15 1812  . ibuprofen (ADVIL,MOTRIN) tablet 600 mg  600 mg Oral Q8H PRN Worthy Flank, NP   600 mg at 02/20/15 1836  . magnesium hydroxide (MILK OF MAGNESIA) suspension 30 mL  30 mL Oral Daily PRN Worthy Flank, NP   30 mL at 02/21/15 2046  . mirtazapine (REMERON) tablet 30 mg  30 mg Oral QHS Craige Cotta, MD   30 mg at 02/21/15 2128  . risperiDONE (RISPERDAL) tablet 2 mg  2 mg Oral QHS Craige Cotta, MD   2 mg at 02/21/15 2128   Lab Results:  Results for orders placed or performed during the hospital encounter of 02/20/15 (from the past 48 hour(s))  Lipid panel     Status: None   Collection Time: 02/21/15  6:30 AM  Result Value Ref Range   Cholesterol 138 0 - 200 mg/dL   Triglycerides 60  <161 mg/dL   HDL 51 >09 mg/dL   Total CHOL/HDL Ratio 2.7 RATIO   VLDL 12 0 - 40 mg/dL   LDL Cholesterol 75 0 - 99 mg/dL    Comment:        Total  Cholesterol/HDL:CHD Risk Coronary Heart Disease Risk Table                     Men   Women  1/2 Average Risk   3.4   3.3  Average Risk       5.0   4.4  2 X Average Risk   9.6   7.1  3 X Average Risk  23.4   11.0        Use the calculated Patient Ratio above and the CHD Risk Table to determine the patient's CHD Risk.        ATP III CLASSIFICATION (LDL):  <100     mg/dL   Optimal  409-811  mg/dL   Near or Above                    Optimal  130-159  mg/dL   Borderline  914-782  mg/dL   High  >956     mg/dL   Very High Performed at [facility]   T4, free     Status: None   Collection Time: 02/21/15  6:30 AM  Result Value Ref Range   Free T4 1.06 0.80 - 1.80 ng/dL    Comment: Performed at [facility]    Physical Findings: AIMS: Facial and Oral Movements Muscles of Facial Expression: None, normal Lips and Perioral Area: None, normal Jaw: None, normal Tongue: None, normal,Extremity Movements Upper (arms, wrists, hands, fingers): None, normal Lower (legs, knees, ankles, toes): None, normal, Trunk Movements Neck, shoulders, hips: None, normal, Overall Severity Severity of abnormal movements (highest score from questions above): None, normal Incapacitation due to abnormal movements: None, normal Patient's awareness of abnormal movements (rate only patient's report): No Awareness, Dental Status Current problems with teeth and/or dentures?: No Does patient usually wear dentures?: No  CIWA:    COWS:  COWS Total Score: 2  Treatment Plan Summary: Daily contact with patient to assess and evaluate symptoms and progress in treatment and Medication management: 1. Continue crisis management, mood stabilization & relapse prevention.. 2. Continue current medication management to reduce current symptoms to base line and improve  the  patient's overall level of functioning; Tegretol XR 400 mg for mood stabilization, hydroxyzine 25 mg for anxiety, Risperdal 2 mg for mood control & Mirtazapine 30 mg for depression/insomnia. 3. Treat health problems as indicated; ensure liquid supplement tid. 4. Develop treatment plan to decrease risk of relapse upon discharge and the need for  readmission. 5. Psycho-social education regarding relapse prevention and self care.  Medical Decision Making:  Established Problem, Stable/Improving (1), Review of Psycho-Social Stressors (1), Review of Last Therapy Session (1), Review of Medication Regimen & Side Effects (2) and Review of New Medication or Change in Dosage (2)  Velna Hatchet May Agustin, AGNP-BC 02/22/2015, 12:33 PM  Reviewed the information documented and agree with the treatment plan.  Daisean Brodhead,JANARDHAHA R. 02/22/2015 2:28 PM

## 2015-02-22 NOTE — [facility] Group Notes (Signed)
[facility] Group Notes:  (Clinical Social Work)  02/22/2015  1:15-2:15PM  Summary of Progress/Problems:   The main focus of today's process group was to   1)  discuss the importance of adding supports  2)  define health supports versus unhealthy supports  3)  identify the patient's current unhealthy supports and plan how to handle them  4)  Identify the patient's current healthy supports and plan what to add.  An emphasis was placed on using counselor, doctor, therapy groups, 12-step groups, and problem-specific support groups to expand supports.    The patient expressed full comprehension of the concepts presented, and agreed that there is a need to add more supports.  The patient stated God was a healthy support and provided insight into his unconditional love as a source of strength. The patient stated her lover was an unhealthy support because he doesn't "try to stop me from doing the behaviors."  Type of Therapy:  Process Group with Motivational Interviewing  Participation Level:  Active  Participation Quality:  Appropriate, Attentive, Sharing and Supportive  Affect:  Appropriate  Cognitive:  Alert, Appropriate and Oriented  Insight:  Engaged  Engagement in Therapy:  Engaged  Modes of Intervention:   Education, Teacher, English as a foreign languageupport and Processing, [employer]  Crystal Patrick-Jefferson, LCSWA 02/22/2015

## 2015-02-22 NOTE — Progress Notes (Signed)
Adult Psychoeducational Group Note  Date:  02/22/2015 Time:  0930  Group Topic/Focus:  Coping With Mental Health Crisis:   The purpose of this group is to help patients identify strategies for coping with mental health crisis.  Group discusses possible causes of crisis and ways to manage them effectively.  Participation Level:  Active  Participation Quality:  Appropriate  Affect:  Appropriate  Cognitive:  Appropriate  Insight: Appropriate  Engagement in Group:  Engaged  Modes of Intervention:  Education  Additional Comments:    Valma Rotenberg L 02/22/2015, 12:33 PM

## 2015-02-22 NOTE — Progress Notes (Signed)
D.  Pt pleasant on approach, denies complaints at this time other than continued constipation.  Positive for evening wrap up group, interacting appropriately with peers on the unit.  Denies SI/HI/hallucinations at this time.  A.  Support and encouragement offered, order for dulcolax received for constipation.  R.  Pt remains safe on the unit, will continue to monitor.

## 2015-02-22 NOTE — Progress Notes (Signed)
Patient ID: Natalie Fischer, female   DOB: 21-Oct-1981, 34 y.o.   MRN: [id number]    Patient had complaints of a watery discharge with a fishy odor for the last two days.  Patient states that has discharge/odor when ever there is a change in the chemical she uses.  States that she has not had her personal items for the last several days.  This happens all the time.  I usually go to the emergency room and they give me the metronidazole stuff for seven days.  Review of Systems  Genitourinary: Negative for dysuria, urgency, frequency, hematuria and flank pain.       Thin ywhite discharge with fishy odor   Plan:  Will start Metronidazole 500 mg bid for 7 days  Natalie B. Rankin FNP-BC   Reviewed the information documented and agree with the treatment plan.  Karnell Vanderloop,JANARDHAHA R. 02/23/2015 8:23 AM

## 2015-02-22 NOTE — Progress Notes (Signed)
[facility] Group Notes:  (Nursing/MHT/Case Management/Adjunct)  Date:  02/22/2015  Time:  2030 Type of Therapy:  wrap up group  Participation Level:  Active  Participation Quality:  Appropriate, Attentive, Sharing and Supportive  Affect:  Appropriate  Cognitive:  Appropriate  Insight:  Improving  Engagement in Group:  Engaged  Modes of Intervention:  Clarification, Education and Support  Summary of Progress/Problems:Pt shared that her mood continued to be good and peaceful.  Pt shared that she had been on a bumpy road but is ready for the straight and narrow. Pt reports needing to make better choices and thinking before she acts. Pt names her mother as her support but God as her number one support.   Natalie Fischer, Natalie Fischer 02/22/2015, 9:37 PM

## 2015-02-23 LAB — CARBAMAZEPINE, FREE AND TOTAL
CARBAMAZEPINE FREE: 2.1 ug/mL (ref 0.6–4.2)
Carbamazepine, Total: 7.1 ug/mL (ref 4.0–12.0)

## 2015-02-23 LAB — HEMOGLOBIN A1C
Hgb A1c MFr Bld: 6 % — ABNORMAL HIGH (ref 4.8–5.6)
Mean Plasma Glucose: 126 mg/dL

## 2015-02-23 LAB — T3: T3, Total: 81 ng/dL (ref 71–180)

## 2015-02-23 NOTE — [facility] Group Notes (Signed)
[facility]   02/23/2015 10:54 AM    Participation Quality:  Appropraite  Mood/Affect:  Appropriate  Depression Rating:  2  Anxiety Rating:  5  Thoughts of Suicide:  No  Will you contract for safety?   NA  Current AVH:  No  Plan for Discharge/Comments:  Patient attended discharge planning group and actively participated in group.  Patient advised of relapsing on cocaine and is requesting referral for residential treatment.  Suicide prevention education reviewed and SPE document provided.   Transportation Means: Patient has transportation.   Supports:  Patient has a support system.   [name], [name]

## 2015-02-23 NOTE — [facility] Group Notes (Signed)
Adult Psychoeducational Group Note  Date:  02/23/2015 Time:  9:08 PM  Group Topic/Focus:  Wrap-Up Group:   The focus of this group is to help patients review their daily goal of treatment and discuss progress on daily workbooks.  Participation Level:  Minimal  Participation Quality:  Appropriate  Affect:  Appropriate  Cognitive:  Appropriate  Insight: Good  Engagement in Group:  Limited  Modes of Intervention:  Discussion  Additional Comments:  Natalie Fischer stated she had a good day.  Her goal was not to get upset and worry if others were talking about her.  She [facility]  Her interventions for wellness and reading the Bible and praying.  Caroll RancherLindsay, Tylena Prisk A 02/23/2015, 9:08 PM

## 2015-02-23 NOTE — [facility] Suicide Risk Assessment (Signed)
[facility] INPATIENT:  Family/Significant Other Suicide Prevention Education  Suicide Prevention Education:  Patient Refusal for Family/Significant Other Suicide Prevention Education: The patient Natalie Fischer has refused to provide written consent for family/significant other to be provided Family/Significant Other Suicide Prevention Education during admission and/or prior to discharge.  Physician notified.  [name], Zorianna Taliaferro Hairston 02/23/2015, 12:53 PM

## 2015-02-23 NOTE — Progress Notes (Signed)
D: Patient presents with appropriate affect and pleasant mood. She reported on the self inventory sheet that sleep, appetite and ability to concentrate are good and energy level is normal. Patient rates depression "5", feelings of hopelessness "3" and anxiety "0". She's attending group sessions, interactive with peers and visible in the milieu. In compliance with medications and tolerating them well.  A: Support and encouragement provided to patient. Scheduled medications given per MD orders. Maintain Q15 minute checks for safety.  R: Patient receptive. Denies SI/HI and AVH. Patient remains safe on the hall.

## 2015-02-23 NOTE — Progress Notes (Signed)
Recreation Therapy Notes  Date: 05.02.2016 Time: 9:30am Location: 300 Hall Group Room   Group Topic: Stress Management  Goal Area(s) Addresses:  Patient will actively participate in stress management techniques presented during session.   Behavioral Response: Did not attend.     Marykay Lexenise L Francina Beery, LRT/CTRS  Nikolas Casher L 02/23/2015 5:02 PM

## 2015-02-23 NOTE — [facility] Group Notes (Signed)
[facility] LCSW Group Therapy          Overcoming Obstacles       1:15 -2:30        02/23/2015   2:22 PM     Type of Therapy:  Group Therapy  Participation Level:  Appropriate  Participation Quality:  Appropriate  Affect:  Appropriate, Alert  Cognitive:  Attentive Appropriate  Insight: Developing/Improving Engaged  Engagement in Therapy: Developing/Imprvoing Engaged  Modes of Intervention:  Discussion Exploration  Education Rapport BuildingProblem-Solving Support  Summary of Progress/Problems:  The main focus of today's group was overcoming obstacles.   Patient advised the obstacle she has to overcome is her family.  She talked about one of the things she and her sibling do is to get high together.  She shared she is close to her family and breaking away from them will be difficult.  Patient able to identify appropriate coping skills including putting her well being first.   [name], Barnaby Rippeon Hairston 02/23/2015   2:22 PM

## 2015-02-23 NOTE — Progress Notes (Addendum)
Patient ID: Natalie Fischer, female   DOB: 12-18-1980, 34 y.o.   MRN: [id number] [facility] MD Progress Note  02/23/2015 6:11 PM SHALAYNE LEACH  MRN:  [id number]  Subjective:  Patient states she is feeling better than upon admission and that her overall mood is better. At this time not endorsing significant side effects from her medication regimen.  Objective:   I have discussed case with treatment team and have met with patient. Patient is improved compared to admission. She is presenting with improved mood, a more reactive affect, less irritability/dysphoria. States medications are working well for her at this time and denies side effects. She has a history of cocaine dependence and today has been more verbal in regards to the negative impact cocaine use has had on her ability to function, how it has affected her socially, and is more aware of the negative impact it has likely had on her mental illness . Psychotic symptoms have subsided and at present does not appear actively psychotic or internally preoccupied . She has expressed interest in being referred to an inpatient/residential rehabilitation setting after discharge, in order to focus on recovery .  Denies medication side effects and feels medications are helping. No disruptive behaviors on unit .   Principal Problem: Bipolar 1 disorder, depressed, moderate  Diagnosis:   Patient Active Problem List   Diagnosis Date Noted  . BV (bacterial vaginosis) [N76.0, A49.9]   . Bipolar 1 disorder, depressed, moderate [F31.32] 02/20/2015  . 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 Cocaine dependence [F14.20] 02/20/2015  . Bipolar affective disorder, depressed, severe [F31.4] 02/19/2015  . Suicidal ideations [R45.851]   . Cocaine use disorder, severe, dependence [F14.20]   . Cannabis use disorder, moderate, dependence [F12.20]   . ANEMIA [D64.9] 05/11/2010  . TENDINITIS, LEFT WRIST [M65.88] 05/10/2010  . CANDIDIASIS,  VAGINAL [B37.3] 02/04/2009  . LOSS OF WEIGHT [R63.4] 03/07/2008  . ALLERGIC RHINITIS [J30.9] 01/18/2008  . INSOMNIA [G47.00] 01/18/2008  . ANXIETY [F41.1] 08/31/2007  . GERD [K21.9] 06/18/2007  . DYSPEPSIA [K30] 06/18/2007  . CONSTIPATION [K59.00] 06/18/2007  . VAGINITIS, BACTERIAL [N76.0] 06/18/2007  . TRICHOMONAL VAGINITIS [A59.01] 09/28/2006  . CHLAMYDIA TRACHOMATIS, GU SITE NOS [A56.2] 06/17/2005   Total Time spent with patient: 25 minutes   Past Medical History:  Past Medical History  Diagnosis Date  . GERD (gastroesophageal reflux disease)   . Ectopic pregnancy 2008 and 2014  . Bipolar 1 disorder   . UTI (lower urinary tract infection)   . Trichomonas vaginitis   . Chlamydia   . Anxiety   . Insomnia   . Constipation     Past Surgical History  Procedure Laterality Date  . Ectopic pregnancy surgery     Family History:  Family History  Problem Relation Age of Onset  . Cancer Sister   . Alcohol abuse Neg Hx   . Arthritis Neg Hx   . Asthma Neg Hx   . Birth defects Neg Hx   . COPD Neg Hx   . Depression Neg Hx   . Diabetes Neg Hx   . Drug abuse Neg Hx   . Early death Neg Hx   . Hearing loss Neg Hx   . Heart disease Neg Hx   . Hyperlipidemia Neg Hx   . Hypertension Neg Hx   . Kidney disease Neg Hx   . Learning disabilities Neg Hx   . Mental illness Neg Hx   . Mental retardation Neg Hx   . Miscarriages /  Stillbirths Neg Hx   . Stroke Neg Hx   . Vision loss Neg Hx   . Varicose Veins Neg Hx    Social History:  History  Alcohol Use  . Yes    Comment: social     History  Drug Use  . Yes  . Special: Cocaine    History   Social History  . Marital Status: Single    Spouse Name: N/A  . Number of Children: N/A  . Years of Education: N/A   Social History Main Topics  . Smoking status: Light Tobacco Smoker -- 0.25 packs/day    Types: Cigarettes    Last Attempt to Quit: 04/12/2014  . Smokeless tobacco: Never Used  . Alcohol Use: Yes     Comment:  social  . Drug Use: Yes    Special: Cocaine  . Sexual Activity: Yes    Birth Control/ Protection: Condom   Other Topics Concern  . None   Social History Narrative   Additional History:    Sleep: Good  Appetite:  Fair   Assessment:   Musculoskeletal: Strength & Muscle Tone: within normal limits Gait & Station: normal Patient leans: N/A  Psychiatric Specialty Exam: Physical Exam  Review of Systems  Constitutional: Negative.   HENT: Negative.   Eyes: Negative.   Respiratory: Negative.   Cardiovascular: Negative.   Gastrointestinal: Negative.   Genitourinary: Negative.   Musculoskeletal: Negative.   Skin: Negative.   Neurological: Negative.   Psychiatric/Behavioral: Positive for depression and substance abuse.    Blood pressure 99/78, pulse 96, temperature 97.9 F (36.6 C), temperature source Oral, resp. rate 16, height 5' 8"  (1.727 m), weight 116 lb (52.617 kg), last menstrual period 02/16/2015.Body mass index is 17.64 kg/(m^2).  General Appearance: improved grooming   Eye Contact::  Good  Speech:  Normal Rate  Volume:  Normal  Mood:  improving, less depressed, less irritable  Affect:   More reactive   Thought Process:  Linear  Orientation:  Full (Time, Place, and Person)  Thought Content:  at this time not internally preoccupied, states hallucinations have subsided, no delusions expressed   Suicidal Thoughts:  No at this time denies any thoughts of hurting self and  contracts for safety on unit   Homicidal Thoughts:  No  Memory:  Immediate;   Fair Recent;   Fair Remote;   Fair  Judgement:  Fair  Insight:  Improving   Psychomotor Activity:  Normal  Concentration:  Fair  Recall:  [employer] of Knowledge:Fair  Language: Good  Akathisia:  No  Handed:  Right  AIMS (if indicated):     Assets:  Desire for Improvement  ADL's: improving   Cognition: WNL  Sleep:  Number of Hours: 6.75   Current Medications: Current Facility-Administered Medications   Medication Dose Route Frequency Provider Last Rate Last Dose  . acetaminophen (TYLENOL) tablet 650 mg  650 mg Oral Q6H PRN Harriet Butte, NP      . alum & mag hydroxide-simeth (MAALOX/MYLANTA) 200-200-20 MG/5ML suspension 30 mL  30 mL Oral Q4H PRN Harriet Butte, NP   30 mL at 02/23/15 1807  . bisacodyl (DULCOLAX) EC tablet 5 mg  5 mg Oral Daily PRN Harriet Butte, NP   5 mg at 02/22/15 2137  . carbamazepine (TEGRETOL XR) 12 hr tablet 400 mg  400 mg Oral QHS Harriet Butte, NP   400 mg at 02/22/15 2136  . feeding supplement (ENSURE ENLIVE) (ENSURE ENLIVE) liquid 237 mL  237 mL Oral BID BM Encarnacion Slates, NP   237 mL at 02/23/15 1607  . hydrOXYzine (ATARAX/VISTARIL) tablet 25 mg  25 mg Oral Q6H PRN Harriet Butte, NP   25 mg at 02/23/15 1806  . ibuprofen (ADVIL,MOTRIN) tablet 600 mg  600 mg Oral Q8H PRN Harriet Butte, NP   600 mg at 02/20/15 1836  . magnesium hydroxide (MILK OF MAGNESIA) suspension 30 mL  30 mL Oral Daily PRN Harriet Butte, NP   30 mL at 02/21/15 2046  . metroNIDAZOLE (FLAGYL) tablet 500 mg  500 mg Oral BID Shuvon B Rankin, NP   500 mg at 02/23/15 1649  . mirtazapine (REMERON) tablet 30 mg  30 mg Oral QHS Jenne Campus, MD   30 mg at 02/22/15 2137  . risperiDONE (RISPERDAL) tablet 2 mg  2 mg Oral QHS Jenne Campus, MD   2 mg at 02/22/15 2137   Lab Results:  No results found for this or any previous visit (from the past 48 hour(s)).  Physical Findings: AIMS: Facial and Oral Movements Muscles of Facial Expression: None, normal Lips and Perioral Area: None, normal Jaw: None, normal Tongue: None, normal,Extremity Movements Upper (arms, wrists, hands, fingers): None, normal Lower (legs, knees, ankles, toes): None, normal, Trunk Movements Neck, shoulders, hips: None, normal, Overall Severity Severity of abnormal movements (highest score from questions above): None, normal Incapacitation due to abnormal movements: None, normal Patient's awareness of abnormal  movements (rate only patient's report): No Awareness, Dental Status Current problems with teeth and/or dentures?: No Does patient usually wear dentures?: No  CIWA:    COWS:  COWS Total Score: 2   Assessment- at this time patient improved compared to admission, and presenting better groomed, better related, with a fuller range of affect, and decrease in reported hallucinations. Also, gaining insight regarding cocaine abuse/dependence and interested in going to an inpatient rehab after discharge. Tolerating medications well at this time.  Treatment Plan Summary: Continue medications as below Tegretol XR 400 mg  QHS for mood stabilization Risperdal 2 mg  QHS for mood control  Mirtazapine 30 mg QHS for  depression/insomnia. CSW working on disposition options.  .  Medical Decision Making:  Established Problem, Stable/Improving (1), Review of Psycho-Social Stressors (1), Review or order clinical lab tests (1) and Review of Medication Regimen & Side Effects (2)  Neita Garnet, MD  02/23/2015, 6:11 PM

## 2015-02-23 NOTE — Clinical Social Work Note (Signed)
[facility] request[facility]

## 2015-02-24 NOTE — Progress Notes (Signed)
D: Pt denies SI/HI/AVH. Pt is pleasant and cooperative. Pt denies AH at this time, but unsure if she was having episodes earlier. Pt has good insight into he Tx. Pt wants to go to LT facility, which will assist her leaving her current situation that would possibly have her back to using.   A: Pt was offered support and encouragement. Pt was given scheduled medications. Pt was encourage to attend groups. Q 15 minute checks were done for safety.   R:Pt attends groups and interacts well with peers and staff. Pt is taking medication. Pt has no complaints at this time .Pt receptive to treatment and safety maintained on unit.

## 2015-02-24 NOTE — Progress Notes (Signed)
Pt stated she felt nervous and was given a visteral 25mg ./

## 2015-02-24 NOTE — [facility] Group Notes (Signed)
Adult Psychoeducational Group Note  Date:  02/24/2015 Time:  8:39 PM  Group Topic/Focus:  Wrap-Up Group:   The focus of this group is to help patients review their daily goal of treatment and discuss progress on daily workbooks.  Participation Level:  Minimal  Participation Quality:  Attentive  Affect:  Appropriate  Cognitive:  Appropriate  Insight: Good  Engagement in Group:  Limited  Modes of Intervention:  Discussion  Additional Comments:  Lilianah said her day was up and down but got better towards the end.  She got a visit from her boyfriend and her goal was to get in touch with treatment center in preparation for her discharge.  She likes to read to relax.  Caroll RancherLindsay, Idy Rawling A 02/24/2015, 8:39 PM

## 2015-02-24 NOTE — Progress Notes (Signed)
Patient ID: Natalie Fischer, female   DOB: 1981-03-18, 34 y.o.   MRN: [id number] [facility] MD Progress Note  02/24/2015 6:50 PM Natalie Fischer  MRN:  [id number]  Subjective:  Patient reports she is better ,but still reports some sense of anxiety and to a lesser degree, of depression. Denies medication side effects.  Objective:   I have discussed case with treatment team and have met with patient. Patient  Continues to present with  Improvement compared to admission.  She has a more reactive affect and is no longer presenting with irritability. She denies medication  side effects. She has continued to process her substance abuse issues , and today spoke at length about her cocaine dependence. Essentially states she has been struggling with cocaine dependence for 15 + years out of which has only been able to remain 6 months. States she needs to make an effort to remove herself from her environment and with " the people I hang out with" because otherwise she is at high risk of relapsing. States she realizes cocaine " makes you feel better in the moment but then makes everything worse and you get really depressed when you stop".  She remains very  Interested in going to an inpatient/residential rehabilitation setting after discharge- as discussed with team, applications have been sent to [facility] and [facility]  No disruptive behaviors on unit . She is going to groups. Today we reviewed medication side effects at length, to include potential for Carbamazepine to be teratogenic, importance of avoiding pregnancy while on tegretol , and other potential serious side effects. Of note, states she has tolerated this medication well and feels it has helped to " level me out some".   Principal Problem: Bipolar 1 disorder, depressed, moderate  Diagnosis:   Patient Active Problem List   Diagnosis Date Noted  . BV (bacterial vaginosis) [N76.0, A49.9]   . Bipolar 1 disorder, depressed, moderate [F31.32] 02/20/2015  .  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 Cocaine dependence [F14.20] 02/20/2015  . Bipolar affective disorder, depressed, severe [F31.4] 02/19/2015  . Suicidal ideations [R45.851]   . Cocaine use disorder, severe, dependence [F14.20]   . Cannabis use disorder, moderate, dependence [F12.20]   . ANEMIA [D64.9] 05/11/2010  . TENDINITIS, LEFT WRIST [M65.88] 05/10/2010  . CANDIDIASIS, VAGINAL [B37.3] 02/04/2009  . LOSS OF WEIGHT [R63.4] 03/07/2008  . ALLERGIC RHINITIS [J30.9] 01/18/2008  . INSOMNIA [G47.00] 01/18/2008  . ANXIETY [F41.1] 08/31/2007  . GERD [K21.9] 06/18/2007  . DYSPEPSIA [K30] 06/18/2007  . CONSTIPATION [K59.00] 06/18/2007  . VAGINITIS, BACTERIAL [N76.0] 06/18/2007  . TRICHOMONAL VAGINITIS [A59.01] 09/28/2006  . CHLAMYDIA TRACHOMATIS, GU SITE NOS [A56.2] 06/17/2005   Total Time spent with patient: 25 minutes   Past Medical History:  Past Medical History  Diagnosis Date  . GERD (gastroesophageal reflux disease)   . Ectopic pregnancy 2008 and 2014  . Bipolar 1 disorder   . UTI (lower urinary tract infection)   . Trichomonas vaginitis   . Chlamydia   . Anxiety   . Insomnia   . Constipation     Past Surgical History  Procedure Laterality Date  . Ectopic pregnancy surgery     Family History:  Family History  Problem Relation Age of Onset  . Cancer Sister   . Alcohol abuse Neg Hx   . Arthritis Neg Hx   . Asthma Neg Hx   . Birth defects Neg Hx   . COPD Neg Hx   . Depression Neg Hx   .  Diabetes Neg Hx   . Drug abuse Neg Hx   . Early death Neg Hx   . Hearing loss Neg Hx   . Heart disease Neg Hx   . Hyperlipidemia Neg Hx   . Hypertension Neg Hx   . Kidney disease Neg Hx   . Learning disabilities Neg Hx   . Mental illness Neg Hx   . Mental retardation Neg Hx   . Miscarriages / Stillbirths Neg Hx   . Stroke Neg Hx   . Vision loss Neg Hx   . Varicose Veins Neg Hx    Social History:  History  Alcohol Use  . Yes    Comment:  social     History  Drug Use  . Yes  . Special: Cocaine    History   Social History  . Marital Status: Single    Spouse Name: N/A  . Number of Children: N/A  . Years of Education: N/A   Social History Main Topics  . Smoking status: Light Tobacco Smoker -- 0.25 packs/day    Types: Cigarettes    Last Attempt to Quit: 04/12/2014  . Smokeless tobacco: Never Used  . Alcohol Use: Yes     Comment: social  . Drug Use: Yes    Special: Cocaine  . Sexual Activity: Yes    Birth Control/ Protection: Condom   Other Topics Concern  . None   Social History Narrative   Additional History:    Sleep: Good  Appetite: improved   Assessment:   Musculoskeletal: Strength & Muscle Tone: within normal limits Gait & Station: normal Patient leans: N/A  Psychiatric Specialty Exam: Physical Exam  Review of Systems  Constitutional: Negative.   HENT: Negative.   Eyes: Negative.   Respiratory: Negative.   Cardiovascular: Negative.   Gastrointestinal: Negative.   Genitourinary: Negative.   Musculoskeletal: Negative.   Skin: Negative.   Neurological: Negative.   Psychiatric/Behavioral: Positive for depression and substance abuse.    Blood pressure 94/61, pulse 92, temperature 98 F (36.7 C), temperature source Oral, resp. rate 16, height 5' 8"  (1.727 m), weight 116 lb (52.617 kg), last menstrual period 02/16/2015.Body mass index is 17.64 kg/(m^2).  General Appearance: improved grooming   Eye Contact::  Good  Speech:  Normal Rate  Volume:  Normal  Mood:  less depressed .   Affect:   More reactive .  Still anxious .  Thought Process:  Linear  Orientation:  Full (Time, Place, and Person)  Thought Content:  at this time not internally preoccupied, states hallucinations have subsided, no delusions expressed   Suicidal Thoughts:  No at this time denies any thoughts of hurting self and  contracts for safety on unit   Homicidal Thoughts:  No  Memory:  Immediate;   Fair Recent;    Fair Remote;   Fair  Judgement:  Fair  Insight:  Improving , particularly regarding her substance abuse issues.  Psychomotor Activity:  Normal  Concentration:  Fair  Recall:  [employer] of Knowledge:Fair  Language: Good  Akathisia:  No  Handed:  Right  AIMS (if indicated):     Assets:  Desire for Improvement  ADL's: improving   Cognition: WNL  Sleep:  Number of Hours: 6.75   Current Medications: Current Facility-Administered Medications  Medication Dose Route Frequency Provider Last Rate Last Dose  . acetaminophen (TYLENOL) tablet 650 mg  650 mg Oral Q6H PRN Harriet Butte, NP      . alum & mag hydroxide-simeth (MAALOX/MYLANTA)  200-200-20 MG/5ML suspension 30 mL  30 mL Oral Q4H PRN Harriet Butte, NP   30 mL at 02/23/15 1807  . bisacodyl (DULCOLAX) EC tablet 5 mg  5 mg Oral Daily PRN Harriet Butte, NP   5 mg at 02/22/15 2137  . carbamazepine (TEGRETOL XR) 12 hr tablet 400 mg  400 mg Oral QHS Harriet Butte, NP   400 mg at 02/23/15 2120  . feeding supplement (ENSURE ENLIVE) (ENSURE ENLIVE) liquid 237 mL  237 mL Oral BID BM Encarnacion Slates, NP   237 mL at 02/24/15 1400  . hydrOXYzine (ATARAX/VISTARIL) tablet 25 mg  25 mg Oral Q6H PRN Harriet Butte, NP   25 mg at 02/24/15 1836  . ibuprofen (ADVIL,MOTRIN) tablet 600 mg  600 mg Oral Q8H PRN Harriet Butte, NP   600 mg at 02/20/15 1836  . magnesium hydroxide (MILK OF MAGNESIA) suspension 30 mL  30 mL Oral Daily PRN Harriet Butte, NP   30 mL at 02/21/15 2046  . metroNIDAZOLE (FLAGYL) tablet 500 mg  500 mg Oral BID Shuvon B Rankin, NP   500 mg at 02/24/15 1641  . mirtazapine (REMERON) tablet 30 mg  30 mg Oral QHS Jenne Campus, MD   30 mg at 02/23/15 2120  . risperiDONE (RISPERDAL) tablet 2 mg  2 mg Oral QHS Jenne Campus, MD   2 mg at 02/23/15 2120   Lab Results:  No results found for this or any previous visit (from the past 48 hour(s)).  Physical Findings: AIMS: Facial and Oral Movements Muscles of Facial Expression:  None, normal Lips and Perioral Area: None, normal Jaw: None, normal Tongue: None, normal,Extremity Movements Upper (arms, wrists, hands, fingers): None, normal Lower (legs, knees, ankles, toes): None, normal, Trunk Movements Neck, shoulders, hips: None, normal, Overall Severity Severity of abnormal movements (highest score from questions above): None, normal Incapacitation due to abnormal movements: None, normal Patient's awareness of abnormal movements (rate only patient's report): No Awareness, Dental Status Current problems with teeth and/or dentures?: No Does patient usually wear dentures?: No  CIWA:    COWS:  COWS Total Score: 2   Assessment- at this time patient  Continues to improve and is focusing more on her cocaine dependence, and how it has negatively affected her life. She has a poor sober support system and is currently invested in trying to go to an inpatient /residential rehab. Tolerating medications well at present.    Treatment Plan Summary: Continue medications as below Tegretol XR 400 mg  QHS for mood stabilization Risperdal 2 mg  QHS for mood control  Mirtazapine 30 mg QHS for  depression/insomnia. CSW working on disposition options.  .  Medical Decision Making:  Established Problem, Stable/Improving (1), Review of Psycho-Social Stressors (1), Review or order clinical lab tests (1) and Review of Medication Regimen & Side Effects (2)  Breslyn Abdo, MD 02/24/2015, 6:50 PM  6:50 PM

## 2015-02-24 NOTE — Plan of Care (Signed)
Problem: Diagnosis: Increased Risk For Suicide Attempt Goal: LTG-Patient Will Report Absence of Withdrawal Symptoms LTG (by discharge): Patient will report absence of withdrawal symptoms.  Outcome: Progressing Pt denies any Withdrawal Sx at this time.   Problem: Alteration in mood & ability to function due to Goal: LTG-Pt reports reduction in suicidal thoughts (Patient reports reduction in suicidal thoughts and is able to verbalize a safety plan for whenever patient is feeling suicidal)  Outcome: Progressing Pt senies SI at this time

## 2015-02-24 NOTE — Tx Team (Signed)
Interdisciplinary Treatment Plan Update (Adult)  Date:  02/24/2015  Time Reviewed:  8:42 AM   Progress in Treatment: Attending groups: Patient is attending groups. Participating in groups:  Patient engages in discussion Taking medication as prescribed:  Patient is taking medications Tolerating medication:  Patient is tolerating medications Family/Significant othe contact made:   No, patient declined collateral contact Patient understands diagnosis:Yes, patient understands diagnosis and need for treatment Discussing patient identified problems/goals with staff:  Yes, patient is able to express goals/problems Medical problems stabilized or resolved:  Yes Denies suicidal/homicidal ideation: Yes, patient is denying SI/HI. Issues/concerns per patient self-inventory:   Other:   Discharge Plan or Barriers:  Referrals made to [facility] and [facility] for residential treatment  Reason for Continuation of Hospitalization: Anxiety Depression Medication stabilization  Comments:  Continue medication stabilization   Additional comments:  Patient and CSW reviewed Patient Discharge Process Letter/Patient Involvement Form.  Patient verbalized understanding and signed form.  Estimated length of stay: 1-3 days  New goal(s):  Attendees: Patient 02/24/2015 8:42 AM   Family:   02/24/2015 8:42 AM   Physician:  Nehemiah MassedFernando Cobos, MD 02/24/2015 8:42 AM   Nursing:    02/24/2015 8:42 AM   Clinical Social Worker:  Juline PatchQuylle Trenia Tennyson, LCSW 02/24/2015 8:42 AM   Clinical Social Worker:  Samuella BruinKristin Drinkard, LCSW-A 02/24/2015 8:42 AM   Case Manager:  Onnie BoerJennifer Clark, RN 02/24/2015 8:42 AM   Other: Michaelle BirksBritney Guthrie, RN   02/24/2015 8:42 AM  Other:  Linton RumpAmanda Lawson, RN 02/24/2015  8:42 AM   Other:  02/24/2015 8:42 AM   Other:  02/24/2015 8:42 AM    02/24/2015 8:42 AM    02/24/2015 8:42 AM   Other:   02/24/2015 8:42 AM   Other:  02/24/2015 8:42 AM   Other:   02/24/2015 8:42 AM    Scribe for Treatment Team:   [name], Zalan Shidler Hairston, 02/24/2015   8:42  AM   \

## 2015-02-24 NOTE — Progress Notes (Signed)
D-pt sts she slept ok last night, pt c/o slight anxiety & depression this am, pt requests getting into a long term tx center A-pt took her am medications, but did not attend am group, pt requested a prn visteral this am R-cont to monitor for safety

## 2015-02-24 NOTE — Progress Notes (Signed)
D. Pt had been up and active in milieu this evening, did attend and participate in evening group activity. Pt reports that she is feeling better than when she came in and spoke [facility] she gets discharged from here. Pt reports that she has been sleeping ok and did not verbalize any complaints. Pt did receive all medications without incident. A. Support and encouragement provided. R. Safety maintained, will continue to monitor.

## 2015-02-24 NOTE — [facility] Group Notes (Signed)
[facility] LCSW Group Therapy      Feelings About Diagnosis 1:15 - 2:30 PM         02/24/2015 2:56 PM    Type of Therapy:  Group Therapy  Participation Level:  Active  Participation Quality:  Appropriate  Affect:  Appropriate  Cognitive: Appropriate  Insight:  Developing/Improving and Engaged  Engagement in Therapy:  Developing/Improving and Engaged  Modes of Intervention:  Discussion, Education, Exploration, Problem-Solving, Rapport Building, Support  Summary of Progress/Problems:  Patient actively participated in group. Patient discussed past and present diagnosis and the effects it has had on  life.  Patient talked about family and society being judgmental and the stigma associated with having a mental health diagnosis. She advised of being okay with her diagnosis but stated what others say about her is hurtful.  She shared it is not as though she woke up one day and decided to be addicted to drugs.  Patient shared she really wants help to get her life together and [facility].  [name], Sahvannah Rieser Hairston 02/24/2015  2:56 PM

## 2015-02-25 MED ORDER — RISPERIDONE 3 MG PO TABS
3.0000 mg | ORAL_TABLET | Freq: Every day | ORAL | Status: DC
  Administered 2015-02-25 – 2015-02-26 (×2): 3 mg via ORAL
  Filled 2015-02-25 (×4): qty 1

## 2015-02-25 NOTE — Plan of Care (Signed)
Problem: Diagnosis: Increased Risk For Suicide Attempt Goal: LTG-Patient Will Show Positive Response to Medication LTG (by discharge) : Patient will show positive response to medication and will participate in the development of the discharge plan.  Outcome: Progressing Pt stated she feels better than she did yesterday.  Goal: LTG-Patient Will Report Improved Mood and Deny Suicidal LTG (by discharge) Patient will report improved mood and deny suicidal ideation.  Outcome: Progressing Pt said she felt a little better today, and denied SI at this time

## 2015-02-25 NOTE — [facility] Group Notes (Signed)
Adult Psychoeducational Group Note  Date:  02/25/2015 Time:  9:24 PM  Group Topic/Focus:  Wrap-Up Group:   The focus of this group is to help patients review their daily goal of treatment and discuss progress on daily workbooks.  Participation Level:  Minimal  Participation Quality:  Attentive  Affect:  Appropriate  Cognitive:  Alert  Insight: Good  Engagement in Group:  Limited  Modes of Intervention:  Discussion  Additional Comments:  Natalie Fischer said she had a good day.  [facility] Friday [facility]Natalie Fischer.  Natalie Fischer A 02/25/2015, 9:24 PM

## 2015-02-25 NOTE — Progress Notes (Signed)
D: Pt denies SI/HI/AVH. Pt is pleasant and cooperative. Pt stated she felt better today, pt stated she will probably D/C on Friday and go to her friends house [facility]   A: Pt was offered support and encouragement. Pt was given scheduled medications. Pt was encourage to attend groups. Q 15 minute checks were done for safety.    R:Pt attends groups and interacts well with peers and staff. Pt is taking medication. Pt has no complaints at this time .Pt receptive to treatment and safety maintained on unit.

## 2015-02-25 NOTE — Progress Notes (Signed)
Recreation Therapy Notes  Date: 05.04.2016 Time: 9:30am Location: [address]   Group Topic: Stress Management  Goal Area(s) Addresses:  Patient will actively participate in stress management techniques presented during session.   Behavioral Response: Did not attend.   Isel Skufca L Imogene Gravelle, LRT/CTRS  Lakashia Collison L 02/25/2015 1:01 PM 

## 2015-02-25 NOTE — [facility] Group Notes (Signed)
[facility] LCSW Group Therapy  Emotional Regulation 1:15 - 2: 30 PM        02/25/2015     Type of Therapy:  Group Therapy  Participation Level:  Appropriate  Participation Quality:  Appropriate  Affect:  Appropriate  Cognitive:  Attentive Appropriate  Insight:  Developing/Improving Engaged  Engagement in Therapy:  Developing/Improving Engaged  Modes of Intervention:  Discussion Exploration Problem-Solving Supportive  Summary of Progress/Problems:  Group topic was emotional regulations.  Patient participated in the discussion and was able to identify an emotion that needed to regulated.  She shared disappointment is the emotion she has to control.  She advised the disappointment is in herself for not following through with what she needs to do to get her life on track. Patient was able to identify approprite coping skills.  [name], Natalie Fischer 02/25/2015

## 2015-02-25 NOTE — [facility] Group Notes (Signed)
[facility]   02/25/2015 11:07 AM    Participation Quality:  Appropraite  Mood/Affect:  Appropriate  Depression Rating:  1  Anxiety Rating:  1  Thoughts of Suicide:  No  Will you contract for safety?   NA  Current AVH:  No  Plan for Discharge/Comments:  Patient attended discharge planning group and actively participated in group. She reports feeling much better today.  [facility]  Patient advised of having a safe place to stay if she has to wait to get into a program.  Suicide prevention education reviewed and SPE document provided.   Transportation Means: Patient has transportation.   Supports:  Patient has a support system.   [name], Natalie Fischer 02/25/2015 11:07 AM

## 2015-02-25 NOTE — Progress Notes (Signed)
Patient ID: Natalie Fischer, female   DOB: April 09, 1981, 34 y.o.   MRN: [id number] [facility] MD Progress Note  02/25/2015 1:31 PM Natalie Fischer  MRN:  [id number]  Subjective:  Patient reports  Improvement - she does report ongoing depression but states it is better. She reported having a brief visual hallucination earlier today where she felt she saw " some type of bug or something" - this was fleeting . Today we discussed her substance abuse issue, which she acknowledges is " my main problem in life ". Patient is insightful and describes the following. She states she has been abusing cocaine for many years and has had difficulty maintaining sobriety for more than a few weeks at a time. She states she has a female friend who gives her cocaine for free, but often expecting sexual favors . She states that she knows " if I hang around with those people , I know I am going to use". She describes feeling severely depressed, guilty and with very poor self esteem after periods of drug use. States that her depressive " crashes" from cocaine use are getting worse and that she " cannot keep on living like this". In this context, she remains very motivated in going to an inpatient rehab upon discharge .  Objective:   I have discussed case with treatment team and have met with patient. Patient   Improved insofar as more reactive affect, improved range of affect, improved grooming. As noted, still depressed and ruminating about her increased risk  Of relapse if returns to her current social environment, which she described as above . She is reporting fleeting hallucinations but does not appear psychotic or internally preoccupied at this time and her thought process is well organized. No disruptive or agitated behaviors on the unit. Going to groups, visible in milieu. Denies medication side effects. As discussed with staff /SW referrals have been made for [facility] and [facility]- response from the latter pending, but it is likely  that there is going to be a waiting list .    Principal Problem: Bipolar 1 disorder, depressed, moderate  Diagnosis:   Patient Active Problem List   Diagnosis Date Noted  . BV (bacterial vaginosis) [N76.0, A49.9]   . Bipolar 1 disorder, depressed, moderate [F31.32] 02/20/2015  . 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 Cocaine dependence [F14.20] 02/20/2015  . Bipolar affective disorder, depressed, severe [F31.4] 02/19/2015  . Suicidal ideations [R45.851]   . Cocaine use disorder, severe, dependence [F14.20]   . Cannabis use disorder, moderate, dependence [F12.20]   . ANEMIA [D64.9] 05/11/2010  . TENDINITIS, LEFT WRIST [M65.88] 05/10/2010  . CANDIDIASIS, VAGINAL [B37.3] 02/04/2009  . LOSS OF WEIGHT [R63.4] 03/07/2008  . ALLERGIC RHINITIS [J30.9] 01/18/2008  . INSOMNIA [G47.00] 01/18/2008  . ANXIETY [F41.1] 08/31/2007  . GERD [K21.9] 06/18/2007  . DYSPEPSIA [K30] 06/18/2007  . CONSTIPATION [K59.00] 06/18/2007  . VAGINITIS, BACTERIAL [N76.0] 06/18/2007  . TRICHOMONAL VAGINITIS [A59.01] 09/28/2006  . CHLAMYDIA TRACHOMATIS, GU SITE NOS [A56.2] 06/17/2005   Total Time spent with patient: 25 minutes   Past Medical History:  Past Medical History  Diagnosis Date  . GERD (gastroesophageal reflux disease)   . Ectopic pregnancy 2008 and 2014  . Bipolar 1 disorder   . UTI (lower urinary tract infection)   . Trichomonas vaginitis   . Chlamydia   . Anxiety   . Insomnia   . Constipation     Past Surgical History  Procedure Laterality Date  . Ectopic  pregnancy surgery     Family History:  Family History  Problem Relation Age of Onset  . Cancer Sister   . Alcohol abuse Neg Hx   . Arthritis Neg Hx   . Asthma Neg Hx   . Birth defects Neg Hx   . COPD Neg Hx   . Depression Neg Hx   . Diabetes Neg Hx   . Drug abuse Neg Hx   . Early death Neg Hx   . Hearing loss Neg Hx   . Heart disease Neg Hx   . Hyperlipidemia Neg Hx   . Hypertension Neg Hx    . Kidney disease Neg Hx   . Learning disabilities Neg Hx   . Mental illness Neg Hx   . Mental retardation Neg Hx   . Miscarriages / Stillbirths Neg Hx   . Stroke Neg Hx   . Vision loss Neg Hx   . Varicose Veins Neg Hx    Social History:  History  Alcohol Use  . Yes    Comment: social     History  Drug Use  . Yes  . Special: Cocaine    History   Social History  . Marital Status: Single    Spouse Name: N/A  . Number of Children: N/A  . Years of Education: N/A   Social History Main Topics  . Smoking status: Light Tobacco Smoker -- 0.25 packs/day    Types: Cigarettes    Last Attempt to Quit: 04/12/2014  . Smokeless tobacco: Never Used  . Alcohol Use: Yes     Comment: social  . Drug Use: Yes    Special: Cocaine  . Sexual Activity: Yes    Birth Control/ Protection: Condom   Other Topics Concern  . None   Social History Narrative   Additional History:    Sleep: Good  Appetite: improved   Assessment:   Musculoskeletal: Strength & Muscle Tone: within normal limits Gait & Station: normal Patient leans: N/A  Psychiatric Specialty Exam: Physical Exam  ROS  Blood pressure 91/73, pulse 100, temperature 97.7 F (36.5 C), temperature source Oral, resp. rate 16, height _0  (1.727 m), weight 116 lb (52.617 kg), last menstrual period 02/16/2015.Body mass index is 17.64 kg/(m^2).  General Appearance: Well Groomed  Eye Contact::  Good  Speech:  Normal Rate  Volume:  Normal  Mood:  less depressed . - still feels sad, however   Affect:   More reactive .  Still anxious , particularly about disposition issues as above .  Thought Process:  Linear  Orientation:  Full (Time, Place, and Person)  Thought Content:  Reports fleeting visual hallucinations this AM- not internally preoccupied, no auditory halls .  Suicidal Thoughts:  No at this time denies any thoughts of hurting self and  contracts for safety on unit   Homicidal Thoughts:  No  Memory:  Recent and remote  grossly intact  Judgement:  Fair  Insight:  Improving , particularly regarding her substance abuse issues.  Psychomotor Activity:  Normal  Concentration:  Fair  Recall:  [employer] of Knowledge:Fair  Language: Good  Akathisia:  No  Handed:  Right  AIMS (if indicated):     Assets:  Desire for Improvement  ADL's: improving   Cognition: WNL  Sleep:  Number of Hours: 6.75   Current Medications: Current Facility-Administered Medications  Medication Dose Route Frequency Provider Last Rate Last Dose  . acetaminophen (TYLENOL) tablet 650 mg  650 mg Oral Q6H PRN Ijeoma E  Danford Bad, NP      . alum & mag hydroxide-simeth (MAALOX/MYLANTA) 200-200-20 MG/5ML suspension 30 mL  30 mL Oral Q4H PRN Harriet Butte, NP   30 mL at 02/23/15 1807  . bisacodyl (DULCOLAX) EC tablet 5 mg  5 mg Oral Daily PRN Harriet Butte, NP   5 mg at 02/22/15 2137  . carbamazepine (TEGRETOL XR) 12 hr tablet 400 mg  400 mg Oral QHS Harriet Butte, NP   400 mg at 02/24/15 2139  . feeding supplement (ENSURE ENLIVE) (ENSURE ENLIVE) liquid 237 mL  237 mL Oral BID BM Encarnacion Slates, NP   237 mL at 02/25/15 1000  . hydrOXYzine (ATARAX/VISTARIL) tablet 25 mg  25 mg Oral Q6H PRN Harriet Butte, NP   25 mg at 02/24/15 1836  . ibuprofen (ADVIL,MOTRIN) tablet 600 mg  600 mg Oral Q8H PRN Harriet Butte, NP   600 mg at 02/20/15 1836  . magnesium hydroxide (MILK OF MAGNESIA) suspension 30 mL  30 mL Oral Daily PRN Harriet Butte, NP   30 mL at 02/21/15 2046  . metroNIDAZOLE (FLAGYL) tablet 500 mg  500 mg Oral BID Shuvon B Rankin, NP   500 mg at 02/25/15 0805  . mirtazapine (REMERON) tablet 30 mg  30 mg Oral QHS Jenne Campus, MD   30 mg at 02/24/15 2139  . risperiDONE (RISPERDAL) tablet 2 mg  2 mg Oral QHS Jenne Campus, MD   2 mg at 02/24/15 2139   Lab Results:  No results found for this or any previous visit (from the past 48 hour(s)).  Physical Findings: AIMS: Facial and Oral Movements Muscles of Facial Expression: None,  normal Lips and Perioral Area: None, normal Jaw: None, normal Tongue: None, normal,Extremity Movements Upper (arms, wrists, hands, fingers): None, normal Lower (legs, knees, ankles, toes): None, normal, Trunk Movements Neck, shoulders, hips: None, normal, Overall Severity Severity of abnormal movements (highest score from questions above): None, normal Incapacitation due to abnormal movements: None, normal Patient's awareness of abnormal movements (rate only patient's report): No Awareness, Dental Status Current problems with teeth and/or dentures?: No Does patient usually wear dentures?: No  CIWA:    COWS:  COWS Total Score: 2   Assessment-  Patient much improved compared to admission, but still anxious, ruminative , particularly regarding disposition and fears of relapsing on cocaine if returns home, as she has poor sober support system. Insight into how cocaine is negatively affecting her mood and quality of life is improved, and she seems very motivated in trying to consolidate sobriety and going to an inpatient rehab setting at this time. Denies medication side effects. Describes fleeting visual halls.    Treatment Plan Summary: Continue medications as below Tegretol XR 400 mg  QHS for mood stabilization Increase Risperdal  To 3 mgrs   QHS for mood control and to address reported hallucinatory experiences Mirtazapine 30 mg QHS for  depression/insomnia. CSW working on disposition options.  .  Medical Decision Making:  Established Problem, Stable/Improving (1), Review of Psycho-Social Stressors (1), Review or order clinical lab tests (1) and Review of Medication Regimen & Side Effects (2)  COBOS, FERNANDO, MD 02/25/2015, 1:31 PM  1:31 PM

## 2015-02-25 NOTE — Progress Notes (Signed)
D: Pt presents anxious on approach this morning. Pt reported that she is endorsing visual hallucinations, seeing bugs crawl on her, but when she go to look, nothing is there. Pt denies auditory hallucinations. Pt denies suicidal/homicidal hallucinations. Pt denies depression. Pt rates anxiety 3/10. Pt reported that she slept well last night. Pt denies any side effects to her meds. Pt compliant with taking meds and attending groups. A: Medications administered as ordered per MD. Verbal support given. Pt encouraged to attend groups. 15 minute checks performed for safety.  R: Pt stated goal is to get into a long-term tx facility. Pt safety maintained.

## 2015-02-25 NOTE — Progress Notes (Signed)
Recreation Therapy Notes   Animal-Assisted Activity (AAA) Program Checklist/Progress Notes Patient Eligibility Criteria Checklist & Daily Group note for Rec Tx Intervention  Date: 05.03.2016 Time: 2:45pm [address]   AAA/T Program Assumption of Risk Form signed by Patient/ or Parent Legal Guardian yes  Patient is free of allergies or sever asthma yes  Patient reports no fear of animals yes  Patient reports no history of cruelty to animals yes  Patient understands his/her participation is voluntary yes  Patient washes hands before animal contact yes  Patient washes hands after animal contact yes  Behavioral Response: Engaged, Appropriate    Education: Charity fundraiserHand Washing, Appropriate Animal Interaction   Education Outcome: Acknowledges education.   Clinical Observations/Feedback: Patient interacted appropriately with therapy dog a peers during session. Patient asked appropriate questions about therapy dog and his training.   Marykay Lexenise L Jessamine Barcia, LRT/CTRS  [place] Masaki L 02/25/2015 8:47 AM

## 2015-02-26 NOTE — Progress Notes (Signed)
Patient ID: Natalie Fischer, female   DOB: 02-16-1981, 34 y.o.   MRN: [id number] [facility] MD Progress Note  02/26/2015 5:10 PM [name]  MRN:  [id number]  Subjective:  Patient reports she is  Feeling better in general. Of note, she states she has a good friend who is abstinent from drugs and that she can go stay with her for a few days, while she awaits for a bed to become available at [facility] or [place]. She states she is tolerating medications well and she denies medication side effects. She is having some intermittent cravings for cocaine but states she feels very motivated in continuing to work on sobriety.   Objective:   I have discussed case with treatment team and have met with patient. Patient  Continues to improve , and compared to admission is much better groomed, more reactive affect, and at this time no longer endorsing any psychotic symptoms- nor does she present internally preoccupied or bizarre. As discussed with staff, patient is wanting to go to an inpatient rehab, but there is likely to be a waiting list of several days- patient stating she has a good /supportive/sober friend she can go stay with in the interim until a bed becomes available.   No disruptive behaviors on unit . She is going to groups and is more participative . No current medication side effects endorsed.    Principal Problem: Bipolar 1 disorder, depressed, moderate  Diagnosis:   Patient Active Problem List   Diagnosis Date Noted  . BV (bacterial vaginosis) [N76.0, A49.9]   . Bipolar 1 disorder, depressed, moderate [F31.32] 02/20/2015  . 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 Cocaine dependence [F14.20] 02/20/2015  . Bipolar affective disorder, depressed, severe [F31.4] 02/19/2015  . Suicidal ideations [R45.851]   . Cocaine use disorder, severe, dependence [F14.20]   . Cannabis use disorder, moderate, dependence [F12.20]   . ANEMIA [D64.9] 05/11/2010  . TENDINITIS, LEFT  WRIST [M65.88] 05/10/2010  . CANDIDIASIS, VAGINAL [B37.3] 02/04/2009  . LOSS OF WEIGHT [R63.4] 03/07/2008  . ALLERGIC RHINITIS [J30.9] 01/18/2008  . INSOMNIA [G47.00] 01/18/2008  . ANXIETY [F41.1] 08/31/2007  . GERD [K21.9] 06/18/2007  . DYSPEPSIA [K30] 06/18/2007  . CONSTIPATION [K59.00] 06/18/2007  . VAGINITIS, BACTERIAL [N76.0] 06/18/2007  . TRICHOMONAL VAGINITIS [A59.01] 09/28/2006  . CHLAMYDIA TRACHOMATIS, GU SITE NOS [A56.2] 06/17/2005   Total Time spent with patient: 20 minutes  Past Medical History:  Past Medical History  Diagnosis Date  . GERD (gastroesophageal reflux disease)   . Ectopic pregnancy 2008 and 2014  . Bipolar 1 disorder   . UTI (lower urinary tract infection)   . Trichomonas vaginitis   . Chlamydia   . Anxiety   . Insomnia   . Constipation     Past Surgical History  Procedure Laterality Date  . Ectopic pregnancy surgery     Family History:  Family History  Problem Relation Age of Onset  . Cancer Sister   . Alcohol abuse Neg Hx   . Arthritis Neg Hx   . Asthma Neg Hx   . Birth defects Neg Hx   . COPD Neg Hx   . Depression Neg Hx   . Diabetes Neg Hx   . Drug abuse Neg Hx   . Early death Neg Hx   . Hearing loss Neg Hx   . Heart disease Neg Hx   . Hyperlipidemia Neg Hx   . Hypertension Neg Hx   . Kidney disease Neg Hx   .  Learning disabilities Neg Hx   . Mental illness Neg Hx   . Mental retardation Neg Hx   . Miscarriages / Stillbirths Neg Hx   . Stroke Neg Hx   . Vision loss Neg Hx   . Varicose Veins Neg Hx    Social History:  History  Alcohol Use  . Yes    Comment: social     History  Drug Use  . Yes  . Special: Cocaine    History   Social History  . Marital Status: Single    Spouse Name: N/A  . Number of Children: N/A  . Years of Education: N/A   Social History Main Topics  . Smoking status: Light Tobacco Smoker -- 0.25 packs/day    Types: Cigarettes    Last Attempt to Quit: 04/12/2014  . Smokeless tobacco: Never  Used  . Alcohol Use: Yes     Comment: social  . Drug Use: Yes    Special: Cocaine  . Sexual Activity: Yes    Birth Control/ Protection: Condom   Other Topics Concern  . None   Social History Narrative   Additional History:    Sleep: Good  Appetite: good and states she has regained some weight she had lost prior to admission   Assessment:   Musculoskeletal: Strength & Muscle Tone: within normal limits Gait & Station: normal Patient leans: N/A  Psychiatric Specialty Exam: Physical Exam  Review of Systems  Constitutional: Negative.   HENT: Negative.   Eyes: Negative.   Respiratory: Negative.   Cardiovascular: Negative.   Gastrointestinal: Positive for constipation.  Genitourinary: Negative.   Musculoskeletal: Negative.   Skin: Negative.   Neurological: Negative.   Psychiatric/Behavioral: Positive for depression and substance abuse.    Blood pressure 103/71, pulse 100, temperature 97.7 F (36.5 C), temperature source Oral, resp. rate 18, height 5' 8"  (1.727 m), weight 116 lb (52.617 kg), last menstrual period 02/16/2015.Body mass index is 17.64 kg/(m^2).  General Appearance: improved grooming   Eye Contact::  Good  Speech:  Normal Rate  Volume:  Normal  Mood:  less depressed .   Affect:   More reactive .   Less  anxious .  Thought Process:  Linear  Orientation:  Full (Time, Place, and Person)  Thought Content:  no hallucinations at this time- has stated they continue to happen at times, but to a lesser degree than  before- none today as of yet, not internally preoccupied, no delusions expressed   Suicidal Thoughts:  No at this time denies any thoughts of hurting self and  contracts for safety on unit   Homicidal Thoughts:  No  Memory:  Immediate;   Fair Recent;   Fair Remote;   Fair  Judgement:  Fair  Insight:  Improving , particularly regarding her substance abuse issues.  Psychomotor Activity:  Normal  Concentration:  Fair  Recall:  [employer] of  Knowledge:Fair  Language: Good  Akathisia:  No  Handed:  Right  AIMS (if indicated):     Assets:  Desire for Improvement  ADL's: improving   Cognition: WNL  Sleep:  Number of Hours: 6.75   Current Medications: Current Facility-Administered Medications  Medication Dose Route Frequency Provider Last Rate Last Dose  . acetaminophen (TYLENOL) tablet 650 mg  650 mg Oral Q6H PRN Harriet Butte, NP      . alum & mag hydroxide-simeth (MAALOX/MYLANTA) 200-200-20 MG/5ML suspension 30 mL  30 mL Oral Q4H PRN Harriet Butte, NP   30  mL at 02/23/15 1807  . bisacodyl (DULCOLAX) EC tablet 5 mg  5 mg Oral Daily PRN Harriet Butte, NP   5 mg at 02/25/15 1554  . carbamazepine (TEGRETOL XR) 12 hr tablet 400 mg  400 mg Oral QHS Harriet Butte, NP   400 mg at 02/25/15 2151  . feeding supplement (ENSURE ENLIVE) (ENSURE ENLIVE) liquid 237 mL  237 mL Oral BID BM Encarnacion Slates, NP   237 mL at 02/26/15 1400  . hydrOXYzine (ATARAX/VISTARIL) tablet 25 mg  25 mg Oral Q6H PRN Harriet Butte, NP   25 mg at 02/25/15 2151  . ibuprofen (ADVIL,MOTRIN) tablet 600 mg  600 mg Oral Q8H PRN Harriet Butte, NP   600 mg at 02/20/15 1836  . magnesium hydroxide (MILK OF MAGNESIA) suspension 30 mL  30 mL Oral Daily PRN Harriet Butte, NP   30 mL at 02/21/15 2046  . metroNIDAZOLE (FLAGYL) tablet 500 mg  500 mg Oral BID Shuvon B Rankin, NP   500 mg at 02/26/15 0755  . mirtazapine (REMERON) tablet 30 mg  30 mg Oral QHS Jenne Campus, MD   30 mg at 02/25/15 2151  . risperiDONE (RISPERDAL) tablet 3 mg  3 mg Oral QHS Jenne Campus, MD   3 mg at 02/25/15 2151   Lab Results:  No results found for this or any previous visit (from the past 48 hour(s)).  Physical Findings: AIMS: Facial and Oral Movements Muscles of Facial Expression: None, normal Lips and Perioral Area: None, normal Jaw: None, normal Tongue: None, normal,Extremity Movements Upper (arms, wrists, hands, fingers): None, normal Lower (legs, knees, ankles, toes):  None, normal, Trunk Movements Neck, shoulders, hips: None, normal, Overall Severity Severity of abnormal movements (highest score from questions above): None, normal Incapacitation due to abnormal movements: None, normal Patient's awareness of abnormal movements (rate only patient's report): No Awareness, Dental Status Current problems with teeth and/or dentures?: No Does patient usually wear dentures?: No  CIWA:    COWS:  COWS Total Score: 2   Assessment- patient continues to improve and stabilize. At this time no current psychotic symptoms and is no longer significantly depressed. Tolerating medications well. Future oriented, and invested in going to a Rehab setting..    Treatment Plan Summary: Continue medications as below Tegretol XR 400 mg  QHS for mood stabilization Risperdal 2 mg  QHS for mood control  Mirtazapine 30 mg QHS for  depression/insomnia. Consider discharge soon as she continues to improve and as she states she has found a sober, supportive setting to help her bridge time from hospital discharge to going to rehab.   .  Medical Decision Making:  Established Problem, Stable/Improving (1), Review of Psycho-Social Stressors (1), Review or order clinical lab tests (1) and Review of Medication Regimen & Side Effects (2)  COBOS, FERNANDO, MD 02/26/2015, 5:10 PM  5:10 PM

## 2015-02-26 NOTE — [facility] Group Notes (Signed)
[facility] LCSW Group Therapy  [facility] 1:15 - 2:30 PM  02/26/2015 3:20 PM   Type of Therapy:  Group Therapy  Participation Level: Active  Participation Quality:  Attentive  Affect:  Appropriate  Cognitive:  Appropriate  Insight:  Developing/Improving   Engagement in Therapy:  Developing/Improving   Modes of Intervention:  Discussion, Education, Exploration, Problem-Solving, Rapport Building, Support   Summary of Progress/Problems:   Patient was attentive to speaker from the [employer] [facility] as he shared his story of dealing with mental health/substance abuse issues and overcoming it by working a recovery program.  Patient expressed interest in their programs and services and received information on their agency.  She shared she identified with speaker's story.   [name], Mahlon Gabrielle Hairston 02/26/2015  3:20 PM

## 2015-02-26 NOTE — [facility] Group Notes (Signed)
[facility] Group Notes:  (Nursing/MHT/Case Management/Adjunct)  Date:  02/26/2015  Time:  9:51 AM  Type of Therapy:  Nurse Education  Participation Level:  Active  Participation Quality:  Appropriate  Affect:  Appropriate  Cognitive:  Alert, Appropriate and Oriented  Insight:  Appropriate  Engagement in Group:  Engaged  Modes of Intervention:  Education and Support  Summary of Progress/Problems: Navaeh shared that her goal is to continue to stay clean.  Maurine SimmeringShugart, Cliford Sequeira M 02/26/2015, 9:51 AM

## 2015-02-26 NOTE — Progress Notes (Signed)
Patient ID: Natalie Fischer, female   DOB: 06-Mar-1981, 34 y.o.   MRN: [id number]  D: Client reports "everythings good, [name]" "I'm getting use to my medicine, a little blurry eyed" Client pleasant, smiling. A: Writer introduced self to client, encouraged her to speak with doctor about symptoms. Staff will monitor q615min for safety. R: Client is safe on the unit, attended group.

## 2015-02-26 NOTE — Progress Notes (Signed)
D: Natalie Fischer denies SI but endorse occasional voices. She contracts for safety and has been very pleasant today.  A: Meds given as ordered. Q15 safety checks maintained. Support/encouragement provided. R: Pt remained safe and proceeds with treatment.

## 2015-02-27 MED ORDER — IBUPROFEN 200 MG PO TABS: 800.0000 mg | ORAL_TABLET | Freq: Three times a day (TID) | ORAL | Status: DC | PRN

## 2015-02-27 MED ORDER — HYDROXYZINE HCL 25 MG PO TABS: 25.0000 mg | ORAL_TABLET | Freq: Four times a day (QID) | ORAL | Status: DC | PRN

## 2015-02-27 MED ORDER — RISPERIDONE 3 MG PO TABS: 3.0000 mg | ORAL_TABLET | Freq: Every day | ORAL | Status: DC

## 2015-02-27 MED ORDER — MIRTAZAPINE 30 MG PO TABS: 30.0000 mg | ORAL_TABLET | Freq: Every day | ORAL | Status: DC

## 2015-02-27 MED ORDER — CARBAMAZEPINE ER 400 MG PO TB12: 400.0000 mg | ORAL_TABLET | Freq: Every day | ORAL | Status: DC

## 2015-02-27 MED ORDER — METRONIDAZOLE 500 MG PO TABS: 500.0000 mg | ORAL_TABLET | Freq: Two times a day (BID) | ORAL | Status: DC

## 2015-02-27 NOTE — [facility] Group Notes (Signed)
[facility]   02/27/2015 11:29 AM    Participation Quality:  Appropraite  Mood/Affect:  Appropriate  Depression Rating:  0  Anxiety Rating:  0  Thoughts of Suicide:  No  Will you contract for safety?   NA  Current AVH:  No  Plan for Discharge/Comments:  Patient attended discharge planning group and actively participated in group. She reports doing well and being ready to discharge today.  [facility]  Suicide prevention education reviewed and SPE document provided.   Transportation Means: Patient has transportation.   Supports:  Patient has a support system.   [name], [name]

## 2015-02-27 NOTE — Discharge Summary (Signed)
Physician Discharge Summary Note  Patient:  Natalie Fischer is an 34 y.o., female  MRN:  [id number]  DOB:  15-Oct-1981  Patient phone:  [phone] (home)   Patient address:   [address] [place] [address],   Total Time spent with patient: Greater than 30 minutes  Date of Admission:  02/20/2015  Date of Discharge: 02/27/15  Reason for Admission: worsening symptoms of bipolar disorder with psychotic features.  Principal Problem: Bipolar 1 disorder, depressed, moderate Discharge Diagnoses: Patient Active Problem List   Diagnosis Date Noted  . BV (bacterial vaginosis) [N76.0, A49.9]   . Bipolar 1 disorder, depressed, moderate [F31.32] 02/20/2015  . 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 Cocaine dependence [F14.20] 02/20/2015  . Bipolar affective disorder, depressed, severe [F31.4] 02/19/2015  . Suicidal ideations [R45.851]   . Cocaine use disorder, severe, dependence [F14.20]   . Cannabis use disorder, moderate, dependence [F12.20]   . ANEMIA [D64.9] 05/11/2010  . TENDINITIS, LEFT WRIST [M65.88] 05/10/2010  . CANDIDIASIS, VAGINAL [B37.3] 02/04/2009  . LOSS OF WEIGHT [R63.4] 03/07/2008  . ALLERGIC RHINITIS [J30.9] 01/18/2008  . INSOMNIA [G47.00] 01/18/2008  . ANXIETY [F41.1] 08/31/2007  . GERD [K21.9] 06/18/2007  . DYSPEPSIA [K30] 06/18/2007  . CONSTIPATION [K59.00] 06/18/2007  . VAGINITIS, BACTERIAL [N76.0] 06/18/2007  . TRICHOMONAL VAGINITIS [A59.01] 09/28/2006  . CHLAMYDIA TRACHOMATIS, GU SITE NOS [A56.2] 06/17/2005   Musculoskeletal: Strength & Muscle Tone: within normal limits Gait & Station: normal Patient leans: N/A  Psychiatric Specialty Exam: Physical Exam  Psychiatric: Her speech is normal and behavior is normal. Judgment and thought content normal. Her mood appears not anxious. Her affect is not angry, not blunt, not labile and not inappropriate. Cognition and memory are normal. She does not exhibit a  depressed mood.    Review of Systems  Constitutional: Negative.   HENT: Negative.   Eyes: Negative.   Respiratory: Negative.   Cardiovascular: Negative.   Gastrointestinal: Negative.   Genitourinary: Negative.   Musculoskeletal: Negative.   Skin: Negative.   Neurological: Negative.   Endo/Heme/Allergies: Negative.   Psychiatric/Behavioral: Positive for depression (Stable), hallucinations (Hx of) and substance abuse (Cocaine dependence, Cannabis dependence). Negative for suicidal ideas and memory loss. The patient has insomnia (Stable). The patient is not nervous/anxious.     Blood pressure 96/70, pulse 98, temperature 97.6 F (36.4 C), temperature source Oral, resp. rate 18, height 5\' 8"  (1.727 m), weight 56.7 kg (125 lb), last menstrual period 02/16/2015.Body mass index is 19.01 kg/(m^2).  See Md's SRA   Have you used any form of tobacco in the last 30 days? (Cigarettes, Smokeless Tobacco, Cigars, and/or Pipes): Yes   Has this patient used any form of tobacco in the last 30 days? (Cigarettes, Smokeless   Tobacco, Cigars, and/or Pipes) Yes, A prescription for an FDA-approved tobacco cessation medication was offered at discharge and the patient refused  Past Medical History:  Past Medical History  Diagnosis Date  . GERD (gastroesophageal reflux disease)   . Ectopic pregnancy 2008 and 2014  . Bipolar 1 disorder   . UTI (lower urinary tract infection)   . Trichomonas vaginitis   . Chlamydia   . Anxiety   . Insomnia   . Constipation     Past Surgical History  Procedure Laterality Date  . Ectopic pregnancy surgery     Family History:  Family History  Problem Relation Age of Onset  . Cancer Sister   . Alcohol abuse Neg Hx   . Arthritis  Neg Hx   . Asthma Neg Hx   . Birth defects Neg Hx   . COPD Neg Hx   . Depression Neg Hx   . Diabetes Neg Hx   . Drug abuse Neg Hx   . Early death Neg Hx   . Hearing loss Neg Hx   . Heart disease Neg Hx   . Hyperlipidemia Neg Hx   .  Hypertension Neg Hx   . Kidney disease Neg Hx   . Learning disabilities Neg Hx   . Mental illness Neg Hx   . Mental retardation Neg Hx   . Miscarriages / Stillbirths Neg Hx   . Stroke Neg Hx   . Vision loss Neg Hx   . Varicose Veins Neg Hx    Social History:  History  Alcohol Use  . Yes    Comment: social     History  Drug Use  . Yes  . Special: Cocaine    History   Social History  . Marital Status: Single    Spouse Name: N/A  . Number of Children: N/A  . Years of Education: N/A   Social History Main Topics  . Smoking status: Light Tobacco Smoker -- 0.25 packs/day    Types: Cigarettes    Last Attempt to Quit: 04/12/2014  . Smokeless tobacco: Never Used  . Alcohol Use: Yes     Comment: social  . Drug Use: Yes    Special: Cocaine  . Sexual Activity: Yes    Birth Control/ Protection: Condom   Other Topics Concern  . None   Social History Narrative   Risk to Self: Is patient at risk for suicide?: No Risk to Others: No Prior Inpatient Therapy: Yes Prior Outpatient Therapy: Yes  Level of Care:  OP  Hospital Course:  Natalie Fischer is a 34 y.o. female patient admitted with Bipolar disorder, depressed with Psychosis & Substance abuse issues. She was evaluated for increased feelings of depression and anxiety. Patient reports lack of concentration, and racing thoughts. She reports poor sleep and appetite.She has a hx of Bipolar disorder and has been on medications until she could no longer afford her medicines. She reports seeing shadows behind her and hears people talking but goes out looking with no body in the house but her. She also reported using Marijuana seldomly and Cocaine use daily. Patient reports that she is suicidal at this time since her life does not seem to change for the better and plans to OD on her pills.  Natalie Fischer was admitted to the adult unit for Suicidal ideations related to worsening symptoms of bipolar disorder & being off of her  medicines because she was unable to afford them. She was also using drugs, however, does not need detox treatment as cocaine and or cannabis has no established detox protocols. During her admission assessment, she was evaluated and her symptoms identified. Medication management was discussed and initiated targeting her presenting symptoms. She was oriented to the unit and encouraged to participate in the unit programming. Her other presenting medical problems were identified and treated appropriately (Trichomonal vaginitis, other STD). She tolerated her treatment regimen without any significant adverse effects and or reactions.  During Natalie Fischer hospital stay, she was evaluated daily by a clinical provider to ascertain her response to her treatment regimen. As the day goes by, improvement was noted by the patient's report of decreasing symptoms, improved sleep, affect, medication tolerance, behavior & participation in the unit programming.  She was required on daily  basis to complete a self inventory asssessment noting mood, mental status, pain, any new symptoms, anxiety and or concerns. Her symptoms responded well to her treatment regimen. Also, being in a therapeutic and supportive environment assisted in her mood stability. Natalie Fischer did present appropriate behavior & was motivated for recovery. She worked closely with the treatment team and case manager to develop a discharge plan with appropriate goals to maintain mood stability after discharge. Coping skills, problem solving as well as relaxation therapies were also part of her unit programming.  On this day of discharge, Natalie Fischer is in much improved condition than upon admission. Her symptoms were reported as significantly decreased or resolved completely. Upon discharge, she adamantly denies any SI/HI & voiced no AVH. She was motivated to continue taking medication with a goal of continued improvement in mental health. Natalie Fischer was discharged home with a  plan to follow up as noted below. She was medicated & discharged on; Tegretol XR 400 mg for mood stabilization, Hydroxyzine 25 mg for anxiety, Mirtazapine 30 mg for depression/anxiety & Risperdal 3 mg for mood control. Natalie Fischer received from the [facility] pharmacy, a 14 days worth, supply samples of her [facility] discharge medications. She left [facility] with all personal belongings in no apparent distress. Transportation per patient arrangement.  Consults:  psychiatry  Significant Diagnostic Studies:  labs: CBC with diff, CMP, UDS, toxicology tests, U/A, results reviewed, stable Discharge Vitals:   Blood pressure 96/70, pulse 98, temperature 97.6 F (36.4 C), temperature source Oral, resp. rate 18, height  (1.727 m), weight 56.7 kg (125 lb), last menstrual period 02/16/2015. Body mass index is 19.01 kg/(m^2). Lab Results:   No results found for this or any previous visit (from the past 72 hour(s)).  Physical Findings: AIMS: Facial and Oral Movements Muscles of Facial Expression: None, normal Lips and Perioral Area: None, normal Jaw: None, normal Tongue: None, normal,Extremity Movements Upper (arms, wrists, hands, fingers): None, normal Lower (legs, knees, ankles, toes): None, normal, Trunk Movements Neck, shoulders, hips: None, normal, Overall Severity Severity of abnormal movements (highest score from questions above): None, normal Incapacitation due to abnormal movements: None, normal Patient's awareness of abnormal movements (rate only patient's report): No Awareness, Dental Status Current problems with teeth and/or dentures?: No (deteriorated teeth) Does patient usually wear dentures?: No  CIWA:    COWS:  COWS Total Score: 2   See Psychiatric Specialty Exam and Suicide Risk Assessment completed by Attending Physician prior to discharge.  Discharge destination:  Home  Is patient on multiple antipsychotic therapies at discharge:  No   Has Patient had three or more failed trials of  antipsychotic monotherapy by history:  No  Recommended Plan for Multiple Antipsychotic Therapies: NA    Medication List    STOP taking these medications        b complex vitamins tablet      TAKE these medications      Indication   carbamazepine 400 MG 12 hr tablet  Commonly known as:  TEGRETOL XR  Take 1 tablet (400 mg total) by mouth at bedtime. For mood stabilization   Indication:  Mood stabilization     hydrOXYzine 25 MG tablet  Commonly known as:  ATARAX/VISTARIL  Take 1 tablet (25 mg total) by mouth every 6 (six) hours as needed for anxiety (sleep).   Indication:  Anxiety/sleep     ibuprofen 200 MG tablet  Commonly known as:  ADVIL,MOTRIN  Take 4 tablets (800 mg total) by mouth every 8 (eight) hours as  needed for moderate pain.   Indication:  Mild to Moderate Pain     metroNIDAZOLE 500 MG tablet  Commonly known as:  FLAGYL  Take 1 tablet (500 mg total) by mouth 2 (two) times daily. For yeast infection   Indication:  Vaginosis caused by Bacteria, Yeast infection     mirtazapine 30 MG tablet  Commonly known as:  REMERON  Take 1 tablet (30 mg total) by mouth at bedtime. For depression/sleep   Indication:  Trouble Sleeping, Major Depressive Disorder     risperiDONE 3 MG tablet  Commonly known as:  RISPERDAL  Take 1 tablet (3 mg total) by mouth at bedtime. For mood control   Indication:  Mood control       Follow-up Information    Follow up with [facility] On 03/03/2015.   Why:  You are scheduled for an admission assessment on Tuesday, Mar 03, 2015 at 8 AM.     Contact information:   [address] [place], [place]   [address]  [phone]     Follow-up recommendations: Activity:  As tolerated Diet: As recommended by your primary care doctor. Keep all scheduled follow-up appointments as recommended.    Comments: Take all your medications as prescribed by your mental healthcare provider. Report any adverse effects and or reactions from your  medicines to your outpatient provider promptly. Patient is instructed and cautioned to not engage in alcohol and or illegal drug use while on prescription medicines. In the event of worsening symptoms, patient is instructed to call the crisis hotline, 911 and or go to the nearest ED for appropriate evaluation and treatment of symptoms. Follow-up with your primary care provider for your other medical issues, concerns and or health care needs.   Total Discharge Time: Greater than 30 minutes  Signed: Sanjuana Kava, PMHNP, FNP-BC 02/27/2015, 3:10 PM   Patient seen, Suicide Assessment Completed.  Disposition Plan Reviewed

## 2015-02-27 NOTE — [facility] Suicide Risk Assessment (Signed)
[facility]   Demographic Factors:  34 year old single female, no children  Total Time spent with patient: 30 minutes  Musculoskeletal: Strength & Muscle Tone: within normal limits Gait & Station: normal Patient leans: N/A  Psychiatric Specialty Exam: Physical Exam  ROS  Blood pressure 96/70, pulse 98, temperature 97.6 F (36.4 C), temperature source Oral, resp. rate 18, height 5\' 8"  (1.727 m), weight 125 lb (56.7 kg), last menstrual period 02/16/2015.Body mass index is 19.01 kg/(m^2).  General Appearance: Well Groomed  Eye Contact::  Good  Speech:  Normal Rate409  Volume:  Normal  Mood:  euthymic   Affect:  Appropriate and fuller in range  Thought Process:  Goal Directed and Linear  Orientation:  Full (Time, Place, and Person)  Thought Content:  denies hallucinations, no delusions, not internally preoccupied   Suicidal Thoughts:  No- denies any thoughts of hurting self or anyone else   Homicidal Thoughts:  No  Memory:  recent and remote grossly intact   Judgement:  Other:  improved   Insight:  Present  Psychomotor Activity:  Normal  Concentration:  Good  Recall:  Good  Fund of Knowledge:Good  Language: Good  Akathisia:  Negative  Handed:  Right  AIMS (if indicated):     Assets:  Communication Skills Desire for Improvement Physical Health Resilience  Sleep:  Number of Hours: 6.75  Cognition: WNL  ADL's: improved    Have you used any form of tobacco in the last 30 days? (Cigarettes, Smokeless Tobacco, Cigars, and/or Pipes): Yes  Has this patient used any form of tobacco in the last 30 days? (Cigarettes, Smokeless Tobacco, Cigars, and/or Pipes) Yes, A prescription for an FDA-approved tobacco cessation medication was offered at discharge and the patient refused  Mental Status Per Nursing Assessment::   On Admission:  Suicidal ideation indicated by patient  Current Mental Status by Physician: At this time patient is improved compared to  admission- she is now euthymic, with a full range of affect , no thought disorder, no SI or HI, no psychotic symptoms , future oriented. Tolerating medications well at present.  Loss Factors: Psychosocial stressors, family strain, at least partially related to substance abuse .   Historical Factors: History of cocaine dependence , one suicide attempt as a teenager   Risk Reduction Factors:   Sense of responsibility to family and Positive coping skills or problem solving skills  Continued Clinical Symptoms:  As noted, at this time much improved compared to her admission status.   Cognitive Features That Contribute To Risk:  No gross cognitive deficits noted upon discharge. Is alert , attentive, and oriented x 3    Suicide Risk:  Mild:  Suicidal ideation of limited frequency, intensity, duration, and specificity.  There are no identifiable plans, no associated intent, mild dysphoria and related symptoms, good self-control (both objective and subjective assessment), few other risk factors, and identifiable protective factors, including available and accessible social support.  Principal Problem: Bipolar 1 disorder, depressed, moderate Discharge Diagnoses:  Patient Active Problem List   Diagnosis Date Noted  . BV (bacterial vaginosis) [N76.0, A49.9]   . Bipolar 1 disorder, depressed, moderate [F31.32] 02/20/2015  . 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 Cocaine dependence [F14.20] 02/20/2015  . Bipolar affective disorder, depressed, severe [F31.4] 02/19/2015  . Suicidal ideations [R45.851]   . Cocaine use disorder, severe, dependence [F14.20]   . Cannabis use disorder, moderate, dependence [F12.20]   . ANEMIA [D64.9] 05/11/2010  .  TENDINITIS, LEFT WRIST [M65.88] 05/10/2010  . CANDIDIASIS, VAGINAL [B37.3] 02/04/2009  . LOSS OF WEIGHT [R63.4] 03/07/2008  . ALLERGIC RHINITIS [J30.9] 01/18/2008  . INSOMNIA [G47.00] 01/18/2008  . ANXIETY [F41.1] 08/31/2007   . GERD [K21.9] 06/18/2007  . DYSPEPSIA [K30] 06/18/2007  . CONSTIPATION [K59.00] 06/18/2007  . VAGINITIS, BACTERIAL [N76.0] 06/18/2007  . TRICHOMONAL VAGINITIS [A59.01] 09/28/2006  . CHLAMYDIA TRACHOMATIS, GU SITE NOS [A56.2] 06/17/2005    Follow-up Information    [facility].   Why:  You are scheduled for an admission assessment on Tuesday, Mar 03, 2015 at 8 AM.     Contact information:   [address] [place], [place]   [address]  [phone]      Plan Of Care/Follow-up recommendations:  Activity:  as tolerated Diet:  Regular Tests:  NA Other:  see below   Is patient on multiple antipsychotic therapies at discharge:  No   Has Patient had three or more failed trials of antipsychotic monotherapy by history:  No  Recommended Plan for Multiple Antipsychotic Therapies: NA   Patient is leaving unit in good spirits. [place] ( [place]  Until [facility] , as above      Dharma Pare 02/27/2015, 11:47 AM

## 2015-02-27 NOTE — Plan of Care (Signed)
Problem: Alteration in mood Goal: STG-Pt Able to Identify Plan For Continuing Care at D/C Pt. Will be able to identify a plan for continuing care at discharge  Outcome: Progressing Client has plans for outpatient treatment, "[facility]'m [name]" "everything is good.

## 2015-02-27 NOTE — Tx Team (Signed)
Interdisciplinary Treatment Plan Update (Adult)  Date:  02/27/2015  Time Reviewed:  11:22 AM   Progress in Treatment: Attending groups: Patient is attending groups. Participating in groups:  Patient engages in discussion Taking medication as prescribed:  Patient is taking medications Tolerating medication:  Patient is tolerating medications Family/Significant othe contact made:   No, patient declined collateral contact Patient understands diagnosis:Yes, patient understands diagnosis and need for treatment Discussing patient identified problems/goals with staff:  Yes, patient is able to express goals/problems Medical problems stabilized or resolved:  Yes Denies suicidal/homicidal ideation: Yes, patient is denying SI/HI. Issues/concerns per patient self-inventory:   Other:  Discharge Plan or Barriers:  [facility]  Reason for Continuation of Hospitalization:  Comments:   Additional comments:  Patient and CSW reviewed Patient Discharge Process Letter/Patient Involvement Form.  Patient verbalized understanding and signed form.  Patient and CSW also reviewed and identified patient's goals and treatment plan.  Patient verbalized understanding and agreed to plan.  Estimated length of stay:  Discharge today  New goal(s):  Review of initial/current patient goals per problem list:     Attendees: Patient 02/27/2015 11:22 AM   Family:   02/27/2015 11:22 AM   Physician:  Nehemiah MassedFernando Cobos, MD 02/27/2015 11:22 AM   Nursing:    02/27/2015 11:22 AM   Clinical Social Worker:  Juline PatchQuylle Nayan Proch, LCSW 02/27/2015 11:22 AM   Clinical Social Worker:  Belenda CruiseKristin Drinkard, LCSW-A 02/27/2015 11:22 AM   Case Manager:  Onnie BoerJennifer Clark, RN 02/27/2015 11:22 AM   Other:  Waynetta SandyJan Wright, RN 02/27/2015 11:22 AM  Other:  Sherryl Mangeslivette Wesseh, RN 02/27/2015  11:22 AM   Other:  02/27/2015 11:22 AM   Other:  02/27/2015 11:22 AM   Other:  02/27/2015 11:22 AM   Other:   02/27/2015 11:22 AM   Other:    02/27/2015 11:22 AM   Other:  02/27/2015 11:22 AM   Other:   02/27/2015 11:22 AM    Scribe for Treatment Team:   [name], Carter Kaman Hairston, 02/27/2015   11:22 AM

## 2015-02-27 NOTE — Progress Notes (Signed)
  [facility] :   Will you be returning to the same living situation after discharge: Yes.  Patient discharging with family.  She is to [facility]. At discharge, do you have transportation home?: Yes,  Patient has arranged transportation home. Do you have the ability to pay for your medications: No.   Patient needs assistance with indigent medications   Release of information consent forms completed and in the chart;  Patient's signature needed at discharge.  Patient to Follow up at: Follow-up Information    [facility]/2015.   Why:  You are scheduled for an admission assessment on Tuesday, Mar 03, 2015 at 8 AM.     Contact information:   [address] [place], [place]   [address]  [phone]      Patient denies SI/HI: .Reviewed with all patients during discharge planning group   Safety Planning and Suicide Prevention discussed: .Reviewed with all patients during discharge planning group   Have you used any form of tobacco in the last 30 days? (Cigarettes, Smokeless Tobacco, Cigars, and/or Pipes): Yes  Has patient been referred to the Quitline?: Patient declined referral to Quitline.   Natalie Fischer, Natalie Fischer 02/27/2015, 11:23 AM

## 2015-02-27 NOTE — [facility] Group Notes (Signed)
[facility] Group Notes:  (Nursing/MHT/Case Management/Adjunct)  Date:  02/26/15  Time:  2030  Type of Therapy:  Psychoeducational Skills  Participation Level:  Active  Participation Quality:  Appropriate and Attentive  Affect:  Appropriate  Cognitive:  Alert and Appropriate  Insight:  Appropriate  Engagement in Group:  Engaged and Supportive  Modes of Intervention:  Discussion and Support  Summary of Progress/Problems: Patient reports that she is feeling much better and will probably be discharged in the morning. She [facility]. We talked about the importance of follow up appointments  Andres Egeritchett, Lalonnie Shaffer Hundley 02/27/2015, 12:06 AM

## 2015-02-27 NOTE — Progress Notes (Signed)
Pt d/c from the hospital with her sister. All items returned. D/C instructions given, prescriptions given and samples given. Pt denies si and hi.

## 2015-04-29 ENCOUNTER — Ambulatory Visit: Payer: Self-pay

## 2015-06-18 IMAGING — US US OB COMP LESS 14 WK
1 series · 13 of 28 positions shown · non-contrast
Comparison: None.

CLINICAL DATA: Vaginal bleeding. Estimated gestational age by LMP
is 6 weeks 1 day.

EXAM:
OBSTETRIC <14 WK ULTRASOUND
TECHNIQUE: Transabdominal ultrasound was performed for evaluation of the
gestation as well as the maternal uterus and adnexal regions.

[Series 1: us ob comp less 14 wks · 76 acquisitions, 13 frames shown]
[im 3/76]
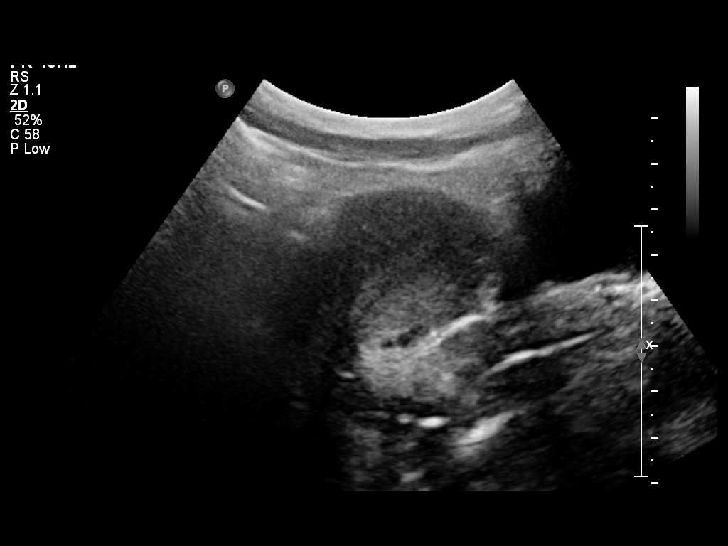
[im 9/76]
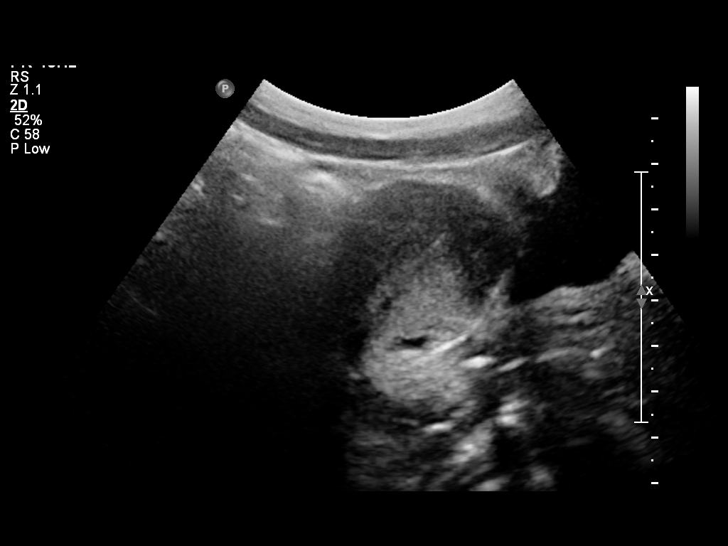
[im 14/76]
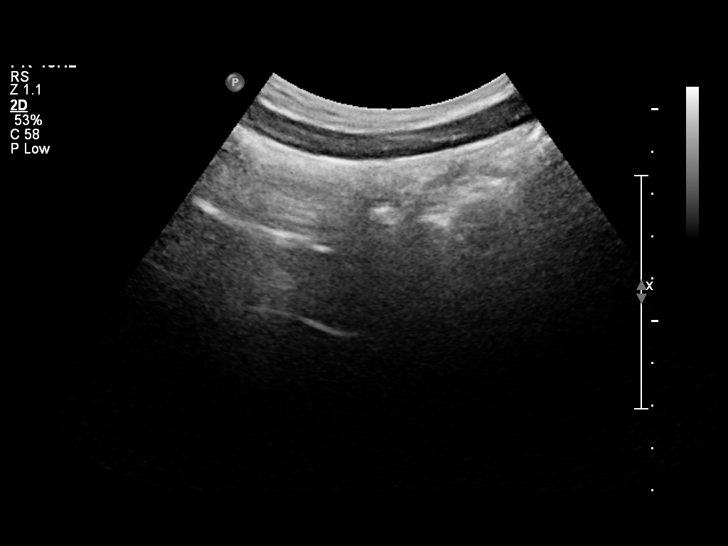
[im 20/76]
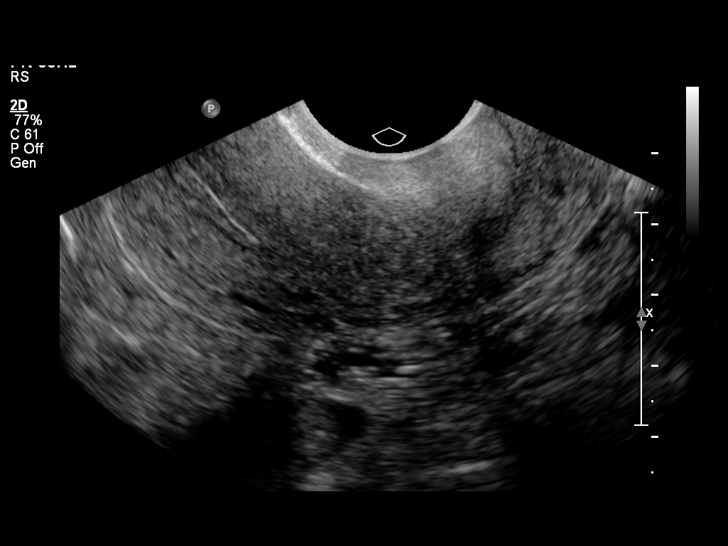
[im 26/76]
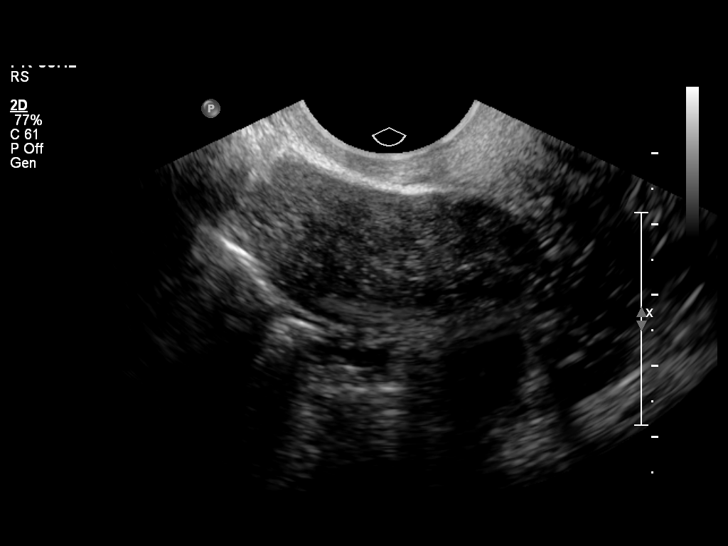
[im 31/76]
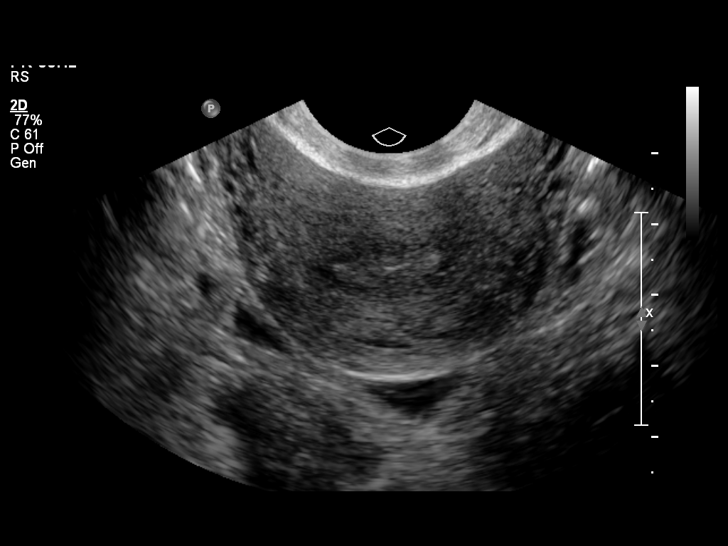
[im 39/76]
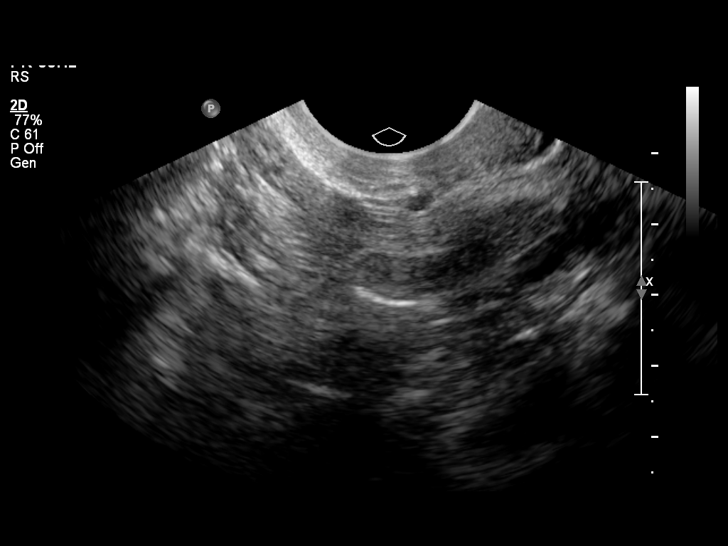
[im 45/76]
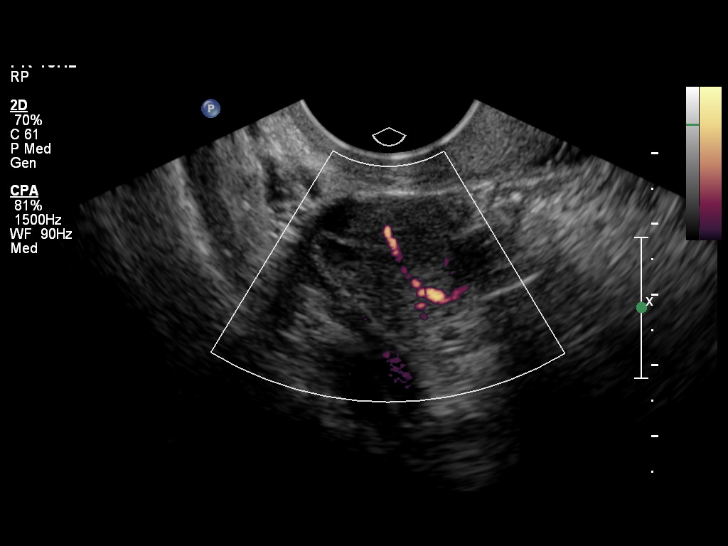
[im 51/76]
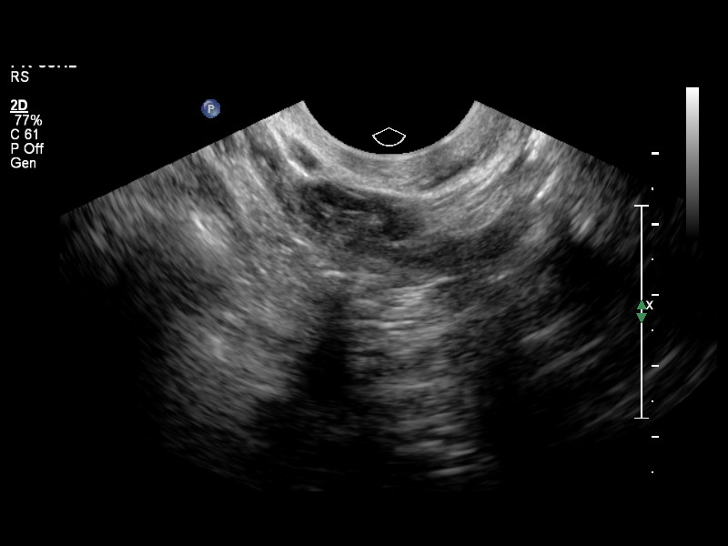
[im 56/76]
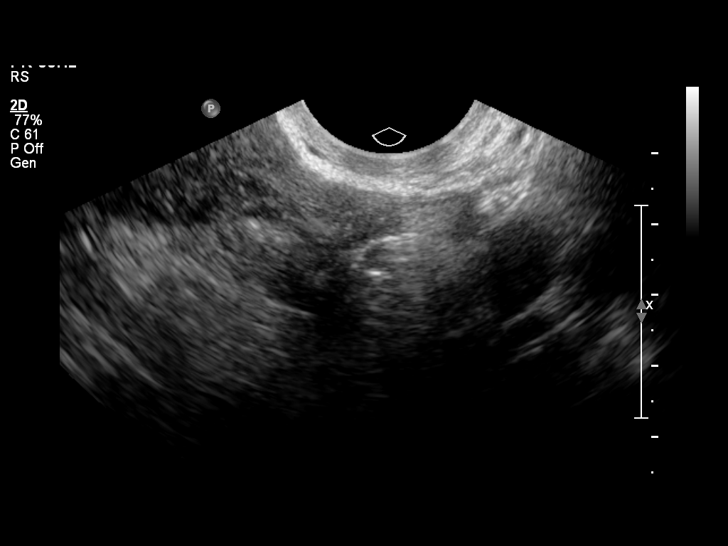
[im 62/76]
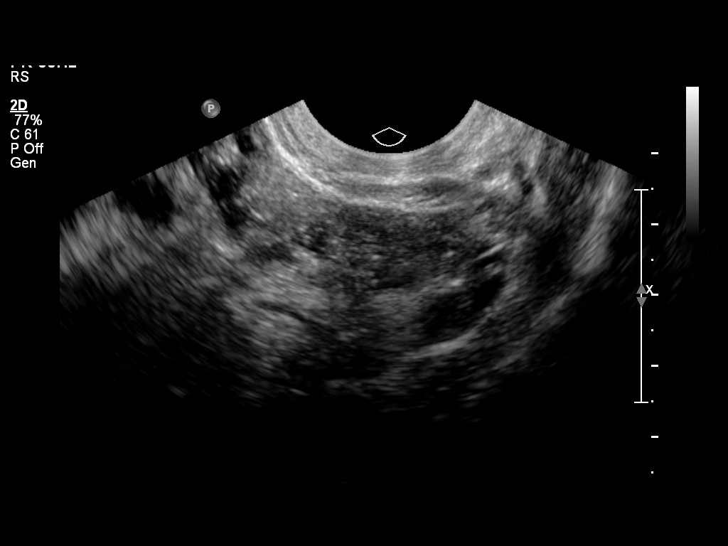
[im 67/76]
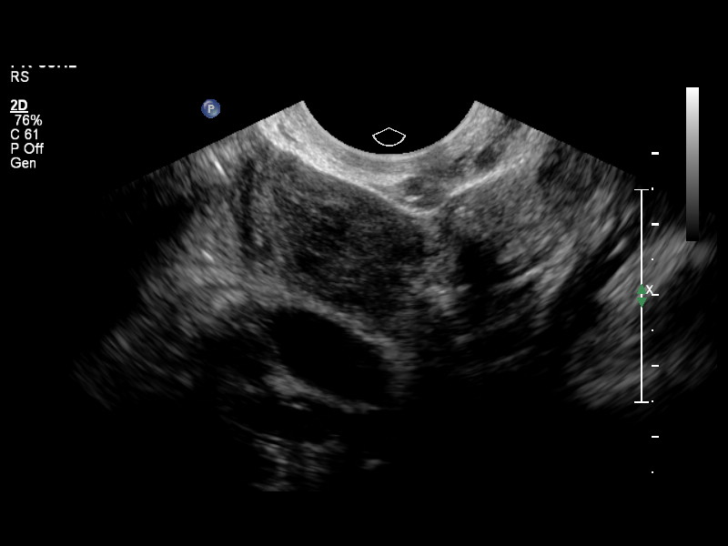
[im 73/76]
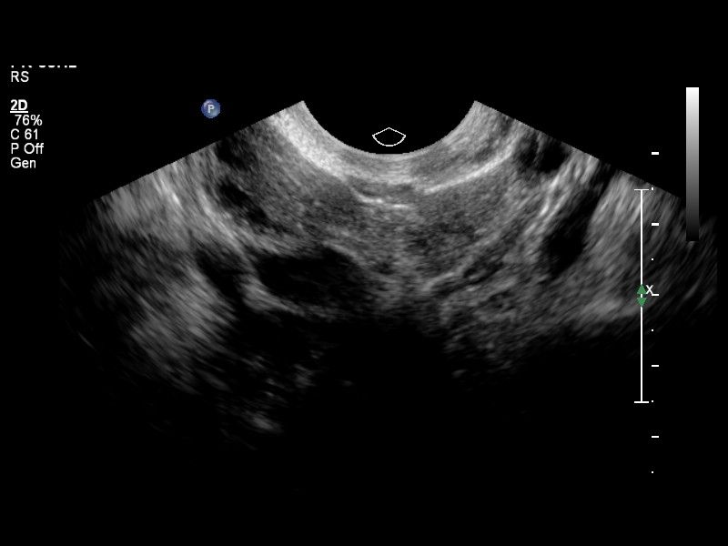

[13 of 28 positions shown; findings below may reference images not displayed]

FINDINGS: Intrauterine gestational sac: None visualized

Yolk sac:  None visualized

Embryo:  None visualized

Cardiac Activity:

Heart Rate:  bpm

Maternal uterus/adnexae: Right ovary appears normal. Probable corpus
luteum in the right ovary. No right adnexal mass is seen.

There is a heterogeneous, amorphous area in the left adnexa
immediately adjacent to the ovary which demonstrates vascular flow
on power Doppler flow and measures approximately 2.0 x 1.7 cm on
sagittal image number 74 of the 1st series of images. An ectopic
pregnancy cannot be excluded, but is not definite. Alternatively,
this could be a portion of the left ovary or left adnexal bowel
loops. The sonographer reports the patient does experience pain with
transvaginal imaging of the left adnexa.

No free fluid is identified.
IMPRESSION: 1. No intrauterine pregnancy is identified. Ectopic pregnancy cannot
be excluded. There is an amorphous heterogeneous area in the left
adnexa immediately adjacent to the ovary for which an ectopic
pregnancy cannot be excluded; alternatively, this could reflect
bowel or a portion of the ovary. Suggest correlation with serial
quantitative beta HCG levels. This finding was telephoned to Marteleur
Temhem at [DATE] 08/29/2013.

2. The patient experiences pain with transvaginal imaging of the
left adnexa region.

## 2015-06-22 IMAGING — US US OB TRANSVAGINAL
1 series · 13 of 28 positions shown · non-contrast
Comparison: 08/29/2013

CLINICAL DATA: Pelvic pain. Quantitative beta HCG today is [DATE].
On 08/31/2013, quantitative beta HCG was [DATE].

EXAM:
TRANSVAGINAL OB ULTRASOUND
TECHNIQUE: Transvaginal ultrasound was performed for complete evaluation of the
gestation as well as the maternal uterus, adnexal regions, and
pelvic cul-de-sac.

[Series 1: us ob transvaginal · 40 acquisitions, 13 frames shown]
[im 2/40]
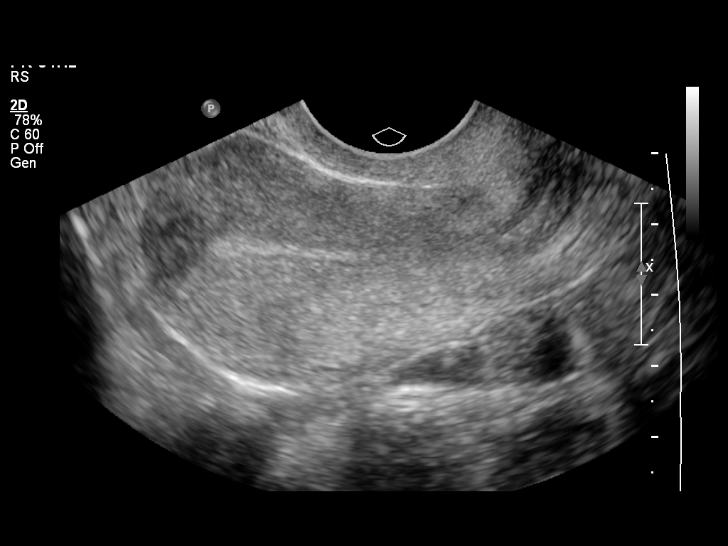
[im 5/40]
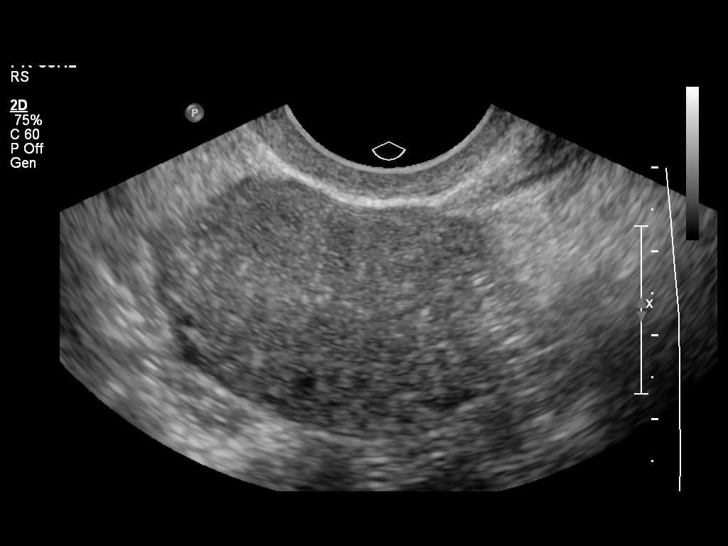
[im 8/40]
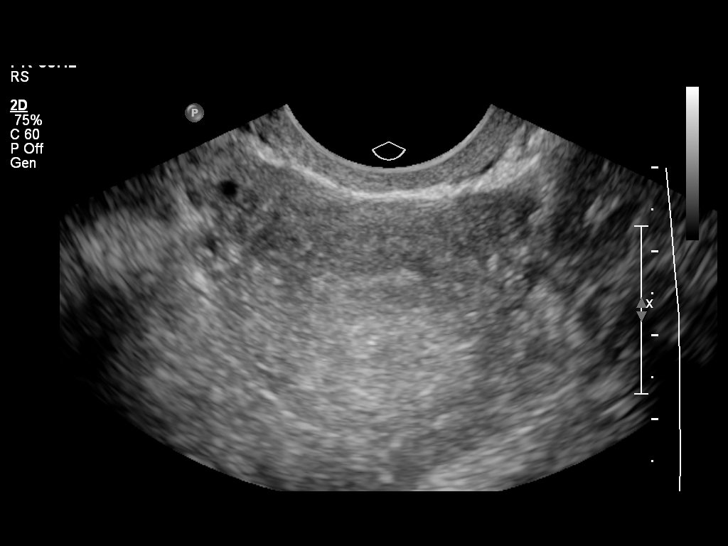
[im 11/40]
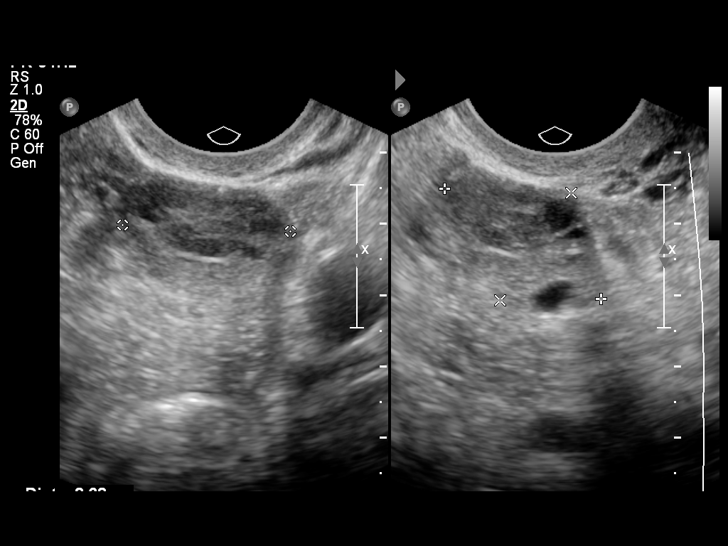
[im 14/40]
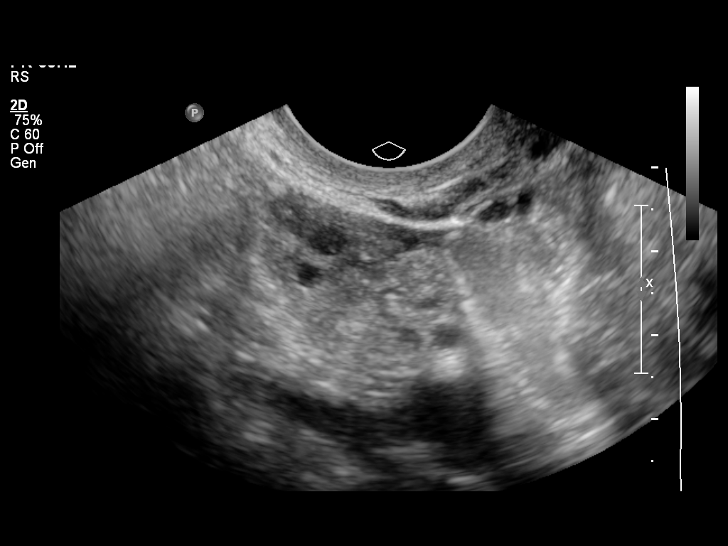
[im 16/40]
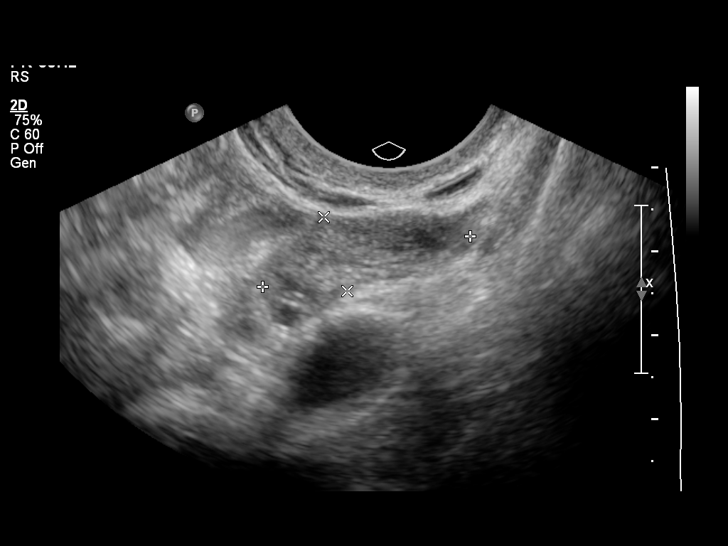
[im 21/40]
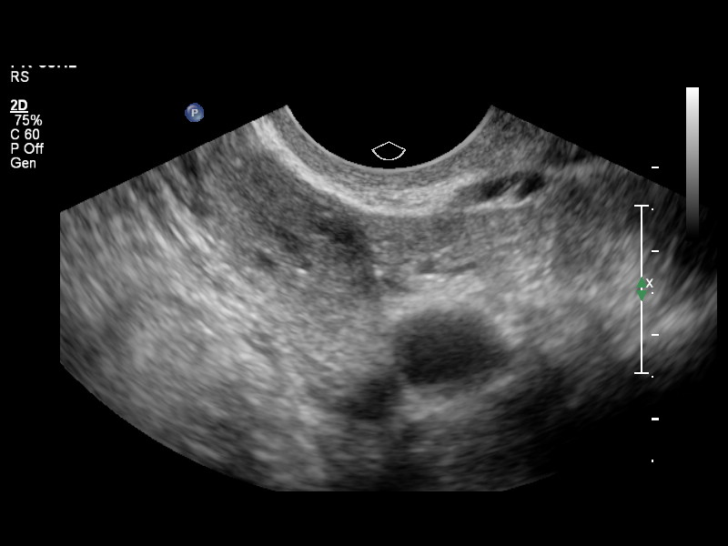
[im 24/40]
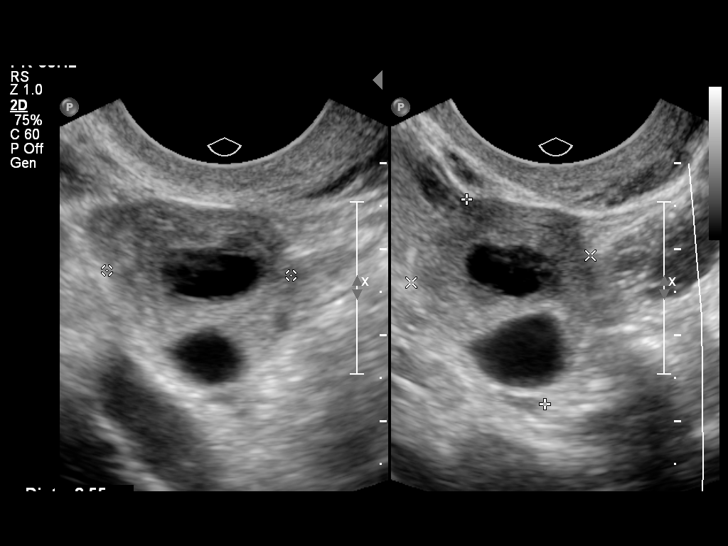
[im 27/40]
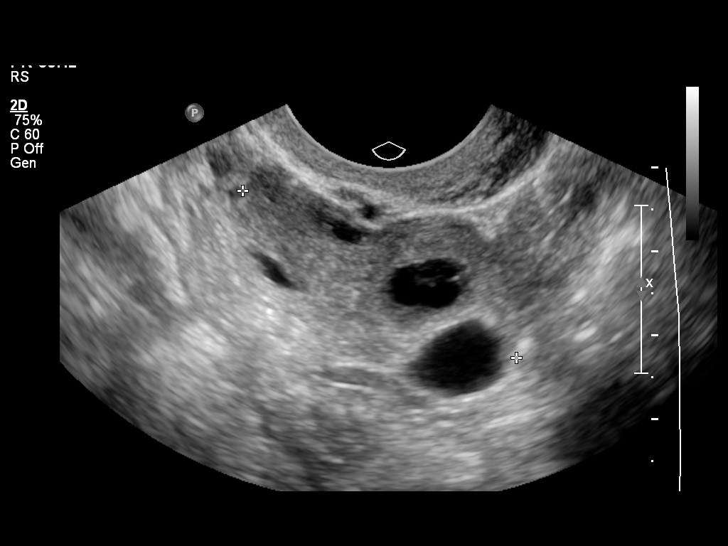
[im 29/40]
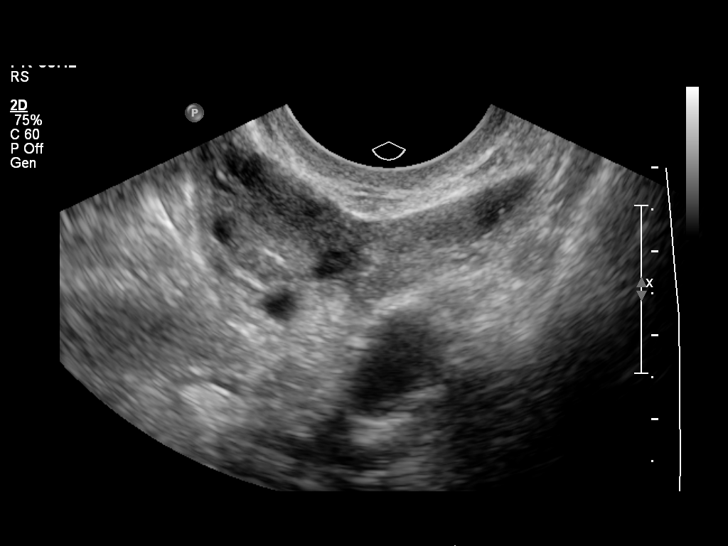
[im 32/40]
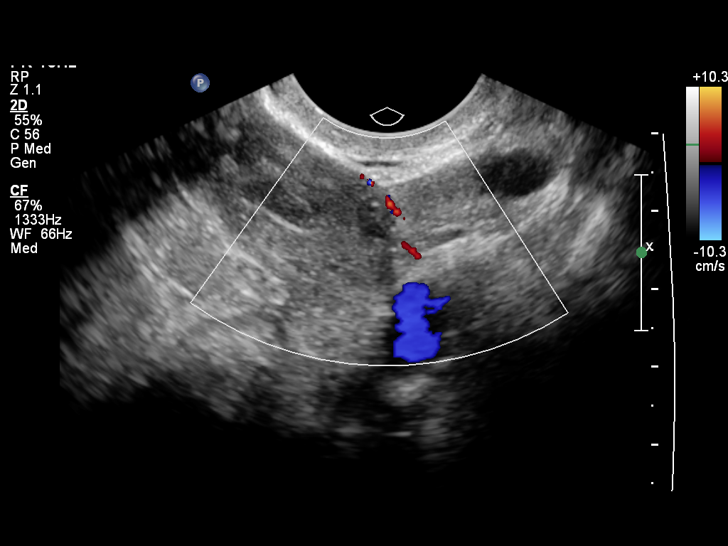
[im 35/40]
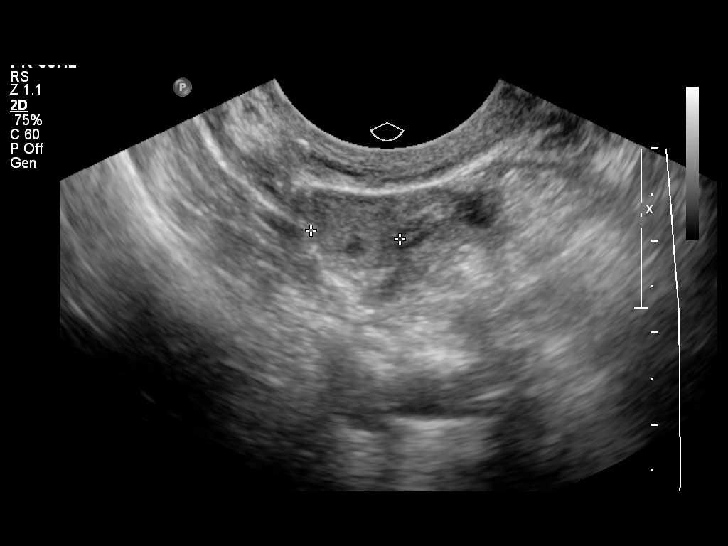
[im 38/40]
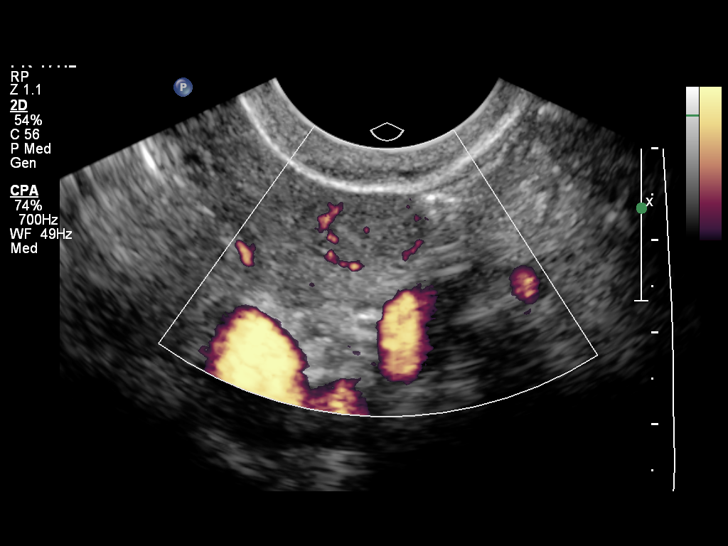

[13 of 28 positions shown; findings below may reference images not displayed]

FINDINGS: Intrauterine gestational sac: Not seen

Yolk sac:  Not seen

Embryo:  Not seen

Cardiac Activity: Not seen

Maternal uterus/adnexae: Within the right adnexal region, inferior
to the right ovary, there is a small soft tissue mass with ring-like
appearance. This measures 1.7 x 1.1 x 1.0 cm. Within the left
adnexa, there are several cystic structures, all which appear to be
related to the ovary on today's exam. No definite discrete mass
identified in the left adnexa. The patient is tender while scanning
of both adnexal regions.

Limited evaluation of the right adnexa was possible. At the request
of the patient's significant other, the exam was terminated because
of patient's pain.
IMPRESSION: 1. No evidence for intrauterine pregnancy. Given the quantitative
beta HCG values, findings are suspicious for ectopic pregnancy.
2. The most likely site of the ectopic pregnancy is favored to be
the right adnexal region.
3. The findings in the left adnexa are favored to represent intra
ovarian cysts on today's exam.
4. Critical Value/emergent results were called by telephone at the
time of interpretation on 09/02/2013 at [DATE] to KIRI JIM ,
who verbally acknowledged these results.

## 2015-07-02 IMAGING — US US OB TRANSVAGINAL
1 series · 13 of 28 positions shown · non-contrast
Comparison: 09/02/2013

CLINICAL DATA: post methotrexate for possible right ectopic
pregnancy.

EXAM:
TRANSVAGINAL OB ULTRASOUND
TECHNIQUE: Transvaginal ultrasound was performed for complete evaluation of the
gestation as well as the maternal uterus, adnexal regions, and
pelvic cul-de-sac.

[Series 1: us ob transvaginal · 36 acquisitions, 13 frames shown]
[im 2/36]
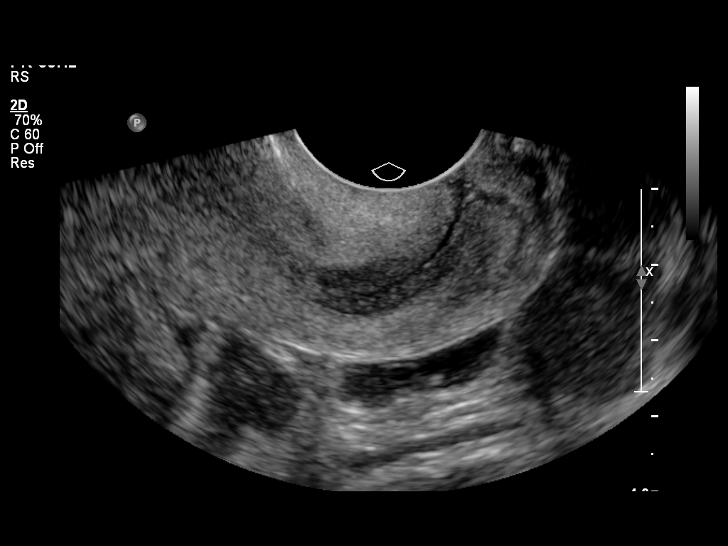
[im 4/36]
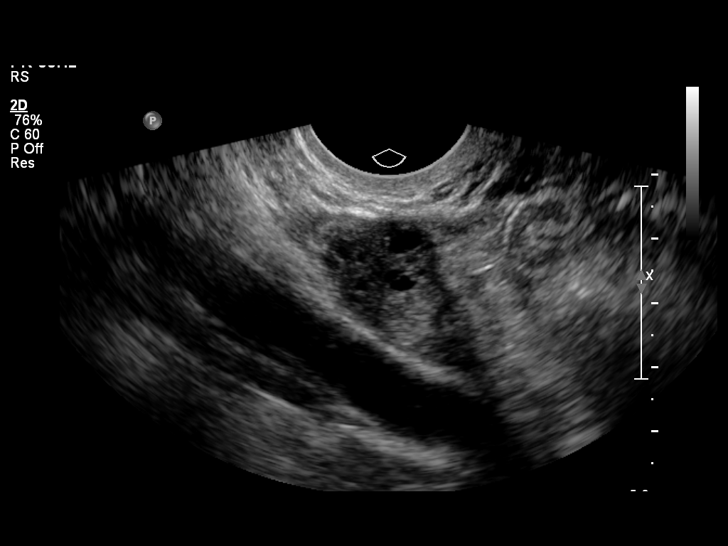
[im 7/36]
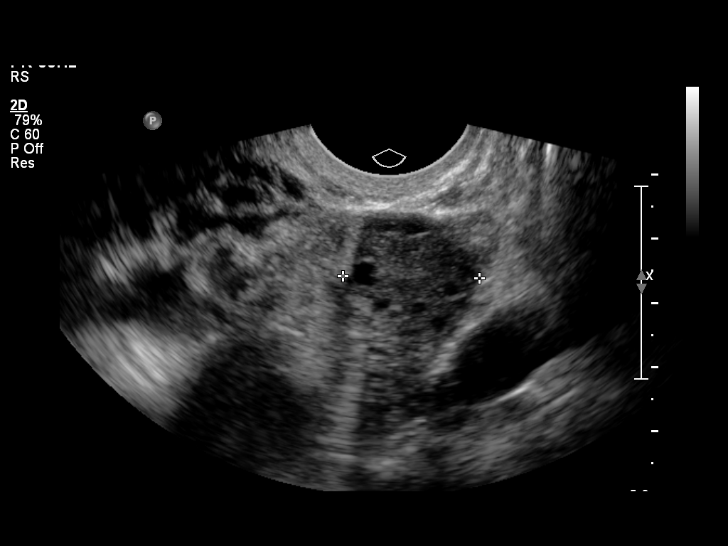
[im 10/36]
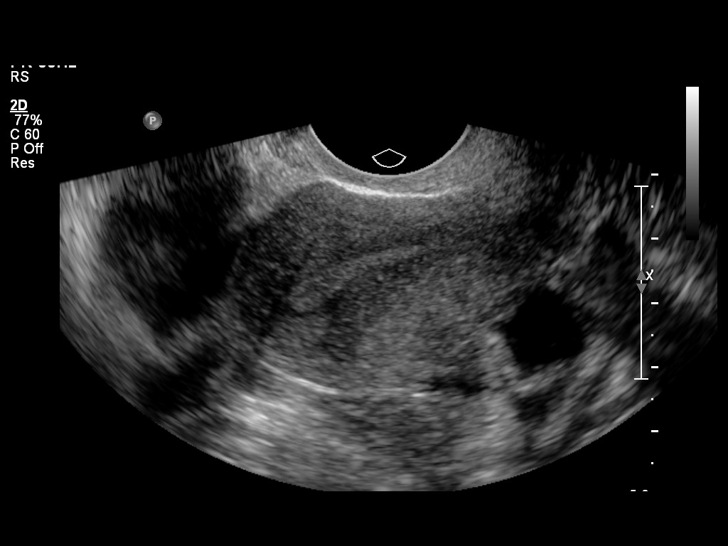
[im 12/36]
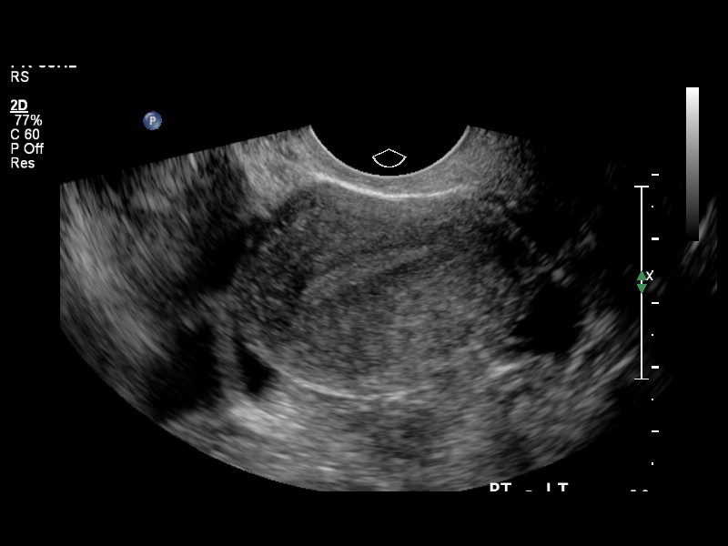
[im 15/36]
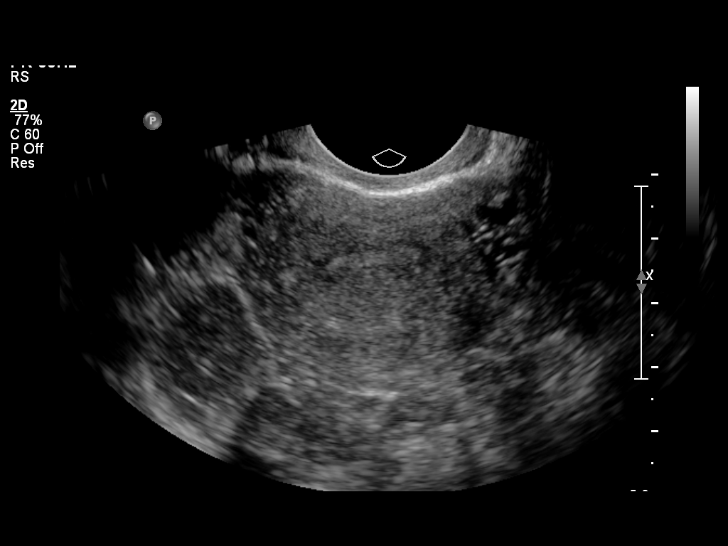
[im 19/36]
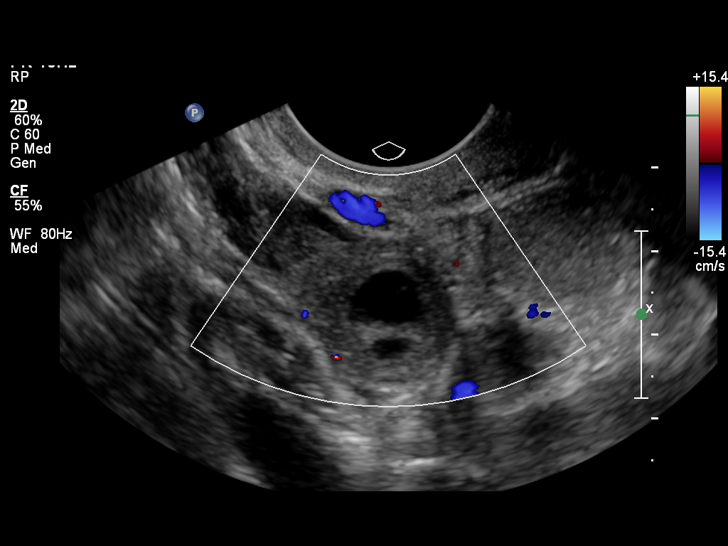
[im 21/36]
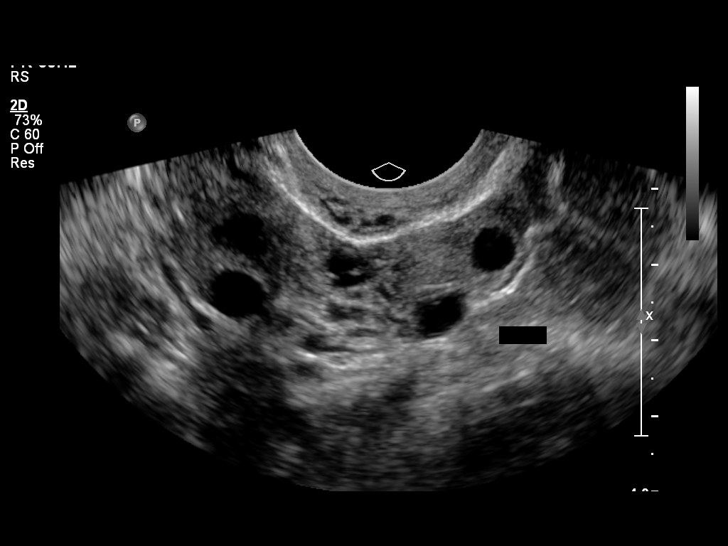
[im 24/36]
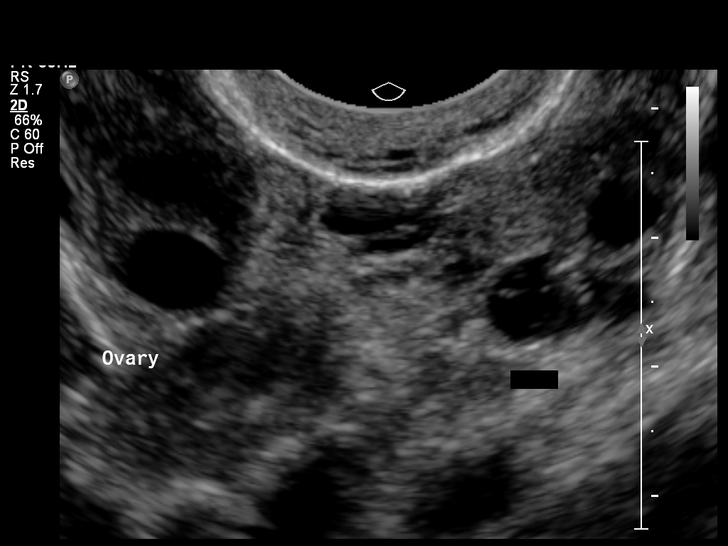
[im 26/36]
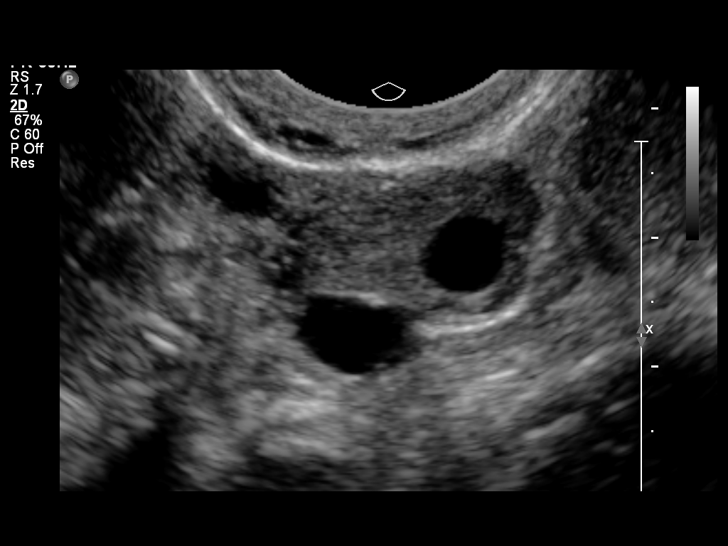
[im 29/36]
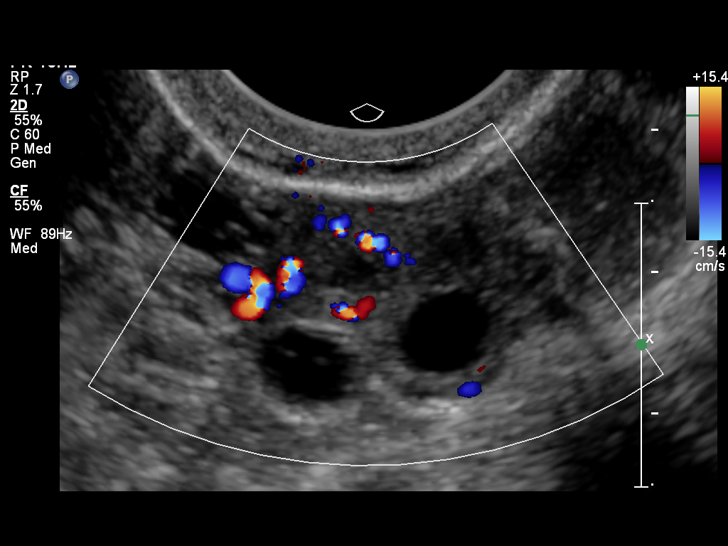
[im 32/36]
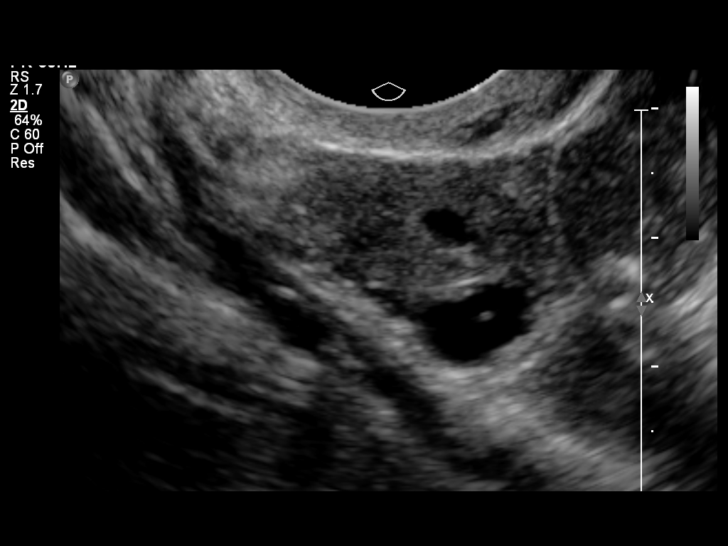
[im 34/36]
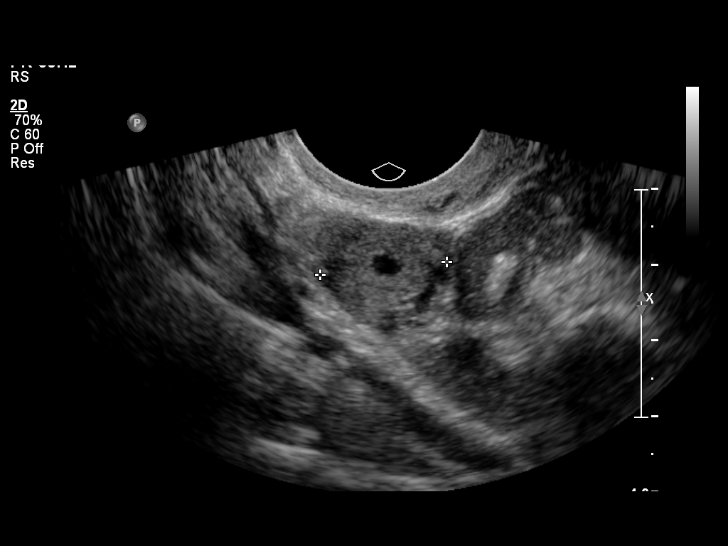

[13 of 28 positions shown; findings below may reference images not displayed]

FINDINGS: Intrauterine gestational sac: None

Yolk sac:  Absent

Embryo:  Absent

Cardiac Activity: Absent

Heart Rate:  bpm

MSD:   mm    w     d

CRL:     mm    w  d                  US EDC:

Maternal uterus/adnexae: Within the right adnexa there is a mass
noted measuring 2.2 x 1.7 x 1.3 cm. This has an appearance of an
ectopic pregnancy with an echogenic rim and central cystic area.
This is slightly larger ba compared with nd prior study when this
previously measured maximally 1.7 cm. No fetal pole is visualized.
No free fluid. Left adnexa unremarkable.
IMPRESSION: No intrauterine pregnancy.

Again noted is the mass in the right adnexa inferior to the right
ovary concerning for ectopic pregnancy, slightly larger than prior
study, measuring maximally 2.2 cm. No fetal pole or heartbeat seen.

Critical Value/emergent results were called by telephone at the time
of interpretation on 09/12/2013 at [DATE] to Gabi in the [facility], who
verbally acknowledged these results.

## 2015-08-16 ENCOUNTER — Inpatient Hospital Stay (HOSPITAL_COMMUNITY)
Admission: AD | Admit: 2015-08-16 | Discharge: 2015-08-16 | Disposition: A | Discharge status: Home / Self Care | Payer: Self-pay | Source: Ambulatory Visit | Attending: Obstetrics and Gynecology | Admitting: Obstetrics and Gynecology

## 2015-08-16 ENCOUNTER — Encounter (HOSPITAL_COMMUNITY): Payer: Self-pay | Admitting: *Deleted

## 2015-08-16 DIAGNOSIS — A499 Bacterial infection, unspecified: Secondary | ICD-10-CM

## 2015-08-16 DIAGNOSIS — Z87891 Personal history of nicotine dependence: Secondary | ICD-10-CM | POA: Insufficient documentation

## 2015-08-16 DIAGNOSIS — N898 Other specified noninflammatory disorders of vagina: Secondary | ICD-10-CM

## 2015-08-16 DIAGNOSIS — N760 Acute vaginitis: Secondary | ICD-10-CM | POA: Insufficient documentation

## 2015-08-16 DIAGNOSIS — B9689 Other specified bacterial agents as the cause of diseases classified elsewhere: Secondary | ICD-10-CM | POA: Insufficient documentation

## 2015-08-16 LAB — URINALYSIS, ROUTINE W REFLEX MICROSCOPIC
Bilirubin Urine: NEGATIVE
Glucose, UA: NEGATIVE mg/dL
Hgb urine dipstick: NEGATIVE
KETONES UR: 15 mg/dL — AB
LEUKOCYTES UA: NEGATIVE
NITRITE: NEGATIVE
Protein, ur: NEGATIVE mg/dL
Specific Gravity, Urine: >1.03 — ABNORMAL HIGH (ref 1.005–1.030)
Urobilinogen, UA: 0.2 mg/dL (ref 0.0–1.0)
pH: 5.5 (ref 5.0–8.0)

## 2015-08-16 LAB — WET PREP, GENITAL
Trich, Wet Prep: NONE SEEN
Yeast Wet Prep HPF POC: NONE SEEN

## 2015-08-16 LAB — POCT PREGNANCY, URINE: Preg Test, Ur: NEGATIVE

## 2015-08-16 MED ORDER — FLUCONAZOLE 150 MG PO TABS: ORAL_TABLET | ORAL | Status: DC

## 2015-08-16 MED ORDER — METRONIDAZOLE 500 MG PO TABS: 500.0000 mg | ORAL_TABLET | Freq: Two times a day (BID) | ORAL | Status: DC

## 2015-08-16 NOTE — Progress Notes (Signed)
Wet prep & GC/Chlamydia cultures obtained.

## 2015-08-16 NOTE — Discharge Instructions (Signed)

## 2015-08-16 NOTE — MAU Note (Signed)
Pt presents with c/o vaginal itching/irritation with foul intermittent foul smelling discharge for approx 1 week.  States recent unprotected sex, wants to r/o STD's.  Pt also reports recent change in laundry detergent.

## 2015-08-16 NOTE — MAU Provider Note (Signed)
Chief Complaint: Vaginal Itching   First Provider Initiated Contact with Patient 08/16/15 1617      SUBJECTIVE HPI: Natalie Fischer is a 34 y.o. G2P0020 who presents to maternity admissions reporting vaginal itching/irritation x 3-4 days.  She desires STD testing today also. She reports onset of vaginal itching with thin clear discharge with occasional odor about a week ago with itching starting 3-4 days ago.  She reports she recently switched laundry detergents and thinks this may be causing itching.  She denies vaginal bleeding, urinary symptoms, h/a, dizziness, n/v, or fever/chills.     Vaginal Discharge The patient's primary symptoms include vaginal discharge. The patient's pertinent negatives include no pelvic pain or vaginal bleeding. This is a new problem. The current episode started in the past 7 days. The problem occurs intermittently. The problem has been unchanged. The patient is experiencing no pain. She is not pregnant. Associated symptoms include dysuria. Pertinent negatives include no abdominal pain, chills, constipation, diarrhea, fever, flank pain, frequency, headaches, nausea, urgency or vomiting. The vaginal discharge was thin and clear. There has been no bleeding. Nothing aggravates the symptoms. She has tried nothing for the symptoms. She is sexually active. It is unknown whether or not her partner has an STD. Her menstrual history has been regular. Her past medical history is significant for an STD.    Past Medical History  Diagnosis Date  . GERD (gastroesophageal reflux disease)   . Ectopic pregnancy 2008 and 2014  . Bipolar 1 disorder (HCC)   . UTI (lower urinary tract infection)   . Trichomonas vaginitis   . Chlamydia   . Anxiety   . Insomnia   . Constipation    Past Surgical History  Procedure Laterality Date  . Ectopic pregnancy surgery     Social History   Social History  . Marital Status: Single    Spouse Name: N/A  . Number of Children: N/A  . Years  of Education: N/A   Occupational History  . Not on file.   Social History Main Topics  . Smoking status: Former Smoker -- 0.25 packs/day    Types: Cigarettes    Quit date: 04/12/2014  . Smokeless tobacco: Never Used  . Alcohol Use: No     Comment: social  . Drug Use: No     Comment: Former use  . Sexual Activity: Yes    Birth Control/ Protection: Condom   Other Topics Concern  . Not on file   Social History Narrative   No current facility-administered medications on file prior to encounter.   Current Outpatient Prescriptions on File Prior to Encounter  Medication Sig Dispense Refill  . hydrOXYzine (ATARAX/VISTARIL) 25 MG tablet Take 1 tablet (25 mg total) by mouth every 6 (six) hours as needed for anxiety (sleep). (Patient taking differently: Take 25 mg by mouth every 6 (six) hours as needed for anxiety (and/or sleep). ) 45 tablet 0  . ibuprofen (ADVIL,MOTRIN) 200 MG tablet Take 4 tablets (800 mg total) by mouth every 8 (eight) hours as needed for moderate pain. 30 tablet 0   No Known Allergies  ROS:  Review of Systems  Constitutional: Negative for fever, chills and fatigue.  HENT: Negative for sinus pressure.   Eyes: Negative for photophobia.  Respiratory: Negative for shortness of breath.   Cardiovascular: Negative for chest pain.  Gastrointestinal: Negative for nausea, vomiting, abdominal pain, diarrhea and constipation.  Genitourinary: Positive for dysuria and vaginal discharge. Negative for urgency, frequency, flank pain, vaginal bleeding, difficulty urinating,  vaginal pain and pelvic pain.  Musculoskeletal: Negative for neck pain.  Neurological: Negative for dizziness, weakness and headaches.  Psychiatric/Behavioral: Negative.      I have reviewed patient's Past Medical Hx, Surgical Hx, Family Hx, Social Hx, medications and allergies.   Physical Exam   Patient Vitals for the past 24 hrs:  BP Temp Temp src Pulse Resp Height Weight  08/16/15 1658 118/82 mmHg  98.2 F (36.8 C) Oral 66 20 - -  08/16/15 1543 111/80 mmHg 98 F (36.7 C) Oral 79 20 5\' 11"  (1.803 m) 55.339 kg (122 lb)   Constitutional: Well-developed, well-nourished female in no acute distress.  Cardiovascular: normal rate Respiratory: normal effort GI: Abd soft, non-tender. Pos BS x 4 MS: Extremities nontender, no edema, normal ROM Neurologic: Alert and oriented x 4.  GU: Neg CVAT.  PELVIC EXAM: Cervix pink, visually closed, without lesion, small amount yellow thin discharge with mild odor, vaginal walls and external genitalia normal Bimanual exam: Cervix 0/long/high, firm, anterior, neg CMT, uterus nontender, nonenlarged, adnexa without tenderness, enlargement, or mass   LAB RESULTS Results for orders placed or performed during the hospital encounter of 08/16/15 (from the past 24 hour(s))  Urinalysis, Routine w reflex microscopic (not at [facility])     Status: Abnormal   Collection Time: 08/16/15  3:35 PM  Result Value Ref Range   Color, Urine YELLOW YELLOW   APPearance CLEAR CLEAR   Specific Gravity, Urine >1.030 (H) 1.005 - 1.030   pH 5.5 5.0 - 8.0   Glucose, UA NEGATIVE NEGATIVE mg/dL   Hgb urine dipstick NEGATIVE NEGATIVE   Bilirubin Urine NEGATIVE NEGATIVE   Ketones, ur 15 (A) NEGATIVE mg/dL   Protein, ur NEGATIVE NEGATIVE mg/dL   Urobilinogen, UA 0.2 0.0 - 1.0 mg/dL   Nitrite NEGATIVE NEGATIVE   Leukocytes, UA NEGATIVE NEGATIVE  Pregnancy, urine POC     Status: None   Collection Time: 08/16/15  3:45 PM  Result Value Ref Range   Preg Test, Ur NEGATIVE NEGATIVE  Wet prep, genital     Status: Abnormal   Collection Time: 08/16/15  4:15 PM  Result Value Ref Range   Yeast Wet Prep HPF POC NONE SEEN NONE SEEN   Trich, Wet Prep NONE SEEN NONE SEEN   Clue Cells Wet Prep HPF POC MODERATE (A) NONE SEEN   WBC, Wet Prep HPF POC FEW (A) NONE SEEN       IMAGING No results found.  MAU Management/MDM: Ordered labs and reviewed results.  GCC, RPR,  HIV, Hep C pending.  Pt  stable at time of discharge.  ASSESSMENT 1. Vaginal discharge   2. Bacterial vaginosis     PLAN Discharge home Flagyl 500 mg BID x 7 days Diflucan 150 mg x 2 tabs at pt request GCC, RPR, HIV, Hep C pending   Follow-up Information    Schedule an appointment as soon as possible [facility]   Specialty:  Obstetrics and Gynecology   Why:  Return to MAU as needed for emergencies   Contact information:   [address] [place] [id number] [phone]      Sharen CounterLisa Leftwich-Kirby Certified Nurse-Midwife 08/16/2015  5:21 PM

## 2015-08-17 LAB — HCV COMMENT:

## 2015-08-17 LAB — GC/CHLAMYDIA PROBE AMP (~~LOC~~) NOT AT ARMC
Chlamydia: NEGATIVE
Neisseria Gonorrhea: NEGATIVE

## 2015-08-17 LAB — HEPATITIS C ANTIBODY (REFLEX)

## 2015-08-17 LAB — SYPHILIS: RPR W/REFLEX TO RPR TITER AND TREPONEMAL ANTIBODIES, TRADITIONAL SCREENING AND DIAGNOSIS ALGORITHM: RPR Ser Ql: NONREACTIVE

## 2015-08-17 LAB — HIV ANTIBODY (ROUTINE TESTING W REFLEX): HIV SCREEN 4TH GENERATION: NONREACTIVE

## 2015-10-02 ENCOUNTER — Ambulatory Visit: Payer: Self-pay | Admitting: Internal Medicine

## 2015-10-29 IMAGING — US US TRANSVAGINAL NON-OB
1 series · 14 of 25 positions shown · non-contrast
Comparison: 09/12/2013

CLINICAL DATA: Right lower quadrant pain. Recent ectopic pregnancy.
Rule out ovarian cyst.



[Series 1: us pelvis complete · 14 of 65 slices shown]
[im 1/65]
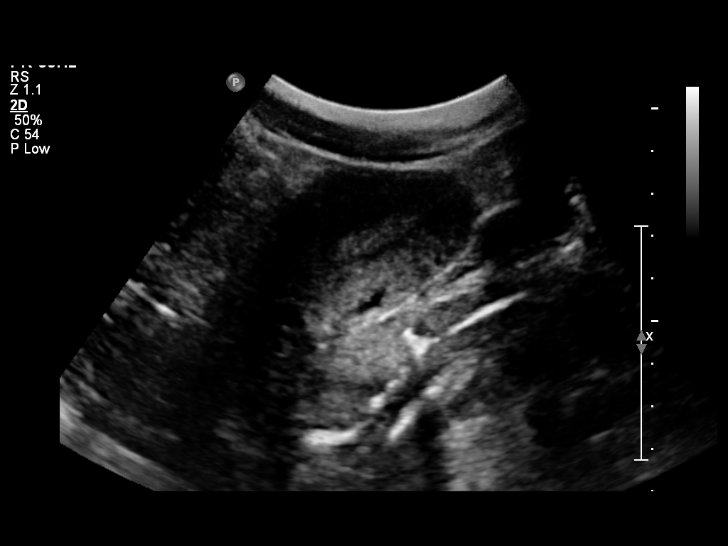
[im 6/65]
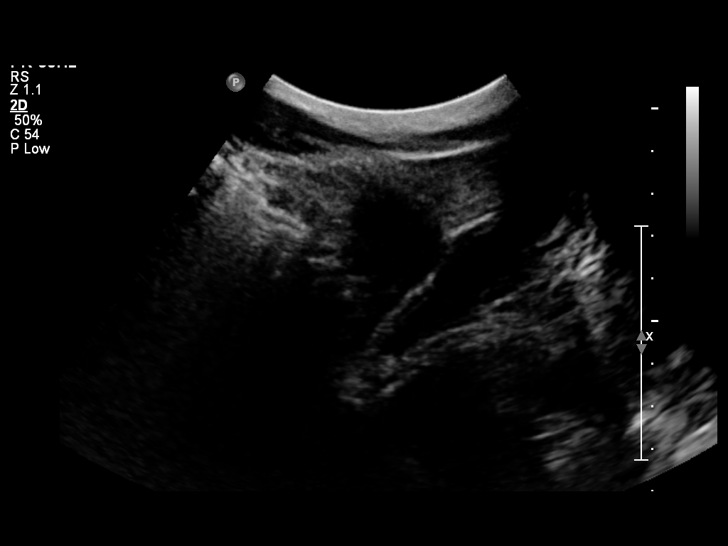
[im 11/65]
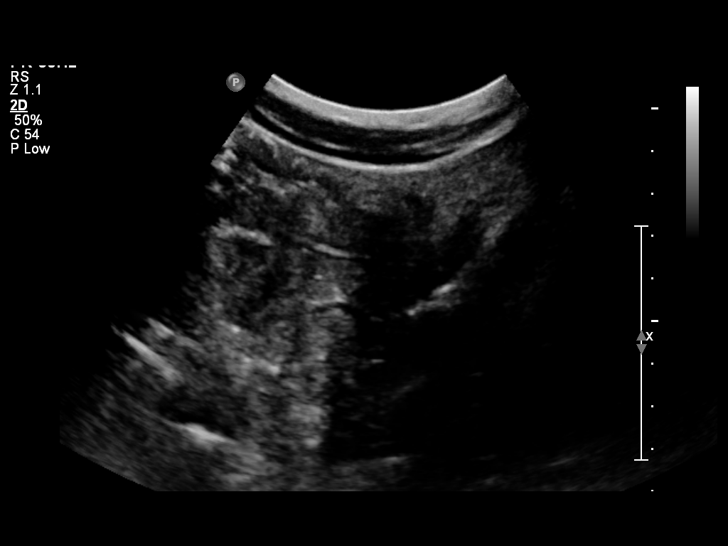
[im 17/65]
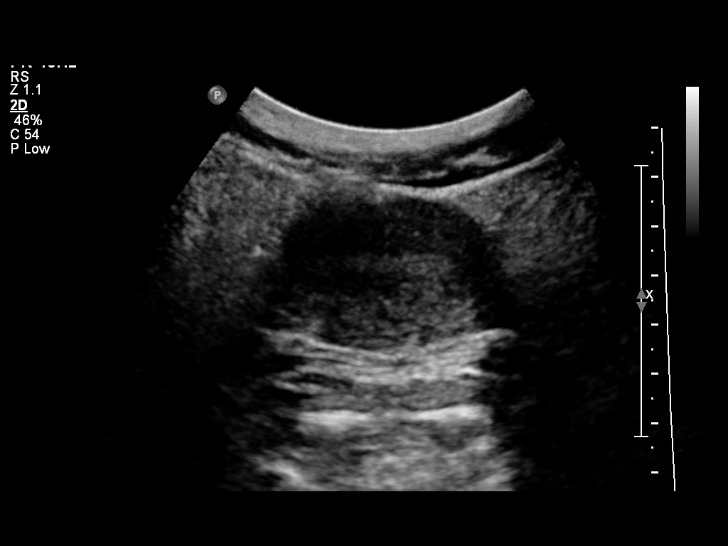
[im 22/65]
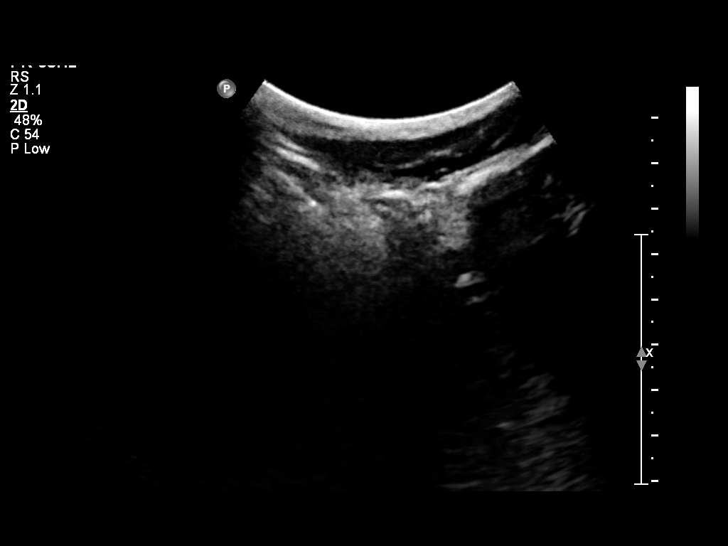
[im 25/65]
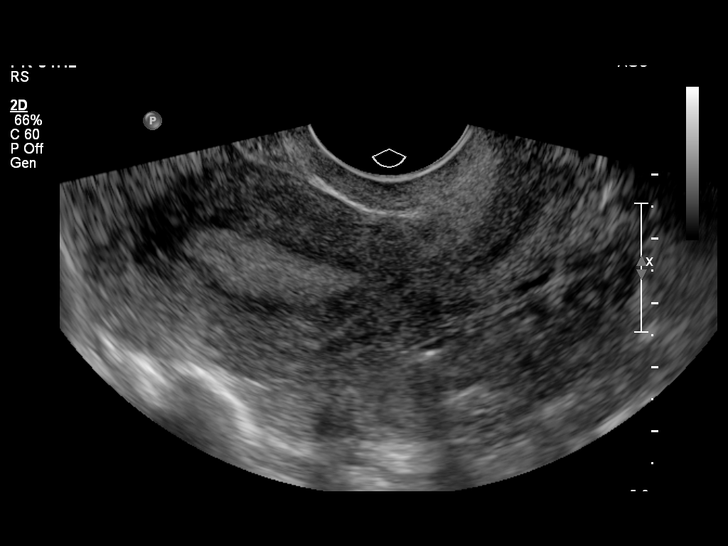
[im 30/65]
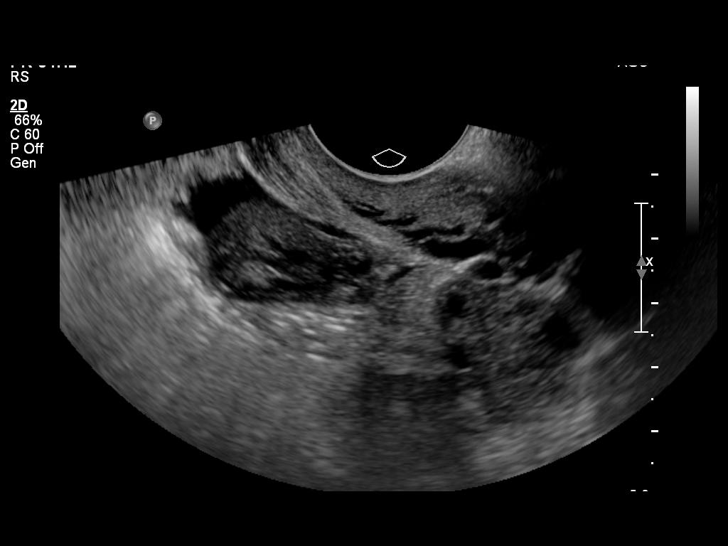
[im 35/65]
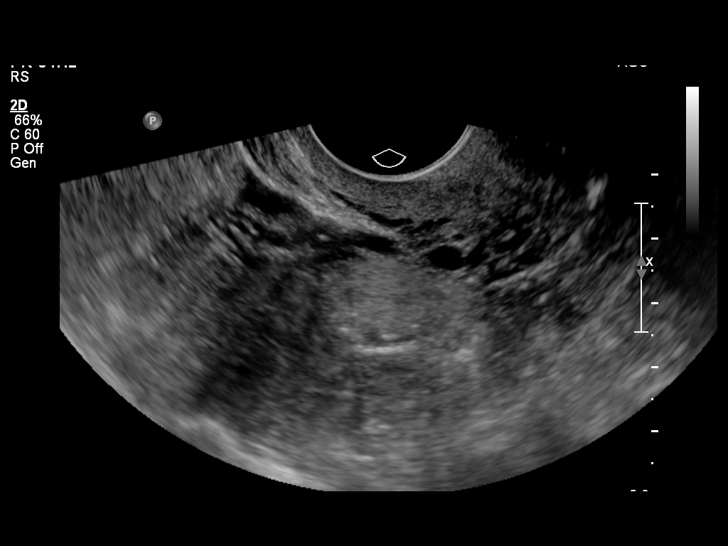
[im 41/65]
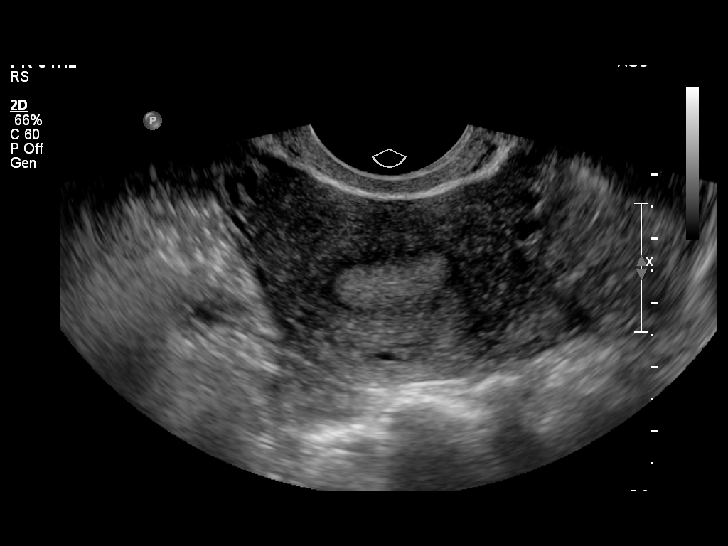
[im 43/65]
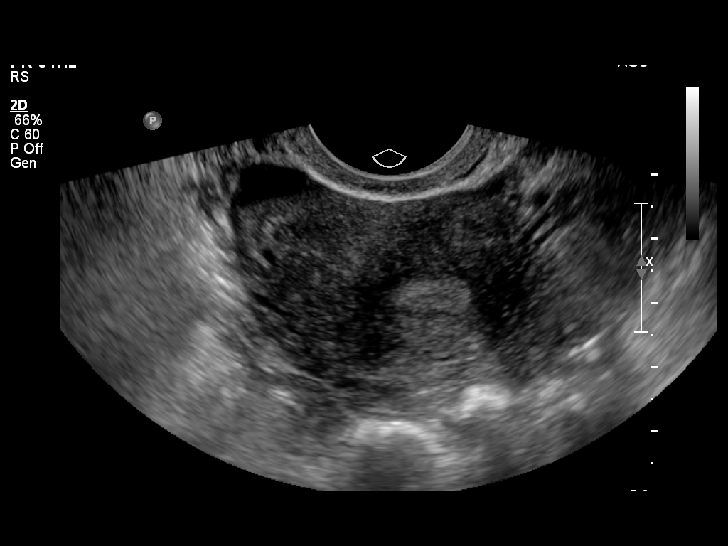
[im 49/65]
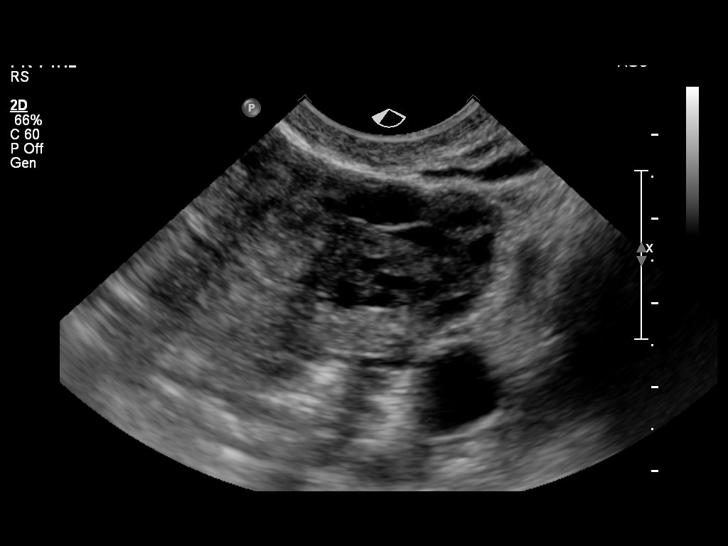
[im 54/65]
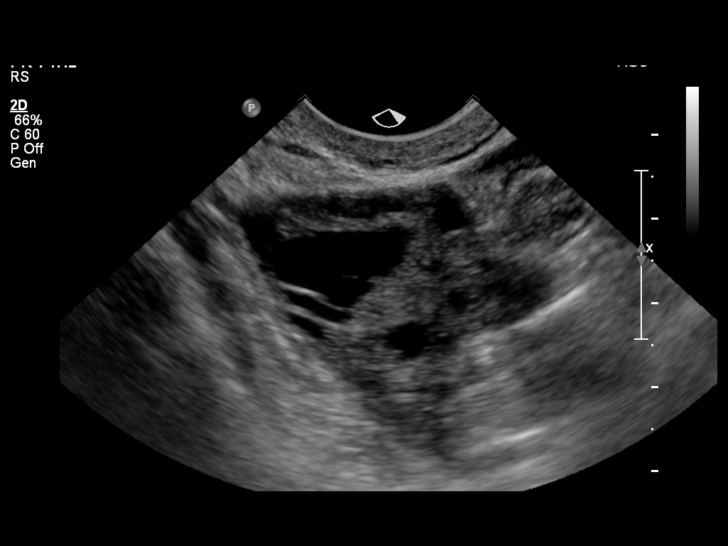
[im 59/65]
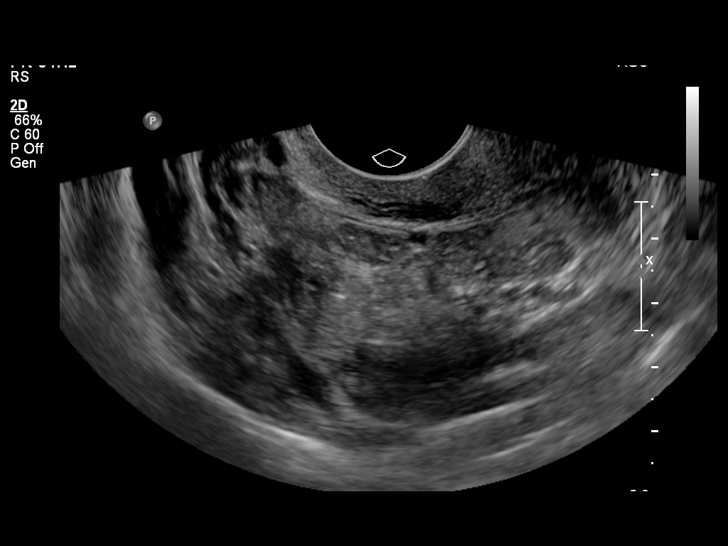
[im 65/65]
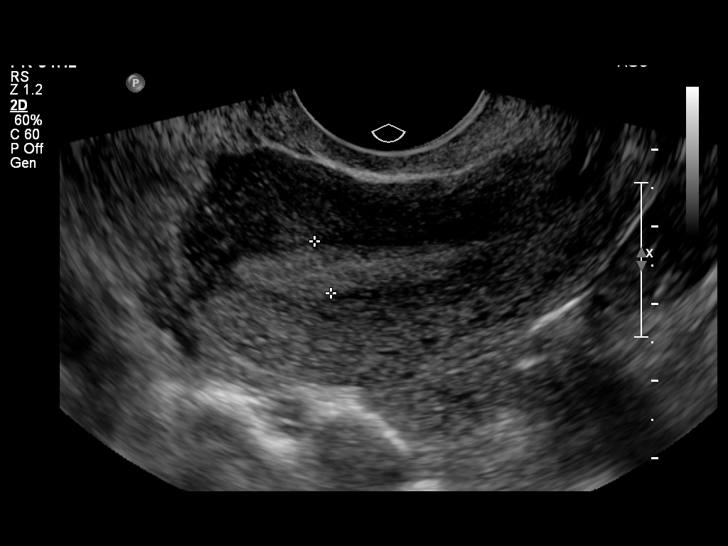

[14 of 25 positions shown; findings below may reference images not displayed]

FINDINGS: Uterus

Measurements: 7 x 3 x 5 cm. No fibroids or other mass visualized.

Endometrium

Thickness: 7 mm.  No focal abnormality visualized.

Right ovary

Measurements: 3.1 x 2.2 x 2.5 cm. Thinly septated cyst or closely
neighboring follicles in total measuring up to 2 cm. No residual
right adnexal mass is seen.

Left ovary

Measurements: 3.4 x 2.1 x 2 cm. Normal appearance/no adnexal mass.

Other findings

No free fluid.
IMPRESSION: 1. No acute sonographic abnormality.
2. Resolved right adnexal mass/ectopic.

## 2015-11-11 ENCOUNTER — Ambulatory Visit: Payer: Self-pay | Admitting: Internal Medicine

## 2015-12-18 ENCOUNTER — Encounter: Payer: Self-pay | Admitting: Internal Medicine

## 2015-12-18 ENCOUNTER — Ambulatory Visit (INDEPENDENT_AMBULATORY_CARE_PROVIDER_SITE_OTHER): Payer: Self-pay | Admitting: Internal Medicine

## 2015-12-18 VITALS — BP 120/74 | HR 88 | Ht 70.0 in | Wt 117.5 lb

## 2015-12-18 DIAGNOSIS — A64 Unspecified sexually transmitted disease: Secondary | ICD-10-CM

## 2015-12-18 DIAGNOSIS — K219 Gastro-esophageal reflux disease without esophagitis: Secondary | ICD-10-CM

## 2015-12-18 DIAGNOSIS — K029 Dental caries, unspecified: Secondary | ICD-10-CM

## 2015-12-18 DIAGNOSIS — Z23 Encounter for immunization: Secondary | ICD-10-CM

## 2015-12-18 DIAGNOSIS — A5901 Trichomonal vulvovaginitis: Secondary | ICD-10-CM

## 2015-12-18 DIAGNOSIS — F3132 Bipolar disorder, current episode depressed, moderate: Secondary | ICD-10-CM

## 2015-12-18 DIAGNOSIS — R350 Frequency of micturition: Secondary | ICD-10-CM

## 2015-12-18 DIAGNOSIS — Z124 Encounter for screening for malignant neoplasm of cervix: Secondary | ICD-10-CM

## 2015-12-18 LAB — POCT URINALYSIS DIPSTICK
Bilirubin, UA: NEGATIVE
GLUCOSE UA: NEGATIVE
KETONES UA: NEGATIVE
Nitrite, UA: NEGATIVE
RBC UA: NEGATIVE
Spec Grav, UA: 1.015
UROBILINOGEN UA: 0.2
pH, UA: 6

## 2015-12-18 MED ORDER — AZITHROMYCIN 250 MG PO TABS: ORAL_TABLET | ORAL | Status: DC

## 2015-12-18 MED ORDER — FAMOTIDINE 20 MG PO TABS: ORAL_TABLET | ORAL | Status: DC

## 2015-12-18 MED ORDER — CEFTRIAXONE SODIUM 250 MG IJ SOLR: 250.0000 mg | Freq: Once | INTRAMUSCULAR | Status: DC

## 2015-12-18 MED ORDER — METRONIDAZOLE 500 MG PO TABS: ORAL_TABLET | ORAL | Status: DC

## 2015-12-18 NOTE — Progress Notes (Signed)
Subjective:    Patient ID: Natalie Fischer, female    DOB: Jul 20, 1981, 35 y.o.   MRN: [id number]  HPI   Burning on urination started 2 days ago.   Vaginal irritation for 1 1/2 weeks.  Was waiting to address as had had a period start on February 12th.  Period lasts 5 days.  Uses pads.  Uses Always pads.   Is having vaginal discharge.  Sometimes has an odor --fishy.  Lot of itching.  Is sexually active.  Had a lot of pressure discomfort with sex then. Monogamous with one person on and off for about 2 years.  He has not had any genital symptoms she is aware of. Has not been on antibiotics recently. Has had an STD, but years ago.  She believes was either chlamydia or gonorrhea.  Looking in old records, documented Trichomonas, Chlamydia in past--appears 8 years ago, however. Does have a history of Bacterial vaginosis every 3 months.  Takes Flagyl and then gets a yeast infection for which she needs Fluconazole subsequently.  2.  Dental pain:  Diffuse problems with teeth.  3.  Problems with maintaining weight.  Bad teeth and GERD symptoms seem to cause her problems with this.  Belching and regurgitation a lot.   Does take NSAIDS for period pain and sometimes for tooth pain. Does drink cola--difficult to quantitate.  Does eat a lot of onions.  Loves chocolate.  Does drink liquor and beer.  6 beers a day and a couple shots of liquor. Rarely smokes--generally takes a puff off someone else's cigarette.  Does lie down soon after eating frequently. Has food access problems 2-4 times weekly.    4.  Bipolar Disorder:  Is not taking Citalopram--made her feel "buzzy"  And could not sleep.  Only took for 2 days.  States she was taking her Depakote, Latuda, and Hydroxyzine when she started on the Citalopram.  This was months ago.   Diagnosed with Bipolar I in 2011.  Depression and Mania are equally problematic for her.   Had a manic episode ending a week ago.  Can last 2 weeks. Last episode of depression  was just before the manic episode, which is typical for her.  Difficulty sleeping, sexually promiscuous, a lot of "oh my god"  Instances, "did I really do that?"  Took her last Latuda 3 days ago.  Gets for free at [facility]. Has to pay $12 for Depakote. $3-$4 for Hydoxyzine.  Currently going through application for disability.  Current outpatient prescriptions:  .  divalproex (DEPAKOTE) 500 MG DR tablet, Take 500 mg by mouth 2 (two) times daily., Disp: , Rfl:  .  hydrOXYzine (ATARAX/VISTARIL) 25 MG tablet, Take 1 tablet (25 mg total) by mouth every 6 (six) hours as needed for anxiety (sleep). (Patient taking differently: Take 25 mg by mouth every 6 (six) hours as needed for anxiety (and/or sleep). ), Disp: 45 tablet, Rfl: 0 .  ibuprofen (ADVIL,MOTRIN) 200 MG tablet, Take 4 tablets (800 mg total) by mouth every 8 (eight) hours as needed for moderate pain., Disp: 30 tablet, Rfl: 0 .  lurasidone (LATUDA) 40 MG TABS tablet, Take 40 mg by mouth at bedtime., Disp: , Rfl:  .  Omega-3 Fatty Acids (FISH OIL PO), Take 2 capsules by mouth 2 (two) times daily., Disp: , Rfl:  .  azithromycin (ZITHROMAX) 250 MG tablet, 4 tabs by mouth in a single dose, Disp: 4 tablet, Rfl: 0 .  cefTRIAXone (ROCEPHIN) 250 MG injection, Inject 250  mg into the muscle once.  FOR IM use in LARGE MUSCLE MASS, Disp: 1 each, Rfl: 0 .  citalopram (CELEXA) 20 MG tablet, Take 20 mg by mouth daily. Reported on 12/18/2015, Disp: , Rfl:  .  famotidine (PEPCID) 20 MG tablet, 2 tabs by mouth 1/2 hour before morning meal, Disp: 60 tablet, Rfl: 4 .  metroNIDAZOLE (FLAGYL) 500 MG tablet, 4 tabs by mouth for 1 dose, Disp: 4 tablet, Rfl: 0     Review of Systems     Objective:   Physical Exam NAD Very thin female, NAD HEENT:  Extremely poor condition of teeth with many missing and broken and with caries.  Throat without injection, TMs pearly gray Neck: Supple, no adenopathy or thyromegaly Chest:  CTA CV:  RRR without murmur or rub Abd:   S, mild tenderness in bilateral lower quadrants, +BS, No HSM or masses Pelvic:  Thick cream colored discharge with odor, + whiff, Diffuse cervical and vaginal mucosa erythema.  Mild CMT and tenderness on palpation of uterus and adnexa, but no mass.  Pap taken as well today as patient stated had not been done in several years.       Assessment & Plan:  1.  Trichomonas vaginitis:  Not clear if mild Pelvic Inflammatory Disease.  GC/Chlamydia swab taken and sent as well.  Pt. Given Ceftriaxone 250 mg IM and Rx for Azithromycin 1 g by mouth and Metronidazole 2 g by mouth. She has already notified her partner and sounds like he suspected something already and has been treated with unknown antibiotics.  2.  GERD:  Famotidine 40 mg in the morning before breakfast.  GERD precautions discussed at length.  Stop NSAIDS as well.  3.  Bipolar I Disorder:  Encouraged patient to follow up with [facility].  Warm hand off to [facility], LCSW to help her stabilize with her meds.  Feel her drug dependency and other health issues are directly related to difficulties stabilizing on meds.  4.  Severe Dental Decay:  Dental Referral.  5.  Weight loss/failure to thrive:  Likely directly related to Cocaine abuse, Bipolar Disorder and dental decay pain.  Need to address these issues.  6. Cocaine abuse:  As above.

## 2015-12-19 LAB — HIV ANTIBODY (ROUTINE TESTING W REFLEX): HIV Screen 4th Generation wRfx: NONREACTIVE

## 2015-12-21 LAB — GC/CHLAMYDIA PROBE AMP
Chlamydia trachomatis, NAA: NEGATIVE
Neisseria gonorrhoeae by PCR: NEGATIVE

## 2016-01-07 ENCOUNTER — Telehealth: Payer: Self-pay | Admitting: Internal Medicine

## 2016-01-07 NOTE — Telephone Encounter (Signed)
Patient called today to request results for her pap and would like for someone to contact her today.

## 2016-01-07 NOTE — Telephone Encounter (Signed)
No results in system. Will call tomorrow for results

## 2016-01-08 NOTE — Telephone Encounter (Signed)
Results faxed over left message on machine normal pap and return call

## 2016-01-08 NOTE — Telephone Encounter (Signed)
Spoke with [facility] they will fax over pap results

## 2016-01-15 ENCOUNTER — Ambulatory Visit: Payer: No Typology Code available for payment source | Admitting: Internal Medicine

## 2016-01-21 ENCOUNTER — Emergency Department (HOSPITAL_COMMUNITY)
Admission: EM | Admit: 2016-01-21 | Discharge: 2016-01-22 | Disposition: A | Discharge status: Other Acute Inpatient Hospital | Payer: Federal, State, Local not specified - Other

## 2016-01-21 ENCOUNTER — Encounter (HOSPITAL_COMMUNITY): Payer: Self-pay | Admitting: Emergency Medicine

## 2016-01-21 DIAGNOSIS — Z8719 Personal history of other diseases of the digestive system: Secondary | ICD-10-CM | POA: Insufficient documentation

## 2016-01-21 DIAGNOSIS — F1210 Cannabis abuse, uncomplicated: Secondary | ICD-10-CM | POA: Insufficient documentation

## 2016-01-21 DIAGNOSIS — Z87440 Personal history of urinary (tract) infections: Secondary | ICD-10-CM | POA: Insufficient documentation

## 2016-01-21 DIAGNOSIS — Z8669 Personal history of other diseases of the nervous system and sense organs: Secondary | ICD-10-CM | POA: Insufficient documentation

## 2016-01-21 DIAGNOSIS — Z8619 Personal history of other infectious and parasitic diseases: Secondary | ICD-10-CM | POA: Insufficient documentation

## 2016-01-21 DIAGNOSIS — F17210 Nicotine dependence, cigarettes, uncomplicated: Secondary | ICD-10-CM | POA: Insufficient documentation

## 2016-01-21 DIAGNOSIS — R630 Anorexia: Secondary | ICD-10-CM | POA: Insufficient documentation

## 2016-01-21 DIAGNOSIS — F1494 Cocaine use, unspecified with cocaine-induced mood disorder: Secondary | ICD-10-CM | POA: Diagnosis present

## 2016-01-21 DIAGNOSIS — R112 Nausea with vomiting, unspecified: Secondary | ICD-10-CM | POA: Insufficient documentation

## 2016-01-21 DIAGNOSIS — F1410 Cocaine abuse, uncomplicated: Secondary | ICD-10-CM | POA: Diagnosis present

## 2016-01-21 DIAGNOSIS — F1420 Cocaine dependence, uncomplicated: Secondary | ICD-10-CM | POA: Insufficient documentation

## 2016-01-21 DIAGNOSIS — Z79899 Other long term (current) drug therapy: Secondary | ICD-10-CM | POA: Insufficient documentation

## 2016-01-21 DIAGNOSIS — F3132 Bipolar disorder, current episode depressed, moderate: Secondary | ICD-10-CM | POA: Insufficient documentation

## 2016-01-21 DIAGNOSIS — Z3202 Encounter for pregnancy test, result negative: Secondary | ICD-10-CM | POA: Insufficient documentation

## 2016-01-21 DIAGNOSIS — F1310 Sedative, hypnotic or anxiolytic abuse, uncomplicated: Secondary | ICD-10-CM | POA: Insufficient documentation

## 2016-01-21 LAB — COMPREHENSIVE METABOLIC PANEL
ALBUMIN: 4.4 g/dL (ref 3.5–5.0)
ALK PHOS: 54 U/L (ref 38–126)
ALT: 13 U/L — AB (ref 14–54)
ANION GAP: 8 (ref 5–15)
AST: 21 U/L (ref 15–41)
BUN: 7 mg/dL (ref 6–20)
CALCIUM: 9.3 mg/dL (ref 8.9–10.3)
CHLORIDE: 103 mmol/L (ref 101–111)
CO2: 29 mmol/L (ref 22–32)
Creatinine, Ser: 0.91 mg/dL (ref 0.44–1.00)
GFR calc non Af Amer: >60 mL/min (ref >60)
GLUCOSE: 95 mg/dL (ref 65–99)
Potassium: 4.4 mmol/L (ref 3.5–5.1)
SODIUM: 140 mmol/L (ref 135–145)
Total Bilirubin: 0.5 mg/dL (ref 0.3–1.2)
Total Protein: 8 g/dL (ref 6.5–8.1)

## 2016-01-21 LAB — CBC WITH DIFFERENTIAL/PLATELET
Basophils Absolute: 0 10*3/uL (ref 0.0–0.1)
Basophils Relative: 0 %
EOS ABS: 0.1 10*3/uL (ref 0.0–0.7)
Eosinophils Relative: 2 %
HEMATOCRIT: 42.3 % (ref 36.0–46.0)
HEMOGLOBIN: 13.9 g/dL (ref 12.0–15.0)
LYMPHS ABS: 2.1 10*3/uL (ref 0.7–4.0)
LYMPHS PCT: 42 %
MCH: 29.5 pg (ref 26.0–34.0)
MCHC: 32.9 g/dL (ref 30.0–36.0)
MCV: 89.8 fL (ref 78.0–100.0)
MONOS PCT: 14 %
Monocytes Absolute: 0.7 10*3/uL (ref 0.1–1.0)
NEUTROS ABS: 2.1 10*3/uL (ref 1.7–7.7)
NEUTROS PCT: 42 %
Platelets: 134 10*3/uL — ABNORMAL LOW (ref 150–400)
RBC: 4.71 MIL/uL (ref 3.87–5.11)
RDW: 12.6 % (ref 11.5–15.5)
WBC: 5 10*3/uL (ref 4.0–10.5)

## 2016-01-21 LAB — ETHANOL: Alcohol, Ethyl (B): <5 mg/dL (ref <5)

## 2016-01-21 NOTE — ED Notes (Signed)
Pt states she is having suicidal ideations  Pt states she always has those thoughts in her head but they are worse lately  Pt states she could see herself cutting her wrist and watching the blood drip out or thought about running out in traffic  Pt states she has not been taking her medicine  Pt states she uses cocaine and xanax  Pt states she does not eat like she should and lately when she eats she throws up

## 2016-01-21 NOTE — ED Provider Notes (Signed)
CSN: [id number]     Arrival date & time 01/21/16  2211 History   First MD Initiated Contact with Patient 01/21/16 2224     No chief complaint on file.    The history is provided by the patient.  Patient presents with substance abuse and depression. States there is a history of bipolar disorder that is poorly controlled. States she's not been taking the medicines because of her cocaine use. States she uses cocaine and opiates. Also occasionally some benzos to help come off the cocaine. Occasional alcohol. States she has become much more suicidal. States she's been visualizing herself cutting her wrist and watching the blood come out. Brought in by therapeutic alternatives. No abdominal pain. She's had a dull headache. States she's had nausea and vomiting and decreased appetite. No dysuria.   Past Medical History  Diagnosis Date  . GERD (gastroesophageal reflux disease)   . Ectopic pregnancy 2008 and 2014    2008 required surgery, 2nd Misoprostol  . Bipolar 1 disorder (HCC)   . UTI (lower urinary tract infection)   . Trichomonas vaginitis   . Chlamydia   . Anxiety   . Insomnia   . Constipation   . Carpal tunnel syndrome of right wrist 2016   Past Surgical History  Procedure Laterality Date  . Ectopic pregnancy surgery Right 2008   Family History  Problem Relation Age of Onset  . Cancer Sister 346  . Alcohol abuse Neg Hx   . Arthritis Neg Hx   . Asthma Neg Hx   . Birth defects Neg Hx   . Depression Neg Hx   . Diabetes Neg Hx   . Drug abuse Neg Hx   . Early death Neg Hx   . Hearing loss Neg Hx   . Heart disease Neg Hx   . Hyperlipidemia Neg Hx   . Hypertension Neg Hx   . Kidney disease Neg Hx   . Learning disabilities Neg Hx   . Mental illness Neg Hx   . Mental retardation Neg Hx   . Miscarriages / Stillbirths Neg Hx   . Stroke Neg Hx   . Vision loss Neg Hx   . Varicose Veins Neg Hx   . COPD Sister    Social History  Substance Use Topics  . Smoking status: Current  Some Day Smoker -- 0.25 packs/day    Types: Cigarettes    Start date: 05/15/1999    Last Attempt to Quit: 04/12/2014  . Smokeless tobacco: Never Used  . Alcohol Use: 0.0 oz/week    0 Standard drinks or equivalent per week     Comment: occassional   OB History    Gravida Para Term Preterm AB TAB SAB Ectopic Multiple Living   2    2   2   0     Review of Systems  Constitutional: Positive for appetite change and fatigue. Negative for activity change.  Eyes: Negative for pain.  Respiratory: Negative for chest tightness and shortness of breath.   Cardiovascular: Negative for chest pain and leg swelling.  Gastrointestinal: Positive for nausea and vomiting. Negative for abdominal pain and diarrhea.  Genitourinary: Negative for flank pain.  Musculoskeletal: Negative for back pain and neck stiffness.  Skin: Negative for rash.  Neurological: Positive for headaches. Negative for weakness and numbness.  Psychiatric/Behavioral: Negative for behavioral problems.      Allergies  Review of patient's allergies indicates no known allergies.  Home Medications   Prior to Admission medications   Medication Sig  Start Date End Date Taking? Authorizing Provider  divalproex (DEPAKOTE) 500 MG DR tablet Take 500 mg by mouth 2 (two) times daily.   Yes Historical Provider, MD  ibuprofen (ADVIL,MOTRIN) 200 MG tablet Take 4 tablets (800 mg total) by mouth every 8 (eight) hours as needed for moderate pain. 02/27/15  Yes Sanjuana Kava, NP  lurasidone (LATUDA) 40 MG TABS tablet Take 40 mg by mouth at bedtime.   Yes Historical Provider, MD  azithromycin (ZITHROMAX) 250 MG tablet 4 tabs by mouth in a single dose Patient not taking: Reported on 01/21/2016 12/18/15   Julieanne Manson, MD  cefTRIAXone (ROCEPHIN) 250 MG injection Inject 250 mg into the muscle once.  FOR IM use in LARGE MUSCLE MASS Patient not taking: Reported on 01/21/2016 12/18/15   Julieanne Manson, MD  famotidine (PEPCID) 20 MG tablet 2 tabs by  mouth 1/2 hour before morning meal Patient not taking: Reported on 01/21/2016 12/18/15   Julieanne Manson, MD  hydrOXYzine (ATARAX/VISTARIL) 25 MG tablet Take 1 tablet (25 mg total) by mouth every 6 (six) hours as needed for anxiety (sleep). Patient not taking: Reported on 01/21/2016 02/27/15   Sanjuana Kava, NP  metroNIDAZOLE (FLAGYL) 500 MG tablet 4 tabs by mouth for 1 dose Patient not taking: Reported on 01/21/2016 12/18/15   Julieanne Manson, MD   BP 120/83 mmHg  Pulse 71  Temp(Src) 98 F (36.7 C) (Oral)  Resp 20  SpO2 100%  LMP 12/23/2015 (Approximate) Physical Exam  Constitutional: She appears well-developed.  HENT:  Head: Atraumatic.  Eyes: Pupils are equal, round, and reactive to light.  Neck: Neck supple.  Cardiovascular: Normal rate.   Pulmonary/Chest: Effort normal.  Abdominal: Soft.  Musculoskeletal: Normal range of motion. She exhibits no edema.  Neurological: She is alert.  Skin: Skin is warm.  Psychiatric: Her behavior is normal.    ED Course  Procedures (including critical care time) Labs Review Labs Reviewed  COMPREHENSIVE METABOLIC PANEL  ETHANOL  URINE RAPID DRUG SCREEN, HOSP PERFORMED  URINALYSIS, ROUTINE W REFLEX MICROSCOPIC (NOT AT [facility])  PREGNANCY, URINE  CBC WITH DIFFERENTIAL/PLATELET    Imaging Review No results found. I have personally reviewed and evaluated these images and lab results as part of my medical decision-making.   EKG Interpretation None      MDM   Final diagnoses:  Bipolar 1 disorder, depressed, moderate (HCC)  Cocaine use disorder, severe, dependence (HCC)    Patient presents with suicidal thoughts and substance abuse. History same. Has been able to pass. States she's been off her medications. Will check labs for medical clearance and then to be seen by TTS.    Benjiman Core, MD 01/21/16 [phone]

## 2016-01-22 ENCOUNTER — Inpatient Hospital Stay (HOSPITAL_COMMUNITY)
Admission: AD | Admit: 2016-01-22 | Discharge: 2016-01-27 | DRG: 885 | Disposition: A | Discharge status: Home / Self Care | Payer: Federal, State, Local not specified - Other | Source: Intra-hospital | Attending: Psychiatry | Admitting: Psychiatry

## 2016-01-22 ENCOUNTER — Encounter (HOSPITAL_COMMUNITY): Payer: Self-pay | Admitting: *Deleted

## 2016-01-22 DIAGNOSIS — F1420 Cocaine dependence, uncomplicated: Secondary | ICD-10-CM | POA: Diagnosis present

## 2016-01-22 DIAGNOSIS — F1410 Cocaine abuse, uncomplicated: Secondary | ICD-10-CM | POA: Diagnosis present

## 2016-01-22 DIAGNOSIS — Z809 Family history of malignant neoplasm, unspecified: Secondary | ICD-10-CM | POA: Diagnosis not present

## 2016-01-22 DIAGNOSIS — K219 Gastro-esophageal reflux disease without esophagitis: Secondary | ICD-10-CM | POA: Diagnosis present

## 2016-01-22 DIAGNOSIS — F17210 Nicotine dependence, cigarettes, uncomplicated: Secondary | ICD-10-CM | POA: Diagnosis present

## 2016-01-22 DIAGNOSIS — R45851 Suicidal ideations: Secondary | ICD-10-CM | POA: Diagnosis present

## 2016-01-22 DIAGNOSIS — F1994 Other psychoactive substance use, unspecified with psychoactive substance-induced mood disorder: Secondary | ICD-10-CM | POA: Diagnosis present

## 2016-01-22 DIAGNOSIS — G4700 Insomnia, unspecified: Secondary | ICD-10-CM | POA: Diagnosis present

## 2016-01-22 DIAGNOSIS — F1310 Sedative, hypnotic or anxiolytic abuse, uncomplicated: Secondary | ICD-10-CM | POA: Clinically undetermined

## 2016-01-22 DIAGNOSIS — F1494 Cocaine use, unspecified with cocaine-induced mood disorder: Secondary | ICD-10-CM

## 2016-01-22 DIAGNOSIS — F1220 Cannabis dependence, uncomplicated: Secondary | ICD-10-CM | POA: Diagnosis not present

## 2016-01-22 DIAGNOSIS — F3132 Bipolar disorder, current episode depressed, moderate: Secondary | ICD-10-CM | POA: Diagnosis not present

## 2016-01-22 DIAGNOSIS — F419 Anxiety disorder, unspecified: Secondary | ICD-10-CM | POA: Diagnosis present

## 2016-01-22 DIAGNOSIS — Z825 Family history of asthma and other chronic lower respiratory diseases: Secondary | ICD-10-CM | POA: Diagnosis not present

## 2016-01-22 DIAGNOSIS — F19929 Other psychoactive substance use, unspecified with intoxication, unspecified: Secondary | ICD-10-CM

## 2016-01-22 DIAGNOSIS — K5900 Constipation, unspecified: Secondary | ICD-10-CM | POA: Diagnosis present

## 2016-01-22 LAB — PREGNANCY, URINE: Preg Test, Ur: NEGATIVE

## 2016-01-22 LAB — URINALYSIS, ROUTINE W REFLEX MICROSCOPIC
Bilirubin Urine: NEGATIVE
Glucose, UA: NEGATIVE mg/dL
Hgb urine dipstick: NEGATIVE
KETONES UR: NEGATIVE mg/dL
LEUKOCYTES UA: NEGATIVE
NITRITE: NEGATIVE
PH: 7 (ref 5.0–8.0)
Protein, ur: NEGATIVE mg/dL
SPECIFIC GRAVITY, URINE: 1.017 (ref 1.005–1.030)

## 2016-01-22 LAB — RAPID URINE DRUG SCREEN, HOSP PERFORMED
Amphetamines: NOT DETECTED
Barbiturates: NOT DETECTED
Benzodiazepines: POSITIVE — AB
Cocaine: POSITIVE — AB
OPIATES: NOT DETECTED
Tetrahydrocannabinol: POSITIVE — AB

## 2016-01-22 MED ORDER — DIVALPROEX SODIUM 500 MG PO DR TAB
500.0000 mg | DELAYED_RELEASE_TABLET | Freq: Two times a day (BID) | ORAL | Status: DC
  Administered 2016-01-22 – 2016-01-23 (×2): 500 mg via ORAL
  Filled 2016-01-22 (×5): qty 1

## 2016-01-22 MED ORDER — NICOTINE 21 MG/24HR TD PT24
21.0000 mg | MEDICATED_PATCH | Freq: Every day | TRANSDERMAL | Status: DC
  Administered 2016-01-22: 21 mg via TRANSDERMAL
  Filled 2016-01-22: qty 1

## 2016-01-22 MED ORDER — ALUM & MAG HYDROXIDE-SIMETH 200-200-20 MG/5ML PO SUSP
30.0000 mL | ORAL | Status: DC | PRN
  Administered 2016-01-23: 30 mL via ORAL
  Filled 2016-01-22: qty 30

## 2016-01-22 MED ORDER — LORAZEPAM 1 MG PO TABS: 1.0000 mg | ORAL_TABLET | Freq: Three times a day (TID) | ORAL | Status: DC | PRN

## 2016-01-22 MED ORDER — IBUPROFEN 200 MG PO TABS: 600.0000 mg | ORAL_TABLET | Freq: Three times a day (TID) | ORAL | Status: DC | PRN

## 2016-01-22 MED ORDER — ONDANSETRON HCL 4 MG PO TABS: 4.0000 mg | ORAL_TABLET | Freq: Three times a day (TID) | ORAL | Status: DC | PRN

## 2016-01-22 MED ORDER — HYDROXYZINE HCL 25 MG PO TABS: 25.0000 mg | ORAL_TABLET | Freq: Four times a day (QID) | ORAL | Status: DC | PRN

## 2016-01-22 MED ORDER — IBUPROFEN 600 MG PO TABS
600.0000 mg | ORAL_TABLET | Freq: Three times a day (TID) | ORAL | Status: DC | PRN
  Administered 2016-01-23 – 2016-01-24 (×2): 600 mg via ORAL
  Filled 2016-01-22 (×2): qty 1

## 2016-01-22 MED ORDER — HYDROXYZINE HCL 25 MG PO TABS
25.0000 mg | ORAL_TABLET | Freq: Four times a day (QID) | ORAL | Status: DC | PRN
  Administered 2016-01-22 – 2016-01-27 (×8): 25 mg via ORAL
  Filled 2016-01-22 (×6): qty 1
  Filled 2016-01-22: qty 20
  Filled 2016-01-22 (×3): qty 1

## 2016-01-22 MED ORDER — ZOLPIDEM TARTRATE 5 MG PO TABS
5.0000 mg | ORAL_TABLET | Freq: Every evening | ORAL | Status: DC | PRN
  Administered 2016-01-22: 5 mg via ORAL
  Filled 2016-01-22: qty 1

## 2016-01-22 MED ORDER — ALUM & MAG HYDROXIDE-SIMETH 200-200-20 MG/5ML PO SUSP
30.0000 mL | ORAL | Status: DC | PRN
  Administered 2016-01-22: 30 mL via ORAL
  Filled 2016-01-22: qty 30

## 2016-01-22 MED ORDER — ACETAMINOPHEN 325 MG PO TABS
650.0000 mg | ORAL_TABLET | ORAL | Status: DC | PRN
  Administered 2016-01-22: 650 mg via ORAL
  Filled 2016-01-22: qty 2

## 2016-01-22 MED ORDER — LURASIDONE HCL 40 MG PO TABS
40.0000 mg | ORAL_TABLET | Freq: Every day | ORAL | Status: DC
  Administered 2016-01-22: 40 mg via ORAL
  Filled 2016-01-22 (×2): qty 1

## 2016-01-22 MED ORDER — LURASIDONE HCL 40 MG PO TABS
40.0000 mg | ORAL_TABLET | Freq: Every day | ORAL | Status: DC
  Administered 2016-01-22: 40 mg via ORAL
  Filled 2016-01-22 (×3): qty 1

## 2016-01-22 MED ORDER — NICOTINE 21 MG/24HR TD PT24
21.0000 mg | MEDICATED_PATCH | Freq: Every day | TRANSDERMAL | Status: DC
  Administered 2016-01-23: 21 mg via TRANSDERMAL
  Filled 2016-01-22 (×6): qty 1

## 2016-01-22 MED ORDER — MAGNESIUM HYDROXIDE 400 MG/5ML PO SUSP
30.0000 mL | Freq: Every day | ORAL | Status: DC | PRN
  Administered 2016-01-25: 30 mL via ORAL
  Filled 2016-01-22: qty 30

## 2016-01-22 MED ORDER — DIVALPROEX SODIUM 500 MG PO DR TAB
500.0000 mg | DELAYED_RELEASE_TABLET | Freq: Two times a day (BID) | ORAL | Status: DC
  Administered 2016-01-22 (×2): 500 mg via ORAL
  Filled 2016-01-22 (×2): qty 1

## 2016-01-22 MED ORDER — ACETAMINOPHEN 325 MG PO TABS: 650.0000 mg | ORAL_TABLET | Freq: Four times a day (QID) | ORAL | Status: DC | PRN

## 2016-01-22 MED ORDER — ONDANSETRON HCL 4 MG PO TABS
4.0000 mg | ORAL_TABLET | Freq: Three times a day (TID) | ORAL | Status: DC | PRN
  Administered 2016-01-24 – 2016-01-25 (×2): 4 mg via ORAL
  Filled 2016-01-22 (×2): qty 1

## 2016-01-22 NOTE — Tx Team (Signed)
Initial Interdisciplinary Treatment Plan   PATIENT STRESSORS: Financial difficulties Legal issue Loss of cousin Marital or family conflict Substance abuse   PATIENT STRENGTHS: Ability for insight Communication skills Motivation for treatment/growth Supportive family/friends   PROBLEM LIST: Problem List/Patient Goals Date to be addressed Date deferred Reason deferred Estimated date of resolution  At risk for suicide 01/22/2016  01/22/2016   D/C  Depression 01/22/2016  01/22/2016   D/C  Substance Abuse 01/22/2016  01/22/2016   D/C  "Improve mental state and stay focus" 01/22/2016  01/22/2016   D/C  "Concentrate on staying clean" 01/22/2016  01/22/2016   D/C  "Increasing appetite" 01/22/2016  01/22/2016   D/C                     DISCHARGE CRITERIA:  Ability to meet basic life and health needs Adequate post-discharge living arrangements Improved stabilization in mood, thinking, and/or behavior Medical problems require only outpatient monitoring Motivation to continue treatment in a less acute level of care Need for constant or close observation no longer present Withdrawal symptoms are absent or subacute and managed without 24-hour nursing intervention  PRELIMINARY DISCHARGE PLAN: Attend 12-step recovery group Outpatient therapy Return to previous living arrangement  PATIENT/FAMIILY INVOLVEMENT: This treatment plan has been presented to and reviewed with the patient, Natalie Fischer.  The patient and family have been given the opportunity to ask questions and make suggestions.  Larry SierrasMiddleton, Stefano Trulson P 01/22/2016, 9:49 PM

## 2016-01-22 NOTE — Progress Notes (Signed)
Patient just arrived on unit was not able to attend wrap-up group.

## 2016-01-22 NOTE — ED Notes (Signed)
Pt AAO x 3, no distress noted, remains SI.  Monitoring for safety, Q 15 min checks in effect,  [facility].

## 2016-01-22 NOTE — Progress Notes (Signed)
Admission Note:  D85- 34 yr old female who presents voluntary, in no acute distress, for the treatment of SI, Depression, and Substance Abuse.  Patient was calm and cooperative with admission process. Patient presents with passive SI and contracts for safety upon admission. Patient reports that prior to admission, she was having a "nervous breakdown".  Patient states that prior to admission she was  visualizing taking her own life, depressed, had decreased appetite, and frequent crying spells.  Patient states "I couldn't live like that" and called [facility] for help.  Patient reports current stressors as daily cocaine use, "living back and fourth between family members", "legal issues" from a DWI, and a cousin getting murdered last summer.  Patient reports medical hx of two ectopic pregnancies.  Patient reports that she smokes a pack of cigarettes per week and abuses prescription medications to include Xanax, Percocet, and Vicodin.  Patient reports that she has unintentionally lost 30lbs over the last year due to decreased appetite and drinks Ensures to help.Patient reports sleep issues reporting that she stays up for four days straight per week.  Patient reports past hx of AVH but denies currently.  Patient reports that when she was experiencing AVH she would see "spiders and bugs crawling".  Patient states that [facility], she would like to work on her "mental state and staying focused" and "cpncentrate on staying clean and improving my appetite".   A- Skin was assessed and found to be clear of any abnormal marks apart from a scar on right elbow.  Patient searched and no contraband found, POC and unit policies explained and understanding verbalized. Consents obtained.  R- Patient had no additional questions or concerns.

## 2016-01-22 NOTE — ED Notes (Signed)
Pt. To [facility] from ED ambulatory without difficulty, to room  . [facility]. Pt. Alert and oriented, warm and dry, in no distress. Pt. Denies HI, and AVH. Patient reports SI with a plan to OD, or walk out in front of traffic. Patient able to verbal contract for safety with this nurse. Patient states that she uses cocaine and THC. Patient states she has been in a rehab of her addiction before and stayed clean for a while but had the urge to use again. Patient states she has been awake for 4 days. Ambien given for insomnia.  Pt. Encouraged to let nursing staff know of any concerns or needs.

## 2016-01-22 NOTE — ED Notes (Signed)

## 2016-01-22 NOTE — Progress Notes (Signed)
[facility]. Service of Dr. Dub MikesLugo, MD. Rosey BathKelly Avontae Burkhead, RN

## 2016-01-22 NOTE — ED Notes (Signed)
Pt has stayed in bed and in her room except to take a shower. Her affect is blunted and her mood is depressed. She said that she is very tired of being addicted to cocaine, and she cannot find a job. She has reached the point of wanting help.

## 2016-01-22 NOTE — Consult Note (Signed)
[facility] Face-to-Face Psychiatry Consult   Reason for Consult:  Suicidal ideations Referring Physician:  EDP Patient Identification: Natalie Fischer MRN:  [id number] Principal Diagnosis: Cocaine-induced mood disorder [facility]) Diagnosis:   Patient Active Problem List   Diagnosis Date Noted  . Cocaine abuse [F14.10] 01/22/2016    Priority: High  . 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 Bipolar 1 disorder, depressed, moderate (HCC) [F31.32] 02/20/2015  . 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 Cocaine use disorder, severe, dependence (HCC) [F14.20]   . Cannabis use disorder, moderate, dependence (HCC) [F12.20]   . ANEMIA [D64.9] 05/11/2010  . LOSS OF WEIGHT [R63.4] 03/07/2008  . ALLERGIC RHINITIS [J30.9] 01/18/2008  . INSOMNIA [G47.00] 01/18/2008  . ANXIETY [F41.1] 08/31/2007  . GERD [K21.9] 06/18/2007  . CONSTIPATION [K59.00] 06/18/2007  . CHLAMYDIA TRACHOMATIS, GU SITE NOS [A56.2] 06/17/2005    Total Time spent with patient: 45 minutes  Subjective:   Natalie Fischer is a 35 y.o. female patient admitted with suicidal ideations and a plan to cut herself with a razor.Marland Kitchen  HPI:  35 yo female who presented to the ED with depression and plan to cut herself with a razor blade, cocaine dependence.  "Yesterday, it was time." She is frustrated with her continual drug abuse.  Reports being "high the whole summer".  She was clean for 30 days in October but relapsed in November when she started hanging "out with the wrong crowd and bored."  "My brain won't stop."    Past Psychiatric History: substance abuse  Risk to Self: Suicidal Ideation: Yes-Currently Present Suicidal Intent: Yes-Currently Present Is patient at risk for suicide?: Yes Suicidal Plan?: Yes-Currently Present Specify Current Suicidal Plan: Cutting wrists to bleed out. Access to Means: Yes Specify Access to Suicidal Means: There are razors in the house What  has been your use of drugs/alcohol within the last 12 months?: Crack and xanax How many times?: 0 Other Self Harm Risks: Hx of cutting Triggers for Past Attempts: None known Intentional Self Injurious Behavior: Cutting Comment - Self Injurious Behavior: Past hx of cutting Risk to Others: Homicidal Ideation: No Thoughts of Harm to Others: No Current Homicidal Intent: No Current Homicidal Plan: No Access to Homicidal Means: No Identified Victim: No one History of harm to others?: No Assessment of Violence: None Noted Violent Behavior Description: None reported Does patient have access to weapons?: No Criminal Charges Pending?: Yes Describe Pending Criminal Charges: DUI & traffic ticket Does patient have a court date: Yes Court Date:  (Doesn't know the court date.) Prior Inpatient Therapy: Prior Inpatient Therapy: Yes Prior Therapy Dates: April '16 Prior Therapy Facilty/Provider(s): [facility] Reason for Treatment: SI Prior Outpatient Therapy: Prior Outpatient Therapy: Yes Prior Therapy Dates: Last couple of years Prior Therapy Facilty/Provider(s): [facility] Reason for Treatment: med management Does patient have an ACCT team?: No Does patient have Intensive In-House Services?  : No Does patient have [facility] services? : Yes Does patient have P4CC services?: No  Past Medical History:  Past Medical History  Diagnosis Date  . GERD (gastroesophageal reflux disease)   . Ectopic pregnancy 2008 and 2014    2008 required surgery, 2nd Misoprostol  . Bipolar 1 disorder (HCC)   . UTI (lower urinary tract infection)   . Trichomonas vaginitis   . Chlamydia   . Anxiety   . Insomnia   . Constipation   . Carpal tunnel syndrome of right wrist 2016    Past Surgical History  Procedure Laterality Date  . Ectopic pregnancy surgery Right 2008   Family History:  Family History  Problem Relation Age of Onset  . Cancer Sister 49  . Alcohol abuse Neg Hx   . Arthritis Neg Hx   . Asthma Neg Hx   .  Birth defects Neg Hx   . Depression Neg Hx   . Diabetes Neg Hx   . Drug abuse Neg Hx   . Early death Neg Hx   . Hearing loss Neg Hx   . Heart disease Neg Hx   . Hyperlipidemia Neg Hx   . Hypertension Neg Hx   . Kidney disease Neg Hx   . Learning disabilities Neg Hx   . Mental illness Neg Hx   . Mental retardation Neg Hx   . Miscarriages / Stillbirths Neg Hx   . Stroke Neg Hx   . Vision loss Neg Hx   . Varicose Veins Neg Hx   . COPD Sister    Family Psychiatric  History: none Social History:  History  Alcohol Use  . 0.0 oz/week  . 0 Standard drinks or equivalent per week    Comment: occassional     History  Drug Use  . Yes  . Special: Cocaine    Comment: Xanax    Social History   Social History  . Marital Status: Single    Spouse Name: N/A  . Number of Children: 0  . Years of Education: college-1   Occupational History  . unemployed     Previously worked [employer], Therapist, art.   Social History Main Topics  . Smoking status: Current Some Day Smoker -- 0.25 packs/day    Types: Cigarettes    Start date: 05/15/1999    Last Attempt to Quit: 04/12/2014  . Smokeless tobacco: Never Used  . Alcohol Use: 0.0 oz/week    0 Standard drinks or equivalent per week     Comment: occassional  . Drug Use: Yes    Special: Cocaine     Comment: Xanax  . Sexual Activity: No   Other Topics Concern  . None   Social History Narrative   Born in [place], [place] to [place], in 1998 when her mother got a job in one of the mills here.   Now living with her father.  Sometimes with her sister.   Parents separated.      Additional Social History:    Allergies:  No Known Allergies  Labs:  Results for orders placed or performed during the hospital encounter of 01/21/16 (from the past 48 hour(s))  Comprehensive metabolic panel     Status: Abnormal   Collection Time: 01/21/16 10:43 PM  Result Value Ref Range   Sodium 140 135 - 145 mmol/L    Potassium 4.4 3.5 - 5.1 mmol/L   Chloride 103 101 - 111 mmol/L   CO2 29 22 - 32 mmol/L   Glucose, Bld 95 65 - 99 mg/dL   BUN 7 6 - 20 mg/dL   Creatinine, Ser 0.91 0.44 - 1.00 mg/dL   Calcium 9.3 8.9 - 10.3 mg/dL   Total Protein 8.0 6.5 - 8.1 g/dL   Albumin 4.4 3.5 - 5.0 g/dL   AST 21 15 - 41 U/L   ALT 13 (L) 14 - 54 U/L   Alkaline Phosphatase 54 38 - 126 U/L   Total Bilirubin 0.5 0.3 - 1.2 mg/dL   GFR calc non Af Amer >60 >60 mL/min  GFR calc Af Amer >60 >60 mL/min    Comment: (NOTE) The eGFR has been calculated using the CKD EPI equation. This calculation has not been validated in all clinical situations. eGFR's persistently <60 mL/min signify possible Chronic Kidney Disease.    Anion gap 8 5 - 15  Ethanol     Status: None   Collection Time: 01/21/16 10:43 PM  Result Value Ref Range   Alcohol, Ethyl (B) <5 <5 mg/dL    Comment:        LOWEST DETECTABLE LIMIT FOR SERUM ALCOHOL IS 5 mg/dL FOR MEDICAL PURPOSES ONLY   CBC with Differential     Status: Abnormal   Collection Time: 01/21/16 10:43 PM  Result Value Ref Range   WBC 5.0 4.0 - 10.5 K/uL   RBC 4.71 3.87 - 5.11 MIL/uL   Hemoglobin 13.9 12.0 - 15.0 g/dL   HCT 42.3 36.0 - 46.0 %   MCV 89.8 78.0 - 100.0 fL   MCH 29.5 26.0 - 34.0 pg   MCHC 32.9 30.0 - 36.0 g/dL   RDW 12.6 11.5 - 15.5 %   Platelets 134 (L) 150 - 400 K/uL   Neutrophils Relative % 42 %   Neutro Abs 2.1 1.7 - 7.7 K/uL   Lymphocytes Relative 42 %   Lymphs Abs 2.1 0.7 - 4.0 K/uL   Monocytes Relative 14 %   Monocytes Absolute 0.7 0.1 - 1.0 K/uL   Eosinophils Relative 2 %   Eosinophils Absolute 0.1 0.0 - 0.7 K/uL   Basophils Relative 0 %   Basophils Absolute 0.0 0.0 - 0.1 K/uL  Urine rapid drug screen (hosp performed)     Status: Abnormal   Collection Time: 01/22/16  2:01 AM  Result Value Ref Range   Opiates NONE DETECTED NONE DETECTED   Cocaine POSITIVE (A) NONE DETECTED   Benzodiazepines POSITIVE (A) NONE DETECTED   Amphetamines NONE DETECTED NONE  DETECTED   Tetrahydrocannabinol POSITIVE (A) NONE DETECTED   Barbiturates NONE DETECTED NONE DETECTED    Comment:        DRUG SCREEN FOR MEDICAL PURPOSES ONLY.  IF CONFIRMATION IS NEEDED FOR ANY PURPOSE, NOTIFY LAB WITHIN 5 DAYS.        LOWEST DETECTABLE LIMITS FOR URINE DRUG SCREEN Drug Class       Cutoff (ng/mL) Amphetamine      1000 Barbiturate      200 Benzodiazepine   628 Tricyclics       315 Opiates          300 Cocaine          300 THC              50   Urinalysis, Routine w reflex microscopic     Status: Abnormal   Collection Time: 01/22/16  2:01 AM  Result Value Ref Range   Color, Urine YELLOW YELLOW   APPearance CLOUDY (A) CLEAR   Specific Gravity, Urine 1.017 1.005 - 1.030   pH 7.0 5.0 - 8.0   Glucose, UA NEGATIVE NEGATIVE mg/dL   Hgb urine dipstick NEGATIVE NEGATIVE   Bilirubin Urine NEGATIVE NEGATIVE   Ketones, ur NEGATIVE NEGATIVE mg/dL   Protein, ur NEGATIVE NEGATIVE mg/dL   Nitrite NEGATIVE NEGATIVE   Leukocytes, UA NEGATIVE NEGATIVE    Comment: MICROSCOPIC NOT DONE ON URINES WITH NEGATIVE PROTEIN, BLOOD, LEUKOCYTES, NITRITE, OR GLUCOSE <1000 mg/dL.  Pregnancy, urine     Status: None   Collection Time: 01/22/16  2:01 AM  Result Value Ref Range  Preg Test, Ur NEGATIVE NEGATIVE    Comment:        THE SENSITIVITY OF THIS METHODOLOGY IS >20 mIU/mL.     Current Facility-Administered Medications  Medication Dose Route Frequency Provider Last Rate Last Dose  . acetaminophen (TYLENOL) tablet 650 mg  650 mg Oral Q4H PRN Jola Schmidt, MD      . alum & mag hydroxide-simeth (MAALOX/MYLANTA) 200-200-20 MG/5ML suspension 30 mL  30 mL Oral PRN Jola Schmidt, MD      . divalproex (DEPAKOTE) DR tablet 500 mg  500 mg Oral BID Jola Schmidt, MD   500 mg at 01/22/16 3762  . hydrOXYzine (ATARAX/VISTARIL) tablet 25 mg  25 mg Oral Q6H PRN Jola Schmidt, MD      . ibuprofen (ADVIL,MOTRIN) tablet 600 mg  600 mg Oral Q8H PRN Jola Schmidt, MD      . lurasidone (LATUDA) tablet  40 mg  40 mg Oral QHS Jola Schmidt, MD   40 mg at 01/22/16 0231  . nicotine (NICODERM CQ - dosed in mg/24 hours) patch 21 mg  21 mg Transdermal Daily Jola Schmidt, MD   21 mg at 01/22/16 0924  . ondansetron (ZOFRAN) tablet 4 mg  4 mg Oral Q8H PRN Jola Schmidt, MD      . zolpidem [facility]) tablet 5 mg  5 mg Oral QHS PRN Jola Schmidt, MD   5 mg at 01/22/16 0231   Current Outpatient Prescriptions  Medication Sig Dispense Refill  . divalproex (DEPAKOTE) 500 MG DR tablet Take 500 mg by mouth 2 (two) times daily.    Marland Kitchen ibuprofen (ADVIL,MOTRIN) 200 MG tablet Take 4 tablets (800 mg total) by mouth every 8 (eight) hours as needed for moderate pain. 30 tablet 0  . lurasidone (LATUDA) 40 MG TABS tablet Take 40 mg by mouth at bedtime.    Marland Kitchen azithromycin (ZITHROMAX) 250 MG tablet 4 tabs by mouth in a single dose (Patient not taking: Reported on 01/21/2016) 4 tablet 0  . cefTRIAXone (ROCEPHIN) 250 MG injection Inject 250 mg into the muscle once.  FOR IM use in LARGE MUSCLE MASS (Patient not taking: Reported on 01/21/2016) 1 each 0  . famotidine (PEPCID) 20 MG tablet 2 tabs by mouth 1/2 hour before morning meal (Patient not taking: Reported on 01/21/2016) 60 tablet 4  . hydrOXYzine (ATARAX/VISTARIL) 25 MG tablet Take 1 tablet (25 mg total) by mouth every 6 (six) hours as needed for anxiety (sleep). (Patient not taking: Reported on 01/21/2016) 45 tablet 0  . metroNIDAZOLE (FLAGYL) 500 MG tablet 4 tabs by mouth for 1 dose (Patient not taking: Reported on 01/21/2016) 4 tablet 0    Musculoskeletal: Strength & Muscle Tone: within normal limits Gait & Station: normal Patient leans: N/A  Psychiatric Specialty Exam: Review of Systems  Constitutional: Negative.   Eyes: Negative.   Respiratory: Negative.   Cardiovascular: Negative.   Gastrointestinal: Negative.   Genitourinary: Negative.   Musculoskeletal: Negative.   Skin: Negative.   Neurological: Negative.   Endo/Heme/Allergies: Negative.    Psychiatric/Behavioral: Positive for depression, suicidal ideas and substance abuse.    Blood pressure 101/69, pulse 85, temperature 98.5 F (36.9 C), temperature source Oral, resp. rate 16, last menstrual period 12/23/2015, SpO2 100 %.There is no weight on file to calculate BMI.  General Appearance: Disheveled  Eye Sport and exercise psychologist::  Fair  Speech:  Normal Rate  Volume:  Normal  Mood:  Anxious and Depressed  Affect:  Congruent  Thought Process:  Coherent  Orientation:  Full (Time, Place,  and Person)  Thought Content:  Rumination  Suicidal Thoughts:  Yes.  with intent/plan  Homicidal Thoughts:  No  Memory:  Immediate;   Fair Recent;   Fair Remote;   Fair  Judgement:  Fair  Insight:  Fair  Psychomotor Activity:  Decreased  Concentration:  Fair  Recall:  [employer] of Knowledge:Fair  Language: Fair  Akathisia:  No  Handed:  Right  AIMS (if indicated):     Assets:  Leisure Time Physical Health Resilience Social Support  ADL's:  Intact  Cognition: WNL  Sleep:      Treatment Plan Summary: Daily contact with patient to assess and evaluate symptoms and progress in treatment, Medication management and Plan cocaine induced mood disorder:  -crisis stabilization -Medication management:  Continue her Depakote 500 mg BID for mood stabilization, Latuda 40 mg daily for bipolar disorder.  Start Vistaril 25 mg every six hours PRN anxiety -Individual and substance abuse counseling -Transfer to [facility] for stabilization  Disposition: Recommend psychiatric Inpatient admission when medically cleared.  Waylan Boga, NP 01/22/2016 2:36 PM Patient seen face-to-face for psychiatric evaluation, chart reviewed and case discussed with the physician extender and developed treatment plan. Reviewed the information documented and agree with the treatment plan. Corena Pilgrim, MD
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
## 2016-01-23 ENCOUNTER — Encounter (HOSPITAL_COMMUNITY): Payer: Self-pay | Admitting: Psychiatry

## 2016-01-23 DIAGNOSIS — F3132 Bipolar disorder, current episode depressed, moderate: Principal | ICD-10-CM

## 2016-01-23 DIAGNOSIS — F1220 Cannabis dependence, uncomplicated: Secondary | ICD-10-CM

## 2016-01-23 DIAGNOSIS — F19929 Other psychoactive substance use, unspecified with intoxication, unspecified: Secondary | ICD-10-CM

## 2016-01-23 DIAGNOSIS — F1420 Cocaine dependence, uncomplicated: Secondary | ICD-10-CM

## 2016-01-23 DIAGNOSIS — F1994 Other psychoactive substance use, unspecified with psychoactive substance-induced mood disorder: Secondary | ICD-10-CM | POA: Diagnosis present

## 2016-01-23 DIAGNOSIS — F1310 Sedative, hypnotic or anxiolytic abuse, uncomplicated: Secondary | ICD-10-CM | POA: Clinically undetermined

## 2016-01-23 MED ORDER — LURASIDONE HCL 40 MG PO TABS
40.0000 mg | ORAL_TABLET | Freq: Every day | ORAL | Status: DC
  Administered 2016-01-23 – 2016-01-26 (×4): 40 mg via ORAL
  Filled 2016-01-23 (×6): qty 1
  Filled 2016-01-23: qty 14

## 2016-01-23 MED ORDER — CHLORDIAZEPOXIDE HCL 25 MG PO CAPS
25.0000 mg | ORAL_CAPSULE | Freq: Four times a day (QID) | ORAL | Status: DC | PRN
  Administered 2016-01-24: 25 mg via ORAL
  Filled 2016-01-23: qty 1

## 2016-01-23 MED ORDER — CHLORDIAZEPOXIDE HCL 25 MG PO CAPS: 25.0000 mg | ORAL_CAPSULE | Freq: Four times a day (QID) | ORAL | Status: DC | PRN

## 2016-01-23 MED ORDER — TRAZODONE HCL 50 MG PO TABS
50.0000 mg | ORAL_TABLET | Freq: Once | ORAL | Status: AC
  Administered 2016-01-23: 50 mg via ORAL
  Filled 2016-01-23 (×2): qty 1

## 2016-01-23 MED ORDER — LAMOTRIGINE 25 MG PO TABS
25.0000 mg | ORAL_TABLET | Freq: Every day | ORAL | Status: DC
  Administered 2016-01-23 – 2016-01-27 (×5): 25 mg via ORAL
  Filled 2016-01-23 (×7): qty 1
  Filled 2016-01-23: qty 14

## 2016-01-23 MED ORDER — MIRTAZAPINE 15 MG PO TABS
15.0000 mg | ORAL_TABLET | Freq: Every day | ORAL | Status: DC
  Administered 2016-01-23 – 2016-01-25 (×3): 15 mg via ORAL
  Filled 2016-01-23 (×4): qty 1

## 2016-01-23 NOTE — Progress Notes (Signed)
D-  Patient has been in her room for the majority of the shift.  Patient reported feeling depressed but denies SI, HI and AVH.  Patient states that she is ready for sobriety.   A-  Assess for safety, offer medications as prescribed, engage in 1:1 staff talks,  R-  Patient able to contract for safety.
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
## 2016-01-23 NOTE — Progress Notes (Signed)
D: Patient was noted interacting with her roommate. Patient voices racing thoughts. Patient states goal is to " focus on being calm and not having racing thoughts." Support offered.  A: Support and encouragement offered. Q 15 minute checks in progress and maintained for safety. R: Patient remains safe on unit.

## 2016-01-23 NOTE — Progress Notes (Signed)
Patient did attend the evening speaker AA meeting.  

## 2016-01-23 NOTE — [facility] Group Notes (Signed)
Adult Therapy Group Note - Clinical Social Work  Date:  01/23/2016 Time:  10:00-11:00AM  Group Topic/Focus:  Healthy Coping Skills  Additional Comments:  The main focus of today's process group was to discuss patient-specific behaviors that have prevented them from living the life they want.  The reasons underlying these behaviors were touched on lightly.  How to make a plan to promote learning how to use different behaviors was then explored fully.  Pt reported that going back "constantly" to people, places and things that are bad for me, not staying consistent in her efforts to better herself, and giving up are three behaviors that keep her from living the life of sobriety that she truly wants to life.  She talked about not trusting herself to be able to pursue a sober life, and also referenced her family members putting her down and doubting her when she tries to pursue a sober life.  Participation Level:  Active  Participation Quality:  Attentive and Sharing  Affect:  Appropriate  Cognitive:  Alert  Insight: Good  Engagement in Group:  Engaged  Modes of Intervention:  Clarification and Discussion  Sarina SerGrossman-Orr, Yohana Bartha Jo 01/23/2016, 12:47 PM

## 2016-01-23 NOTE — [facility] Suicide Risk Assessment (Signed)
[facility]   Nursing information obtained from:  Patient Demographic factors:  Low socioeconomic status, Unemployed Current Mental Status:  Self-harm thoughts, Self-harm behaviors Loss Factors:  Loss of significant relationship, Legal issues, Financial problems / change in socioeconomic status Historical Factors:  Family history of mental illness or substance abuse, Anniversary of important loss Risk Reduction Factors:  Living with another person, especially a relative, Positive social support, Positive therapeutic relationship  Total Time spent with patient: 30 minutes Principal Problem: Bipolar 1 disorder, depressed, moderate (HCC) Diagnosis:   Patient Active Problem List   Diagnosis Date Noted  . Cocaine abuse [F14.10] 01/22/2016  . Bipolar 1 disorder, depressed, moderate (HCC) [F31.32] 02/20/2015  . Cocaine use disorder, severe, dependence (HCC) [F14.20]   . Cannabis use disorder, moderate, dependence (HCC) [F12.20]   . ANEMIA [D64.9] 05/11/2010  . LOSS OF WEIGHT [R63.4] 03/07/2008  . ALLERGIC RHINITIS [J30.9] 01/18/2008  . INSOMNIA [G47.00] 01/18/2008  . ANXIETY [F41.1] 08/31/2007  . GERD [K21.9] 06/18/2007  . CONSTIPATION [K59.00] 06/18/2007   Subjective Data: Pt states " I feel anxious , and kind of restless now. "   Continued Clinical Symptoms:  Alcohol Use Disorder Identification Test Final Score (AUDIT): 18 The "Alcohol Use Disorders Identification Test", Guidelines for Use in Primary Care, Second Edition.  World Science writerHealth Organization [facility](WHO). Score between 0-7:  no or low risk or alcohol related problems. Score between 8-15:  moderate risk of alcohol related problems. Score between 16-19:  high risk of alcohol related problems. Score 20 or above:  warrants further diagnostic evaluation for alcohol dependence and treatment.   CLINICAL FACTORS:   Alcohol/Substance Abuse/Dependencies Unstable or Poor Therapeutic Relationship Previous  Psychiatric Diagnoses and Treatments   Musculoskeletal: Strength & Muscle Tone: within normal limits Gait & Station: normal Patient leans: N/A  Psychiatric Specialty Exam: Review of Systems  Constitutional: Positive for chills and diaphoresis.  Psychiatric/Behavioral: Positive for depression and substance abuse. The patient is nervous/anxious and has insomnia.   All other systems reviewed and are negative.   Blood pressure 115/78, pulse 87, temperature 98.5 F (36.9 C), temperature source Oral, resp. rate 18, height 5\' 11"  (1.803 m), weight 51.256 kg (113 lb), last menstrual period 12/23/2015.Body mass index is 15.77 kg/(m^2).  General Appearance: Disheveled  Eye SolicitorContact::  Fair  Speech:  Clear and Coherent  Volume:  Decreased  Mood:  Anxious  Affect:  Constricted  Thought Process:  Coherent  Orientation:  Full (Time, Place, and Person)  Thought Content:  Rumination  Suicidal Thoughts:  No  Homicidal Thoughts:  No  Memory:  Immediate;   Fair Recent;   Fair Remote;   Fair  Judgement:  Impaired  Insight:  Fair  Psychomotor Activity:  Normal  Concentration:  Fair  Recall:  FiservFair  Fund of Knowledge:Fair  Language: Fair  Akathisia:  No  Handed:  Right  AIMS (if indicated):     Assets:  Desire for Improvement  Sleep:  Number of Hours: 4.25  Cognition: WNL  ADL's:  Intact    COGNITIVE FEATURES THAT CONTRIBUTE TO RISK:  Closed-mindedness, Polarized thinking and Thought constriction (tunnel vision)    SUICIDE RISK:   Moderate:  Frequent suicidal ideation with limited intensity, and duration, some specificity in terms of plans, no associated intent, good self-control, limited dysphoria/symptomatology, some risk factors present, and identifiable protective factors, including available and accessible social support.  PLAN OF CARE: Patient will benefit from inpatient treatment and stabilization.  Estimated length of stay is 5-7  days.  Reviewed past medical records,treatment  plan.  Case discussed with Aggie NP. Pt to be started on CIWA/librium prn for BZD withdrawal sx. Pt on Depakote , platelet level low- will DC Depakote. Will start Lamictal 25 mg po daily for mood sx. CSW will start working on disposition.  Patient to participate in therapeutic milieu .       I certify that inpatient services furnished can reasonably be expected to improve the patient's condition.   Maddyx Wieck, MD 01/23/2016, 1:40 PM

## 2016-01-23 NOTE — H&P (Signed)
Psychiatric Admission Assessment Adult  Patient Identification: Natalie Fischer  MRN:  [id number]  Date of Evaluation:  01/23/2016  Chief Complaint:  MDD RECURRENT  COCAINE USE DISORDER SEVERE  Principal Diagnosis: Bipolar 1 disorder, depressed, moderate ([place]), Cocaine use disorder, severe dependence  Diagnosis:   Patient Active Problem List   Diagnosis Date Noted  . Cocaine abuse [F14.10] 01/22/2016  . Cocaine-induced mood disorder ([place]) [F14.94] 01/22/2016  . Bipolar 1 disorder, depressed, moderate ([place]) [F31.32] 02/20/2015  . Severe major depression with psychotic features ([place]) [F32.3] 02/20/2015  . Cocaine use disorder, severe, dependence ([place]) [F14.20]   . Cannabis use disorder, moderate, dependence ([place]) [F12.20]   . ANEMIA [D64.9] 05/11/2010  . LOSS OF WEIGHT [R63.4] 03/07/2008  . ALLERGIC RHINITIS [J30.9] 01/18/2008  . INSOMNIA [G47.00] 01/18/2008  . ANXIETY [F41.1] 08/31/2007  . GERD [K21.9] 06/18/2007  . CONSTIPATION [K59.00] 06/18/2007  . CHLAMYDIA TRACHOMATIS, GU SITE NOS [A56.2] 06/17/2005   History of Present Illness: Natalie Fischer is a 35 year old AAF. She was a patient in this hospital sometime last year after receiving mood stabilization treatments for Bipolar affective disorder. Natalie Fischer also has substance abuse issues & was at the [facility] treatment center for substance abuse treatment in May through June of 2016. This time around, Natalie Fischer is admitted to the [facility] adult unit from the [facility] with complaints of severe depression. During this assessment, she reports, "I called the Therapeutic Services Hot-line on Thursday night for help. I was feeling very depressed, feeling overwhelmed. I had this intense feeling of hurting myself. I recently relapsed on Cocaine. It happened around November of 2016. I have been using since then on daily basis. That got me very depressed, I stopped eating, lost 20 pounds in 2 weeks. I was at the [facility] residential  last May, 2016. I got out in June of 2016. I stopped taking my mental health medicines, stopped following up with my mental health appointments. That was what led to my relapse on cocaine. I also used Xanax to help me come off of cocaine intoxication. I would like to get back on my Remeron, Latuda, Vistaril & Depakote. My mind is constantly racing".  A pending DWI charge.  Associated Signs/Symptoms:  Depression Symptoms:  insomnia, feelings of worthlessness/guilt, hopelessness, anxiety, loss of energy/fatigue, weight loss,  (Hypo) Manic Symptoms:  Impulsivity,  Anxiety Symptoms:  Excessive Worry,  Psychotic Symptoms:  Denies any psychotic symptoms  PTSD Symptoms: Denies  Total Time spent with patient: 1 hour  Past Psychiatric History: Bipolar affective disorder  Is the patient at risk to self? No.  Has the patient been a risk to self in the past 6 months? No.  Has the patient been a risk to self within the distant past? No.  Is the patient a risk to others? No.  Has the patient been a risk to others in the past 6 months? No.  Has the patient been a risk to others within the distant past? No.   Prior Inpatient Therapy: Yes [facility]) Prior Outpatient Therapy: [facility]  Alcohol Screening: 1. How often do you have a drink containing alcohol?: Monthly or less 2. How many drinks containing alcohol do you have on a typical day when you are drinking?: 5 or 6 3. How often do you have six or more drinks on one occasion?: Weekly Preliminary Score: 5 4. How often during the last year have you found that you were not able to stop drinking once you had started?: Weekly 5. How  often during the last year have you failed to do what was normally expected from you becasue of drinking?: Daily or almost daily 6. How often during the last year have you needed a first drink in the morning to get yourself going after a heavy drinking session?: Less than monthly 7. How often during the last year have you  had a feeling of guilt of remorse after drinking?: Daily or almost daily 8. How often during the last year have you been unable to remember what happened the night before because you had been drinking?: Never 9. Have you or someone else been injured as a result of your drinking?: No 10. Has a relative or friend or a doctor or another health worker been concerned about your drinking or suggested you cut down?: No Alcohol Use Disorder Identification Test Final Score (AUDIT): 18 Brief Intervention: Yes  Substance Abuse History in the last 12 months:  Yes.    Consequences of Substance Abuse: Medical Consequences:  Liver damage, Possible death by overdose Legal Consequences:  Arrests, jail time, Loss of driving privilege. Family Consequences:  Family discord, divorce and or separation.  Previous Psychotropic Medications: Yes (Depakote, Latuda, Vistaril, Remeron  Psychological Evaluations: Yes   Past Medical History:  Past Medical History  Diagnosis Date  . GERD (gastroesophageal reflux disease)   . Ectopic pregnancy 2008 and 2014    2008 required surgery, 2nd Misoprostol  . Bipolar 1 disorder ([place])   . UTI (lower urinary tract infection)   . Trichomonas vaginitis   . Chlamydia   . Anxiety   . Insomnia   . Constipation   . Carpal tunnel syndrome of right wrist 2016    Past Surgical History  Procedure Laterality Date  . Ectopic pregnancy surgery Right 2008   Family History:  Family History  Problem Relation Age of Onset  . Cancer Sister 46  . Alcohol abuse Neg Hx   . Arthritis Neg Hx   . Asthma Neg Hx   . Birth defects Neg Hx   . Depression Neg Hx   . Diabetes Neg Hx   . Drug abuse Neg Hx   . Early death Neg Hx   . Hearing loss Neg Hx   . Heart disease Neg Hx   . Hyperlipidemia Neg Hx   . Hypertension Neg Hx   . Kidney disease Neg Hx   . Learning disabilities Neg Hx   . Mental illness Neg Hx   . Mental retardation Neg Hx   . Miscarriages / Stillbirths Neg Hx   .  Stroke Neg Hx   . Vision loss Neg Hx   . Varicose Veins Neg Hx   . COPD Sister    Family Psychiatric  History: Bipolar disorder: Paternal aunt  Tobacco Screening: Smokes up to a pack of cigarettes daily  Social History:  History  Alcohol Use  . 0.0 oz/week  . 0 Standard drinks or equivalent per week    Comment: occassional     History  Drug Use  . Yes  . Special: Cocaine    Comment: Xanax    Additional Social History:  Allergies:  No Known Allergies  Lab Results:  Results for orders placed or performed during the hospital encounter of 01/21/16 (from the past 48 hour(s))  Comprehensive metabolic panel     Status: Abnormal   Collection Time: 01/21/16 10:43 PM  Result Value Ref Range   Sodium 140 135 - 145 mmol/L   Potassium 4.4 3.5 - 5.1 mmol/L  Chloride 103 101 - 111 mmol/L   CO2 29 22 - 32 mmol/L   Glucose, Bld 95 65 - 99 mg/dL   BUN 7 6 - 20 mg/dL   Creatinine, Ser 0.91 0.44 - 1.00 mg/dL   Calcium 9.3 8.9 - 10.3 mg/dL   Total Protein 8.0 6.5 - 8.1 g/dL   Albumin 4.4 3.5 - 5.0 g/dL   AST 21 15 - 41 U/L   ALT 13 (L) 14 - 54 U/L   Alkaline Phosphatase 54 38 - 126 U/L   Total Bilirubin 0.5 0.3 - 1.2 mg/dL   GFR calc non Af Amer >60 >60 mL/min   GFR calc Af Amer >60 >60 mL/min    Comment: (NOTE) The eGFR has been calculated using the CKD EPI equation. This calculation has not been validated in all clinical situations. eGFR's persistently <60 mL/min signify possible Chronic Kidney Disease.    Anion gap 8 5 - 15  Ethanol     Status: None   Collection Time: 01/21/16 10:43 PM  Result Value Ref Range   Alcohol, Ethyl (B) <5 <5 mg/dL    Comment:        LOWEST DETECTABLE LIMIT FOR SERUM ALCOHOL IS 5 mg/dL FOR MEDICAL PURPOSES ONLY   CBC with Differential     Status: Abnormal   Collection Time: 01/21/16 10:43 PM  Result Value Ref Range   WBC 5.0 4.0 - 10.5 K/uL   RBC 4.71 3.87 - 5.11 MIL/uL   Hemoglobin 13.9 12.0 - 15.0 g/dL   HCT 42.3 36.0 - 46.0 %   MCV  89.8 78.0 - 100.0 fL   MCH 29.5 26.0 - 34.0 pg   MCHC 32.9 30.0 - 36.0 g/dL   RDW 12.6 11.5 - 15.5 %   Platelets 134 (L) 150 - 400 K/uL   Neutrophils Relative % 42 %   Neutro Abs 2.1 1.7 - 7.7 K/uL   Lymphocytes Relative 42 %   Lymphs Abs 2.1 0.7 - 4.0 K/uL   Monocytes Relative 14 %   Monocytes Absolute 0.7 0.1 - 1.0 K/uL   Eosinophils Relative 2 %   Eosinophils Absolute 0.1 0.0 - 0.7 K/uL   Basophils Relative 0 %   Basophils Absolute 0.0 0.0 - 0.1 K/uL  Urine rapid drug screen (hosp performed)     Status: Abnormal   Collection Time: 01/22/16  2:01 AM  Result Value Ref Range   Opiates NONE DETECTED NONE DETECTED   Cocaine POSITIVE (A) NONE DETECTED   Benzodiazepines POSITIVE (A) NONE DETECTED   Amphetamines NONE DETECTED NONE DETECTED   Tetrahydrocannabinol POSITIVE (A) NONE DETECTED   Barbiturates NONE DETECTED NONE DETECTED    Comment:        DRUG SCREEN FOR MEDICAL PURPOSES ONLY.  IF CONFIRMATION IS NEEDED FOR ANY PURPOSE, NOTIFY LAB WITHIN 5 DAYS.        LOWEST DETECTABLE LIMITS FOR URINE DRUG SCREEN Drug Class       Cutoff (ng/mL) Amphetamine      1000 Barbiturate      200 Benzodiazepine   818 Tricyclics       563 Opiates          300 Cocaine          300 THC              50   Urinalysis, Routine w reflex microscopic     Status: Abnormal   Collection Time: 01/22/16  2:01 AM  Result Value Ref Range  Color, Urine YELLOW YELLOW   APPearance CLOUDY (A) CLEAR   Specific Gravity, Urine 1.017 1.005 - 1.030   pH 7.0 5.0 - 8.0   Glucose, UA NEGATIVE NEGATIVE mg/dL   Hgb urine dipstick NEGATIVE NEGATIVE   Bilirubin Urine NEGATIVE NEGATIVE   Ketones, ur NEGATIVE NEGATIVE mg/dL   Protein, ur NEGATIVE NEGATIVE mg/dL   Nitrite NEGATIVE NEGATIVE   Leukocytes, UA NEGATIVE NEGATIVE    Comment: MICROSCOPIC NOT DONE ON URINES WITH NEGATIVE PROTEIN, BLOOD, LEUKOCYTES, NITRITE, OR GLUCOSE <1000 mg/dL.  Pregnancy, urine     Status: None   Collection Time: 01/22/16  2:01 AM   Result Value Ref Range   Preg Test, Ur NEGATIVE NEGATIVE    Comment:        THE SENSITIVITY OF THIS METHODOLOGY IS >20 mIU/mL.    Blood Alcohol level:  Lab Results  Component Value Date   ETH <5 01/21/2016   ETH <5 34/19/3790   Metabolic Disorder Labs:  Lab Results  Component Value Date   HGBA1C 6.0* 02/21/2015   MPG 126 02/21/2015   No results found for: PROLACTIN Lab Results  Component Value Date   CHOL 138 02/21/2015   TRIG 60 02/21/2015   HDL 51 02/21/2015   CHOLHDL 2.7 02/21/2015   VLDL 12 02/21/2015   LDLCALC 75 02/21/2015   LDLCALC 86 04/03/2009   Current Medications: Current Facility-Administered Medications  Medication Dose Route Frequency Provider Last Rate Last Dose  . acetaminophen (TYLENOL) tablet 650 mg  650 mg Oral Q6H PRN Patrecia Pour, NP      . alum & mag hydroxide-simeth (MAALOX/MYLANTA) 200-200-20 MG/5ML suspension 30 mL  30 mL Oral Q4H PRN Patrecia Pour, NP      . divalproex (DEPAKOTE) DR tablet 500 mg  500 mg Oral BID Patrecia Pour, NP   500 mg at 01/23/16 0841  . hydrOXYzine (ATARAX/VISTARIL) tablet 25 mg  25 mg Oral Q6H PRN Patrecia Pour, NP   25 mg at 01/22/16 2151  . ibuprofen (ADVIL,MOTRIN) tablet 600 mg  600 mg Oral Q8H PRN Patrecia Pour, NP   600 mg at 01/23/16 0843  . lurasidone (LATUDA) tablet 40 mg  40 mg Oral QHS Patrecia Pour, NP   40 mg at 01/22/16 2151  . magnesium hydroxide (MILK OF MAGNESIA) suspension 30 mL  30 mL Oral Daily PRN Patrecia Pour, NP      . nicotine (NICODERM CQ - dosed in mg/24 hours) patch 21 mg  21 mg Transdermal Daily Patrecia Pour, NP   21 mg at 01/23/16 0840  . ondansetron (ZOFRAN) tablet 4 mg  4 mg Oral Q8H PRN Patrecia Pour, NP       PTA Medications: Prescriptions prior to admission  Medication Sig Dispense Refill Last Dose  . divalproex (DEPAKOTE) 500 MG DR tablet Take 500 mg by mouth 2 (two) times daily.   Past Week at Unknown time  . hydrOXYzine (ATARAX/VISTARIL) 25 MG tablet Take 1 tablet (25 mg  total) by mouth every 6 (six) hours as needed for anxiety (sleep). (Patient not taking: Reported on 01/21/2016) 45 tablet 0 Not Taking at Unknown time  . ibuprofen (ADVIL,MOTRIN) 200 MG tablet Take 4 tablets (800 mg total) by mouth every 8 (eight) hours as needed for moderate pain. 30 tablet 0 01/20/2016 at Unknown time  . lurasidone (LATUDA) 40 MG TABS tablet Take 40 mg by mouth at bedtime.   Past Week at Unknown time   Musculoskeletal: Strength &  Muscle Tone: within normal limits Gait & Station: normal Patient leans: N/A  Psychiatric Specialty Exam: Physical Exam  Constitutional: She is oriented to person, place, and time. She appears well-developed and well-nourished.  HENT:  Head: Normocephalic.  Eyes: Pupils are equal, round, and reactive to light.  Neck: Normal range of motion.  Cardiovascular: Normal rate.   Respiratory: Effort normal.  GI: Soft.  Genitourinary:  Denies any issues in this area  Musculoskeletal: Normal range of motion.  Neurological: She is alert and oriented to person, place, and time.  Skin: Skin is warm and dry.  Psychiatric: Her speech is normal and behavior is normal. Thought content normal. Her mood appears anxious. Her affect is not angry, not blunt, not labile and not inappropriate. Cognition and memory are normal. She expresses impulsivity. She exhibits a depressed mood.    Review of Systems  Constitutional: Positive for chills, malaise/fatigue and diaphoresis.  Eyes: Negative.   Respiratory: Negative.   Cardiovascular: Negative.   Gastrointestinal: Positive for nausea.  Genitourinary: Negative.   Musculoskeletal: Positive for myalgias and joint pain.  Skin: Negative.   Neurological: Positive for weakness and headaches.  Endo/Heme/Allergies: Negative.   Psychiatric/Behavioral: Positive for depression and substance abuse (Polysubstance dependence). Negative for hallucinations and memory loss. The patient is nervous/anxious and has insomnia.      Blood pressure 115/78, pulse 87, temperature 98.5 F (36.9 C), temperature source Oral, resp. rate 18, height 5' 11"  (1.803 m), weight 51.256 kg (113 lb), last menstrual period 12/23/2015.Body mass index is 15.77 kg/(m^2).  General Appearance: Casual, thin frame  Eye Contact::  Fair  Speech:  Clear, coherent, not spontaneous  Volume:  Decreased  Mood:  Depressed  Affect:  Congruent and Flat  Thought Process:  Intact and Logical  Orientation:  Full (Time, Place, and Person)  Thought Content:  Ruminations, denies any hallucinations  Suicidal Thoughts:  No  Homicidal Thoughts:  No  Memory:  Immediate;   Good Recent;   Good Remote;   Good  Judgement:  Fair  Insight:  Present  Psychomotor Activity:  Decreased  Concentration:  Fair  Recall:  Good  Fund of Knowledge:Fair  Language: Good  Akathisia:  No  Handed:  Right  AIMS (if indicated):     Assets:  Communication Skills Desire for Improvement  ADL's:  Intact  Cognition: WNL  Sleep:  Number of Hours: 4.25   Treatment Plan/Recommendations: 1. Admit for crisis management and stabilization, estimated length of stay 3-5 days.  2. Medication management to reduce current symptoms to base line and improve the patient's overall level of functioning; Discontinue Depakote due to low Platelet, initiate Lamictal 25 mg for mood stability, Latuda 40 mg for mood control, Librium 25 mg prn for acute withdrawal symptoms, Remeron 15 mg for depression/insomnia, Vistaril 25 mg prn for anxiety..,  3. Treat health problems as indicated.  4. Develop treatment plan to decrease risk of relapse upon discharge and the need for readmission.  5. Psycho-social education regarding relapse prevention and self care.  6. Health care follow up as needed for medical problems.  7. Review, reconcile, and reinstate any pertinent home medications for other health issues where appropriate. 8. Call for consults with hospitalist for any additional specialty patient care  services as needed.  Observation Level/Precautions:  15 minute checks  Laboratory:  Per ED, UDS (+) for Benzodiazepine, Cocaine & THC  Psychotherapy: Group sessions   Medications: Discontinue Depakote due to low Platelet, initiate Lamictal 25 mg for mood stability, Latuda 40  mg for mood control, Librium 25 mg prn for acute withdrawal symptoms, Remeron 15 mg for insomnia/depression  Consultations: As needed    Discharge Concerns: Safety, mood stability  Estimated LOS: 2-4 days  Other: Admit to the [id number]   I certify that inpatient services furnished can reasonably be expected to improve the patient's condition.    Encarnacion Slates, NP, PMHNP, FNP-BC 4/1/20179:34 AM

## 2016-01-24 LAB — CBC WITH DIFFERENTIAL/PLATELET
BASOS PCT: 0 %
Basophils Absolute: 0 10*3/uL (ref 0.0–0.1)
Eosinophils Absolute: 0.1 10*3/uL (ref 0.0–0.7)
Eosinophils Relative: 1 %
HEMATOCRIT: 39.6 % (ref 36.0–46.0)
HEMOGLOBIN: 13.5 g/dL (ref 12.0–15.0)
LYMPHS ABS: 2.4 10*3/uL (ref 0.7–4.0)
LYMPHS PCT: 51 %
MCH: 29.9 pg (ref 26.0–34.0)
MCHC: 34.1 g/dL (ref 30.0–36.0)
MCV: 87.8 fL (ref 78.0–100.0)
MONOS PCT: 11 %
Monocytes Absolute: 0.5 10*3/uL (ref 0.1–1.0)
NEUTROS ABS: 1.7 10*3/uL (ref 1.7–7.7)
NEUTROS PCT: 37 %
Platelets: 141 10*3/uL — ABNORMAL LOW (ref 150–400)
RBC: 4.51 MIL/uL (ref 3.87–5.11)
RDW: 12.5 % (ref 11.5–15.5)
WBC: 4.6 10*3/uL (ref 4.0–10.5)

## 2016-01-24 MED ORDER — ENSURE ENLIVE PO LIQD
237.0000 mL | Freq: Two times a day (BID) | ORAL | Status: DC
  Administered 2016-01-24 – 2016-01-25 (×3): 237 mL via ORAL

## 2016-01-24 NOTE — Progress Notes (Signed)
Patient did attend the evening speaker AA meeting.  

## 2016-01-24 NOTE — Plan of Care (Signed)
Problem: Diagnosis: Increased Risk For Suicide Attempt Goal: STG-Patient Will Attend All Groups On The Unit Outcome: Progressing Pt attended evening group on 01/24/16     

## 2016-01-24 NOTE — Progress Notes (Signed)
Patient ID: Natalie Fischer, female   DOB: [birth year], 35 y.o.   MRN: [id number]   Pt currently presents with a flat affect and depressed behavior. PT forwards little to Clinical research associatewriter. Per self inventory, pt rates depression at a 3, hopelessness 3 and anxiety 5. Pt's daily goal is to "not being depressed and tired" and they intend to do so by "think positive." Pt reports fair sleep, a fair appetite, low energy and good concentration. Pt remains in bed for most of the day today.   Pt provided with medications per providers orders. Pt's labs and vitals were monitored throughout the day. Pt supported emotionally and encouraged to express concerns and questions. Pt educated on medications and nutritional supplements.  Pt's safety ensured with 15 minute and environmental checks. Pt currently denies SI/HI and A/V hallucinations. Pt verbally agrees to seek staff if SI/HI or A/VH occurs and to consult with staff before acting on these thoughts. Will continue POC.

## 2016-01-24 NOTE — Progress Notes (Addendum)
[facility] MD Progress Note  01/24/2016 12:45 PM Natalie Fischer  MRN:  [id number] Subjective:  Pt states ' I still have some SI , I am also depressed. I am in pain due to my cycle."  Objective:Natalie Fischer is a 35 year old AAF, who has a hx of bipolar do as well as substance abuse , who presented with SI and worsening depression. Patient seen and chart reviewed.Discussed patient with treatment team.  Pt today is seen as depressed, withdrawn , in bed , states she continues to be suicidal. Pt also has abdominal pain which is making her more worried, reports she has her menstrual period. Pt otherwise denies any concerns. Will continue to monitor.        Principal Problem: Bipolar 1 disorder, depressed, moderate (HCC) Diagnosis:   Patient Active Problem List   Diagnosis Date Noted  . 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 Cocaine abuse [F14.10] 01/22/2016  . Bipolar 1 disorder, depressed, moderate (HCC) [F31.32] 02/20/2015  . Cocaine use disorder, severe, dependence (HCC) [F14.20]   . Cannabis use disorder, moderate, dependence (HCC) [F12.20]   . ANEMIA [D64.9] 05/11/2010  . LOSS OF WEIGHT [R63.4] 03/07/2008  . ALLERGIC RHINITIS [J30.9] 01/18/2008  . INSOMNIA [G47.00] 01/18/2008  . ANXIETY [F41.1] 08/31/2007  . GERD [K21.9] 06/18/2007  . CONSTIPATION [K59.00] 06/18/2007   Total Time spent with patient: 30 minutes  Past Psychiatric History: Please see H&P.   Past Medical History:  Past Medical History  Diagnosis Date  . GERD (gastroesophageal reflux disease)   . Ectopic pregnancy 2008 and 2014    2008 required surgery, 2nd Misoprostol  . Bipolar 1 disorder (HCC)   . UTI (lower urinary tract infection)   . Trichomonas vaginitis   . Chlamydia   . Anxiety   . Insomnia   . Constipation   . Carpal tunnel syndrome of right wrist 2016    Past Surgical History  Procedure Laterality Date  . Ectopic pregnancy surgery Right 2008   Family History:  Family History   Problem Relation Age of Onset  . Cancer Sister 32  . Alcohol abuse Neg Hx   . Arthritis Neg Hx   . Asthma Neg Hx   . Birth defects Neg Hx   . Depression Neg Hx   . Diabetes Neg Hx   . Drug abuse Neg Hx   . Early death Neg Hx   . Hearing loss Neg Hx   . Heart disease Neg Hx   . Hyperlipidemia Neg Hx   . Hypertension Neg Hx   . Kidney disease Neg Hx   . Learning disabilities Neg Hx   . Mental illness Neg Hx   . Mental retardation Neg Hx   . Miscarriages / Stillbirths Neg Hx   . Stroke Neg Hx   . Vision loss Neg Hx   . Varicose Veins Neg Hx   . COPD Sister    Family Psychiatric  History: Please see H&P.  Social History:  History  Alcohol Use  . 0.0 oz/week  . 0 Standard drinks or equivalent per week    Comment: occassional     History  Drug Use  . Yes  . Special: Cocaine    Comment: Xanax    Social History   Social History  . Marital Status: Single    Spouse Name: N/A  . Number of Children: 0  . Years of Education: college-1   Occupational History  . unemployed     Previously worked [employer],  customer service.   Social History Main Topics  . Smoking status: Current Some Day Smoker -- 0.25 packs/day    Types: Cigarettes    Start date: 05/15/1999    Last Attempt to Quit: 04/12/2014  . Smokeless tobacco: Never Used  . Alcohol Use: 0.0 oz/week    0 Standard drinks or equivalent per week     Comment: occassional  . Drug Use: Yes    Special: Cocaine     Comment: Xanax  . Sexual Activity: No   Other Topics Concern  . None   Social History Narrative   [place]   [place] when her mother got a job in one of the mills here.   Now living with her father.  Sometimes with her sister.   Parents separated.      Additional Social History:                         Sleep: Fair  Appetite:  Poor  Current Medications: Current Facility-Administered Medications  Medication Dose Route Frequency  Provider Last Rate Last Dose  . acetaminophen (TYLENOL) tablet 650 mg  650 mg Oral Q6H PRN Charm RingsJamison Y Lord, NP      . alum & mag hydroxide-simeth (MAALOX/MYLANTA) 200-200-20 MG/5ML suspension 30 mL  30 mL Oral Q4H PRN Charm RingsJamison Y Lord, NP   30 mL at 01/23/16 1719  . chlordiazePOXIDE (LIBRIUM) capsule 25 mg  25 mg Oral QID PRN Jomarie LongsSaramma Leara Rawl, MD      . feeding supplement (ENSURE ENLIVE) (ENSURE ENLIVE) liquid 237 mL  237 mL Oral BID BM Rachael FeeIrving A Lugo, MD   237 mL at 01/24/16 1000  . hydrOXYzine (ATARAX/VISTARIL) tablet 25 mg  25 mg Oral Q6H PRN Charm RingsJamison Y Lord, NP   25 mg at 01/23/16 2151  . ibuprofen (ADVIL,MOTRIN) tablet 600 mg  600 mg Oral Q8H PRN Charm RingsJamison Y Lord, NP   600 mg at 01/24/16 0855  . lamoTRIgine (LAMICTAL) tablet 25 mg  25 mg Oral Daily Sanjuana KavaAgnes I Nwoko, NP   25 mg at 01/24/16 0854  . lurasidone (LATUDA) tablet 40 mg  40 mg Oral Q supper Sanjuana KavaAgnes I Nwoko, NP   40 mg at 01/23/16 2150  . magnesium hydroxide (MILK OF MAGNESIA) suspension 30 mL  30 mL Oral Daily PRN Charm RingsJamison Y Lord, NP      . mirtazapine (REMERON) tablet 15 mg  15 mg Oral QHS Sanjuana KavaAgnes I Nwoko, NP   15 mg at 01/23/16 2150  . nicotine (NICODERM CQ - dosed in mg/24 hours) patch 21 mg  21 mg Transdermal Daily Charm RingsJamison Y Lord, NP   21 mg at 01/23/16 0840  . ondansetron (ZOFRAN) tablet 4 mg  4 mg Oral Q8H PRN Charm RingsJamison Y Lord, NP        Lab Results:  Results for orders placed or performed during the hospital encounter of 01/22/16 (from the past 48 hour(s))  CBC with Differential/Platelet     Status: Abnormal   Collection Time: 01/24/16  6:30 AM  Result Value Ref Range   WBC 4.6 4.0 - 10.5 K/uL   RBC 4.51 3.87 - 5.11 MIL/uL   Hemoglobin 13.5 12.0 - 15.0 g/dL   HCT 82.939.6 56.236.0 - 13.046.0 %   MCV 87.8 78.0 - 100.0 fL   MCH 29.9 26.0 - 34.0 pg   MCHC 34.1 30.0 - 36.0 g/dL   RDW 86.512.5 78.411.5 - 69.615.5 %  Platelets 141 (L) 150 - 400 K/uL   Neutrophils Relative % 37 %   Neutro Abs 1.7 1.7 - 7.7 K/uL   Lymphocytes Relative 51 %   Lymphs Abs 2.4 0.7 -  4.0 K/uL   Monocytes Relative 11 %   Monocytes Absolute 0.5 0.1 - 1.0 K/uL   Eosinophils Relative 1 %   Eosinophils Absolute 0.1 0.0 - 0.7 K/uL   Basophils Relative 0 %   Basophils Absolute 0.0 0.0 - 0.1 K/uL    Comment: Performed at [facility]    Blood Alcohol level:  Lab Results  Component Value Date   [facility] <5 01/21/2016   ETH <5 02/18/2015    Physical Findings: AIMS: Facial and Oral Movements Muscles of Facial Expression: None, normal Lips and Perioral Area: None, normal Jaw: None, normal Tongue: None, normal,Extremity Movements Upper (arms, wrists, hands, fingers): None, normal Lower (legs, knees, ankles, toes): None, normal, Trunk Movements Neck, shoulders, hips: None, normal, Overall Severity Severity of abnormal movements (highest score from questions above): None, normal Incapacitation due to abnormal movements: None, normal Patient's awareness of abnormal movements (rate only patient's report): No Awareness, Dental Status Current problems with teeth and/or dentures?: No Does patient usually wear dentures?: No  CIWA:  CIWA-Ar Total: 3 COWS:     Musculoskeletal: Strength & Muscle Tone: within normal limits Gait & Station: normal Patient leans: N/A  Psychiatric Specialty Exam: Review of Systems  Gastrointestinal: Positive for abdominal pain.  Psychiatric/Behavioral: Positive for depression and substance abuse. The patient is nervous/anxious.   All other systems reviewed and are negative.   Blood pressure 102/76, pulse 85, temperature 98.6 F (37 C), temperature source Oral, resp. rate 16, height  (1.803 m), weight 51.256 kg (113 lb), last menstrual period 12/23/2015.Body mass index is 15.77 kg/(m^2).  General Appearance: Fairly Groomed  Patent attorney::  Minimal  Speech:  Slow  Volume:  Decreased  Mood:  Anxious and Depressed  Affect:  Depressed  Thought Process:  Goal Directed  Orientation:  Full (Time, Place, and Person)  Thought  Content:  Rumination  Suicidal Thoughts:  Yes.  without intent/plan  Homicidal Thoughts:  No  Memory:  Immediate;   Fair Recent;   Fair Remote;   Fair  Judgement:  Fair  Insight:  Shallow  Psychomotor Activity:  Normal  Concentration:  Poor  Recall:  [employer] of Knowledge:Fair  Language: Fair  Akathisia:  No  Handed:  Right  AIMS (if indicated):     Assets:  Desire for Improvement  ADL's:  Intact  Cognition: WNL  Sleep:  Number of Hours: 6.5   Treatment Plan Summary::Natalie Fischer is a 35 year old AAF, who has a hx of bipolar do as well as substance abuse , who presented with SI and worsening depression.Pt continues to be depressed and will benefit from inpatient stay. Daily contact with patient to assess and evaluate symptoms and progress in treatment and Medication management  Reviewed past medical records,treatment plan.  Pt to be started on CIWA/librium prn for BZD withdrawal sx. Will continue Lamictal 25 mg po daily for mood sx. Depakote was discontinued due to low platelet count . Repeat CBC shows platelet as uptrending. Will continue Latuda 40 mg po daily with supper for mood sx. CSW will continue working on disposition.  Patient to participate in therapeutic milieu .    Natalie Yanda, MD 01/24/2016, 12:45 PM

## 2016-01-24 NOTE — [facility] Group Notes (Signed)
[facility] Group Notes: (Clinical Social Work)   01/24/2016      Type of Therapy:  Group Therapy   Participation Level:  Did Not Attend despite MHT prompting   Marckus Hanover Grossman-Orr, LCSW 01/24/2016, 12:24 PM     

## 2016-01-24 NOTE — Progress Notes (Signed)
Patient ID: Natalie Fischer, female   DOB: 24-Apr-1981, 35 y.o.   MRN: [id number]  Adult Psychoeducational Group Note  Date:  01/24/2016 Time:  1:45pm   Group Topic/Focus:  Identifying Needs:   The focus of this group is to help patients identify their personal needs that have been historically problematic and identify healthy behaviors to address their needs.  Participation Level:  Did Not Attend  Participation Quality: n/a  Affect: n/a  Cognitive: n/a  Insight:n/a  Engagement in Group:  n/a  Modes of Intervention:  Activity, Discussion, Education and Support  Additional Comments:  Pt did not attend group, pt in bed asleep.   Aurora Maskwyman, Rylee Nuzum E 01/24/2016, 2:21 PM

## 2016-01-24 NOTE — Progress Notes (Signed)
NUTRITION ASSESSMENT  Pt identified as at risk on the Malnutrition Screen Tool  INTERVENTION: 1. Supplements: Ensure Enlive po BID, each supplement provides 350 kcal and 20 grams of protein  NUTRITION DIAGNOSIS: Unintentional weight loss related to sub-optimal intake as evidenced by pt report.   Goal: Pt to meet >/= 90% of their estimated nutrition needs.  Monitor:  PO intake  Assessment:  Pt admitted with substance abuse (cocaine). Pt reports depression after resuming use of cocaine and she stopped eating. Reports losing 20-30 lb in 2 weeks. Per weight history, pt has lost 9 lb since October 2016, insignificant for time frame. Pt is underweight with BMI of 15.7. RD to order Ensure supplements for patient.   Height: Ht Readings from Last 1 Encounters:  01/22/16 5\' 11"  (1.803 m)    Weight: Wt Readings from Last 1 Encounters:  01/22/16 113 lb (51.256 kg)    Weight Hx: Wt Readings from Last 10 Encounters:  01/22/16 113 lb (51.256 kg)  12/18/15 117 lb 8 oz (53.298 kg)  08/16/15 122 lb (55.339 kg)  02/27/15 125 lb (56.7 kg)  01/08/15 121 lb (54.885 kg)  10/07/14 133 lb (60.328 kg)  08/28/14 131 lb 3.2 oz (59.512 kg)  08/20/14 133 lb (60.328 kg)  04/01/14 118 lb 6.4 oz (53.706 kg)  03/14/14 114 lb (51.71 kg)    BMI:  Body mass index is 15.77 kg/(m^2). Pt meets criteria for underweight based on current BMI.  Estimated Nutritional Needs: Kcal: 25-30 kcal/kg Protein: > 1 gram protein/kg Fluid: 1 ml/kcal  Diet Order: Diet Heart Room service appropriate?: Yes; Fluid consistency:: Thin Pt is also offered choice of unit snacks mid-morning and mid-afternoon.  Pt is eating as desired.   Lab results and medications reviewed.   Natalie FrancoLindsey Josuel Koeppen, MS, RD, LDN Pager: [phone] After Hours Pager: [phone]

## 2016-01-24 NOTE — Progress Notes (Signed)
D: Pt was in bed in her room upon initial approach.  Pt has depressed, anxious affect and mood.  She reports her goal is to "stay focused" and that she "slept all day."  Pt reports passive SI without a plan.  She verbally contracts for safety.  Pt denies HI, denies hallucinations, denies pain, denies withdrawal symptoms.  Pt has been visible in milieu interacting with peers and staff appropriately.  Pt attended evening group.  Pt reports feeling like she was "going to throw up" after group.  She was tearful while on the phone talking to her mother, reporting that she asked her mother to bring her clothes and her mother still has not brought them.  A: Introduced self to pt.  Actively listened to pt.  Met with pt and offered support and encouragement.   Medications administered per order.  PRN medication administered for anxiety and nausea.  Ginger ale provided for nausea.  PO fluids encouraged and provided.   R: Pt is compliant with medications.  Pt verbally contracts for safety and reports that she will inform staff of needs and concerns.  Will continue to monitor and assess.

## 2016-01-25 DIAGNOSIS — R45851 Suicidal ideations: Secondary | ICD-10-CM

## 2016-01-25 MED ORDER — PROMETHAZINE HCL 25 MG PO TABS
25.0000 mg | ORAL_TABLET | Freq: Four times a day (QID) | ORAL | Status: DC | PRN
Start: 2016-01-25 — End: 2016-01-27

## 2016-01-25 MED ORDER — IBUPROFEN 600 MG PO TABS: 600.0000 mg | ORAL_TABLET | Freq: Three times a day (TID) | ORAL | Status: DC | PRN

## 2016-01-25 MED ORDER — PANTOPRAZOLE SODIUM 20 MG PO TBEC
20.0000 mg | DELAYED_RELEASE_TABLET | Freq: Every day | ORAL | Status: DC
  Administered 2016-01-25 – 2016-01-27 (×3): 20 mg via ORAL
  Filled 2016-01-25 (×2): qty 1
  Filled 2016-01-25: qty 14
  Filled 2016-01-25 (×3): qty 1

## 2016-01-25 MED ORDER — ENSURE ENLIVE PO LIQD
237.0000 mL | Freq: Three times a day (TID) | ORAL | Status: DC
  Administered 2016-01-25 – 2016-01-27 (×4): 237 mL via ORAL
  Filled 2016-01-25: qty 24

## 2016-01-25 NOTE — [facility] Group Notes (Signed)
[facility] LCSW Group Therapy  01/25/2016 1:08 PM  Type of Therapy:  Group Therapy  Participation Level:  Pt was called out by MD--excused.   Modes of Intervention:  Confrontation, Discussion, Education, Exploration, Problem-solving, Rapport Building, Socialization and Support  Summary of Progress/Problems: Today's Topic: Overcoming Obstacles. Patients identified one short term goal and potential obstacles in reaching this goal. Patients processed barriers involved in overcoming these obstacles. Patients identified steps necessary for overcoming these obstacles and explored motivation (internal and external) for facing these difficulties head on.   Smart,  Mcnicholas LCSW 01/25/2016, 1:08 PM

## 2016-01-25 NOTE — Tx Team (Signed)
Interdisciplinary Treatment Plan Update (Adult)  Date:  01/25/2016  Time Reviewed:  8:55 AM   Progress in Treatment: Attending groups: Yes. Participating in groups:  Yes. Taking medication as prescribed:  Yes. Tolerating medication:  Yes. Family/Significant othe contact made:  SPE required for this pt.  Patient understands diagnosis:  Yes. and As evidenced by:  seeking treatment for xanax/cocaine abuse, depression, SI with plan, and for medication stabilization. Discussing patient identified problems/goals with staff:  Yes. Medical problems stabilized or resolved:  Yes. Denies suicidal/homicidal ideation: Yes. Issues/concerns per patient self-inventory:  Other:  Discharge Plan or Barriers: CSW assessing for appropriate referrals. Pt is hoping for [facility] or [facility] and referrals have been faxed by CSW. Pt also interested in learning more about [facility]. She currently sees Roxan Diesel for therapy at [facility] and has been going there for medication management "but no recently."   Reason for Continuation of Hospitalization: Depression Medication stabilization Withdrawal symptoms  Comments:  Natalie Fischer is an 35 y.o. female.  Patient has had increasing depression. She has been thinking about cutting her wrists to kill herself.Patient reports that she called [facility] because she was feeling hopeless and was having increasing thoughts of using a razor to cut her wrists. She has she intention to kill herself. Patient denies any previous suicide attempts. Patient denies any HI or auditory hallucinations. She says that she sees bugs and spiders all the time but does not appear to be bothered by it.Patient is using xanax and cocaine. She uses about an 8-ball of cocaine 5 times a week and will use xanax to come down off the crack. Patient has had some thoughts of overdosing on xanax. Patient is unclear about exactly how much of  xanax she uses, saying "a piece of a bar." Patient says when she gets around certain people she starts using.Pt is inconsistent with using her psychiatric medications she gets from [facility]. She does not know when her next appointment is. Patient was at [facility] in April of 2016 for SI. Diagnosis: MDD, recurrent w/ psychotic features; Cocaine use d/o severe  Estimated length of stay:  3-5 days   New goal(s): to develop appropriate aftercare plan.   Additional Comments:  Patient and CSW reviewed pt's identified goals and treatment plan. Patient verbalized understanding and agreed to treatment plan. CSW reviewed [facility] "Discharge Process and Patient Involvement" Form. Pt verbalized understanding of information provided and signed form.    Review of initial/current patient goals per problem list:  1. Goal(s): Patient will participate in aftercare plan  Met: No.   Target date: at discharge  As evidenced by: Patient will participate within aftercare plan AEB aftercare provider and housing plan at discharge being identified.  4/3: CSW assessing. Referrals faxed to [facility] and [facility].   2. Goal (s): Patient will exhibit decreased depressive symptoms and suicidal ideations.  Met: No.    Target date: at discharge  As evidenced by: Patient will utilize self rating of depression at 3 or below and demonstrate decreased signs of depression or be  deemed stable for discharge by MD.   4/3: Pt rates depression as 5/10 and presents with depressed mood/flat affect. Denies SI/HI/AVH.  3. Goal(s): Patient will demonstrate decreased signs of withdrawal due to substance abuse  Met:No.   Target date:at discharge   As evidenced by: Patient will produce a CIWA/COWS score of 0, have stable vitals signs, and no symptoms of withdrawal.  4/3: Pt reports mild/moderate withdrawals with  no COWs and stable vitals. Goal progressing.   Attendees: Patient:   01/25/2016 8:55 AM   Family:   01/25/2016  8:55 AM   Physician:  Dr. Carlton Adam, MD 01/25/2016 8:55 AM   Nursing:   Leisa Lenz RN 01/25/2016 8:55 AM   Clinical Social Worker: Maxie Better, LCSW 01/25/2016 8:55 AM   Clinical Social Worker: Erasmo Downer Drinkard LCSW 01/25/2016 8:55 AM   Other:  Gerline Legacy Nurse Case Manager 01/25/2016 8:55 AM   Other:  Agustina Caroli NP 01/25/2016 8:55 AM   Other:   01/25/2016 8:55 AM   Other:  01/25/2016 8:55 AM   Other:  01/25/2016 8:55 AM   Other:  01/25/2016 8:55 AM    01/25/2016 8:55 AM    01/25/2016 8:55 AM    01/25/2016 8:55 AM    01/25/2016 8:55 AM    Scribe for Treatment Team:   Maxie Better, LCSW 01/25/2016 8:55 AM

## 2016-01-25 NOTE — [facility] Group Notes (Signed)
[facility]   01/25/2016 10:47 AM  Participation Quality:  Appropriate   Mood/Affect:  Appropriate  Depression Rating:  5  Anxiety Rating:  9  Thoughts of Suicide:  No Will you contract for safety?   NA  Current AVH:  No  Plan for Discharge/Comments:  Pt reports that she slept well and reports mild withdrawals. Pt is [facility] for [name] Referrals-CSW assessing. She goes to [name] currently for medication management and counseling with [facility].   Transportation Means: unknown at this time.   Supports: pt's mother   Smart, [name] LCSW

## 2016-01-25 NOTE — Progress Notes (Signed)
Patient ID: Natalie Fischer, female   DOB: 14-Jun-1981, 35 y.o.   MRN: [id number]   Pt currently presents with a flat affect and anxious behavior. Per self inventory, pt rates depression at a 5, hopelessness 4 and anxiety 0. Pt's daily goal is to "concentrating on why I am here" and they intend to do so by "stay focus attend meetings, groups." Pt reports good sleep, a fair appetite, normal energy and good concentration.   Pt provided with medications per providers orders. Pt's labs and vitals were monitored throughout the day. Pt supported emotionally and encouraged to express concerns and questions. Pt educated on medications and alternative stress reduction techniques.   Pt's safety ensured with 15 minute and environmental checks. Pt currently denies SI/HI and A/V hallucinations. Pt verbally agrees to seek staff if SI/HI or A/VH occurs and to consult with staff before acting on these thoughts. Pt writes "No, but thanks for all of their help." Will continue POC.

## 2016-01-25 NOTE — Progress Notes (Signed)
Recreation Therapy Notes  Date: 04.03.2017 Time: 9:30am Location: [address] Dayroom   Group Topic: Stress Management  Goal Area(s) Addresses:  Patient will actively participate in stress management techniques presented during session.   Behavioral Response: Engaged, Appropriate   Intervention: Stress management techniques  Activity : Deep Breathing, Progressive Body Relaxation and Progressive Body Scan. LRT provided education, instruction and demonstration on practice of Deep Breathing, Progressive Body Relaxation and Progressive Body Scan. Patient was asked to participate in technique introduced during session.   Education:  Stress Management, Discharge Planning.   Education Outcome: Acknowledges education  Clinical Observations/Feedback: Patient actively engaged in technique introduced, expressed no concerns and demonstrated ability to practice independently post d/c.   Natalie Fischer, LRT/CTRS        Natalie Fischer 01/25/2016 3:04 PM 

## 2016-01-25 NOTE — Progress Notes (Signed)
[facility]  01/25/2016 1:53 PM Natalie Fischer  MRN:  [id number] Subjective:  Still states she is feeling very overwhelmed. She has lost a lot of weight. States that her cocaine has probably lot to do with it. States she is having nausea and has vomited. The Zofran she thinks is not doing anything. She is worried about the weight loss states people are noticing and making comments Principal Problem: Bipolar 1 disorder, depressed, moderate (HCC) Diagnosis:   Patient Active Problem List   Diagnosis Date Noted  . 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 Cocaine abuse [F14.10] 01/22/2016  . Bipolar 1 disorder, depressed, moderate (HCC) [F31.32] 02/20/2015  . Cocaine use disorder, severe, dependence (HCC) [F14.20]   . Cannabis use disorder, moderate, dependence (HCC) [F12.20]   . ANEMIA [D64.9] 05/11/2010  . LOSS OF WEIGHT [R63.4] 03/07/2008  . ALLERGIC RHINITIS [J30.9] 01/18/2008  . INSOMNIA [G47.00] 01/18/2008  . ANXIETY [F41.1] 08/31/2007  . GERD [K21.9] 06/18/2007  . CONSTIPATION [K59.00] 06/18/2007   Total Time spent with patient: 30 minutes  Past Psychiatric History: see admission H and P  Past Medical History:  Past Medical History  Diagnosis Date  . GERD (gastroesophageal reflux disease)   . Ectopic pregnancy 2008 and 2014    2008 required surgery, 2nd Misoprostol  . Bipolar 1 disorder (HCC)   . UTI (lower urinary tract infection)   . Trichomonas vaginitis   . Chlamydia   . Anxiety   . Insomnia   . Constipation   . Carpal tunnel syndrome of right wrist 2016    Past Surgical History  Procedure Laterality Date  . Ectopic pregnancy surgery Right 2008   Family History:  Family History  Problem Relation Age of Onset  . Cancer Sister [id number]  . Alcohol abuse Neg Hx   . Arthritis Neg Hx   . Asthma Neg Hx   . Birth defects Neg Hx   . Depression Neg Hx   . Diabetes Neg Hx   . Drug abuse Neg Hx   . Early death Neg Hx   . Hearing loss Neg Hx   .  Heart disease Neg Hx   . Hyperlipidemia Neg Hx   . Hypertension Neg Hx   . Kidney disease Neg Hx   . Learning disabilities Neg Hx   . Mental illness Neg Hx   . Mental retardation Neg Hx   . Miscarriages / Stillbirths Neg Hx   . Stroke Neg Hx   . Vision loss Neg Hx   . Varicose Veins Neg Hx   . COPD Sister    Family Psychiatric  History: see admission H and P Social History:  History  Alcohol Use  . 0.0 oz/week  . 0 Standard drinks or equivalent per week    Comment: occassional     History  Drug Use  . Yes  . Special: Cocaine    Comment: Xanax    Social History   Social History  . Marital Status: Single    Spouse Name: N/A  . Number of Children: 0  . Years of Education: college-1   Occupational History  . unemployed     Previously worked [employer], Clinical biochemistcustomer service.   Social History Main Topics  . Smoking status: Current Some Day Smoker -- 0.25 packs/day    Types: Cigarettes    Start date: 05/15/1999    Last Attempt to Quit: 04/12/2014  . Smokeless tobacco: Never Used  . Alcohol Use: 0.0 oz/week  0 Standard drinks or equivalent per week     Comment: occassional  . Drug Use: Yes    Special: Cocaine     Comment: Xanax  . Sexual Activity: No   Other Topics Concern  . None   Social History Narrative   [place], [place] in 1998 when her mother got a job in one of the [place] here.   Now living with her father.  Sometimes with her sister.   Parents separated.      Additional Social History:                         Sleep: Fair  Appetite:  Poor  Current Medications: Current Facility-Administered Medications  Medication Dose Route Frequency Provider Last Rate Last Dose  . acetaminophen (TYLENOL) tablet 650 mg  650 mg Oral Q6H PRN Charm Rings, NP      . alum & mag hydroxide-simeth (MAALOX/MYLANTA) 200-200-20 MG/5ML suspension 30 mL  30 mL Oral Q4H PRN Charm Rings, NP   30 mL at 01/23/16 1719  .  chlordiazePOXIDE (LIBRIUM) capsule 25 mg  25 mg Oral QID PRN Jomarie Longs, MD   25 mg at 01/24/16 2212  . feeding supplement (ENSURE ENLIVE) (ENSURE ENLIVE) liquid 237 mL  237 mL Oral BID BM Rachael Fee, MD   237 mL at 01/25/16 1000  . hydrOXYzine (ATARAX/VISTARIL) tablet 25 mg  25 mg Oral Q6H PRN Charm Rings, NP   25 mg at 01/25/16 1133  . ibuprofen (ADVIL,MOTRIN) tablet 600 mg  600 mg Oral Q8H PRN Oneta Rack, NP      . lamoTRIgine (LAMICTAL) tablet 25 mg  25 mg Oral Daily Sanjuana Kava, NP   25 mg at 01/25/16 0827  . lurasidone (LATUDA) tablet 40 mg  40 mg Oral Q supper Sanjuana Kava, NP   40 mg at 01/24/16 1700  . magnesium hydroxide (MILK OF MAGNESIA) suspension 30 mL  30 mL Oral Daily PRN Charm Rings, NP   30 mL at 01/25/16 1258  . mirtazapine (REMERON) tablet 15 mg  15 mg Oral QHS Sanjuana Kava, NP   15 mg at 01/24/16 2212  . nicotine (NICODERM CQ - dosed in mg/24 hours) patch 21 mg  21 mg Transdermal Daily Charm Rings, NP   21 mg at 01/23/16 0840  . ondansetron (ZOFRAN) tablet 4 mg  4 mg Oral Q8H PRN Charm Rings, NP   4 mg at 01/25/16 0829  . pantoprazole (PROTONIX) EC tablet 20 mg  20 mg Oral Daily Oneta Rack, NP        Lab Results:  Results for orders placed or performed during the hospital encounter of 01/22/16 (from the past 48 hour(s))  CBC with Differential/Platelet     Status: Abnormal   Collection Time: 01/24/16  6:30 AM  Result Value Ref Range   WBC 4.6 4.0 - 10.5 K/uL   RBC 4.51 3.87 - 5.11 MIL/uL   Hemoglobin 13.5 12.0 - 15.0 g/dL   HCT 16.1 09.6 - 04.5 %   MCV 87.8 78.0 - 100.0 fL   MCH 29.9 26.0 - 34.0 pg   MCHC 34.1 30.0 - 36.0 g/dL   RDW 40.9 81.1 - 91.4 %   Platelets 141 (L) 150 - 400 K/uL   Neutrophils Relative % 37 %   Neutro Abs 1.7 1.7 - 7.7 K/uL  Lymphocytes Relative 51 %   Lymphs Abs 2.4 0.7 - 4.0 K/uL   Monocytes Relative 11 %   Monocytes Absolute 0.5 0.1 - 1.0 K/uL   Eosinophils Relative 1 %   Eosinophils Absolute 0.1 0.0 - 0.7  K/uL   Basophils Relative 0 %   Basophils Absolute 0.0 0.0 - 0.1 K/uL    Comment: Performed at [facility]    Blood Alcohol level:  Lab Results  Component Value Date   [facility] <5 01/21/2016   ETH <5 02/18/2015    Physical Findings: AIMS: Facial and Oral Movements Muscles of Facial Expression: None, normal Lips and Perioral Area: None, normal Jaw: None, normal Tongue: None, normal,Extremity Movements Upper (arms, wrists, hands, fingers): None, normal Lower (legs, knees, ankles, toes): None, normal, Trunk Movements Neck, shoulders, hips: None, normal, Overall Severity Severity of abnormal movements (highest score from questions above): None, normal Incapacitation due to abnormal movements: None, normal Patient's awareness of abnormal movements (rate only patient's report): No Awareness, Dental Status Current problems with teeth and/or dentures?: No Does patient usually wear dentures?: No  CIWA:  CIWA-Ar Total: 5 COWS:     Musculoskeletal: Strength & Muscle Tone: within normal limits Gait & Station: normal Patient leans: normal  Psychiatric Specialty Exam: Review of Systems  Constitutional: Positive for weight loss.  HENT: Negative.        Throbbing   Eyes: Negative.   Respiratory: Negative.   Cardiovascular: Positive for palpitations.  Gastrointestinal: Positive for heartburn, nausea, vomiting and constipation.  Genitourinary: Negative.   Musculoskeletal: Negative.   Skin: Negative.   Endo/Heme/Allergies: Negative.   Psychiatric/Behavioral: Positive for depression, suicidal ideas and substance abuse. The patient is nervous/anxious.     Blood pressure 115/77, pulse 91, temperature 98.6 F (37 C), temperature source Oral, resp. rate 16, height  (1.803 m), weight 51.256 kg (113 lb), last menstrual period 12/23/2015.Body mass index is 15.77 kg/(m^2).  General Appearance: Fairly Groomed  Patent attorney::  Fair  Speech:  Clear and Coherent and Slow   Volume:  Decreased  Mood:  Anxious and Depressed  Affect:  Restricted  Thought Process:  Coherent and Goal Directed  Orientation:  Full (Time, Place, and Person)  Thought Content:  symptoms events worries concerns  Suicidal Thoughts:  Yes no plans or intent  Homicidal Thoughts:  No  Memory:  Immediate;   Fair Recent;   Fair Remote;   Fair  Judgement:  Fair  Insight:  Present and Shallow  Psychomotor Activity:  Decreased  Concentration:  Fair  Recall:  [employer] of Knowledge:Fair  Language: Fair  Akathisia:  No  Handed:  Right  AIMS (if indicated):     Assets:  Desire for Improvement  ADL's:  Intact  Cognition: WNL  Sleep:  Number of Hours: 5.5   Treatment Plan Summary: Daily contact with patient to assess and evaluate symptoms and progress in treatment and Medication management Supportive approach coping skills  Cocaine dependence; work a relapse prevention plan Mood instability; will resume the Latuda and optimize dose response Insomnia; Remeron 15 mg HS Nausea; will use Phenergan as the Zofran is not working Increase the Ensure to TID Work with CBT/mindfulness  Explore residential treatment options Teena Mangus A, MD 01/25/2016, 1:53 PM

## 2016-01-26 MED ORDER — MIRTAZAPINE 30 MG PO TABS
30.0000 mg | ORAL_TABLET | Freq: Every day | ORAL | Status: DC
  Administered 2016-01-26: 30 mg via ORAL
  Filled 2016-01-26 (×2): qty 1
  Filled 2016-01-26: qty 14

## 2016-01-26 NOTE — Plan of Care (Signed)
Problem: Diagnosis: Increased Risk For Suicide Attempt Goal: STG-Patient Will Comply With Medication Regime Outcome: Progressing Pt has been compliant with medications tonight.       

## 2016-01-26 NOTE — Plan of Care (Signed)
Problem: Alteration in mood Goal: LTG-Patient reports reduction in suicidal thoughts (Patient reports reduction in suicidal thoughts and is able to verbalize a safety plan for whenever patient is feeling suicidal)  Outcome: Progressing Pt denies SI and verbally contracts for safety.       

## 2016-01-26 NOTE — Progress Notes (Signed)
Pt did not attend wrap up group meeting.  

## 2016-01-26 NOTE — [facility] Group Notes (Signed)
[facility] LCSW Group Therapy  01/26/2016 2:22 PM  Type of Therapy:  Group Therapy  Participation Level:  Active  Participation Quality:  Attentive  Affect:  Appropriate  Cognitive:  Alert and Oriented  Insight:  Improving  Engagement in Therapy:  Improving  Modes of Intervention:  Confrontation, Discussion, Education, Exploration, Problem-solving, Rapport Building, Socialization and Support  Summary of Progress/Problems: [facility] Speaker came to talk about his personal journey with substance abuse and addiction. The pt processed ways by which to relate to the speaker. [facility]HA speaker provided handouts and educational information pertaining to groups and services offered by [facility].   Smart, Timtohy Broski LCSW 01/26/2016, 2:22 PM

## 2016-01-26 NOTE — Progress Notes (Signed)
Recreation Therapy Notes  Animal-Assisted Activity (AAA) Program Checklist/Progress Notes Patient Eligibility Criteria Checklist & Daily Group note for Rec Tx Intervention  Date: 04.04.2017 Time: 2:45pm [facility] [address]    AAA/T Program Assumption of Risk Form signed by Patient/ or Parent Legal Guardian Yes  Patient is free of allergies or sever asthma Yes  Patient reports no fear of animals Yes  Patient reports no history of cruelty to animals Yes  Patient understands his/her participation is voluntary Yes  Behavioral Response: Did not attend.     Marykay Lexenise Fischer [name], LRT/CTRS        Natalie Fischer 01/26/2016 3:07 PM

## 2016-01-26 NOTE — Progress Notes (Signed)
Patient ID: Natalie Fischer Friedhoff, female   DOB: Aug 15, 1981, 35 y.o.   MRN: [id number] [facility]  01/26/2016 7:08 PM Natalie Fischer [place]  MRN:  [id number] Subjective:  Still states she is feeling very overwhelmed. Still worried about the weight loss. States that her cocaine has probably lot to do with it. States that the nausea is better and she has been able to tolerate small portions of food. She did not sleep as well last night. She has taken higher dosages of Remeron. She still wants to go to rehab as afraid of being out there on her own. Principal Problem: Bipolar 1 disorder, depressed, moderate (HCC) Diagnosis:   Patient Active Problem List   Diagnosis Date Noted  . 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 Cocaine abuse [F14.10] 01/22/2016  . Bipolar 1 disorder, depressed, moderate (HCC) [F31.32] 02/20/2015  . Cocaine use disorder, severe, dependence (HCC) [F14.20]   . Cannabis use disorder, moderate, dependence (HCC) [F12.20]   . ANEMIA [D64.9] 05/11/2010  . LOSS OF WEIGHT [R63.4] 03/07/2008  . ALLERGIC RHINITIS [J30.9] 01/18/2008  . INSOMNIA [G47.00] 01/18/2008  . ANXIETY [F41.1] 08/31/2007  . GERD [K21.9] 06/18/2007  . CONSTIPATION [K59.00] 06/18/2007   Total Time spent with patient: 30 minutes  Past Psychiatric History: see admission H and P  Past Medical History:  Past Medical History  Diagnosis Date  . GERD (gastroesophageal reflux disease)   . Ectopic pregnancy 2008 and 2014    2008 required surgery, 2nd Misoprostol  . Bipolar 1 disorder (HCC)   . UTI (lower urinary tract infection)   . Trichomonas vaginitis   . Chlamydia   . Anxiety   . Insomnia   . Constipation   . Carpal tunnel syndrome of right wrist 2016    Past Surgical History  Procedure Laterality Date  . Ectopic pregnancy surgery Right 2008   Family History:  Family History  Problem Relation Age of Onset  . Cancer Sister [id number]  . Alcohol abuse Neg Hx   . Arthritis Neg Hx   . Asthma  Neg Hx   . Birth defects Neg Hx   . Depression Neg Hx   . Diabetes Neg Hx   . Drug abuse Neg Hx   . Early death Neg Hx   . Hearing loss Neg Hx   . Heart disease Neg Hx   . Hyperlipidemia Neg Hx   . Hypertension Neg Hx   . Kidney disease Neg Hx   . Learning disabilities Neg Hx   . Mental illness Neg Hx   . Mental retardation Neg Hx   . Miscarriages / Stillbirths Neg Hx   . Stroke Neg Hx   . Vision loss Neg Hx   . Varicose Veins Neg Hx   . COPD Sister    Family Psychiatric  History: see admission H and P Social History:  History  Alcohol Use  . 0.0 oz/week  . 0 Standard drinks or equivalent per week    Comment: occassional     History  Drug Use  . Yes  . Special: Cocaine    Comment: Xanax    Social History   Social History  . Marital Status: Single    Spouse Name: N/A  . Number of Children: 0  . Years of Education: college-1   Occupational History  . unemployed     Previously worked [employer], Clinical biochemistcustomer service.   Social History Main Topics  . Smoking status: Current Some Day Smoker -- 0.25 packs/day  Types: Cigarettes    Start date: 05/15/1999    Last Attempt to Quit: 04/12/2014  . Smokeless tobacco: Never Used  . Alcohol Use: 0.0 oz/week    0 Standard drinks or equivalent per week     Comment: occassional  . Drug Use: Yes    Special: Cocaine     Comment: Xanax  . Sexual Activity: No   Other Topics Concern  . None   Social History Narrative   [place], [place] in 1998 when her mother got a job in one of the [place].   Now living with her father.  Sometimes with her sister.   Parents separated.      Additional Social History:                         Sleep: Poor  Appetite:  Poor  Current Medications: Current Facility-Administered Medications  Medication Dose Route Frequency Provider Last Rate Last Dose  . acetaminophen (TYLENOL) tablet 650 mg  650 mg Oral Q6H PRN Charm Rings, NP       . alum & mag hydroxide-simeth (MAALOX/MYLANTA) 200-200-20 MG/5ML suspension 30 mL  30 mL Oral Q4H PRN Charm Rings, NP   30 mL at 01/23/16 1719  . chlordiazePOXIDE (LIBRIUM) capsule 25 mg  25 mg Oral QID PRN Jomarie Longs, MD   25 mg at 01/24/16 2212  . feeding supplement (ENSURE ENLIVE) (ENSURE ENLIVE) liquid 237 mL  237 mL Oral TID BM Rachael Fee, MD   237 mL at 01/26/16 1653  . hydrOXYzine (ATARAX/VISTARIL) tablet 25 mg  25 mg Oral Q6H PRN Charm Rings, NP   25 mg at 01/26/16 1815  . ibuprofen (ADVIL,MOTRIN) tablet 600 mg  600 mg Oral Q8H PRN Oneta Rack, NP      . lamoTRIgine (LAMICTAL) tablet 25 mg  25 mg Oral Daily Sanjuana Kava, NP   25 mg at 01/26/16 0802  . lurasidone (LATUDA) tablet 40 mg  40 mg Oral Q supper Sanjuana Kava, NP   40 mg at 01/26/16 1653  . magnesium hydroxide (MILK OF MAGNESIA) suspension 30 mL  30 mL Oral Daily PRN Charm Rings, NP   30 mL at 01/25/16 1258  . mirtazapine (REMERON) tablet 30 mg  30 mg Oral QHS Rachael Fee, MD      . nicotine (NICODERM CQ - dosed in mg/24 hours) patch 21 mg  21 mg Transdermal Daily Charm Rings, NP   21 mg at 01/23/16 0840  . ondansetron (ZOFRAN) tablet 4 mg  4 mg Oral Q8H PRN Charm Rings, NP   4 mg at 01/25/16 0829  . pantoprazole (PROTONIX) EC tablet 20 mg  20 mg Oral Daily Oneta Rack, NP   20 mg at 01/26/16 0631  . promethazine (PHENERGAN) tablet 25 mg  25 mg Oral Q6H PRN Rachael Fee, MD        Lab Results:  No results found for this or any previous visit (from the past 48 hour(s)).  Blood Alcohol level:  Lab Results  Component Value Date   ETH <5 01/21/2016   ETH <5 02/18/2015    Physical Findings: AIMS: Facial and Oral Movements Muscles of Facial Expression: None, normal Lips and Perioral Area: None, normal Jaw: None, normal Tongue: None, normal,Extremity Movements Upper (arms, wrists, hands, fingers): None, normal Lower (legs, knees, ankles, toes): None, normal,  Trunk Movements Neck,  shoulders, hips: None, normal, Overall Severity Severity of abnormal movements (highest score from questions above): None, normal Incapacitation due to abnormal movements: None, normal Patient's awareness of abnormal movements (rate only patient's report): No Awareness, Dental Status Current problems with teeth and/or dentures?: No Does patient usually wear dentures?: No  CIWA:  CIWA-Ar Total: 0 COWS:     Musculoskeletal: Strength & Muscle Tone: within normal limits Gait & Station: normal Patient leans: normal  Psychiatric Specialty Exam: Review of Systems  Constitutional: Positive for weight loss.  HENT: Negative.        Throbbing   Eyes: Negative.   Respiratory: Negative.   Cardiovascular: Positive for palpitations.  Gastrointestinal: Positive for heartburn, nausea, vomiting and constipation.  Genitourinary: Negative.   Musculoskeletal: Negative.   Skin: Negative.   Endo/Heme/Allergies: Negative.   Psychiatric/Behavioral: Positive for depression, suicidal ideas and substance abuse. The patient is nervous/anxious.     Blood pressure 107/72, pulse 91, temperature 98.1 F (36.7 C), temperature source Oral, resp. rate 16, height  (1.803 m), weight 51.256 kg (113 lb), last menstrual period 12/23/2015.Body mass index is 15.77 kg/(m^2).  General Appearance: Fairly Groomed  Patent attorney::  Fair  Speech:  Clear and Coherent and Slow  Volume:  Decreased  Mood:  Anxious and Depressed  Affect:  Restricted  Thought Process:  Coherent and Goal Directed  Orientation:  Full (Time, Place, and Person)  Thought Content:  symptoms events worries concerns  Suicidal Thoughts:  Yes no plans or intent  Homicidal Thoughts:  No  Memory:  Immediate;   Fair Recent;   Fair Remote;   Fair  Judgement:  Fair  Insight:  Present and Shallow  Psychomotor Activity:  Decreased  Concentration:  Fair  Recall:  [employer] of Knowledge:Fair  Language: Fair  Akathisia:  No  Handed:  Right  AIMS  (if indicated):     Assets:  Desire for Improvement  ADL's:  Intact  Cognition: WNL  Sleep:  Number of Hours: 5.5   Treatment Plan Summary: Daily contact with patient to assess and evaluate symptoms and progress in treatment and Medication management Supportive approach coping skills  Cocaine dependence; work a relapse prevention plan Mood instability; will continue the Latuda and optimize dose response Insomnia; will increase the Remeron to 30 mg HS Nausea; will use Phenergan as the Zofran is not working Increase the Ensure to TID Work with CBT/mindfulness  Explore residential treatment options Ashrith Sagan A, MD 01/26/2016, 7:08 PM

## 2016-01-26 NOTE — Progress Notes (Signed)
D:  Pt was in the day room upon initial approach. Pt has appropriate affect and anxious mood.  Pt has smiled occasionally tonight.  She reports her goal is to "stay focused and concentrate."  Pt reports feeling better because "my mom brought me my stuff today."  Pt reports having nausea earlier today, denies now.  Pt denies SI/HI, denies hallucinations, denies pain, reports withdrawal symptoms of anxiety and "sweats."  Pt has been visible in milieu interacting with peers and staff appropriately.  Pt attended evening group.   A: Actively listened to pt and offered support and encouragement.  Medications administered per order.   R: Pt is compliant with medications.  Pt verbally contracts for safety.  Will continue to monitor and assess.

## 2016-01-26 NOTE — [facility] Group Notes (Signed)
Patient attend group. 

## 2016-01-26 NOTE — Progress Notes (Signed)
D: Pt was in bed in her room upon initial approach.  Pt has appropriate affect and anxious mood.  She reports her day has "been fine" and her goal is to "stay focused."  Pt reports she didn't sleep well last night and her Remeron was increased.  Pt denies SI/HI, denies hallucinations, denies pain.  Pt has stayed in her room for the majority of the night.  She did not attend evening group.   A: Actively listened to pt and offered support and encouragement.  Medications administered per order.  Safety maintained. R: Pt is compliant with medications.  Pt verbally contracts for safety.  Will continue to monitor and assess.

## 2016-01-26 NOTE — [facility] Group Notes (Signed)

## 2016-01-27 MED ORDER — ENSURE ENLIVE PO LIQD: ORAL | Status: DC

## 2016-01-27 MED ORDER — IBUPROFEN 200 MG PO TABS: 800.0000 mg | ORAL_TABLET | Freq: Three times a day (TID) | ORAL | Status: DC | PRN

## 2016-01-27 MED ORDER — PANTOPRAZOLE SODIUM 20 MG PO TBEC: 20.0000 mg | DELAYED_RELEASE_TABLET | Freq: Every day | ORAL | Status: DC

## 2016-01-27 MED ORDER — MIRTAZAPINE 30 MG PO TABS: 30.0000 mg | ORAL_TABLET | Freq: Every day | ORAL | Status: DC

## 2016-01-27 MED ORDER — LAMOTRIGINE 25 MG PO TABS: 25.0000 mg | ORAL_TABLET | Freq: Every day | ORAL | Status: DC

## 2016-01-27 MED ORDER — ENSURE ENLIVE PO LIQD: 237.0000 mL | Freq: Three times a day (TID) | ORAL | Status: DC

## 2016-01-27 MED ORDER — HYDROXYZINE HCL 25 MG PO TABS: 25.0000 mg | ORAL_TABLET | Freq: Four times a day (QID) | ORAL | Status: DC | PRN

## 2016-01-27 MED ORDER — LURASIDONE HCL 40 MG PO TABS: 40.0000 mg | ORAL_TABLET | Freq: Every day | ORAL | Status: DC

## 2016-01-27 MED ORDER — ENSURE ENLIVE PO LIQD
237.0000 mL | ORAL | Status: DC | PRN
  Filled 2016-01-27: qty 24

## 2016-01-27 NOTE — Tx Team (Signed)
Interdisciplinary Treatment Plan Update (Adult)  Date:  01/27/2016  Time Reviewed:  10:52 AM   Progress in Treatment: Attending groups: Yes. Participating in groups:  Yes. Taking medication as prescribed:  Yes. Tolerating medication:  Yes. Family/Significant othe contact made:  SPE completed with pt; contact attempts made with pt's mother.  Patient understands diagnosis:  Yes. and As evidenced by:  seeking treatment for xanax/cocaine abuse, depression, SI with plan, and for medication stabilization. Discussing patient identified problems/goals with staff:  Yes. Medical problems stabilized or resolved:  Yes. Denies suicidal/homicidal ideation: Yes. Issues/concerns per patient self-inventory:  Other:  Discharge Plan or Barriers: Pt accepted to [facility] for today. Referral pending at [facility] and pt goes to [facility] for medication management/counseling. Pt also provided with [facility] information per her request.   Reason for Continuation of Hospitalization: None  Comments:  Natalie Fischer is an 35 y.o. female.  Patient has had increasing depression. She has been thinking about cutting her wrists to kill herself.Patient reports that she called [facility] because she was feeling hopeless and was having increasing thoughts of using a razor to cut her wrists. She has she intention to kill herself. Patient denies any previous suicide attempts. Patient denies any HI or auditory hallucinations. She says that she sees bugs and spiders all the time but does not appear to be bothered by it.Patient is using xanax and cocaine. She uses about an 8-ball of cocaine 5 times a week and will use xanax to come down off the crack. Patient has had some thoughts of overdosing on xanax. Patient is unclear about exactly how much of xanax she uses, saying "a piece of a bar." Patient says when she gets around certain people she starts using.Pt is inconsistent with  using her psychiatric medications she gets from [place]. She does not know when her next appointment is. Patient was at [facility] in April of 2016 for SI. Diagnosis: MDD, recurrent w/ psychotic features; Cocaine use d/o severe  Estimated length of stay:  D/c today   Additional Comments:  Patient and CSW reviewed pt's identified goals and treatment plan. Patient verbalized understanding and agreed to treatment plan. CSW reviewed [facility] "Discharge Process and Patient Involvement" Form. Pt verbalized understanding of information provided and signed form.    Review of initial/current patient goals per problem list:  1. Goal(s): Patient will participate in aftercare plan  Met: Yes  Target date: at discharge  As evidenced by: Patient will participate within aftercare plan AEB aftercare provider and housing plan at discharge being identified.  4/3: CSW assessing. Referrals faxed to [facility] and [facility].   4/5: Pt accepted to [facility] today and will be transported at 1pm.   2. Goal (s): Patient will exhibit decreased depressive symptoms and suicidal ideations.  Met: Yes   Target date: at discharge  As evidenced by: Patient will utilize self rating of depression at 3 or below and demonstrate decreased signs of depression or be  deemed stable for discharge by MD.   4/3: Pt rates depression as 5/10 and presents with depressed mood/flat affect. Denies SI/HI/AVH.   4/5: Pt rates depression as 2/10 and presents with pleasant mood and calm affect.   3. Goal(s): Patient will demonstrate decreased signs of withdrawal due to substance abuse  Met:Yes.   Target date:at discharge   As evidenced by: Patient will produce a CIWA/COWS score of 0, have stable vitals signs, and no symptoms of withdrawal.  4/3: Pt reports mild/moderate withdrawals  with no COWs and stable vitals. Goal progressing.   4/5: Pt reports no signs of withdrawal with COWS of 0 and stable vitals. Goal met.    Attendees: Patient:   01/27/2016 10:52 AM   Family:   01/27/2016 10:52 AM   Physician:  Dr. Carlton Adam, MD 01/27/2016 10:52 AM   Nursing:   Nevada Crane RN  01/27/2016 10:52 AM   Clinical Social Worker: Maxie Better, LCSW 01/27/2016 10:52 AM   Clinical Social Worker: Erasmo Downer Drinkard LCSW 01/27/2016 10:52 AM   Other:  Gerline Legacy Nurse Case Manager 01/27/2016 10:52 AM   Other:  Agustina Caroli NP 01/27/2016 10:52 AM   Other:   01/27/2016 10:52 AM   Other:  01/27/2016 10:52 AM   Other:  01/27/2016 10:52 AM   Other:  01/27/2016 10:52 AM    01/27/2016 10:52 AM    01/27/2016 10:52 AM    01/27/2016 10:52 AM    01/27/2016 10:52 AM    Scribe for Treatment Team:   Maxie Better, LCSW 01/27/2016 10:52 AM

## 2016-01-27 NOTE — Progress Notes (Signed)
  [facility]  Will you be returning to the same living situation after discharge:  No.[facility] At discharge, do you have transportation home?: Yes,  [facility] transport will pick up pt at approximately 1:00PM Do you have the ability to pay for your medications: Yes,  mental health  Release of information consent forms completed and submitted to medical records by CSW.  Patient to Follow up at: Follow-up Information    [facility]   Specialty:  Behavioral Health   Why:  Walk in between 8am-9am Monday through Friday for hospital follow-up/medication management/assessment for counseling services after completing [facility] program.    Contact information:   [address] [place] [address] [phone]       Follow up with [facility] On 01/27/2016.   Why:  [facility]. Driver will pick you up at 1:00PM. Please make sure that you have a 14 day supply of medication.    Contact information:   [address] [place], [place] [address] Phone: [phone] Fax: [phone]      [facility] .   Why:  Referral faxed: 01/26/16. Message left for T[place] requesting screening date. If you are interested in pursuing [facility], please call to schedule screening.    Contact information:   [address] [place], [place] [address] Phone: [phone] Fax: [phone]      Next level of care provider has access to [facility] Link:no  Safety Planning and Suicide Prevention discussed: Yes,  Contact attempts made with pt's mother. SPI pamphlet and mobile crisis information provided to pt and she was encouraged to share this information with her support network.   Have you used any form of tobacco in the last 30 days? (Cigarettes, Smokeless Tobacco, Cigars, and/or Pipes): Yes  Has patient been referred to the Quitline?: Patient refused referral  Patient  has been referred for addiction treatment: Yes-see above.   Smart, Kartier Bennison LCSW 01/27/2016, 10:51 AM

## 2016-01-27 NOTE — [facility] Suicide Risk Assessment (Signed)
[facility] INPATIENT:  Family/Significant Other Suicide Prevention Education  Suicide Prevention Education:  Contact Attempts: Marylyn IshiharaJeanette Brown (pt's mother) [phone] has been identified by the patient as the family member/significant other with whom the patient will be residing, and identified as the person(s) who will aid the patient in the event of a mental health crisis.  With written consent from the patient, two attempts were made to provide suicide prevention education, prior to and/or following the patient's discharge.  We were unsuccessful in providing suicide prevention education.  A suicide education pamphlet was given to the patient to share with family/significant other.  Date and time of first attempt: 01/26/16 at 4:05PM; no answer/voicemail requesting call back.  Date and time of second attempt: 01/27/16 at 10:50AM (no answer/voicemail requesting call back)  Smart, Leith Szafranski LCSW 01/27/2016, 10:50 AM

## 2016-01-27 NOTE — Progress Notes (Signed)
D: [facility]  She will receive a two week supply of medications.  She denies SI/HI/AVH.  Discharge instructions reviewed by patient and she indicated understanding. She rates her depression and hopelessness as a 3; anxiety as a 9.  Her goal is to "stay out of my head and focus on one thing at a time." A: Continue to monitor medication management and MD orders.  Safety checks completed every 15 minutes per protocol.  Offer support and encouragement as needed. R: Patient is receptive to staff; her behavior is appropriate.

## 2016-01-27 NOTE — Progress Notes (Signed)
Discharge note:  Patient discharged per MD order.  Patient received all personal belongings from locker and room.  Reviewed discharge instructions and medications with patient.  She received a 2-week supply of medications and prescriptions.  Patient understood all follow up appointments.  She denies SI/HI/AVH.  Patient left ambulatory with [facility] driver.

## 2016-01-27 NOTE — Progress Notes (Signed)
NUTRITION FOLLOW-UP  RD consulted to see patient for weight loss. Initial assessment completed on 4/02.  INTERVENTION: 1. Educated patient on the importance of nutrition and encouraged intake of food and beverages. 2. Discussed weight goals. 3. Supplements: Continue Ensure Enlive po BID, each supplement provides 350 kcal and 20 grams of protein 4. Provided "High Protein, High Calorie" handout from [employer]. 5. Provided coupons for Ensure and Boost supplements.  NUTRITION DIAGNOSIS: Unintentional weight loss related to sub-optimal intake as evidenced by pt report.   Goal: Pt to meet >/= 90% of their estimated nutrition needs.  Monitor:  PO intake  Assessment:  4/2: Pt admitted with substance abuse (cocaine). Pt reports depression after resuming use of cocaine and she stopped eating. Reports losing 20-30 lb in 2 weeks. Per weight history, pt has lost 9 lb since October 2016, insignificant for time frame. Pt is underweight with BMI of 15.7. RD to order Ensure supplements for patient.   4/5: Patient reports N/V for years d/t issues with gastric reflux. Reviewed foods to avoid to help with reflux. Pt state she is drinking the Ensure supplements and eating a little better. RD provided pt with "High Protein, High Calorie" handout and Ensure and Boost coupons.   Height: Ht Readings from Last 1 Encounters:  01/22/16 5\' 11"  (1.803 m)    Weight: Wt Readings from Last 1 Encounters:  01/22/16 113 lb (51.256 kg)    Weight Hx: Wt Readings from Last 10 Encounters:  01/22/16 113 lb (51.256 kg)  12/18/15 117 lb 8 oz (53.298 kg)  08/16/15 122 lb (55.339 kg)  02/27/15 125 lb (56.7 kg)  01/08/15 121 lb (54.885 kg)  10/07/14 133 lb (60.328 kg)  08/28/14 131 lb 3.2 oz (59.512 kg)  08/20/14 133 lb (60.328 kg)  04/01/14 118 lb 6.4 oz (53.706 kg)  03/14/14 114 lb (51.71 kg)    BMI:  Body mass index is 15.77 kg/(m^2). Pt meets criteria for underweight based on  current BMI.  Estimated Nutritional Needs: Kcal: 25-30 kcal/kg Protein: > 1 gram protein/kg Fluid: 1 ml/kcal  Diet Order: Diet Heart Room service appropriate?: Yes; Fluid consistency:: Thin Pt is also offered choice of unit snacks mid-morning and mid-afternoon.  Pt is eating as desired.   Lab results and medications reviewed.   Tilda FrancoLindsey Aedon Deason, MS, RD, LDN Pager: [phone] After Hours Pager: [phone]

## 2016-01-27 NOTE — Progress Notes (Signed)
Recreation Therapy Notes  Date: 04.04.02017 Time: 9:30am Location: 300 Hall Group Room   Group Topic: Stress Management  Goal Area(s) Addresses:  Patient will actively participate in stress management techniques presented during session.   Behavioral Response: Did not attend.   Paden Kuras L Norberta Stobaugh, LRT/CTRS        Desmin Daleo L 01/27/2016 2:18 PM 

## 2016-01-27 NOTE — Discharge Summary (Signed)
Physician Discharge Summary Note  Patient:  Natalie Fischer is an 35 y.o., female  MRN:  [id number]  DOB:  1980/11/11  Patient phone:  [phone] (home)   Patient address:   [address] [place] [address],   Total Time spent with patient: Greater than 30 minutes  Date of Admission:  01/22/2016  Date of Discharge: 01-27-16  Reason for Admission: Worsening symptoms of bipolar disorder without psychotic features.  Principal Problem: Bipolar 1 disorder, depressed, moderate (HCC) Discharge Diagnoses: Patient Active Problem List   Diagnosis Date Noted  . 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 Cocaine abuse [F14.10] 01/22/2016  . Bipolar 1 disorder, depressed, moderate (HCC) [F31.32] 02/20/2015  . Cocaine use disorder, severe, dependence (HCC) [F14.20]   . Cannabis use disorder, moderate, dependence (HCC) [F12.20]   . ANEMIA [D64.9] 05/11/2010  . LOSS OF WEIGHT [R63.4] 03/07/2008  . ALLERGIC RHINITIS [J30.9] 01/18/2008  . INSOMNIA [G47.00] 01/18/2008  . ANXIETY [F41.1] 08/31/2007  . GERD [K21.9] 06/18/2007  . CONSTIPATION [K59.00] 06/18/2007   Musculoskeletal: Strength & Muscle Tone: within normal limits Gait & Station: normal Patient leans: N/A  Psychiatric Specialty Exam: Physical Exam  Constitutional: She is oriented to person, place, and time. She appears well-developed.  HENT:  Head: Normocephalic.  Eyes: Pupils are equal, round, and reactive to light.  Neck: Normal range of motion.  Cardiovascular: Normal rate.   Respiratory: Effort normal. No stridor.  GI: Soft.  Genitourinary:  Denies any issues in this area  Musculoskeletal: Normal range of motion.  Neurological: She is alert and oriented to person, place, and time.  Skin: Skin is warm.  Psychiatric: Her speech is normal and behavior is normal. Judgment and thought content normal. Her mood appears not anxious. Her affect is not angry, not blunt, not labile and not inappropriate.  Cognition and memory are normal. She does not exhibit a depressed mood.    Review of Systems  Constitutional: Negative.   HENT: Negative.   Eyes: Negative.   Respiratory: Negative.   Cardiovascular: Negative.   Gastrointestinal: Negative.   Genitourinary: Negative.   Musculoskeletal: Negative.   Skin: Negative.   Neurological: Negative.   Endo/Heme/Allergies: Negative.   Psychiatric/Behavioral: Positive for depression (Stable), hallucinations (Hx of) and substance abuse (Cocaine dependence, Cannabis dependence). Negative for suicidal ideas and memory loss. The patient has insomnia (Stable). The patient is not nervous/anxious.     Blood pressure 103/79, pulse 95, temperature 97.9 F (36.6 C), temperature source Oral, resp. rate 16, height 5\' 11"  (1.803 m), weight 51.256 kg (113 lb), last menstrual period 12/23/2015.Body mass index is 15.77 kg/(m^2).  See Md's SRA   Have you used any form of tobacco in the last 30 days? (Cigarettes, Smokeless Tobacco, Cigars, and/or Pipes): Yes   Has this patient used any form of tobacco in the last 30 days? (Cigarettes, Smokeless   Tobacco, Cigars, and/or Pipes) Yes, A prescription for an FDA-approved tobacco cessation medication was offered at discharge and the patient refused  Past Medical History:  Past Medical History  Diagnosis Date  . GERD (gastroesophageal reflux disease)   . Ectopic pregnancy 2008 and 2014    2008 required surgery, 2nd Misoprostol  . Bipolar 1 disorder (HCC)   . UTI (lower urinary tract infection)   . Trichomonas vaginitis   . Chlamydia   . Anxiety   . Insomnia   . Constipation   . Carpal tunnel syndrome of right wrist 2016    Past Surgical History  Procedure Laterality  Date  . Ectopic pregnancy surgery Right 2008   Family History:  Family History  Problem Relation Age of Onset  . Cancer Sister 54  . Alcohol abuse Neg Hx   . Arthritis Neg Hx   . Asthma Neg Hx   . Birth defects Neg Hx   . Depression Neg Hx    . Diabetes Neg Hx   . Drug abuse Neg Hx   . Early death Neg Hx   . Hearing loss Neg Hx   . Heart disease Neg Hx   . Hyperlipidemia Neg Hx   . Hypertension Neg Hx   . Kidney disease Neg Hx   . Learning disabilities Neg Hx   . Mental illness Neg Hx   . Mental retardation Neg Hx   . Miscarriages / Stillbirths Neg Hx   . Stroke Neg Hx   . Vision loss Neg Hx   . Varicose Veins Neg Hx   . COPD Sister    Social History:  History  Alcohol Use  . 0.0 oz/week  . 0 Standard drinks or equivalent per week    Comment: occassional     History  Drug Use  . Yes  . Special: Cocaine    Comment: Xanax    Social History   Social History  . Marital Status: Single    Spouse Name: N/A  . Number of Children: 0  . Years of Education: college-1   Occupational History  . unemployed     Previously worked [employer], Clinical biochemist.   Social History Main Topics  . Smoking status: Current Some Day Smoker -- 0.25 packs/day    Types: Cigarettes    Start date: 05/15/1999    Last Attempt to Quit: 04/12/2014  . Smokeless tobacco: Never Used  . Alcohol Use: 0.0 oz/week    0 Standard drinks or equivalent per week     Comment: occassional  . Drug Use: Yes    Special: Cocaine     Comment: Xanax  . Sexual Activity: No   Other Topics Concern  . None   Social History Narrative   [place], [place], in 1998 when her mother got a job in one of the [place].   Now living with her father.  Sometimes with her sister.   Parents separated.      Risk to Self: Is patient at risk for suicide?: No Risk to Others: No Prior Inpatient Therapy: Yes Prior Outpatient Therapy: Yes  Level of Care:  OP  Hospital Course:  Natalie Fischer is a 35 year old AAF. She was a patient in this hospital sometime last year after receiving mood stabilization treatments for Bipolar affective disorder. Natalie Fischer also has substance abuse issues & was at the [facility]  treatment center for substance abuse treatment in May through June of 2016. This time around, Natalie Fischer is admitted to the [facility] adult unit from the [facility] with complaints of severe depression. During this assessment, she reports, "I called the Therapeutic Services Hot-line on Thursday night for help. I was feeling very depressed, feeling overwhelmed. I had this intense feeling of hurting myself. I recently relapsed on Cocaine. It happened around November of 2016. I have been using since then on daily basis. That got me very depressed, I stopped eating, lost 20 pounds in 2 weeks. I was at the [facility] residential last May, 2016. I got out in June of 2016. I stopped taking my mental  health medicines, stopped following up with my mental health appointments. That was what led to my relapse on cocaine. I also used Xanax to help me come off of cocaine intoxication. I would like to get back on my Remeron, Latuda, Vistaril & Depakote. My mind is constantly racing". A pending DWI charge.  Natalie Fischer was admitted to the adult unit for Suicidal ideations related to worsening symptoms of bipolar disorder & being off of her mental health medications since June of, 2016 . She relapsed on drugs & has been using cocaine on daily basis. She does not however, need detox treatment as cocaine or cannabis has no established detox protocols. But she was put on Librium 25 mg capsules on prn basis for Benzodiazepine withdrawal symptoms. She was in need of resuming her mental health regimen. During her admission assessment, Natalie Fischer was evaluated and her symptoms identified. The Medication regimen for her presenting symptoms were discussed & initiated targeting those presenting symptoms. She was oriented to the unit and encouraged to participate in the unit programming. Her other presenting medical problems (GERD) were identified and treated appropriately. She tolerated her treatment regimen without any significant adverse  effects and or reactions reported.  During Natalie Fischer's hospital stay, she was evaluated daily by a clinical provider to assure her response to her treatment regimen. As the day goes by, improvement was noted by her reports of decreasing symptoms, improved mood, behavior & participation in the unit programming.  She was required on daily basis to complete a self inventory asssessment noting mood, any new symptoms, anxiety and or concerns. Her symptoms responded well to her treatment regimen. Also, being in a therapeutic and supportive environment assisted in her mood stability. Natalie Fischer did present appropriate behavior & was motivated for recovery. She worked closely with the treatment team and case manager to develop a discharge plan with appropriate goals to maintain mood stability after discharge. Coping skills, problem solving as well as relaxation therapies were also part of her unit programming.  On this day of her hospital discharge, Natalie Fischer is in much improved condition than upon admission. Her symptoms were reported as significantly decreased or resolved completely. Upon discharge, she adamantly denies any SI/HI & voiced no AVH. She was motivated to continue taking medication with a goal of continued improvement in mental health. Natalie Fischer is being discharged [facility] [place], [place]. She also a has referral & appointment to the [facility] for further substance abuse treatment after [facility]. And for medication management & routine psychiatric care, she will be receiving these services from [facility]. She was medicated & discharged on; Lamictal 25 mg for mood stabilization, Hydroxyzine 25 mg for anxiety, Mirtazapine 30 mg for depression/anxiety & Latuda 40 mg for mood control. Natalie Fischer [facility], a 14 days worth, supply samples of her [facility] discharge medications. She left [facility] with all personal belongings in no apparent distress. [facility]  [facility].  Consults:  psychiatry  Significant Diagnostic Studies:  labs: CBC with diff, CMP, UDS, toxicology tests, U/A, results reviewed, stable Discharge Vitals:   Blood pressure 103/79, pulse 95, temperature 97.9 F (36.6 C), temperature source Oral, resp. rate 16, height 5\' 11"  (1.803 m), weight 51.256 kg (113 lb), last menstrual period 12/23/2015. Body mass index is 15.77 kg/(m^2). Lab Results:   No results found for this or any previous visit (from the past 72 hour(s)).  Physical Findings: AIMS: Facial and Oral Movements Muscles of Facial Expression: None, normal Lips and Perioral  Area: None, normal Jaw: None, normal Tongue: None, normal,Extremity Movements Upper (arms, wrists, hands, fingers): None, normal Lower (legs, knees, ankles, toes): None, normal, Trunk Movements Neck, shoulders, hips: None, normal, Overall Severity Severity of abnormal movements (highest score from questions above): None, normal Incapacitation due to abnormal movements: None, normal Patient's awareness of abnormal movements (rate only patient's report): No Awareness, Dental Status Current problems with teeth and/or dentures?: No Does patient usually wear dentures?: No  CIWA:  CIWA-Ar Total: 0 COWS:     See Psychiatric Specialty Exam and Suicide Risk Assessment completed by Attending Physician prior to discharge.  Discharge destination:  [facility]  Is patient on multiple antipsychotic therapies at discharge:  No   Has Patient had three or more failed trials of antipsychotic monotherapy by history:  No  Recommended Plan for Multiple Antipsychotic Therapies: NA    Medication List    STOP taking these medications        divalproex 500 MG DR tablet  Commonly known as:  DEPAKOTE     ibuprofen 200 MG tablet  Commonly known as:  ADVIL,MOTRIN      TAKE these medications      Indication   feeding supplement (ENSURE ENLIVE) Liqd  For nutritional supplement when unable to eat max of three per day    Indication:  Nutritional supplement     hydrOXYzine 25 MG tablet  Commonly known as:  ATARAX/VISTARIL  Take 1 tablet (25 mg total) by mouth every 6 (six) hours as needed for anxiety (sleep).   Indication:  Anxiety     lamoTRIgine 25 MG tablet  Commonly known as:  LAMICTAL  Take 1 tablet (25 mg total) by mouth daily. For mood stabilization   Indication:  Mood stabilization     lurasidone 40 MG Tabs tablet  Commonly known as:  LATUDA  Take 1 tablet (40 mg total) by mouth at bedtime. For mood control   Indication:  Mood control     mirtazapine 30 MG tablet  Commonly known as:  REMERON  Take 1 tablet (30 mg total) by mouth at bedtime. For depression   Indication:  Trouble Sleeping, Major Depressive Disorder     pantoprazole 20 MG tablet  Commonly known as:  PROTONIX  Take 1 tablet (20 mg total) by mouth daily. For acid reflux   Indication:  Gastroesophageal Reflux Disease       Follow-up Information    Go to [facility].   Specialty:  Behavioral Health   Why:  Walk in between 8am-9am Monday through Friday for hospital follow-up/medication management/assessment for counseling services after completing [facility] program.    Contact information:   [address] [place] [address] [phone]       Follow up with [facility] On 01/27/2016.   Why:  You have been accepted to [facility] for today. Driver will pick you up at 1:00PM. Please make sure that you have a 14 day supply of medication.    Contact information:   [address] [place], [place] [address] Phone: [phone] Fax: [phone]      Follow up with [facility] .   Why:  Referral faxed: 01/26/16. Message left for Tyshon requesting screening date. If you are interested in pursuing treatment at [facility] after completing program at [facility], please call to schedule screening.    Contact information:   [address] [place], [place] [address] Phone: [phone] Fax: [phone]     Follow-up recommendations: Activity:   As tolerated Diet: As recommended  by your primary care doctor. Keep all scheduled follow-up appointments as recommended.    Comments: Take all your medications as prescribed by your mental healthcare provider. Report any adverse effects and or reactions from your medicines to your outpatient provider promptly. Patient is instructed and cautioned to not engage in alcohol and or illegal drug use while on prescription medicines. In the event of worsening symptoms, patient is instructed to call the crisis hotline, 911 and or go to the nearest ED for appropriate evaluation and treatment of symptoms. Follow-up with your primary care provider for your other medical issues, concerns and or health care needs.   Total Discharge Time: Greater than 30 minutes  Signed: Sanjuana Kava, PMHNP, FNP-BC 01/27/2016, 3:03 PM   I personally assessed the patient and formulated the plan Madie Reno A. Dub Mikes, M.D.

## 2016-01-27 NOTE — [facility]HH Suicide Risk Assessment (Signed)
[facility] Discharge Suicide Risk Assessment   Principal Problem: Bipolar 1 disorder, depressed, moderate (HCC) Discharge Diagnoses:  Patient Active Problem List   Diagnosis Date Noted  . 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 Cocaine abuse [F14.10] 01/22/2016  . Bipolar 1 disorder, depressed, moderate (HCC) [F31.32] 02/20/2015  . Cocaine use disorder, severe, dependence (HCC) [F14.20]   . Cannabis use disorder, moderate, dependence (HCC) [F12.20]   . ANEMIA [D64.9] 05/11/2010  . LOSS OF WEIGHT [R63.4] 03/07/2008  . ALLERGIC RHINITIS [J30.9] 01/18/2008  . INSOMNIA [G47.00] 01/18/2008  . ANXIETY [F41.1] 08/31/2007  . GERD [K21.9] 06/18/2007  . CONSTIPATION [K59.00] 06/18/2007    Total Time spent with patient: 20 minutes  Musculoskeletal: Strength & Muscle Tone: within normal limits Gait & Station: normal Patient leans: normal  Psychiatric Specialty Exam: Review of Systems  Constitutional: Positive for weight loss.  HENT: Negative.   Eyes: Negative.   Respiratory: Negative.   Cardiovascular: Negative.   Gastrointestinal: Negative.   Genitourinary: Negative.   Musculoskeletal: Negative.   Skin: Negative.   Neurological: Negative.   Endo/Heme/Allergies: Negative.   Psychiatric/Behavioral: Positive for depression and substance abuse.    Blood pressure 103/79, pulse 95, temperature 97.9 F (36.6 C), temperature source Oral, resp. rate 16, height  (1.803 m), weight 51.256 kg (113 lb), last menstrual period 12/23/2015.Body mass index is 15.77 kg/(m^2).  General Appearance: Fairly Groomed  Patent attorney::  Fair  Speech:  Clear and Coherent409  Volume:  Decreased  Mood:  Anxious  Affect:  Appropriate  Thought Process:  Coherent and Goal Directed  Orientation:  Full (Time, Place, and Person)  Thought Content:  symptoms events worries concerns  Suicidal Thoughts:  No  Homicidal Thoughts:  No  Memory:  Immediate;   Fair Recent;   Fair Remote;   Fair   Judgement:  Fair  Insight:  Present  Psychomotor Activity:  Normal  Concentration:  Fair  Recall:  [employer] of Knowledge:Fair  Language: Fair  Akathisia:  No  Handed:  Right  AIMS (if indicated):     Assets:  Desire for Improvement Social Support  Sleep:  Number of Hours: 6.75  Cognition: WNL  ADL's:  Intact  In full contact with reality. There are no active S/S of withdrawal. There are no active SI plans or intent. She is willing and motivated to pursue residential treatment to continue to work on abstinence. Plans to pursue her medications Mental Status Per Nursing Assessment::   On Admission:  Self-harm thoughts, Self-harm behaviors  Demographic Factors:  Unemployed  Loss Factors: none identified  Historical Factors: none identified  Risk Reduction Factors:   Sense of responsibility to family  Continued Clinical Symptoms:  Bipolar Disorder:   Depressive phase Alcohol/Substance Abuse/Dependencies  Cognitive Features That Contribute To Risk:  None    Suicide Risk:  Minimal: No identifiable suicidal ideation.  Patients presenting with no risk factors but with morbid ruminations; may be classified as minimal risk based on the severity of the depressive symptoms  Follow-up Information    Go to [facility].   Specialty:  Behavioral Health   Why:  Walk in between 8am-9am Monday through Friday for hospital follow-up/medication management/assessment for counseling services after completing [facility] program.    Contact information:   [address] [place] [address] [phone]       Follow up with [facility] On 01/27/2016.   Why:  You have been accepted to [facility] for today. Driver will pick you up at 1:00PM. Please  make sure that you have a 14 day supply of medication.    Contact information:   [address] [place], [place] [address] Phone: [phone] Fax: [phone]      Follow up with [facility] .   Why:  Referral faxed: 01/26/16. Message left for Tyshon  requesting screening date. If you are interested in pursuing treatment at [facility] after [facility] program at [facility], please call to schedule screening.    Contact information:   [address] [place], [place] [address] Phone: [phone] Fax: [phone]      Plan Of Care/Follow-up recommendations:  Activity:  as tolerated Diet:  regular Follow [facility] at [facility]  Dynver Clemson A, MD 01/27/2016, 12:06 PM

## 2016-02-10 ENCOUNTER — Telehealth: Payer: Self-pay | Admitting: Internal Medicine

## 2016-02-10 NOTE — Telephone Encounter (Signed)
Patient called stating dental clinic has not contacted her to scheduled appointment. Patient informed I will email Samantha at [facility] to get an update about referral and will contact her as soon as [facility] hear [facility]

## 2016-02-11 NOTE — Telephone Encounter (Signed)
Emailed Statisticianamantha at [facility] to get update on referral

## 2016-02-23 ENCOUNTER — Ambulatory Visit: Payer: No Typology Code available for payment source | Admitting: Internal Medicine

## 2016-04-07 ENCOUNTER — Inpatient Hospital Stay (HOSPITAL_COMMUNITY)
Admission: AD | Admit: 2016-04-07 | Discharge: 2016-04-07 | Disposition: A | Discharge status: Home / Self Care | Payer: Self-pay | Source: Ambulatory Visit | Attending: Obstetrics & Gynecology | Admitting: Obstetrics & Gynecology

## 2016-04-07 ENCOUNTER — Encounter (HOSPITAL_COMMUNITY): Payer: Self-pay | Admitting: *Deleted

## 2016-04-07 DIAGNOSIS — N898 Other specified noninflammatory disorders of vagina: Secondary | ICD-10-CM | POA: Insufficient documentation

## 2016-04-07 DIAGNOSIS — K219 Gastro-esophageal reflux disease without esophagitis: Secondary | ICD-10-CM | POA: Insufficient documentation

## 2016-04-07 DIAGNOSIS — F17210 Nicotine dependence, cigarettes, uncomplicated: Secondary | ICD-10-CM | POA: Insufficient documentation

## 2016-04-07 DIAGNOSIS — Z3202 Encounter for pregnancy test, result negative: Secondary | ICD-10-CM | POA: Insufficient documentation

## 2016-04-07 LAB — URINALYSIS, ROUTINE W REFLEX MICROSCOPIC
Bilirubin Urine: NEGATIVE
GLUCOSE, UA: NEGATIVE mg/dL
Hgb urine dipstick: NEGATIVE
Ketones, ur: NEGATIVE mg/dL
LEUKOCYTES UA: NEGATIVE
Nitrite: NEGATIVE
PROTEIN: NEGATIVE mg/dL
Specific Gravity, Urine: 1.02 (ref 1.005–1.030)
pH: 6 (ref 5.0–8.0)

## 2016-04-07 LAB — WET PREP, GENITAL
Sperm: NONE SEEN
TRICH WET PREP: NONE SEEN
Yeast Wet Prep HPF POC: NONE SEEN

## 2016-04-07 LAB — POCT PREGNANCY, URINE: PREG TEST UR: NEGATIVE

## 2016-04-07 MED ORDER — ACYCLOVIR 400 MG PO TABS: 400.0000 mg | ORAL_TABLET | Freq: Three times a day (TID) | ORAL | Status: DC

## 2016-04-07 MED ORDER — ACYCLOVIR 400 MG PO TABS: 400.0000 mg | ORAL_TABLET | Freq: Three times a day (TID) | ORAL | Status: AC

## 2016-04-07 NOTE — MAU Provider Note (Signed)
History    CSN: [id number]  Arrival date and time: 04/07/16 4098   First Provider Initiated Contact with Patient 04/07/16 [phone]      Chief Complaint  Patient presents with  . Vaginal Discharge   HPI   [name] is a 35 y.o. female G2P0020 here today with vaginal irritation. States it began 4 days ago and describes it as a "raw" sensation on outside of her right labia majora. The burning only occurs when wiping. Reports small amounts of vaginal white/clear thin discharge that is similar to her normal discharge. Possible odor a couple days ago, unsure if any rash or erythema is present. Reports taking a bubble bath the night before symptoms arose and wonders if this could have caused irritation. Used Monistat last night and thinks it has helped a little. Sexually active with boyfriend but reports isolated incident of intercourse with a different partner a couple months ago and is a little concerned about that.  Denies that her boyfriend has any symptoms. Denies recent antibiotic use. Hx of BV which in the past had been occurring ~every 3 months, Flagyl subsequently leads to her getting yeast infections. She has been able to better control BV recently by using unscented products. Diagnosed with Trichomonas in 11/2015. Hx of Chlamydia and Trichomonas ~8 years ago as well. Hx of 2 ectopic pregnancies.   Denies any fever, dysuria, hematuria, nausea, vomiting, abdominal pain.  OB History    Gravida Para Term Preterm AB TAB SAB Ectopic Multiple Living   0      Past Medical History  Diagnosis Date  . GERD (gastroesophageal reflux disease)   . Ectopic pregnancy 2008 and 2014    2008 required surgery, 2nd Misoprostol  . Bipolar 1 disorder (HCC)   . UTI (lower urinary tract infection)   . Trichomonas vaginitis   . Chlamydia   . Anxiety   . Insomnia   . Constipation   . Carpal tunnel syndrome of right wrist 2016    Past Surgical History  Procedure Laterality Date  .  Ectopic pregnancy surgery Right 2008    Family History  Problem Relation Age of Onset  . Cancer Sister 44  . Alcohol abuse Neg Hx   . Arthritis Neg Hx   . Asthma Neg Hx   . Birth defects Neg Hx   . Depression Neg Hx   . Diabetes Neg Hx   . Drug abuse Neg Hx   . Early death Neg Hx   . Hearing loss Neg Hx   . Heart disease Neg Hx   . Hyperlipidemia Neg Hx   . Hypertension Neg Hx   . Kidney disease Neg Hx   . Learning disabilities Neg Hx   . Mental illness Neg Hx   . Mental retardation Neg Hx   . Miscarriages / Stillbirths Neg Hx   . Stroke Neg Hx   . Vision loss Neg Hx   . Varicose Veins Neg Hx   . COPD Sister     Social History  Substance Use Topics  . Smoking status: Current Some Day Smoker -- 0.25 packs/day    Types: Cigarettes    Start date: 05/15/1999    Last Attempt to Quit: 04/12/2014  . Smokeless tobacco: Never Used  . Alcohol Use: 0.0 oz/week    0 Standard drinks or equivalent per week     Comment: occassional    Allergies: No Known Allergies  Prescriptions prior  to admission  Medication Sig Dispense Refill Last Dose  . feeding supplement, ENSURE ENLIVE, (ENSURE ENLIVE) LIQD For nutritional supplement when unable to eat max of three per day 237 mL 12   . hydrOXYzine (ATARAX/VISTARIL) 25 MG tablet Take 1 tablet (25 mg total) by mouth every 6 (six) hours as needed for anxiety (sleep). 60 tablet 0   . lamoTRIgine (LAMICTAL) 25 MG tablet Take 1 tablet (25 mg total) by mouth daily. For mood stabilization 30 tablet 0   . lurasidone (LATUDA) 40 MG TABS tablet Take 1 tablet (40 mg total) by mouth at bedtime. For mood control 30 tablet 0   . mirtazapine (REMERON) 30 MG tablet Take 1 tablet (30 mg total) by mouth at bedtime. For depression 30 tablet 0   . pantoprazole (PROTONIX) 20 MG tablet Take 1 tablet (20 mg total) by mouth daily. For acid reflux 30 tablet 0    Results for orders placed or performed during the hospital encounter of 04/07/16 (from the past 48  hour(s))  Urinalysis, Routine w reflex microscopic (not at [facility])     Status: None   Collection Time: 04/07/16  9:20 AM  Result Value Ref Range   Color, Urine YELLOW YELLOW   APPearance CLEAR CLEAR   Specific Gravity, Urine 1.020 1.005 - 1.030   pH 6.0 5.0 - 8.0   Glucose, UA NEGATIVE NEGATIVE mg/dL   Hgb urine dipstick NEGATIVE NEGATIVE   Bilirubin Urine NEGATIVE NEGATIVE   Ketones, ur NEGATIVE NEGATIVE mg/dL   Protein, ur NEGATIVE NEGATIVE mg/dL   Nitrite NEGATIVE NEGATIVE   Leukocytes, UA NEGATIVE NEGATIVE    Comment: MICROSCOPIC NOT DONE ON URINES WITH NEGATIVE PROTEIN, BLOOD, LEUKOCYTES, NITRITE, OR GLUCOSE <1000 mg/dL.  Pregnancy, urine POC     Status: None   Collection Time: 04/07/16  9:59 AM  Result Value Ref Range   Preg Test, Ur NEGATIVE NEGATIVE    Comment:        THE SENSITIVITY OF THIS METHODOLOGY IS >24 mIU/mL   Wet prep, genital     Status: Abnormal   Collection Time: 04/07/16 10:40 AM  Result Value Ref Range   Yeast Wet Prep HPF POC NONE SEEN NONE SEEN   Trich, Wet Prep NONE SEEN NONE SEEN   Clue Cells Wet Prep HPF POC PRESENT (A) NONE SEEN   WBC, Wet Prep HPF POC MODERATE (A) NONE SEEN    Comment: BACTERIA- TOO NUMEROUS TO COUNT   Sperm NONE SEEN    Review of Systems  Constitutional: Negative for fever and chills.  Respiratory: Negative for shortness of breath.   Cardiovascular: Negative for chest pain.  Gastrointestinal: Negative for nausea, vomiting, abdominal pain, diarrhea and blood in stool.  Genitourinary: Negative for dysuria, urgency, frequency, hematuria and flank pain.  Skin: Negative for itching. Rash: Unsure if vulvar rash is present.   Physical Exam   Blood pressure 112/74, pulse 92, temperature 97.8 F (36.6 C), temperature source Oral, resp. rate 16, last menstrual period 03/30/2016, SpO2 98 %.  Physical Exam  Constitutional: She is oriented to person, place, and time. She appears well-developed and well-nourished.  HENT:  Head:  Normocephalic.  Eyes: EOM are normal.  Neck: Normal range of motion. Neck supple.  Cardiovascular: Normal rate and regular rhythm.   Respiratory: Effort normal and breath sounds normal.  GI: Soft. Bowel sounds are normal. She exhibits no distension. There is no tenderness. There is no rebound and no guarding.  Genitourinary:    There is lesion (3  lesions) on the right labia. There is no lesion on the left labia. Uterus is not enlarged and not tender. Cervix exhibits no motion tenderness and no friability. Right adnexum displays no mass and no tenderness. Left adnexum displays no mass and no tenderness. No erythema, tenderness or bleeding in the vagina. Vaginal discharge (normal physiologic discharge) found.  3-4 pinpoint herpetic like lesions near introitus tender to touch; Herpes culture collected  Neurological: She is alert and oriented to person, place, and time.  Skin: Skin is warm and dry.  Psychiatric: She has a normal mood and affect. Her behavior is normal.    MAU Course  Procedures  None  MDM Preg urine negative Urinalysis: Normal Speculum exam performed Wet prep and cultures for GC/Chlamydia Herpetic lesions seen on right labia near introitus led us to perform HSV culture and typing. Patient agreed with recommendation to have RPR and HIV antibody testing performed.   Assessment and Plan   A:  1. Vaginal lesion   2. Vaginal irritation     P:  Discharge home in stable condition  Lesions suspicious for HSV.  Answered patient's questions regarding transmission of HSV.  Explained that it is difficult to know when it was contracted.  Advised that more testing may be available at a primary care office or the Health Department if she wishes to do so. Rx: Acyclovir 400 mg tablet TID prescribed at today's visit secondary to high suspicion for HSV. Advised to return for any worsening or addition of symptoms Advised condoms    Duane LopeJennifer I Sirinity [place], NP 04/07/2016 9:52 AM

## 2016-04-07 NOTE — MAU Note (Signed)
CC is vaginal itching, burning and tingling since Monday after taking a bubble bath.  Continues to have irritation. No smells. Small amount of vaginal discharge.

## 2016-04-08 LAB — GC/CHLAMYDIA PROBE AMP (~~LOC~~) NOT AT ARMC
Chlamydia: NEGATIVE
Neisseria Gonorrhea: NEGATIVE

## 2016-04-08 LAB — RPR: RPR Ser Ql: NONREACTIVE

## 2016-04-08 LAB — HIV ANTIBODY (ROUTINE TESTING W REFLEX): HIV Screen 4th Generation wRfx: NONREACTIVE

## 2016-04-10 LAB — HSV CULTURE AND TYPING

## 2016-04-12 ENCOUNTER — Telehealth: Payer: Self-pay | Admitting: Student

## 2016-04-12 DIAGNOSIS — B9689 Other specified bacterial agents as the cause of diseases classified elsewhere: Secondary | ICD-10-CM

## 2016-04-12 DIAGNOSIS — N760 Acute vaginitis: Principal | ICD-10-CM

## 2016-04-12 MED ORDER — FLUCONAZOLE 150 MG PO TABS: 150.0000 mg | ORAL_TABLET | Freq: Every day | ORAL | Status: DC

## 2016-04-12 MED ORDER — METRONIDAZOLE 500 MG PO TABS: 500.0000 mg | ORAL_TABLET | Freq: Two times a day (BID) | ORAL | Status: DC

## 2016-04-12 NOTE — Telephone Encounter (Signed)
Pt states that she reviewed her MyChart & saw she may have BV but wasn't treated. States she feels like she has BV; vaginal discharge & odor. Requesting flagyl. Also requesting diflucan b/c she states she always gets a yeast infection after taking flagyl.  Per review of records, pt was seen in MAU on 6/15; + clue cells on wet prep.  Verified allergies & pharmacy.  Diflucan & flagyl sent to pharmacy.   Judeth HornErin Jamon [place], NP

## 2016-06-12 ENCOUNTER — Encounter (HOSPITAL_COMMUNITY): Payer: Self-pay | Admitting: *Deleted

## 2016-06-12 ENCOUNTER — Inpatient Hospital Stay (HOSPITAL_COMMUNITY)
Admission: AD | Admit: 2016-06-12 | Discharge: 2016-06-12 | Disposition: A | Discharge status: Home / Self Care | Payer: Self-pay | Source: Ambulatory Visit | Attending: Obstetrics and Gynecology | Admitting: Obstetrics and Gynecology

## 2016-06-12 DIAGNOSIS — O99332 Smoking (tobacco) complicating pregnancy, second trimester: Secondary | ICD-10-CM | POA: Insufficient documentation

## 2016-06-12 DIAGNOSIS — O0912 Supervision of pregnancy with history of ectopic or molar pregnancy, second trimester: Secondary | ICD-10-CM | POA: Insufficient documentation

## 2016-06-12 DIAGNOSIS — F419 Anxiety disorder, unspecified: Secondary | ICD-10-CM | POA: Insufficient documentation

## 2016-06-12 DIAGNOSIS — F319 Bipolar disorder, unspecified: Secondary | ICD-10-CM | POA: Insufficient documentation

## 2016-06-12 DIAGNOSIS — O09522 Supervision of elderly multigravida, second trimester: Secondary | ICD-10-CM | POA: Insufficient documentation

## 2016-06-12 DIAGNOSIS — K219 Gastro-esophageal reflux disease without esophagitis: Secondary | ICD-10-CM | POA: Insufficient documentation

## 2016-06-12 DIAGNOSIS — O99342 Other mental disorders complicating pregnancy, second trimester: Secondary | ICD-10-CM | POA: Insufficient documentation

## 2016-06-12 DIAGNOSIS — O26892 Other specified pregnancy related conditions, second trimester: Secondary | ICD-10-CM | POA: Insufficient documentation

## 2016-06-12 DIAGNOSIS — R1031 Right lower quadrant pain: Secondary | ICD-10-CM | POA: Insufficient documentation

## 2016-06-12 DIAGNOSIS — O9989 Other specified diseases and conditions complicating pregnancy, childbirth and the puerperium: Secondary | ICD-10-CM | POA: Insufficient documentation

## 2016-06-12 DIAGNOSIS — N760 Acute vaginitis: Secondary | ICD-10-CM | POA: Insufficient documentation

## 2016-06-12 DIAGNOSIS — B9689 Other specified bacterial agents as the cause of diseases classified elsewhere: Secondary | ICD-10-CM

## 2016-06-12 LAB — POCT PREGNANCY, URINE: Preg Test, Ur: NEGATIVE

## 2016-06-12 LAB — URINE MICROSCOPIC-ADD ON

## 2016-06-12 LAB — URINALYSIS, ROUTINE W REFLEX MICROSCOPIC
Bilirubin Urine: NEGATIVE
Glucose, UA: NEGATIVE mg/dL
KETONES UR: NEGATIVE mg/dL
LEUKOCYTES UA: NEGATIVE
NITRITE: NEGATIVE
PH: 6 (ref 5.0–8.0)
PROTEIN: NEGATIVE mg/dL
Specific Gravity, Urine: 1.025 (ref 1.005–1.030)

## 2016-06-12 LAB — WET PREP, GENITAL
SPERM: NONE SEEN
Trich, Wet Prep: NONE SEEN
YEAST WET PREP: NONE SEEN

## 2016-06-12 MED ORDER — METRONIDAZOLE 500 MG PO TABS: 500.0000 mg | ORAL_TABLET | Freq: Two times a day (BID) | ORAL | 0 refills | Status: AC

## 2016-06-12 NOTE — MAU Note (Signed)
Pt states that her period started Sunday.  Pt states she has a history of ectopic pregnancies and states this pain is similar and is more on the right.  Pt states she is having irritation in her vagina.

## 2016-06-12 NOTE — MAU Provider Note (Signed)
Chief Complaint: Vaginal Bleeding and Abdominal Pain     SUBJECTIVE HPI: Natalie Fischer is a 35 y.o. G2P0020 at Unknown who presents to Maternity Admissions reporting right sided pelvic/RLQ pain. Pain started at the beginning of her menses, 06/06/16. Currently spotting. Pain started as crampy, period-like pain but started getting worse yesterday. Today pain became sharp, and was concerned may be an ectopic again (had h/o 2 ectopics). Sharp pain comes/goes and so does cramping pain. Having BMs that are hard, daily, most recent last night. Patient wanted to make sure she did not have an ectopic like she has had previously (since she felt it was the same kind of pain).   Patient had recently been diagnosed with BV and treated with flagyl, but lost the prescription after 3 days (in June). Also c/o vaginal irritation again like previous vaginosis. Including some mild increased discharge prior to her menses last week.     Past Medical History:  Diagnosis Date  . Anxiety   . Bipolar 1 disorder (HCC)   . Carpal tunnel syndrome of right wrist 2016  . Chlamydia   . Constipation   . Ectopic pregnancy 2008 and 2014   2008 required surgery, 2nd Misoprostol  . GERD (gastroesophageal reflux disease)   . Insomnia   . Trichomonas vaginitis   . UTI (lower urinary tract infection)    OB History  Gravida Para Term Preterm AB Living  2       2 0  SAB TAB Ectopic Multiple Live Births      2        # Outcome Date GA Lbr Len/2nd Weight Sex Delivery Anes PTL Lv  2 Ectopic 2014             Birth Comments: System Generated. Please review and update pregnancy details.  1 Ectopic 2008             Past Surgical History:  Procedure Laterality Date  . ECTOPIC PREGNANCY SURGERY Right 2008   Social History   Social History  . Marital status: Single    Spouse name: N/A  . Number of children: 0  . Years of education: college-1   Occupational History  . unemployed     Previously worked [employer],  Clinical biochemistcustomer service.   Social History Main Topics  . Smoking status: Current Some Day Smoker    Packs/day: 0.25    Types: Cigarettes    Start date: 05/15/1999    Last attempt to quit: 04/12/2014  . Smokeless tobacco: Never Used  . Alcohol use 0.0 oz/week     Comment: occassional  . Drug use:     Types: Cocaine     Comment: Xanax  . Sexual activity: Yes   Other Topics Concern  . Not on file   Social History Narrative   [place]   [place] mother got a job in one of the [place].   Now living with her father.  Sometimes with her sister.   Parents separated.      No current facility-administered medications on file prior to encounter.    Current Outpatient Prescriptions on File Prior to Encounter  Medication Sig Dispense Refill  . feeding supplement, ENSURE ENLIVE, (ENSURE ENLIVE) LIQD For nutritional supplement when unable to eat max of three per day 237 mL 12  . hydrOXYzine (ATARAX/VISTARIL) 25 MG tablet Take 1 tablet (25 mg total) by mouth every 6 (six) hours  as needed for anxiety (sleep). 60 tablet 0  . lamoTRIgine (LAMICTAL) 25 MG tablet Take 1 tablet (25 mg total) by mouth daily. For mood stabilization 30 tablet 0  . lurasidone (LATUDA) 40 MG TABS tablet Take 1 tablet (40 mg total) by mouth at bedtime. For mood control 30 tablet 0  . mirtazapine (REMERON) 30 MG tablet Take 1 tablet (30 mg total) by mouth at bedtime. For depression 30 tablet 0  . pantoprazole (PROTONIX) 20 MG tablet Take 1 tablet (20 mg total) by mouth daily. For acid reflux 30 tablet 0   No Known Allergies  I have reviewed the past Medical Hx, Surgical Hx, Social Hx, Allergies and Medications.   REVIEW OF SYSTEMS  OPHTHALMIC: negative for - blurry vision, decreased vision, double vision, photophobia or scotomata RESPIRATORY: no cough, shortness of breath, or wheezing CARDIOVASCULAR: no chest pain or dyspnea on exertion GASTROINTESTINAL: RLQ  abdominal/pelvic pain, change in bowel habits, or black or bloody stools negative for - epigastric or RUQ pain GENITO-URINARY: no dysuria, trouble voiding, or hematuria negative for - genital discharge, vulvar/vaginal symptoms. + vaginal bleeding, ending menses.  MUSKULOSKELETAL: negative for - gait disturbance or swelling in ankle - bilateral, foot - bilateral and leg - bilateral NEUROLOGICAL: negative for - dizziness, gait disturbance, headaches, numbness/tingling or visual changes DERMATOLOGICAL: negative OBSTETRICAL: No vaginal bleeding, no leaking fluid, no contractions. Good fetal movement.   OBJECTIVE Patient Vitals for the past 24 hrs:  BP Temp Temp src Pulse Resp  06/12/16 1126 123/92 97.5 F (36.4 C) Oral 79 18    PHYSICAL EXAM Constitutional: Well-developed, well-nourished female in no acute distress.  Cardiovascular: normal rate Respiratory: normal rate and effort.  GI: Abd soft, RLQ/pelvic tenderness on palpation without guarding or rebound tenderness. Pos BS x 4 but hyperactive. MS: Extremities nontender, no edema, normal ROM Neurologic: Alert and oriented x 4.  GU: Neg CVAT.  BIMANUAL: cervix closed; uterus normal size, no adnexal tenderness or masses but right pelvic pain on palpation.  No CMT.  LAB RESULTS Results for orders placed or performed during the hospital encounter of 06/12/16 (from the past 24 hour(s))  Urinalysis, Routine w reflex microscopic (not at [facility])     Status: Abnormal   Collection Time: 06/12/16 11:15 AM  Result Value Ref Range   Color, Urine YELLOW YELLOW   APPearance CLEAR CLEAR   Specific Gravity, Urine 1.025 1.005 - 1.030   pH 6.0 5.0 - 8.0   Glucose, UA NEGATIVE NEGATIVE mg/dL   Hgb urine dipstick LARGE (A) NEGATIVE   Bilirubin Urine NEGATIVE NEGATIVE   Ketones, ur NEGATIVE NEGATIVE mg/dL   Protein, ur NEGATIVE NEGATIVE mg/dL   Nitrite NEGATIVE NEGATIVE   Leukocytes, UA NEGATIVE NEGATIVE  Urine microscopic-add on     Status:  Abnormal   Collection Time: 06/12/16 11:15 AM  Result Value Ref Range   Squamous Epithelial / LPF 0-5 (A) NONE SEEN   WBC, UA 0-5 0 - 5 WBC/hpf   RBC / HPF 6-30 0 - 5 RBC/hpf   Bacteria, UA FEW (A) NONE SEEN  Pregnancy, urine POC     Status: None   Collection Time: 06/12/16 11:20 AM  Result Value Ref Range   Preg Test, Ur NEGATIVE NEGATIVE  Wet prep, genital     Status: Abnormal   Collection Time: 06/12/16 12:13 PM  Result Value Ref Range   Yeast Wet Prep HPF POC NONE SEEN NONE SEEN   Trich, Wet Prep NONE SEEN NONE SEEN   Clue  Cells Wet Prep HPF POC PRESENT (A) NONE SEEN   WBC, Wet Prep HPF POC FEW (A) NONE SEEN   Sperm NONE SEEN     IMAGING No results found.  MAU COURSE Wet prep collected, pelvic exam performed, urine bhcg negative.   MDM Unable to review plan of care with patient, including labs and tests ordered and medical treatment, due to patient left AMA since her ride was here despite being told her discharge paperwork was being gathered.  ASSESSMENT 1. Bacterial vaginosis   2. Right lower quadrant abdominal pain     PLAN Patient left AMA, but was stable upon discharge. RN was going to call patient regarding test results and medication for BV.      Medication List    STOP taking these medications   fluconazole 150 MG tablet Commonly known as:  DIFLUCAN     TAKE these medications   feeding supplement (ENSURE ENLIVE) Liqd For nutritional supplement when unable to eat max of three per day   hydrOXYzine 25 MG tablet Commonly known as:  ATARAX/VISTARIL Take 1 tablet (25 mg total) by mouth every 6 (six) hours as needed for anxiety (sleep).   ibuprofen 200 MG tablet Commonly known as:  ADVIL,MOTRIN Take 800 mg by mouth every 6 (six) hours as needed for moderate pain.   lamoTRIgine 25 MG tablet Commonly known as:  LAMICTAL Take 1 tablet (25 mg total) by mouth daily. For mood stabilization   lurasidone 40 MG Tabs tablet Commonly known as:  LATUDA Take  1 tablet (40 mg total) by mouth at bedtime. For mood control   metroNIDAZOLE 500 MG tablet Commonly known as:  FLAGYL Take 1 tablet (500 mg total) by mouth 2 (two) times daily. Do NOT drink alcohol with medication or for 3 days after. What changed:  additional instructions   mirtazapine 30 MG tablet Commonly known as:  REMERON Take 1 tablet (30 mg total) by mouth at bedtime. For depression   pantoprazole 20 MG tablet Commonly known as:  PROTONIX Take 1 tablet (20 mg total) by mouth daily. For acid reflux        Hiram Comber [place], [place] 06/12/2016  1:56 PM

## 2016-06-12 NOTE — Discharge Instructions (Signed)
Bacterial Vaginosis °Bacterial vaginosis is an infection of the vagina. It happens when too many germs (bacteria) grow in the vagina. Having this infection puts you at risk for getting other infections from sex. Treating this infection can help lower your risk for other infections, such as:  °· Chlamydia. °· Gonorrhea. °· HIV. °· Herpes. °HOME CARE °· Take your medicine as told by your doctor. °· Finish your medicine even if you start to feel better. °· Tell your sex partner that you have an infection. They should see their doctor for treatment. °· During treatment: °· Avoid sex or use condoms correctly. °· Do not douche. °· Do not drink alcohol unless your doctor tells you it is ok. °· Do not breastfeed unless your doctor tells you it is ok. °GET HELP IF: °· You are not getting better after 3 days of treatment. °· You have more grey fluid (discharge) coming from your vagina than before. °· You have more pain than before. °· You have a fever. °MAKE SURE YOU:  °· Understand these instructions. °· Will watch your condition. °· Will get help right away if you are not doing well or get worse. °  °This information is not intended to replace advice given to you by your health care provider. Make sure you discuss any questions you have with your health care provider. °  °Document Released: 07/19/2008 Document Revised: 10/31/2014 Document Reviewed: 05/22/2013 °Elsevier Interactive Patient Education ©2016 [employer]vier Inc[employer] ° °Abdominal Pain, Adult °Many things can cause belly (abdominal) pain. Most times, the belly pain is not dangerous. Many cases of belly pain can be watched and treated at home. °HOME CARE  °· Do not take medicines that help you go poop (laxatives) unless told to by your doctor. °· Only take medicine as told by your doctor. °· Eat or drink as told by your doctor. Your doctor will tell you if you should be on a special diet. °GET HELP IF: °· You do not know what is causing your belly pain. °· You have belly  pain while you are sick to your stomach (nauseous) or have runny poop (diarrhea). °· You have pain while you pee or poop. °· Your belly pain wakes you up at night. °· You have belly pain that gets worse or better when you eat. °· You have belly pain that gets worse when you eat fatty foods. °· You have a fever. °GET HELP RIGHT AWAY IF:  °· The pain does not go away within 2 hours. °· You keep throwing up (vomiting). °· The pain changes and is only in the right or left part of the belly. °· You have bloody or tarry looking poop. °MAKE SURE YOU:  °· Understand these instructions. °· Will watch your condition. °· Will get help right away if you are not doing well or get worse. °  °This information is not intended to replace advice given to you by your health care provider. Make sure you discuss any questions you have with your health care provider. °  °Document Released: 03/28/2008 Document Revised: 10/31/2014 Document Reviewed: 06/19/2013 °Elsevier Interactive Patient Education ©2016 Elsevier Inc. ° °

## 2016-06-12 NOTE — MAU Note (Signed)
Tried to call patient with wet prep results, phone number listed is incorrect.

## 2016-06-12 NOTE — MAU Note (Signed)
Patient stated has to leave because her ride is here and requests to be called with results and/or send scripts to her pharmacy. Notified Dr. Omer JackMumaw

## 2016-08-08 ENCOUNTER — Ambulatory Visit (INDEPENDENT_AMBULATORY_CARE_PROVIDER_SITE_OTHER): Payer: Self-pay | Admitting: Internal Medicine

## 2016-08-08 ENCOUNTER — Encounter: Payer: Self-pay | Admitting: Internal Medicine

## 2016-08-08 VITALS — BP 108/70 | HR 78 | Ht 69.0 in | Wt 123.0 lb

## 2016-08-08 DIAGNOSIS — R102 Pelvic and perineal pain: Secondary | ICD-10-CM

## 2016-08-08 LAB — POCT URINALYSIS DIPSTICK
Bilirubin, UA: NEGATIVE
Glucose, UA: NEGATIVE
KETONES UA: NEGATIVE
Nitrite, UA: NEGATIVE
SPEC GRAV UA: 1.025
Urobilinogen, UA: 0.2
pH, UA: 5

## 2016-08-08 LAB — POCT URINE PREGNANCY: Preg Test, Ur: NEGATIVE

## 2016-08-08 NOTE — Patient Instructions (Signed)
Call if your pain worsens or you develop new symptoms along with the pain--such as vaginal discharge.

## 2016-08-08 NOTE — Progress Notes (Signed)
   Subjective:    Patient ID: Avon GullyLizzette L Loeb, female    DOB: 1981/05/15, 35 y.o.   MRN: [id number]  HPI  1.  Pelvic pain with intermittent spotting for past week.  Taking Ibuprofen 800 mg once daily and helps, but then back again the next morning. Had intercourse about 8 days ago and had sudden cramping pain with penetration.  No vaginal discharge, thought states was [facility]. This is a different partner than the one in February when she had Trichomonas.   She does not believe her partner is having symptoms.  They use a condom every time.  2.  Dental decay:  Has already been to dental clinic and they are working on pulling several teeth and then fitted for a partial.  3.  GERD:  Symptoms resolved.    Review of Systems     Objective:   Physical Exam NAD Significant dental decay Lungs:  CTA CV:  RRR without murmur or rub Abd:  S, tender in suprapubic area, + BS No HSM or mass. GU:  Some blood from cervical os and extending to external genitalia.  No discharge or odor.  No underlying inflammation of cervix or vaginal mucosa.  No CMT, No uterine or right adnexal mass or tenderness.  Mild left adnexal tenderness.       Assessment & Plan:  Pelvic pain:  UA with blood and protein only likely from uterine bleeding .  Urine HCG neg.  Obtained swab for GC/chlamydia, but suspect will be negative.  No discharge to test for microscopic. No concerning findings--possibly just an extended period.  To call if worsens or does not resolve/develops new symptoms  GERD/Dental:  Resolved or in process of treatment.

## 2016-08-10 LAB — GC/CHLAMYDIA PROBE AMP
CHLAMYDIA, DNA PROBE: NEGATIVE
NEISSERIA GONORRHOEAE BY PCR: NEGATIVE

## 2017-02-20 ENCOUNTER — Ambulatory Visit: Payer: Self-pay | Admitting: Internal Medicine

## 2017-04-19 ENCOUNTER — Encounter (HOSPITAL_COMMUNITY): Payer: Self-pay | Admitting: Emergency Medicine

## 2017-04-19 ENCOUNTER — Emergency Department (HOSPITAL_COMMUNITY)
Admission: EM | Admit: 2017-04-19 | Discharge: 2017-04-19 | Disposition: A | Discharge status: Home / Self Care | Payer: Self-pay | Attending: Emergency Medicine | Admitting: Emergency Medicine

## 2017-04-19 DIAGNOSIS — F17210 Nicotine dependence, cigarettes, uncomplicated: Secondary | ICD-10-CM | POA: Insufficient documentation

## 2017-04-19 DIAGNOSIS — Z79899 Other long term (current) drug therapy: Secondary | ICD-10-CM | POA: Insufficient documentation

## 2017-04-19 DIAGNOSIS — K047 Periapical abscess without sinus: Secondary | ICD-10-CM

## 2017-04-19 DIAGNOSIS — K0889 Other specified disorders of teeth and supporting structures: Secondary | ICD-10-CM | POA: Insufficient documentation

## 2017-04-19 MED ORDER — AMOXICILLIN 500 MG PO CAPS: 500.0000 mg | ORAL_CAPSULE | Freq: Three times a day (TID) | ORAL | 0 refills | Status: DC

## 2017-04-19 MED ORDER — NAPROXEN 375 MG PO TABS: 375.0000 mg | ORAL_TABLET | Freq: Two times a day (BID) | ORAL | 0 refills | Status: DC

## 2017-04-19 MED ORDER — HYDROCODONE-ACETAMINOPHEN 5-325 MG PO TABS
1.0000 | ORAL_TABLET | Freq: Once | ORAL | Status: AC
  Administered 2017-04-19: 1 via ORAL
  Filled 2017-04-19: qty 1

## 2017-04-19 MED ORDER — AMOXICILLIN 500 MG PO CAPS
500.0000 mg | ORAL_CAPSULE | Freq: Once | ORAL | Status: AC
  Administered 2017-04-19: 500 mg via ORAL
  Filled 2017-04-19: qty 1

## 2017-04-19 NOTE — ED Triage Notes (Signed)
C/o R ear pain since last Thursday and R sided upper and lower dental pain since Monday.

## 2017-04-19 NOTE — Discharge Instructions (Signed)
Take the medication as directed. Follow up with the oral surgeon as planned.

## 2017-04-19 NOTE — ED Provider Notes (Signed)
[facility]EM[facility]Y DEPT Provider Note   CSN: [id number] Arrival date & time: 04/19/17  1854  By signing my name below, I, Phillips ClimesFabiola de Louis, attest that this documentation has been prepared under the direction and in the presence of Kerrie BuffaloHope Everline Mahaffy, NP. Electronically Signed: Phillips ClimesFabiola de Louis, Scribe. 04/19/2017. 9:07 PM.  History   Chief Complaint Chief Complaint  Patient presents with  . Otalgia  . Dental Pain   HPI Comments Natalie Fischer is a 36 y.o. female with a PMHx significant for bipolar disorder and substance abuse, who presents to the Emergency Department with complaints of gradually worsening right tooth and jaw pain, with radiation to her right ear x1wk.  Pain is worse when pt attempts to chew food, currently rated a 10/10 in severity.  No trouble swallowing.  Associated nausea.  No abdominal pain or vomiting.  No fevers or chills. Pt denies experiencing any other acute sx.  Pt with known dental cavities and teeth that are scheduled to be pulled.  She is currently awaiting an appt with an oral surgeon.   The history is provided by the patient and medical records. No language interpreter was used.    Past Medical History:  Diagnosis Date  . Anxiety   . Bipolar 1 disorder (HCC)   . Carpal tunnel syndrome of right wrist 2016  . Chlamydia   . Constipation   . Ectopic pregnancy 2008 and 2014   2008 required surgery, 2nd Misoprostol  . GERD (gastroesophageal reflux disease)   . Insomnia   . Trichomonas vaginitis   . UTI (lower urinary tract infection)     Patient Active Problem List   Diagnosis Date Noted  . Mild benzodiazepine use disorder 01/23/2016  . Cocaine abuse 01/22/2016  . Bipolar 1 disorder, depressed, moderate (HCC) 02/20/2015  . Cocaine use disorder, severe, dependence (HCC)   . Cannabis use disorder, moderate, dependence (HCC)   . ANEMIA 05/11/2010  . LOSS OF WEIGHT 03/07/2008  . ALLERGIC RHINITIS 01/18/2008  . INSOMNIA 01/18/2008  . ANXIETY 08/31/2007  .  GERD 06/18/2007  . CONSTIPATION 06/18/2007    Past Surgical History:  Procedure Laterality Date  . ECTOPIC PREGNANCY SURGERY Right 2008    OB History    Gravida Para Term Preterm AB Living   2       2 0   SAB TAB Ectopic Multiple Live Births       2           Home Medications    Prior to Admission medications   Medication Sig Start Date End Date Taking? Authorizing Provider  amoxicillin (AMOXIL) 500 MG capsule Take 1 capsule (500 mg total) by mouth 3 (three) times daily. 04/19/17   Janne NapoleonNeese, Samin Milke M, NP  feeding supplement, ENSURE ENLIVE, (ENSURE ENLIVE) LIQD For nutritional supplement when unable to eat max of three per day 01/27/16   Rachael FeeLugo, Irving A, MD  hydrOXYzine (ATARAX/VISTARIL) 25 MG tablet Take 1 tablet (25 mg total) by mouth every 6 (six) hours as needed for anxiety (sleep). 01/27/16   Armandina StammerNwoko, Agnes I, NP  ibuprofen (ADVIL,MOTRIN) 200 MG tablet Take 800 mg by mouth every 6 (six) hours as needed for moderate pain.     [provider]  lamoTRIgine (LAMICTAL) 25 MG tablet Take 1 tablet (25 mg total) by mouth daily. For mood stabilization 01/27/16   Armandina StammerNwoko, Agnes I, NP  lurasidone (LATUDA) 40 MG TABS tablet Take 1 tablet (40 mg total) by mouth at bedtime. For mood control 01/27/16  Armandina Stammer I, NP  mirtazapine (REMERON) 30 MG tablet Take 1 tablet (30 mg total) by mouth at bedtime. For depression 01/27/16   Armandina Stammer I, NP  naproxen (NAPROSYN) 375 MG tablet Take 1 tablet (375 mg total) by mouth 2 (two) times daily. 04/19/17   Janne Napoleon, NP  pantoprazole (PROTONIX) 20 MG tablet Take 1 tablet (20 mg total) by mouth daily. For acid reflux 01/27/16   Sanjuana Kava, NP    Family History Family History  Problem Relation Age of Onset  . Cancer Sister 42  . COPD Sister   . Alcohol abuse Neg Hx   . Arthritis Neg Hx   . Asthma Neg Hx   . Birth defects Neg Hx   . Depression Neg Hx   . Diabetes Neg Hx   . Drug abuse Neg Hx   . Early death Neg Hx   . Hearing loss Neg Hx   .  Heart disease Neg Hx   . Hyperlipidemia Neg Hx   . Hypertension Neg Hx   . Kidney disease Neg Hx   . Learning disabilities Neg Hx   . Mental illness Neg Hx   . Mental retardation Neg Hx   . Miscarriages / Stillbirths Neg Hx   . Stroke Neg Hx   . Vision loss Neg Hx   . Varicose Veins Neg Hx     Social History Social History  Substance Use Topics  . Smoking status: Current Some Day Smoker    Packs/day: 0.25    Types: Cigarettes    Start date: 05/15/1999    Last attempt to quit: 04/12/2014  . Smokeless tobacco: Never Used  . Alcohol use 0.0 oz/week     Comment: occassional     Allergies   Patient has no known allergies.   Review of Systems Review of Systems  Constitutional: Negative for chills and fever.  HENT: Positive for dental problem and ear pain. Negative for ear discharge, facial swelling and trouble swallowing.   Gastrointestinal: Positive for nausea. Negative for abdominal pain and vomiting.   Physical Exam Updated Vital Signs BP 122/85 (BP Location: Right Arm)   Pulse 87   Temp 98.5 F (36.9 C) (Oral)   Resp 18   Ht 5\' 11"  (1.803 m)   Wt 61.5 kg (135 lb 9.6 oz)   LMP 03/22/2017   SpO2 100%   BMI 18.91 kg/m   Physical Exam  Constitutional: She is oriented to person, place, and time. She appears well-developed and well-nourished.  HENT:  Right Ear: Tympanic membrane and external ear normal.  Left Ear: External ear normal.  2nd and 3rd molars decayed to the gum line.  Swelling surrounding the teeth.  Eyes: EOM are normal.  Neck: Normal range of motion. Neck supple.  Cervical lymphadenopathy.  Pulmonary/Chest: Effort normal.  Abdominal: Soft. There is no tenderness.  Musculoskeletal: Normal range of motion.  Neurological: She is alert and oriented to person, place, and time. No cranial nerve deficit.  Skin: Skin is warm and dry.  Nursing note and vitals reviewed.  ED Treatments / Results  DIAGNOSTIC STUDIES: Oxygen Saturation is 100% on room air,  normal by my interpretation.    COORDINATION OF CARE: 9:04 PM Discussed treatment plan with pt at bedside and pt agreed to plan.  Labs (all labs ordered are listed, but only abnormal results are displayed) Labs Reviewed - No data to display  Radiology No results found.  Procedures Procedures (including critical care time)  Medications  Ordered in ED Medications  amoxicillin (AMOXIL) capsule 500 mg (500 mg Oral Given 04/19/17 2108)  HYDROcodone-acetaminophen (NORCO/VICODIN) 5-325 MG per tablet 1 tablet (1 tablet Oral Given 04/19/17 2109)     Initial Impression / Assessment and Plan / ED Course  I have reviewed the triage vital signs and the nursing notes. Patient with toothache.  No gross abscess.  Exam unconcerning for Ludwig's angina or spread of infection.  Will treat with antibiotics and pain medicine.  Urged patient to follow-up with dentist.     Final Clinical Impressions(s) / ED Diagnoses   Final diagnoses:  Dental infection    New Prescriptions Discharge Medication List as of 04/19/2017  9:03 PM    START taking these medications   Details  amoxicillin (AMOXIL) 500 MG capsule Take 1 capsule (500 mg total) by mouth 3 (three) times daily., Starting Wed 04/19/2017, Print    naproxen (NAPROSYN) 375 MG tablet Take 1 tablet (375 mg total) by mouth 2 (two) times daily., Starting Wed 04/19/2017, Print         Alden, [place], [place] 05/05/17 1717    Loren Racer, MD 05/08/17 1538

## 2017-06-26 ENCOUNTER — Encounter (HOSPITAL_COMMUNITY): Payer: Self-pay | Admitting: *Deleted

## 2017-06-26 ENCOUNTER — Inpatient Hospital Stay (HOSPITAL_COMMUNITY)
Admission: AD | Admit: 2017-06-26 | Discharge: 2017-06-26 | Discharge status: Against Medical Advice | Payer: Self-pay | Source: Ambulatory Visit | Attending: Obstetrics & Gynecology | Admitting: Obstetrics & Gynecology

## 2017-06-26 DIAGNOSIS — R531 Weakness: Secondary | ICD-10-CM | POA: Insufficient documentation

## 2017-06-26 DIAGNOSIS — F17210 Nicotine dependence, cigarettes, uncomplicated: Secondary | ICD-10-CM | POA: Insufficient documentation

## 2017-06-26 DIAGNOSIS — Z79899 Other long term (current) drug therapy: Secondary | ICD-10-CM | POA: Insufficient documentation

## 2017-06-26 LAB — CBC
HEMATOCRIT: 40.1 % (ref 36.0–46.0)
HEMOGLOBIN: 13.2 g/dL (ref 12.0–15.0)
MCH: 29.1 pg (ref 26.0–34.0)
MCHC: 32.9 g/dL (ref 30.0–36.0)
MCV: 88.5 fL (ref 78.0–100.0)
PLATELETS: 238 10*3/uL (ref 150–400)
RBC: 4.53 MIL/uL (ref 3.87–5.11)
RDW: 12.9 % (ref 11.5–15.5)
WBC: 8.3 10*3/uL (ref 4.0–10.5)

## 2017-06-26 LAB — BASIC METABOLIC PANEL
ANION GAP: 5 (ref 5–15)
BUN: 10 mg/dL (ref 6–20)
CHLORIDE: 98 mmol/L — AB (ref 101–111)
CO2: 28 mmol/L (ref 22–32)
CREATININE: 1.12 mg/dL — AB (ref 0.44–1.00)
Calcium: 8.8 mg/dL — ABNORMAL LOW (ref 8.9–10.3)
GFR calc non Af Amer: >60 mL/min (ref >60)
Glucose, Bld: 189 mg/dL — ABNORMAL HIGH (ref 65–99)
POTASSIUM: 3.9 mmol/L (ref 3.5–5.1)
Sodium: 131 mmol/L — ABNORMAL LOW (ref 135–145)

## 2017-06-26 LAB — URINALYSIS, ROUTINE W REFLEX MICROSCOPIC
Bilirubin Urine: NEGATIVE
GLUCOSE, UA: NEGATIVE mg/dL
Hgb urine dipstick: NEGATIVE
Ketones, ur: NEGATIVE mg/dL
LEUKOCYTES UA: NEGATIVE
NITRITE: NEGATIVE
PH: 6 (ref 5.0–8.0)
Protein, ur: NEGATIVE mg/dL
SPECIFIC GRAVITY, URINE: 1.017 (ref 1.005–1.030)

## 2017-06-26 LAB — I-STAT BETA HCG BLOOD, ED (MC, WL, AP ONLY)

## 2017-06-26 LAB — POCT PREGNANCY, URINE: Preg Test, Ur: NEGATIVE

## 2017-06-26 MED ORDER — ONDANSETRON HCL 4 MG/2ML IJ SOLN
INTRAMUSCULAR | Status: AC
  Filled 2017-06-26: qty 2

## 2017-06-26 NOTE — MAU Note (Signed)
Pt C/O vaginal odor, also clear/yellow/brown liquid discharge.  Thinks she may have toilet tissue in vagina.  Wants to be checked for infection/STD's.  Denies pain.

## 2017-06-26 NOTE — ED Triage Notes (Signed)
Pt c/o weakness that began tonight. Pt states she drank a wine cooler and felt lightheaded afterwards, when she laid down, the lightheadedness went away. Also states her blood pressure has been low. BP in triage-84/61

## 2017-06-26 NOTE — MAU Note (Signed)
Pt states she has to leave, cannot wait any longer, may try to come back tomorrow.  AMA form signed.

## 2017-06-27 ENCOUNTER — Emergency Department (HOSPITAL_COMMUNITY): Payer: Self-pay

## 2017-06-27 ENCOUNTER — Emergency Department (HOSPITAL_COMMUNITY)
Admission: EM | Admit: 2017-06-27 | Discharge: 2017-06-27 | Disposition: A | Discharge status: Home / Self Care | Payer: Self-pay | Attending: Emergency Medicine | Admitting: Emergency Medicine

## 2017-06-27 DIAGNOSIS — R531 Weakness: Secondary | ICD-10-CM

## 2017-06-27 LAB — URINALYSIS, ROUTINE W REFLEX MICROSCOPIC
Bilirubin Urine: NEGATIVE
GLUCOSE, UA: NEGATIVE mg/dL
HGB URINE DIPSTICK: NEGATIVE
KETONES UR: NEGATIVE mg/dL
Leukocytes, UA: NEGATIVE
Nitrite: NEGATIVE
PROTEIN: 30 mg/dL — AB
Specific Gravity, Urine: 1.018 (ref 1.005–1.030)
pH: 5 (ref 5.0–8.0)

## 2017-06-27 MED ORDER — ONDANSETRON HCL 4 MG/2ML IJ SOLN: 4.0000 mg | Freq: Once | INTRAMUSCULAR | Status: DC

## 2017-06-27 MED ORDER — ONDANSETRON HCL 4 MG/2ML IJ SOLN
4.0000 mg | Freq: Once | INTRAMUSCULAR | Status: AC | PRN
  Administered 2017-06-26: 4 mg via INTRAVENOUS

## 2017-06-27 MED ORDER — SODIUM CHLORIDE 0.9 % IV BOLUS (SEPSIS)
1000.0000 mL | Freq: Once | INTRAVENOUS | Status: AC
  Administered 2017-06-27: 1000 mL via INTRAVENOUS

## 2017-06-27 NOTE — ED Provider Notes (Signed)
[facility] Provider Note   CSN: [id number] Arrival date & time: 06/26/17  2254     History   Chief Complaint Chief Complaint  Patient presents with  . Weakness    HPI Natalie Fischer is a 36 y.o. female.  HPI Patient presents with concern of episodic lightheadedness. Patient notes that over the past few years she has had multiple prior similar episodes, and tonight had one episode of near-syncope. She acknowledges multiple medical issues including bipolar disorder, states that she takes her medication inconsistently. Patient also notes that she has been drinking alcohol, and used cocaine 2 days ago. Currently patient denies chest pain, dyspnea, states that she feels generally weak, lightheaded. She is here with her mother who assists with the history of present illness. In the waiting room the patient one episode of vomiting.  Past Medical History:  Diagnosis Date  . Anxiety   . Bipolar 1 disorder (HCC)   . Carpal tunnel syndrome of right wrist 2016  . Chlamydia   . Constipation   . Ectopic pregnancy 2008 and 2014   2008 required surgery, 2nd Misoprostol  . GERD (gastroesophageal reflux disease)   . Insomnia   . Trichomonas vaginitis   . UTI (lower urinary tract infection)     Patient Active Problem List   Diagnosis Date Noted  . Mild benzodiazepine use disorder 01/23/2016  . Cocaine abuse 01/22/2016  . Bipolar 1 disorder, depressed, moderate (HCC) 02/20/2015  . Cocaine use disorder, severe, dependence (HCC)   . Cannabis use disorder, moderate, dependence (HCC)   . ANEMIA 05/11/2010  . LOSS OF WEIGHT 03/07/2008  . ALLERGIC RHINITIS 01/18/2008  . INSOMNIA 01/18/2008  . ANXIETY 08/31/2007  . GERD 06/18/2007  . CONSTIPATION 06/18/2007    Past Surgical History:  Procedure Laterality Date  . ECTOPIC PREGNANCY SURGERY Right 2008    OB History    Gravida Para Term Preterm AB Living   2       2 0   SAB TAB Ectopic Multiple Live Births       2           Home Medications    Prior to Admission medications   Medication Sig Start Date End Date Taking? Authorizing Provider  ARIPiprazole (ABILIFY) 15 MG tablet Take 15 mg by mouth at bedtime.   Yes [provider]  feeding supplement, ENSURE ENLIVE, (ENSURE ENLIVE) LIQD For nutritional supplement when unable to eat max of three per day 01/27/16  Yes Rachael FeeLugo, Irving A, MD  hydrOXYzine (ATARAX/VISTARIL) 25 MG tablet Take 1 tablet (25 mg total) by mouth every 6 (six) hours as needed for anxiety (sleep). 01/27/16  Yes Armandina StammerNwoko, Agnes I, NP  ibuprofen (ADVIL,MOTRIN) 200 MG tablet Take 800 mg by mouth every 6 (six) hours as needed for moderate pain.    Yes [provider]  mirtazapine (REMERON) 30 MG tablet Take 1 tablet (30 mg total) by mouth at bedtime. For depression 01/27/16  Yes Nwoko, Nicole KindredAgnes I, NP  pantoprazole (PROTONIX) 20 MG tablet Take 1 tablet (20 mg total) by mouth daily. For acid reflux 01/27/16  Yes Sanjuana KavaNwoko, Agnes I, NP    Family History Family History  Problem Relation Age of Onset  . Cancer Sister 2846  . COPD Sister   . Alcohol abuse Neg Hx   . Arthritis Neg Hx   . Asthma Neg Hx   . Birth defects Neg Hx   . Depression Neg Hx   . Diabetes Neg Hx   .  Drug abuse Neg Hx   . Early death Neg Hx   . Hearing loss Neg Hx   . Heart disease Neg Hx   . Hyperlipidemia Neg Hx   . Hypertension Neg Hx   . Kidney disease Neg Hx   . Learning disabilities Neg Hx   . Mental illness Neg Hx   . Mental retardation Neg Hx   . Miscarriages / Stillbirths Neg Hx   . Stroke Neg Hx   . Vision loss Neg Hx   . Varicose Veins Neg Hx     Social History Social History  Substance Use Topics  . Smoking status: Current Some Day Smoker    Packs/day: 0.25    Types: Cigarettes    Start date: 05/15/1999    Last attempt to quit: 04/12/2014  . Smokeless tobacco: Never Used  . Alcohol use 0.0 oz/week     Comment: occassional     Allergies   Depakene [divalproex sodium]   Review of  Systems Review of Systems  Constitutional:       Per HPI, otherwise negative  HENT:       Per HPI, otherwise negative  Respiratory:       Per HPI, otherwise negative  Cardiovascular:       Per HPI, otherwise negative  Gastrointestinal: Positive for nausea. Negative for abdominal pain and vomiting.  Endocrine:       Negative aside from HPI  Genitourinary:       Neg aside from HPI   Musculoskeletal:       Per HPI, otherwise negative  Skin: Negative.   Neurological: Positive for light-headedness.  Psychiatric/Behavioral:       Per history of present illness     Physical Exam Updated Vital Signs BP 90/65   Pulse 84   Temp 98.8 F (37.1 C) (Oral)   Resp 16   Ht 5\' 11"  (1.803 m)   Wt 52.2 kg (115 lb)   LMP 06/21/2017   SpO2 100%   BMI 16.04 kg/m   Physical Exam  Constitutional: She is oriented to person, place, and time. She appears well-developed and well-nourished. No distress.  HENT:  Head: Normocephalic and atraumatic.  Very poor dentition  Eyes: Conjunctivae and EOM are normal.  Cardiovascular: Normal rate and regular rhythm.   Pulmonary/Chest: Effort normal and breath sounds normal. No stridor. No respiratory distress.  Abdominal: She exhibits no distension.  Musculoskeletal: She exhibits no edema.  Neurological: She is alert and oriented to person, place, and time. No cranial nerve deficit.  Skin: Skin is warm and dry.  Psychiatric: She has a normal mood and affect.  Nursing note and vitals reviewed.    ED Treatments / Results  Labs (all labs ordered are listed, but only abnormal results are displayed) Labs Reviewed  BASIC METABOLIC PANEL - Abnormal; Notable for the following:       Result Value   Sodium 131 (*)    Chloride 98 (*)    Glucose, Bld 189 (*)    Creatinine, Ser 1.12 (*)    Calcium 8.8 (*)    All other components within normal limits  URINALYSIS, ROUTINE W REFLEX MICROSCOPIC - Abnormal; Notable for the following:    APPearance HAZY (*)     Protein, ur 30 (*)    Bacteria, UA RARE (*)    Squamous Epithelial / LPF 0-5 (*)    All other components within normal limits  CBC  I-STAT BETA HCG BLOOD, ED ([facility], [facility], [facility] ONLY)  EKG  EKG Interpretation  Date/Time:  Monday June 26 2017 23:04:06 EDT Ventricular Rate:  72 PR Interval:  138 QRS Duration: 74 QT Interval:  378 QTC Calculation: 413 R Axis:   73 Text Interpretation:  Normal sinus rhythm Possible Left atrial enlargement Baseline wander Artifact ST-t wave abnormality Abnormal ekg Confirmed by Gerhard Munch [phone]) on 06/27/2017 12:15:59 AM       Radiology Dg Chest 2 View  Result Date: 06/27/2017 CLINICAL DATA:  35 y/o  F; near-syncope. EXAM: CHEST  2 VIEW COMPARISON:  None. FINDINGS: The heart size and mediastinal contours are within normal limits. Both lungs are clear. The visualized skeletal structures are unremarkable. IMPRESSION: No active cardiopulmonary disease. Electronically Signed   By: Mitzi Hansen M.D.   On: 06/27/2017 02:01    Procedures Procedures (including critical care time)  Medications Ordered in ED Medications  ondansetron (ZOFRAN) 4 MG/2ML injection (not administered)  sodium chloride 0.9 % bolus 1,000 mL (not administered)  ondansetron (ZOFRAN) injection 4 mg (0 mg Intravenous Hold 06/27/17 0030)  ondansetron (ZOFRAN) injection 4 mg (4 mg Intravenous Given 06/26/17 2345)     Initial Impression / Assessment and Plan / ED Course  I have reviewed the triage vital signs and the nursing notes.  Pertinent labs & imaging results that were available during my care of the patient were reviewed by me and considered in my medical decision making (see chart for details).  Initial blood pressure 90 systolic, patient is receiving IV fluids.  2:44 AM Blood pressure normal. I discussed all findings the patient and her family members. Encouraged patient to follow-up with her physician to discuss medication regimens that may lead to better  compliance, and we also discussed the importance of minimizing illicit substance use. With no ongoing symptoms, improve blood pressure, patient will follow up with outpatient providers. Final Clinical Impressions(s) / ED Diagnoses   Final diagnoses:  Weakness      Gerhard Munch, MD 06/27/17 [id number]

## 2017-06-27 NOTE — ED Notes (Signed)
Pt vomiting large amounts in triage.

## 2017-06-27 NOTE — Discharge Instructions (Signed)
As discussed, your evaluation today has been largely reassuring.  But, it is important that you monitor your condition carefully, and do not hesitate to return to the ED if you develop new, or concerning changes in your condition. ? ?Otherwise, please follow-up with your physician for appropriate ongoing care. ? ?

## 2017-06-27 NOTE — ED Notes (Signed)
Patient transported to X-ray 

## 2017-06-28 ENCOUNTER — Ambulatory Visit (HOSPITAL_COMMUNITY)
Admission: EM | Admit: 2017-06-28 | Discharge: 2017-06-28 | Disposition: A | Discharge status: Home / Self Care | Payer: Self-pay | Attending: Family Medicine | Admitting: Family Medicine

## 2017-06-28 ENCOUNTER — Encounter (HOSPITAL_COMMUNITY): Payer: Self-pay | Admitting: Emergency Medicine

## 2017-06-28 DIAGNOSIS — Z3202 Encounter for pregnancy test, result negative: Secondary | ICD-10-CM

## 2017-06-28 DIAGNOSIS — N898 Other specified noninflammatory disorders of vagina: Secondary | ICD-10-CM

## 2017-06-28 LAB — POCT URINALYSIS DIP (DEVICE)
Bilirubin Urine: NEGATIVE
GLUCOSE, UA: NEGATIVE mg/dL
HGB URINE DIPSTICK: NEGATIVE
Ketones, ur: NEGATIVE mg/dL
LEUKOCYTES UA: NEGATIVE
NITRITE: NEGATIVE
PROTEIN: NEGATIVE mg/dL
Specific Gravity, Urine: >=1.03 (ref 1.005–1.030)
UROBILINOGEN UA: 1 mg/dL (ref 0.0–1.0)
pH: 6 (ref 5.0–8.0)

## 2017-06-28 LAB — POCT PREGNANCY, URINE: Preg Test, Ur: NEGATIVE

## 2017-06-28 MED ORDER — METRONIDAZOLE 500 MG PO TABS: 500.0000 mg | ORAL_TABLET | Freq: Two times a day (BID) | ORAL | 0 refills | Status: DC

## 2017-06-28 MED ORDER — FLUCONAZOLE 150 MG PO TABS: ORAL_TABLET | ORAL | 0 refills | Status: DC

## 2017-06-28 NOTE — Discharge Instructions (Signed)
At this visit you were diagnosed with the following: 1. Vaginal discharge    You are being treated for possible bacterial vaginosis (BV) today.  You have been prescribed the following prescription medications this visit:  Meds ordered this encounter  Medications   fluconazole (DIFLUCAN) 150 MG tablet    Sig: Take one tablet by mouth as a single dose.    Dispense:  1 tablet    Refill:  0   metroNIDAZOLE (FLAGYL) 500 MG tablet    Sig: Take 1 tablet (500 mg total) by mouth 2 (two) times daily.    Dispense:  14 tablet    Refill:  0    Please take and complete all medications as prescribed.  We will notify you when we receive the results of your sexually transmitted infection (STI) screening tests. If needed, we will prescribe any medications needed.  If you are not improving over the next few days or feel you are worsening please follow up here or the Emergency Department if you are unable to see your regular doctor.

## 2017-06-28 NOTE — ED Triage Notes (Addendum)
Pt reports a clear d/c with odor.  No other symptoms reported.  Pt has recurrent BV.

## 2017-06-28 NOTE — ED Provider Notes (Signed)
[facility]   [id number] 06/28/17 Arrival Time: 1424  ASSESSMENT & PLAN:  Today you were diagnosed with the following: 1. Vaginal discharge    You are being treated for possible bacterial vaginosis (BV) today.  You have been prescribed the following prescription medications this visit:  Meds ordered this encounter  Medications  . fluconazole (DIFLUCAN) 150 MG tablet    Sig: Take one tablet by mouth as a single dose.    Dispense:  1 tablet    Refill:  0  . metroNIDAZOLE (FLAGYL) 500 MG tablet    Sig: Take 1 tablet (500 mg total) by mouth 2 (two) times daily.    Dispense:  14 tablet    Refill:  0    Please take and complete all medications as prescribed.  We will notify you when we receive the results of your sexually transmitted infection (STI) screening tests. If needed, we will prescribe any medications needed.  If you are not improving over the next few days or feel you are worsening please follow up here or the Emergency Department if you are unable to see your regular doctor.   Reviewed expectations re: course of current medical issues. Questions answered. Outlined signs and symptoms indicating need for more acute intervention. Patient verbalized understanding. After Visit Summary given.   SUBJECTIVE:  Natalie Fischer is a 36 y.o. female who presents with complaint of vaginal discharge for a few days. History of BV and this feels and smells similar. Fishy odor. Thin discharge. No vaginal bleeding. No pelvic or abdominal pain. Afebrile. Sexually active with one female partner. No condom use. No self treatment.  ROS: As per HPI.   OBJECTIVE:  Vitals:   06/28/17 1516 06/28/17 1517  BP: 113/77   Pulse: 91   Resp: 16   Temp: 98.3 F (36.8 C)   TempSrc: Oral   SpO2: 100%   Weight:  115 lb (52.2 kg)  Height:  5\' 11"  (1.803 m)     General appearance: alert, cooperative, appears stated age and no distress Throat: lips, mucosa, and tongue normal; teeth  and gums normal Back: no CVA tenderness Abdomen: soft, non-tender; bowel sounds normal; no masses or organomegaly; no guarding or rebound tenderness GU: declines Skin: warm and dry Psychological:  Alert and cooperative. Normal mood and affect.  Results for orders placed or performed during the hospital encounter of 06/28/17  POCT urinalysis dip (device)  Result Value Ref Range   Glucose, UA NEGATIVE NEGATIVE mg/dL   Bilirubin Urine NEGATIVE NEGATIVE   Ketones, ur NEGATIVE NEGATIVE mg/dL   Specific Gravity, Urine >=1.030 1.005 - 1.030   Hgb urine dipstick NEGATIVE NEGATIVE   pH 6.0 5.0 - 8.0   Protein, ur NEGATIVE NEGATIVE mg/dL   Urobilinogen, UA 1.0 0.0 - 1.0 mg/dL   Nitrite NEGATIVE NEGATIVE   Leukocytes, UA NEGATIVE NEGATIVE  Pregnancy, urine POC  Result Value Ref Range   Preg Test, Ur NEGATIVE NEGATIVE    Labs Reviewed  POCT URINALYSIS DIP (DEVICE)  POCT PREGNANCY, URINE     Allergies  Allergen Reactions  . Depakene [Divalproex Sodium] Other (See Comments)    "made me feel like I was going to pass out"    Past Medical History:  Diagnosis Date  . Anxiety   . Bipolar 1 disorder (HCC)   . Carpal tunnel syndrome of right wrist 2016  . Chlamydia   . Constipation   . Ectopic pregnancy 2008 and 2014   2008 required surgery, 2nd Misoprostol  .  GERD (gastroesophageal reflux disease)   . Insomnia   . Trichomonas vaginitis   . UTI (lower urinary tract infection)           Mardella LaymanHagler, Shine Mikes, MD 06/28/17 [phone]

## 2017-07-13 ENCOUNTER — Inpatient Hospital Stay (HOSPITAL_COMMUNITY)
Admission: AD | Admit: 2017-07-13 | Discharge: 2017-07-14 | Disposition: A | Discharge status: Home / Self Care | Payer: Self-pay | Source: Ambulatory Visit | Attending: Obstetrics and Gynecology | Admitting: Obstetrics and Gynecology

## 2017-07-13 ENCOUNTER — Encounter (HOSPITAL_COMMUNITY): Payer: Self-pay | Admitting: *Deleted

## 2017-07-13 DIAGNOSIS — N730 Acute parametritis and pelvic cellulitis: Secondary | ICD-10-CM | POA: Insufficient documentation

## 2017-07-13 DIAGNOSIS — B9689 Other specified bacterial agents as the cause of diseases classified elsewhere: Secondary | ICD-10-CM | POA: Insufficient documentation

## 2017-07-13 DIAGNOSIS — Z3202 Encounter for pregnancy test, result negative: Secondary | ICD-10-CM | POA: Insufficient documentation

## 2017-07-13 DIAGNOSIS — F17210 Nicotine dependence, cigarettes, uncomplicated: Secondary | ICD-10-CM | POA: Insufficient documentation

## 2017-07-13 DIAGNOSIS — N760 Acute vaginitis: Secondary | ICD-10-CM | POA: Insufficient documentation

## 2017-07-13 LAB — CBC
HCT: 40.9 % (ref 36.0–46.0)
HEMOGLOBIN: 13.6 g/dL (ref 12.0–15.0)
MCH: 29.4 pg (ref 26.0–34.0)
MCHC: 33.3 g/dL (ref 30.0–36.0)
MCV: 88.5 fL (ref 78.0–100.0)
Platelets: 176 10*3/uL (ref 150–400)
RBC: 4.62 MIL/uL (ref 3.87–5.11)
RDW: 13.5 % (ref 11.5–15.5)
WBC: 4.9 10*3/uL (ref 4.0–10.5)

## 2017-07-13 LAB — URINALYSIS, ROUTINE W REFLEX MICROSCOPIC
Bilirubin Urine: NEGATIVE
GLUCOSE, UA: NEGATIVE mg/dL
Ketones, ur: NEGATIVE mg/dL
Leukocytes, UA: NEGATIVE
Nitrite: NEGATIVE
PH: 5 (ref 5.0–8.0)
Protein, ur: NEGATIVE mg/dL
SPECIFIC GRAVITY, URINE: 1.011 (ref 1.005–1.030)

## 2017-07-13 LAB — POCT PREGNANCY, URINE: Preg Test, Ur: NEGATIVE

## 2017-07-13 NOTE — MAU Provider Note (Signed)
History     CSN: [id number]  Arrival date and time: 07/13/17 1918  First Provider Initiated Contact with Patient 07/13/17 2345      Chief Complaint  Patient presents with  . Vaginal Bleeding   HPI Natalie Fischer is a 36 y.o. [id number] female who presents with vaginal bleeding. Patient concerned she may be having a miscarriage although she has not had a positive pregnancy test (negative in Epic on 9/3, 9/5, & today). Vaginal bleeding x 2 days. Bleeding initially consistent with menses but states today has passed some blood clots that looked like tissue. Reports intermittent sharp pains in suprapubic area since Monday. Rates pain 7/10 when it occurs. Has not treated pain. Patient is sexually active with 1 partner, does not always use condoms. Denies fever/chills, dysuria, vaginal discharge, postcoital bleeding, diarrhea, or constipation. Endorses dyspareunia for the last month & hasn't had intercourse in the last 2 weeks. Has vomited 2-3x per day since Tuesday & is currently nauseated.    Past Medical History:  Diagnosis Date  . Anxiety   . Bipolar 1 disorder (HCC)   . Carpal tunnel syndrome of right wrist 2016  . Chlamydia   . Constipation   . Ectopic pregnancy 2008 and 2014   2008 required surgery, 2nd Misoprostol  . GERD (gastroesophageal reflux disease)   . Insomnia   . Trichomonas vaginitis   . UTI (lower urinary tract infection)     Past Surgical History:  Procedure Laterality Date  . ECTOPIC PREGNANCY SURGERY Right 2008    Family History  Problem Relation Age of Onset  . Cancer Sister 51  . COPD Sister   . Alcohol abuse Neg Hx   . Arthritis Neg Hx   . Asthma Neg Hx   . Birth defects Neg Hx   . Depression Neg Hx   . Diabetes Neg Hx   . Drug abuse Neg Hx   . Early death Neg Hx   . Hearing loss Neg Hx   . Heart disease Neg Hx   . Hyperlipidemia Neg Hx   . Hypertension Neg Hx   . Kidney disease Neg Hx   . Learning disabilities Neg Hx   . Mental illness Neg Hx    . Mental retardation Neg Hx   . Miscarriages / Stillbirths Neg Hx   . Stroke Neg Hx   . Vision loss Neg Hx   . Varicose Veins Neg Hx     Social History  Substance Use Topics  . Smoking status: Current Some Day Smoker    Packs/day: 0.25    Types: Cigarettes    Start date: 05/15/1999    Last attempt to quit: 04/12/2014  . Smokeless tobacco: Never Used  . Alcohol use 0.0 oz/week     Comment: occassional    Allergies:  Allergies  Allergen Reactions  . Depakene [Divalproex Sodium] Other (See Comments)    "made me feel like I was going to pass out"    Prescriptions Prior to Admission  Medication Sig Dispense Refill Last Dose  . ARIPiprazole (ABILIFY) 15 MG tablet Take 15 mg by mouth at bedtime.   Past Month at Unknown time  . busPIRone (BUSPAR) 5 MG tablet Take 5 mg by mouth 3 (three) times daily.   07/13/2017 at Unknown time  . feeding supplement, ENSURE ENLIVE, (ENSURE ENLIVE) LIQD For nutritional supplement when unable to eat max of three per day 237 mL 12 Past Week at Unknown time  . fluconazole (DIFLUCAN) 150 MG tablet Take  one tablet by mouth as a single dose. 1 tablet 0 Past Week at Unknown time  . ibuprofen (ADVIL,MOTRIN) 200 MG tablet Take 800 mg by mouth every 6 (six) hours as needed for moderate pain.    07/13/2017 at Unknown time  . metroNIDAZOLE (FLAGYL) 500 MG tablet Take 1 tablet (500 mg total) by mouth 2 (two) times daily. 14 tablet 0 Past Week at Unknown time  . mirtazapine (REMERON) 30 MG tablet Take 1 tablet (30 mg total) by mouth at bedtime. For depression 30 tablet 0 07/12/2017 at Unknown time  . pantoprazole (PROTONIX) 20 MG tablet Take 1 tablet (20 mg total) by mouth daily. For acid reflux 30 tablet 0 Past Week at Unknown time  . hydrOXYzine (ATARAX/VISTARIL) 25 MG tablet Take 1 tablet (25 mg total) by mouth every 6 (six) hours as needed for anxiety (sleep). 60 tablet 0 06/27/2017 at Unknown time    Review of Systems  Constitutional: Negative.   Gastrointestinal:  Positive for abdominal pain, nausea and vomiting. Negative for abdominal distention, constipation and diarrhea.  Genitourinary: Positive for dyspareunia and vaginal bleeding. Negative for dysuria and vaginal discharge.   Physical Exam   Blood pressure 117/86, pulse 95, temperature 98.1 F (36.7 C), temperature source Oral, resp. rate 17, height  (1.803 m), weight 117 lb (53.1 kg), last menstrual period 07/11/2017, SpO2 98 %.  Physical Exam  Nursing note and vitals reviewed. Constitutional: She is oriented to person, place, and time. She appears well-developed and well-nourished. No distress.  HENT:  Head: Normocephalic and atraumatic.  Eyes: Conjunctivae are normal. Right eye exhibits no discharge. Left eye exhibits no discharge. No scleral icterus.  Neck: Normal range of motion.  Respiratory: Effort normal. No respiratory distress.  GI: Soft. There is tenderness in the suprapubic area. There is no rigidity, no rebound and no guarding.  Genitourinary: Uterus normal. Cervix exhibits motion tenderness and discharge. Cervix exhibits no friability. Right adnexum displays no mass and no tenderness. Left adnexum displays no mass and no tenderness. There is bleeding (small amount of dark red mucoid bleeding at os) in the vagina. Vaginal discharge (moderate amount of clumpy dark brown malodorous discharge adherant to vaginal walls) found.  Neurological: She is alert and oriented to person, place, and time.  Skin: Skin is warm and dry. She is not diaphoretic.  Psychiatric: She has a normal mood and affect. Her behavior is normal. Judgment and thought content normal.    MAU Course  Procedures Results for orders placed or performed during the hospital encounter of 07/13/17 (from the past 24 hour(s))  Urinalysis, Routine w reflex microscopic     Status: Abnormal   Collection Time: 07/13/17  7:55 PM  Result Value Ref Range   Color, Urine YELLOW YELLOW   APPearance CLEAR CLEAR   Specific  Gravity, Urine 1.011 1.005 - 1.030   pH 5.0 5.0 - 8.0   Glucose, UA NEGATIVE NEGATIVE mg/dL   Hgb urine dipstick MODERATE (A) NEGATIVE   Bilirubin Urine NEGATIVE NEGATIVE   Ketones, ur NEGATIVE NEGATIVE mg/dL   Protein, ur NEGATIVE NEGATIVE mg/dL   Nitrite NEGATIVE NEGATIVE   Leukocytes, UA NEGATIVE NEGATIVE   RBC / HPF 0-5 0 - 5 RBC/hpf   WBC, UA 0-5 0 - 5 WBC/hpf   Bacteria, UA RARE (A) NONE SEEN   Squamous Epithelial / LPF 0-5 (A) NONE SEEN   Mucus PRESENT   Pregnancy, urine POC     Status: None   Collection Time: 07/13/17  8:20  PM  Result Value Ref Range   Preg Test, Ur NEGATIVE NEGATIVE  CBC     Status: None   Collection Time: 07/13/17  9:30 PM  Result Value Ref Range   WBC 4.9 4.0 - 10.5 K/uL   RBC 4.62 3.87 - 5.11 MIL/uL   Hemoglobin 13.6 12.0 - 15.0 g/dL   HCT 16.1 09.6 - 04.5 %   MCV 88.5 78.0 - 100.0 fL   MCH 29.4 26.0 - 34.0 pg   MCHC 33.3 30.0 - 36.0 g/dL   RDW 40.9 81.1 - 91.4 %   Platelets 176 150 - 400 K/uL  Wet prep, genital     Status: Abnormal   Collection Time: 07/14/17 12:23 AM  Result Value Ref Range   Yeast Wet Prep HPF POC NONE SEEN NONE SEEN   Trich, Wet Prep NONE SEEN NONE SEEN   Clue Cells Wet Prep HPF POC PRESENT (A) NONE SEEN   WBC, Wet Prep HPF POC FEW (A) NONE SEEN   Sperm NONE SEEN     MDM UPT negative CBC Toradol 60 mg IM & zofran 8 mg ODT Will tx for pid d/t cmt & foul discharge. Rocephin 25 mg IM given in MAU. Will d/c home with doxy & flagyl  Assessment and Plan  A: 1. PID (acute pelvic inflammatory disease)   2. BV (bacterial vaginosis)   3. Pregnancy examination or test, negative result    P: Discharge home Rx flagyl & doxy Discussed reasons to return to MAU F/u with PCP No intercourse x 3 weeks  GC/CT pending   Judeth Horn 07/13/2017, 11:43 PM

## 2017-07-13 NOTE — MAU Note (Signed)
Pt states she started having lower abdominal cramping and vaginal bleeding and passing "tissue" that started yesterday. States she thinks she is having a miscarriage. Has not taken a pregnancy test. Also has a lot of nausea and fatigue. LMP: 07/11/2017.

## 2017-07-14 LAB — WET PREP, GENITAL
Sperm: NONE SEEN
Trich, Wet Prep: NONE SEEN
Yeast Wet Prep HPF POC: NONE SEEN

## 2017-07-14 MED ORDER — METRONIDAZOLE 500 MG PO TABS: 500.0000 mg | ORAL_TABLET | Freq: Two times a day (BID) | ORAL | 0 refills | Status: DC

## 2017-07-14 MED ORDER — FLUCONAZOLE 150 MG PO TABS: 150.0000 mg | ORAL_TABLET | Freq: Every day | ORAL | 0 refills | Status: DC

## 2017-07-14 MED ORDER — CEFTRIAXONE SODIUM 250 MG IJ SOLR
250.0000 mg | Freq: Once | INTRAMUSCULAR | Status: AC
  Administered 2017-07-14: 250 mg via INTRAMUSCULAR
  Filled 2017-07-14: qty 250

## 2017-07-14 MED ORDER — ONDANSETRON 8 MG PO TBDP
8.0000 mg | ORAL_TABLET | Freq: Once | ORAL | Status: AC
  Administered 2017-07-14: 8 mg via ORAL
  Filled 2017-07-14: qty 1

## 2017-07-14 MED ORDER — KETOROLAC TROMETHAMINE 60 MG/2ML IM SOLN
60.0000 mg | Freq: Once | INTRAMUSCULAR | Status: AC
  Administered 2017-07-14: 60 mg via INTRAMUSCULAR
  Filled 2017-07-14: qty 2

## 2017-07-14 MED ORDER — DOXYCYCLINE HYCLATE 100 MG PO CAPS: 100.0000 mg | ORAL_CAPSULE | Freq: Two times a day (BID) | ORAL | 0 refills | Status: DC

## 2017-07-14 NOTE — Discharge Instructions (Signed)
Pelvic Inflammatory Disease Pelvic inflammatory disease (PID) refers to an infection in some or all of the female organs. The infection can be in the uterus, ovaries, fallopian tubes, or the surrounding tissues in the pelvis. PID can cause abdominal or pelvic pain that comes on suddenly (acute pelvic pain). PID is a serious infection because it can lead to lasting (chronic) pelvic pain or the inability to have children (infertility). What are the causes? This condition is most often caused by an infection that is spread during sexual contact. However, the infection can also be caused by the normal bacteria that are found in the vaginal tissues if these bacteria travel upward into the reproductive organs. PID can also occur following:  The birth of a baby.  A miscarriage.  An abortion.  Major pelvic surgery.  The use of an intrauterine device (IUD).  A sexual assault.  What increases the risk? This condition is more likely to develop in women who:  Are younger than 36 years of age.  Are sexually active at [facility] age.  Use nonbarrier contraception.  Have multiple sexual partners.  Have sex with someone who has symptoms of an STD (sexually transmitted disease).  Use oral contraception.  At times, certain behaviors can also increase the possibility of getting PID, such as:  Using a vaginal douche.  Having an IUD in place.  What are the signs or symptoms? Symptoms of this condition include:  Abdominal or pelvic pain.  Fever.  Chills.  Abnormal vaginal discharge.  Abnormal uterine bleeding.  Unusual pain shortly after the end of a menstrual period.  Painful urination.  Pain with sexual intercourse.  Nausea and vomiting.  How is this diagnosed? To diagnose this condition, your health care provider will do a physical exam and take your medical history. A pelvic exam typically reveals great tenderness in the uterus and the surrounding pelvic tissues. You may also  have tests, such as:  Lab tests, including a pregnancy test, blood tests, and urine test.  Culture tests of the vagina and cervix to check for an STD.  Ultrasound.  A laparoscopic procedure to look inside the pelvis.  Examining vaginal secretions under a microscope.  How is this treated? Treatment for this condition may involve one or more approaches.  Antibiotic medicines may be prescribed to be taken by mouth.  Sexual partners may need to be treated if the infection is caused by an STD.  For more severe cases, hospitalization may be needed to give antibiotics directly into a vein through an IV tube.  Surgery may be needed if other treatments do not help, but this is rare.  It may take weeks until you are completely well. If you are diagnosed with PID, you should also be checked for human immunodeficiency virus (HIV). Your health care provider may test you for infection again 3 months after treatment. You should not have unprotected sex. Follow these instructions at home:  Take over-the-counter and prescription medicines only as told by your health care provider.  If you were prescribed an antibiotic medicine, take it as told by your health care provider. Do not stop taking the antibiotic even if you start to feel better.  Do not have sexual intercourse until treatment is completed or as told by your health care provider. If PID is confirmed, your recent sexual partners will need treatment, especially if you had unprotected sex.  Keep all follow-up visits as told by your health care provider. This is important. Contact a health care  provider if:  You have increased or abnormal vaginal discharge.  Your pain does not improve.  You vomit.  You have a fever.  You cannot tolerate your medicines.  Your partner has an STD.  You have pain when you urinate. Get help right away if:  You have increased abdominal or pelvic pain.  You have chills.  Your symptoms are not  better in 72 hours even with treatment. This information is not intended to replace advice given to you by your health care provider. Make sure you discuss any questions you have with your health care provider. Document Released: 10/10/2005 Document Revised: 03/17/2016 Document Reviewed: 11/17/2014 Elsevier Interactive Patient Education  2018 [employer]     Bacterial Vaginosis Bacterial vaginosis is an infection of the vagina. It happens when too many germs (bacteria) grow in the vagina. This infection puts you at risk for infections from sex (STIs). Treating this infection can lower your risk for some STIs. You should also treat this if you are pregnant. It can cause your baby to be born early. Follow these instructions at home: Medicines  Take over-the-counter and prescription medicines only as told by your doctor.  Take or use your antibiotic medicine as told by your doctor. Do not stop taking or using it even if you start to feel better. General instructions  If you your sexual partner is a woman, tell her that you have this infection. She needs to get treatment if she has symptoms. If you have a female partner, he does not need to be treated.  During treatment: ? Avoid sex. ? Do not douche. ? Avoid alcohol as told. ? Avoid breastfeeding as told.  Drink enough fluid to keep your pee (urine) clear or pale yellow.  Keep your vagina and butt (rectum) clean. ? Wash the area with warm water every day. ? Wipe from front to back after you use the toilet.  Keep all follow-up visits as told by your doctor. This is important. Preventing this condition  Do not douche.  Use only warm water to wash around your vagina.  Use protection when you have sex. This includes: ? Latex condoms. ? Dental dams.  Limit how many people you have sex with. It is best to only have sex with the same person (be monogamous).  Get tested for STIs. Have your partner get tested.  Wear underwear that  is cotton or lined with cotton.  Avoid tight pants and pantyhose. This is most important in summer.  Do not use any products that have nicotine or tobacco in them. These include cigarettes and e-cigarettes. If you need help quitting, ask your doctor.  Do not use illegal drugs.  Limit how much alcohol you drink. Contact a doctor if:  Your symptoms do not get better, even after you are treated.  You have more discharge or pain when you pee (urinate).  You have a fever.  You have pain in your belly (abdomen).  You have pain with sex.  Your bleed from your vagina between periods. Summary  This infection happens when too many germs (bacteria) grow in the vagina.  Treating this condition can lower your risk for some infections from sex (STIs).  You should also treat this if you are pregnant. It can cause early (premature) birth.  Do not stop taking or using your antibiotic medicine even if you start to feel better. This information is not intended to replace advice given to you by your health care provider. Make sure  you discuss any questions you have with your health care provider. Document Released: 07/19/2008 Document Revised: 06/25/2016 Document Reviewed: 06/25/2016 Elsevier Interactive Patient Education  2017 [employer].

## 2017-07-17 LAB — GC/CHLAMYDIA PROBE AMP (~~LOC~~) NOT AT ARMC
CHLAMYDIA, DNA PROBE: NEGATIVE
NEISSERIA GONORRHEA: NEGATIVE

## 2017-08-07 ENCOUNTER — Emergency Department (HOSPITAL_COMMUNITY)
Admission: EM | Admit: 2017-08-07 | Discharge: 2017-08-07 | Disposition: A | Discharge status: Other Acute Inpatient Hospital | Payer: Self-pay | Attending: Emergency Medicine | Admitting: Emergency Medicine

## 2017-08-07 ENCOUNTER — Encounter (HOSPITAL_COMMUNITY): Payer: Self-pay

## 2017-08-07 ENCOUNTER — Encounter (HOSPITAL_COMMUNITY): Payer: Self-pay | Admitting: Emergency Medicine

## 2017-08-07 ENCOUNTER — Inpatient Hospital Stay (HOSPITAL_COMMUNITY)
Admission: AD | Admit: 2017-08-07 | Discharge: 2017-08-10 | DRG: 885 | Disposition: A | Discharge status: Home / Self Care | Payer: Federal, State, Local not specified - Other | Source: Intra-hospital | Attending: Psychiatry | Admitting: Psychiatry

## 2017-08-07 DIAGNOSIS — R45851 Suicidal ideations: Secondary | ICD-10-CM | POA: Diagnosis present

## 2017-08-07 DIAGNOSIS — F1220 Cannabis dependence, uncomplicated: Secondary | ICD-10-CM | POA: Diagnosis present

## 2017-08-07 DIAGNOSIS — F1920 Other psychoactive substance dependence, uncomplicated: Secondary | ICD-10-CM

## 2017-08-07 DIAGNOSIS — F419 Anxiety disorder, unspecified: Secondary | ICD-10-CM | POA: Diagnosis present

## 2017-08-07 DIAGNOSIS — Z046 Encounter for general psychiatric examination, requested by authority: Secondary | ICD-10-CM | POA: Insufficient documentation

## 2017-08-07 DIAGNOSIS — Z79899 Other long term (current) drug therapy: Secondary | ICD-10-CM | POA: Diagnosis not present

## 2017-08-07 DIAGNOSIS — F3132 Bipolar disorder, current episode depressed, moderate: Secondary | ICD-10-CM | POA: Diagnosis present

## 2017-08-07 DIAGNOSIS — F17210 Nicotine dependence, cigarettes, uncomplicated: Secondary | ICD-10-CM | POA: Diagnosis present

## 2017-08-07 DIAGNOSIS — R440 Auditory hallucinations: Secondary | ICD-10-CM | POA: Insufficient documentation

## 2017-08-07 DIAGNOSIS — F39 Unspecified mood [affective] disorder: Secondary | ICD-10-CM | POA: Diagnosis not present

## 2017-08-07 DIAGNOSIS — F314 Bipolar disorder, current episode depressed, severe, without psychotic features: Secondary | ICD-10-CM | POA: Diagnosis present

## 2017-08-07 DIAGNOSIS — F1420 Cocaine dependence, uncomplicated: Secondary | ICD-10-CM | POA: Insufficient documentation

## 2017-08-07 DIAGNOSIS — G4700 Insomnia, unspecified: Secondary | ICD-10-CM | POA: Diagnosis not present

## 2017-08-07 DIAGNOSIS — R5383 Other fatigue: Secondary | ICD-10-CM | POA: Diagnosis not present

## 2017-08-07 DIAGNOSIS — K219 Gastro-esophageal reflux disease without esophagitis: Secondary | ICD-10-CM | POA: Diagnosis present

## 2017-08-07 LAB — RAPID URINE DRUG SCREEN, HOSP PERFORMED
Amphetamines: POSITIVE — AB
BARBITURATES: NOT DETECTED
Benzodiazepines: NOT DETECTED
COCAINE: POSITIVE — AB
Opiates: NOT DETECTED
TETRAHYDROCANNABINOL: POSITIVE — AB

## 2017-08-07 LAB — CBC
HCT: 37 % (ref 36.0–46.0)
HEMOGLOBIN: 12.4 g/dL (ref 12.0–15.0)
MCH: 29.5 pg (ref 26.0–34.0)
MCHC: 33.5 g/dL (ref 30.0–36.0)
MCV: 87.9 fL (ref 78.0–100.0)
Platelets: 202 10*3/uL (ref 150–400)
RBC: 4.21 MIL/uL (ref 3.87–5.11)
RDW: 14.1 % (ref 11.5–15.5)
WBC: 5.4 10*3/uL (ref 4.0–10.5)

## 2017-08-07 LAB — COMPREHENSIVE METABOLIC PANEL
ALK PHOS: 44 U/L (ref 38–126)
ALT: 10 U/L — AB (ref 14–54)
ANION GAP: 6 (ref 5–15)
AST: 18 U/L (ref 15–41)
Albumin: 3.7 g/dL (ref 3.5–5.0)
BILIRUBIN TOTAL: 0.6 mg/dL (ref 0.3–1.2)
BUN: 12 mg/dL (ref 6–20)
CALCIUM: 8.7 mg/dL — AB (ref 8.9–10.3)
CO2: 28 mmol/L (ref 22–32)
CREATININE: 0.93 mg/dL (ref 0.44–1.00)
Chloride: 103 mmol/L (ref 101–111)
Glucose, Bld: 86 mg/dL (ref 65–99)
Potassium: 3.5 mmol/L (ref 3.5–5.1)
Sodium: 137 mmol/L (ref 135–145)
TOTAL PROTEIN: 6.9 g/dL (ref 6.5–8.1)

## 2017-08-07 LAB — SALICYLATE LEVEL

## 2017-08-07 LAB — I-STAT BETA HCG BLOOD, ED (MC, WL, AP ONLY): I-stat hCG, quantitative: <5 m[IU]/mL (ref <5)

## 2017-08-07 LAB — ACETAMINOPHEN LEVEL

## 2017-08-07 LAB — ETHANOL

## 2017-08-07 MED ORDER — ZOLPIDEM TARTRATE 5 MG PO TABS
5.0000 mg | ORAL_TABLET | Freq: Every evening | ORAL | Status: DC | PRN
Start: 2017-08-07 — End: 2017-08-07

## 2017-08-07 MED ORDER — PANTOPRAZOLE SODIUM 20 MG PO TBEC
20.0000 mg | DELAYED_RELEASE_TABLET | Freq: Every day | ORAL | Status: DC
  Administered 2017-08-08 – 2017-08-10 (×3): 20 mg via ORAL
  Filled 2017-08-07 (×5): qty 1

## 2017-08-07 MED ORDER — ALUM & MAG HYDROXIDE-SIMETH 200-200-20 MG/5ML PO SUSP: 30.0000 mL | Freq: Four times a day (QID) | ORAL | Status: DC | PRN

## 2017-08-07 MED ORDER — BUSPIRONE HCL 5 MG PO TABS
5.0000 mg | ORAL_TABLET | Freq: Three times a day (TID) | ORAL | Status: DC
  Administered 2017-08-08 – 2017-08-10 (×7): 5 mg via ORAL
  Filled 2017-08-07 (×2): qty 1
  Filled 2017-08-07: qty 21
  Filled 2017-08-07: qty 1
  Filled 2017-08-07: qty 21
  Filled 2017-08-07 (×2): qty 1
  Filled 2017-08-07 (×4): qty 21
  Filled 2017-08-07 (×5): qty 1

## 2017-08-07 MED ORDER — ARIPIPRAZOLE 15 MG PO TABS
15.0000 mg | ORAL_TABLET | Freq: Every day | ORAL | Status: DC
  Administered 2017-08-07 – 2017-08-09 (×3): 15 mg via ORAL
  Filled 2017-08-07: qty 7
  Filled 2017-08-07 (×3): qty 1
  Filled 2017-08-07: qty 7
  Filled 2017-08-07 (×2): qty 1

## 2017-08-07 MED ORDER — HYDROXYZINE HCL 25 MG PO TABS
25.0000 mg | ORAL_TABLET | Freq: Four times a day (QID) | ORAL | Status: DC | PRN
  Administered 2017-08-08 – 2017-08-10 (×4): 25 mg via ORAL
  Filled 2017-08-07 (×4): qty 1
  Filled 2017-08-07: qty 10

## 2017-08-07 MED ORDER — INFLUENZA VAC SPLIT QUAD 0.5 ML IM SUSY
0.5000 mL | PREFILLED_SYRINGE | INTRAMUSCULAR | Status: DC
  Filled 2017-08-07: qty 0.5

## 2017-08-07 MED ORDER — MAGNESIUM HYDROXIDE 400 MG/5ML PO SUSP: 30.0000 mL | Freq: Every day | ORAL | Status: DC | PRN

## 2017-08-07 MED ORDER — PNEUMOCOCCAL VAC POLYVALENT 25 MCG/0.5ML IJ INJ: 0.5000 mL | INJECTION | INTRAMUSCULAR | Status: DC

## 2017-08-07 MED ORDER — PANTOPRAZOLE SODIUM 20 MG PO TBEC
20.0000 mg | DELAYED_RELEASE_TABLET | Freq: Every day | ORAL | Status: DC
  Filled 2017-08-07 (×2): qty 1

## 2017-08-07 MED ORDER — ALUM & MAG HYDROXIDE-SIMETH 200-200-20 MG/5ML PO SUSP: 30.0000 mL | ORAL | Status: DC | PRN

## 2017-08-07 MED ORDER — MIRTAZAPINE 30 MG PO TABS
30.0000 mg | ORAL_TABLET | Freq: Every day | ORAL | Status: DC
  Administered 2017-08-07 – 2017-08-09 (×3): 30 mg via ORAL
  Filled 2017-08-07: qty 1
  Filled 2017-08-07: qty 7
  Filled 2017-08-07 (×3): qty 1
  Filled 2017-08-07: qty 7
  Filled 2017-08-07: qty 1

## 2017-08-07 MED ORDER — ACETAMINOPHEN 325 MG PO TABS: 650.0000 mg | ORAL_TABLET | ORAL | Status: DC | PRN

## 2017-08-07 MED ORDER — NICOTINE 7 MG/24HR TD PT24: 7.0000 mg | MEDICATED_PATCH | Freq: Every day | TRANSDERMAL | Status: DC

## 2017-08-07 MED ORDER — TRAZODONE HCL 50 MG PO TABS
50.0000 mg | ORAL_TABLET | Freq: Every evening | ORAL | Status: DC | PRN
  Filled 2017-08-07: qty 7

## 2017-08-07 MED ORDER — NICOTINE 7 MG/24HR TD PT24
7.0000 mg | MEDICATED_PATCH | Freq: Every day | TRANSDERMAL | Status: DC
  Administered 2017-08-09: 7 mg via TRANSDERMAL
  Filled 2017-08-07 (×5): qty 1

## 2017-08-07 MED ORDER — ONDANSETRON HCL 4 MG PO TABS: 4.0000 mg | ORAL_TABLET | Freq: Three times a day (TID) | ORAL | Status: DC | PRN

## 2017-08-07 MED ORDER — ARIPIPRAZOLE 5 MG PO TABS: 15.0000 mg | ORAL_TABLET | Freq: Every day | ORAL | Status: DC

## 2017-08-07 MED ORDER — MIRTAZAPINE 30 MG PO TABS: 30.0000 mg | ORAL_TABLET | Freq: Every day | ORAL | Status: DC

## 2017-08-07 MED ORDER — ACETAMINOPHEN 325 MG PO TABS: 650.0000 mg | ORAL_TABLET | Freq: Four times a day (QID) | ORAL | Status: DC | PRN

## 2017-08-07 MED ORDER — BUSPIRONE HCL 10 MG PO TABS
5.0000 mg | ORAL_TABLET | Freq: Three times a day (TID) | ORAL | Status: DC
  Administered 2017-08-07: 5 mg via ORAL
  Filled 2017-08-07: qty 1

## 2017-08-07 NOTE — ED Notes (Signed)
Patient alert and oriented. Patient states she is having thoughts of harming herself but contracts for safety while on unit. Patient denies A/V/H.Patient with Q 15 minute checks in progress and remains safe on unit. Monitoring of patient continues.

## 2017-08-07 NOTE — Progress Notes (Signed)
Natalie Fischer is a 36 year old female being admitted voluntarily to [facility] from [facility].  She came in for suicidal ideation with thoughts to OD and substance abuse.  She reported cocaine and marijuana abuse.  Stressors include issues with her mother, substance abuse, financial issues and possibility of losing her job.  She has history of auditory hallucinations and is currently being treated at [facility] with Abilify Maintena, Buspar and Remeron.  She denies any medical issues and appears to be in no physical distress.  Oriented her to the unit.  Admission paperwork completed and signed.  Belongings searched and secured in locker # 39-see belong.  Skin assessment completed and no skin issues noted.  Q 15 minute checks initiated for safety.  We will monitor the progress towards her goals.

## 2017-08-07 NOTE — ED Notes (Signed)
Pt was wanded, Pt and belongings were taken back to [facility].

## 2017-08-07 NOTE — Tx Team (Signed)
Initial Treatment Plan 08/07/2017 10:34 PM [name] ZOX:[id number]    PATIENT STRESSORS: Financial difficulties Marital or family conflict Substance abuse  Finding somewhere to stay   PATIENT STRENGTHS: Capable of independent living Manufacturing systems engineer Motivation for treatment/growth Physical Health   PATIENT IDENTIFIED PROBLEMS: Depression  Suicidal ideation  Substance abuse  "Me get better, for one"  "Get help finding avenues in areas that I need help with"             DISCHARGE CRITERIA:  Improved stabilization in mood, thinking, and/or behavior Need for constant or close observation no longer present Verbal commitment to aftercare and medication compliance  PRELIMINARY DISCHARGE PLAN: Outpatient therapy Medication management, long term drug treatment  PATIENT/FAMILY INVOLVEMENT: This treatment plan has been presented to and reviewed with the patient, Natalie Fischer.  The patient and family have been given the opportunity to ask questions and make suggestions.  Levin Bacon, RN 08/07/2017, 10:34 PM

## 2017-08-07 NOTE — ED Notes (Signed)
Pt presents with SI, plan to take pills, Denies HI or AVH.  Feeling hopeless.  Pt admits to cocaine use this past Saturday.  Pt diagnosed with Bipolar DO.  A&O x 3, no distress noted, calm & cooperative.  Monitoring for safety, Q 15 min checks in effect.  Safety check for contraband completed, no items found.

## 2017-08-07 NOTE — ED Provider Notes (Signed)
[facility]-EMERGENCY DEPT Provider Note   CSN: [id number] Arrival date & time: 08/07/17  1352     History   Chief Complaint Chief Complaint  Patient presents with  . Suicidal    HPI Natalie Fischer is a 36 y.o. female.  The history is provided by the patient.  She has a history of depression, and has had intermittent suicidal thoughts over the past several years. Her last 3 months, symptoms have gotten worse. This coincides with when she went back to using cocaine. She admits to crying spells, anhedonia. She is not able to get to sleep at night.she has suicidal thoughts of taking an overdose of pills. She does have access to the pills. She endorses auditory hallucinations telling her to take the pills. She states she has been compliant with her medications.  Past Medical History:  Diagnosis Date  . Anxiety   . Bipolar 1 disorder (HCC)   . Carpal tunnel syndrome of right wrist 2016  . Chlamydia   . Constipation   . Ectopic pregnancy 2008 and 2014   2008 required surgery, 2nd Misoprostol  . GERD (gastroesophageal reflux disease)   . Insomnia   . Trichomonas vaginitis   . UTI (lower urinary tract infection)     Patient Active Problem List   Diagnosis Date Noted  . Mild benzodiazepine use disorder (HCC) 01/23/2016  . Cocaine abuse (HCC) 01/22/2016  . Bipolar 1 disorder, depressed, moderate (HCC) 02/20/2015  . Cocaine use disorder, severe, dependence (HCC)   . Cannabis use disorder, moderate, dependence (HCC)   . ANEMIA 05/11/2010  . LOSS OF WEIGHT 03/07/2008  . ALLERGIC RHINITIS 01/18/2008  . INSOMNIA 01/18/2008  . ANXIETY 08/31/2007  . GERD 06/18/2007  . CONSTIPATION 06/18/2007    Past Surgical History:  Procedure Laterality Date  . ECTOPIC PREGNANCY SURGERY Right 2008    OB History    Gravida Para Term Preterm AB Living   2       2 0   SAB TAB Ectopic Multiple Live Births       2           Home Medications    Prior to Admission  medications   Medication Sig Start Date End Date Taking? Authorizing Provider  ARIPiprazole (ABILIFY) 15 MG tablet Take 15 mg by mouth at bedtime.    [provider]  busPIRone (BUSPAR) 5 MG tablet Take 5 mg by mouth 3 (three) times daily.    [provider]  doxycycline (VIBRAMYCIN) 100 MG capsule Take 1 capsule (100 mg total) by mouth 2 (two) times daily. 07/14/17   Judeth Horn, NP  feeding supplement, ENSURE ENLIVE, (ENSURE ENLIVE) LIQD For nutritional supplement when unable to eat max of three per day 01/27/16   Rachael Fee, MD  fluconazole (DIFLUCAN) 150 MG tablet Take 1 tablet (150 mg total) by mouth daily. 07/14/17   Judeth Horn, NP  hydrOXYzine (ATARAX/VISTARIL) 25 MG tablet Take 1 tablet (25 mg total) by mouth every 6 (six) hours as needed for anxiety (sleep). 01/27/16   Armandina Stammer I, NP  ibuprofen (ADVIL,MOTRIN) 200 MG tablet Take 800 mg by mouth every 6 (six) hours as needed for moderate pain.     [provider]  metroNIDAZOLE (FLAGYL) 500 MG tablet Take 1 tablet (500 mg total) by mouth 2 (two) times daily. 07/14/17   Judeth Horn, NP  mirtazapine (REMERON) 30 MG tablet Take 1 tablet (30 mg total) by mouth at bedtime. For depression 01/27/16  Armandina Stammer I, NP  pantoprazole (PROTONIX) 20 MG tablet Take 1 tablet (20 mg total) by mouth daily. For acid reflux 01/27/16   Sanjuana Kava, NP    Family History Family History  Problem Relation Age of Onset  . Cancer Sister 38  . COPD Sister   . Alcohol abuse Neg Hx   . Arthritis Neg Hx   . Asthma Neg Hx   . Birth defects Neg Hx   . Depression Neg Hx   . Diabetes Neg Hx   . Drug abuse Neg Hx   . Early death Neg Hx   . Hearing loss Neg Hx   . Heart disease Neg Hx   . Hyperlipidemia Neg Hx   . Hypertension Neg Hx   . Kidney disease Neg Hx   . Learning disabilities Neg Hx   . Mental illness Neg Hx   . Mental retardation Neg Hx   . Miscarriages / Stillbirths Neg Hx   . Stroke Neg Hx   . Vision loss  Neg Hx   . Varicose Veins Neg Hx     Social History Social History  Substance Use Topics  . Smoking status: Current Some Day Smoker    Packs/day: 0.25    Types: Cigarettes    Start date: 05/15/1999    Last attempt to quit: 04/12/2014  . Smokeless tobacco: Never Used  . Alcohol use 0.0 oz/week     Comment: occassional     Allergies   Depakene [divalproex sodium]   Review of Systems Review of Systems  All other systems reviewed and are negative.    Physical Exam Updated Vital Signs BP 108/70 (BP Location: Left Arm)   Pulse 96   Temp 98.3 F (36.8 C) (Oral)   Resp 16   Ht  (1.803 m)   Wt 53.5 kg (118 lb)   LMP 08/06/2017   SpO2 100%   BMI 16.46 kg/m   Physical Exam  Nursing note and vitals reviewed.  36 year old female, resting comfortably and in no acute distress. Vital signs are normal. Oxygen saturation is 100%, which is normal. Head is normocephalic and atraumatic. PERRLA, EOMI. Oropharynx is clear. Neck is nontender and supple without adenopathy or JVD. Back is nontender and there is no CVA tenderness. Lungs are clear without rales, wheezes, or rhonchi. Chest is nontender. Heart has regular rate and rhythm without murmur. Abdomen is soft, flat, nontender without masses or hepatosplenomegaly and peristalsis is normoactive. Extremities have no cyanosis or edema, full range of motion is present. Skin is warm and dry without rash. Neurologic: Mental status is normal, cranial nerves are intact, there are no motor or sensory deficits. Psychiatric: Depressed affect, but makes good eye contact, and speaks with normal and inflections.  ED Treatments / Results  Labs (all labs ordered are listed, but only abnormal results are displayed) Labs Reviewed  COMPREHENSIVE METABOLIC PANEL - Abnormal; Notable for the following:       Result Value   Calcium 8.7 (*)    ALT 10 (*)    All other components within normal limits  ACETAMINOPHEN LEVEL - Abnormal; Notable  for the following:    Acetaminophen (Tylenol), Serum <10 (*)    All other components within normal limits  RAPID URINE DRUG SCREEN, HOSP PERFORMED - Abnormal; Notable for the following:    Cocaine POSITIVE (*)    Amphetamines POSITIVE (*)    Tetrahydrocannabinol POSITIVE (*)    All other components within normal limits  ETHANOL  SALICYLATE LEVEL  CBC  I-STAT BETA HCG BLOOD, ED ([facility], [facility], [facility] ONLY)  I-STAT BETA HCG BLOOD, ED ([facility], [facility], [facility] ONLY)   Procedures Procedures (including critical care time)  Medications Ordered in ED Medications  nicotine (NICODERM CQ - dosed in mg/24 hr) patch 7 mg (not administered)  alum & mag hydroxide-simeth (MAALOX/MYLANTA) 200-200-20 MG/5ML suspension 30 mL (not administered)  ondansetron (ZOFRAN) tablet 4 mg (not administered)  zolpidem (AMBIEN) tablet 5 mg (not administered)  acetaminophen (TYLENOL) tablet 650 mg (not administered)  ARIPiprazole (ABILIFY) tablet 15 mg (not administered)  busPIRone (BUSPAR) tablet 5 mg (not administered)  mirtazapine (REMERON) tablet 30 mg (not administered)  pantoprazole (PROTONIX) EC tablet 20 mg (not administered)     Initial Impression / Assessment and Plan / ED Course  I have reviewed the triage vital signs and the nursing notes.  Pertinent labs & imaging results that were available during my care of the patient were reviewed by me and considered in my medical decision making (see chart for details).  Depression with suicidal ideation and command hallucinations. Cocaine abuse. Old records are reviewed, and she had been [facility] to [name] for suicidal ideation.Screening labs are obtained, and TTS consultation will be obtained.  TTS consultation is appreciated. Patient has been accepted for inpatient care at [facility]. Accepting physician is Dr. Jama Flavors.  Final Clinical Impressions(s) / ED Diagnoses   Final diagnoses:  Cocaine use disorder,  severe, dependence (HCC)  Suicidal ideation  Auditory hallucination    New Prescriptions New Prescriptions   No medications on file     Dione Booze, MD 08/07/17 2028

## 2017-08-07 NOTE — ED Notes (Signed)
Patient discharged to [facility] hospital. Patient alert and oriented. Report called to [employer]. Patient escorted by [facility] transportation.  All belongings returned to patient and given to [facility] transportation driver along with paperwork. Patient aware of admission and voiced no concerns.

## 2017-08-07 NOTE — ED Notes (Signed)
Bed: [facility]36 Expected date:  Expected time:  Means of arrival:  Comments: Triage 4 

## 2017-08-07 NOTE — ED Triage Notes (Signed)
Pt verbalizes "been wanting take some pills to end it;" "I have been feeling this way for some years." Pt denies HI or A/VH.

## 2017-08-07 NOTE — BH Assessment (Addendum)
Assessment Note  Natalie Fischer is an 36 y.o. female who presents voluntarily  reporting symptoms of depression and suicidal ideation with a plan to OD and "end it all". Pt has a history of depression and cocaine use (using about an 8 ball per week, occasional THC use). She works at [employer] and says, "I am overwhelmed, tired of fighting, tired of life, it feels like there is someone else in my head and I can't control my actions. I can't even see my real self in the mirror sometimes, and I am self medicating with drugs".   Pt reports that she is treated at [facility] with medication--takes a 1x month shot, takes Remeron and Buspiron, but is not compliant with taking both of those because they "don't work". Past attempts include one attempt as a teen. Pt acknowledges symptoms including: lack of sleep (2 hrs), loss of appetite (lost 30 lbs since June). PT denies homicidal ideation/ history of violence. Pt states that she hears voices telling her, "I will show you how to do drugs and still do what you are supposed to do", also saying "other negative things". Pt states current stressors include her mom possibly wanting to kick her out.  Pt denies history of abuse and trauma.  Pt has fair insight and judgment. Pt's memory is typical. Pt denies legal history.  ? MSE: Pt is casually dressed, alert, oriented x4 with normal speech and restless motor behavior. Eye contact is good. Pt's mood is depressed and affect is depressed and blunted. Affect is congruent with mood. Thought process is coherent and relevant. There is no indication Pt is currently responding to internal stimuli or experiencing delusional thought content. Pt was cooperative throughout assessment. Pt is currently unable to contract for safety outside the hospital and wants inpatient psychiatric treatment.  Per Elta Guadeloupe, NP, pt meets IP criteria. TTS will seek placement. Per Miki Kins pt accepted to [facility] pending medical clearance, night AC  to coordinate time.  Diagnosis:MDD, recurrent, severe with psychotic features; substance abuse disorder  Past Medical History:  Past Medical History:  Diagnosis Date  . Anxiety   . Bipolar 1 disorder (HCC)   . Carpal tunnel syndrome of right wrist 2016  . Chlamydia   . Constipation   . Ectopic pregnancy 2008 and 2014   2008 required surgery, 2nd Misoprostol  . GERD (gastroesophageal reflux disease)   . Insomnia   . Trichomonas vaginitis   . UTI (lower urinary tract infection)     Past Surgical History:  Procedure Laterality Date  . ECTOPIC PREGNANCY SURGERY Right 2008    Family History:  Family History  Problem Relation Age of Onset  . Cancer Sister 69  . COPD Sister   . Alcohol abuse Neg Hx   . Arthritis Neg Hx   . Asthma Neg Hx   . Birth defects Neg Hx   . Depression Neg Hx   . Diabetes Neg Hx   . Drug abuse Neg Hx   . Early death Neg Hx   . Hearing loss Neg Hx   . Heart disease Neg Hx   . Hyperlipidemia Neg Hx   . Hypertension Neg Hx   . Kidney disease Neg Hx   . Learning disabilities Neg Hx   . Mental illness Neg Hx   . Mental retardation Neg Hx   . Miscarriages / Stillbirths Neg Hx   . Stroke Neg Hx   . Vision loss Neg Hx   . Varicose Veins Neg Hx  Social History:  reports that she has been smoking Cigarettes.  She started smoking about 18 years ago. She has been smoking about 0.25 packs per day. She has never used smokeless tobacco. She reports that she drinks alcohol. She reports that she does not use drugs.  Additional Social History:  Alcohol / Drug Use Pain Medications: None Prescriptions: none Over the Counter: none History of alcohol / drug use?: Yes Longest period of sobriety (when/how long): Afew weeks Negative Consequences of Use: Personal relationships, Financial Substance #1 Name of Substance 1: cocaine 1 - Age of First Use: unk 1 - Amount (size/oz): 1 8 ball 1 - Frequency: per week 1 - Duration: months 1 - Last Use / Amount:  unk  CIWA: CIWA-Ar BP: 107/79 Pulse Rate: 86 COWS:    Allergies:  Allergies  Allergen Reactions  . Depakene [Divalproex Sodium] Other (See Comments)    "made me feel like I was going to pass out"    Home Medications:  (Not in a hospital admission)  OB/GYN Status:  Patient's last menstrual period was 08/06/2017.  General Assessment Data [facility]: [facility] TTS Assessment: In system Is this a Tele or Face-to-Face Assessment?: Face-to-Face Is this an Initial Assessment or a Re-assessment for this encounter?: Initial Assessment Marital status: Single Is patient pregnant?: Unknown Pregnancy Status: Unknown Living Arrangements: Parent Can pt return to current living arrangement?: Yes Admission Status: Voluntary Is patient capable of signing voluntary admission?: Yes Referral Source: Self/Family/Friend Insurance type: SP     Crisis Care Plan Living Arrangements: Parent Name of Psychiatrist: Transport planner Name of Therapist: [facility]  Education Status Is patient currently in school?: No  Risk to self with the past 6 months Suicidal Ideation: Yes-Currently Present Has patient been a risk to self within the past 6 months prior to admission? : No Suicidal Intent: Yes-Currently Present Has patient had any suicidal intent within the past 6 months prior to admission? : No Is patient at risk for suicide?: Yes Suicidal Plan?: Yes-Currently Present Has patient had any suicidal plan within the past 6 months prior to admission? : Yes Specify Current Suicidal Plan: overdose on pills Access to Means: Yes Specify Access to Suicidal Means: environment What has been your use of drugs/alcohol within the last 12 months?: See Sa section Previous Attempts/Gestures: Yes How many times?: 1 (cut herself as a teen) Other Self Harm Risks: SA Triggers for Past Attempts: Unpredictable Intentional Self Injurious Behavior: None Family Suicide History: Unknown Recent stressful life  event(s): Financial Problems, Conflict (Comment), Turmoil (Comment) Persecutory voices/beliefs?: No Depression: Yes Depression Symptoms: Insomnia, Feeling angry/irritable, Loss of interest in usual pleasures, Fatigue, Guilt, Isolating, Tearfulness Substance abuse history and/or treatment for substance abuse?: Yes Suicide prevention information given to non-admitted patients: Yes  Risk to Others within the past 6 months Homicidal Ideation: No Does patient have any lifetime risk of violence toward others beyond the six months prior to admission? : No Thoughts of Harm to Others: No Current Homicidal Intent: No Current Homicidal Plan: No Access to Homicidal Means: No History of harm to others?: No Assessment of Violence: None Noted Does patient have access to weapons?: No Criminal Charges Pending?: No Does patient have a court date: No Is patient on probation?: No  Psychosis Hallucinations: Auditory Delusions: None noted  Mental Status Report Appearance/Hygiene: In scrubs, Unremarkable Eye Contact: Good Motor Activity: Restlessness Speech: Logical/coherent Level of Consciousness: Alert Mood: Depressed, Anxious, Sad Affect: Depressed, Anxious Anxiety Level: Moderate Thought Processes: Coherent, Relevant Judgement: Partial Orientation:  Person, Place, Time, Situation, Appropriate for developmental age Obsessive Compulsive Thoughts/Behaviors: None  Cognitive Functioning Concentration: Fair Memory: Recent Intact, Remote Intact IQ: Average Insight: Fair Impulse Control: Fair Appetite: Poor Weight Loss: 30 Weight Gain: 0 Sleep: Decreased Total Hours of Sleep: 2 Vegetative Symptoms: Decreased grooming  ADLScreening [facility] Assessment Services) Patient's cognitive ability adequate to safely complete daily activities?: Yes Patient able to express need for assistance with ADLs?: Yes Independently performs ADLs?: Yes (appropriate for developmental age)  Prior Inpatient  Therapy Prior Inpatient Therapy: Yes Prior Therapy Dates: 2017 Prior Therapy Facilty/Provider(s): [facility] Reason for Treatment: depression  Prior Outpatient Therapy Prior Outpatient Therapy: Yes Prior Therapy Dates: ongoing Prior Therapy Facilty/Provider(s): [facility] Reason for Treatment: depression Does patient have an ACCT team?: No Does patient have Intensive In-House Services?  : No Does patient have [facility] services? : Yes Does patient have P4CC services?: No  ADL Screening (condition at time of admission) Patient's cognitive ability adequate to safely complete daily activities?: Yes Is the patient deaf or have difficulty hearing?: No Does the patient have difficulty seeing, even when wearing glasses/contacts?: No Does the patient have difficulty concentrating, remembering, or making decisions?: No Patient able to express need for assistance with ADLs?: Yes Does the patient have difficulty dressing or bathing?: No Independently performs ADLs?: Yes (appropriate for developmental age) Does the patient have difficulty walking or climbing stairs?: No Weakness of Legs: None Weakness of Arms/Hands: None  Home Assistive Devices/Equipment Home Assistive Devices/Equipment: None    Abuse/Neglect Assessment (Assessment to be complete while patient is alone) Physical Abuse: Denies Verbal Abuse: Denies Sexual Abuse: Denies Exploitation of patient/patient's resources: Denies Self-Neglect: Denies Values / Beliefs Cultural Requests During Hospitalization: None Spiritual Requests During Hospitalization: None   Advance Directives (For Healthcare) Does Patient Have a Medical Advance Directive?: No    Additional Information 1:1 In Past 12 Months?: No CIRT Risk: No Elopement Risk: No Does patient have medical clearance?: Yes     Disposition:  Disposition Initial Assessment Completed for this Encounter: Yes Disposition of Patient: Inpatient treatment program Type of inpatient  treatment program: Adult  On Site Evaluation by:   Reviewed with Physician:    Theo Dills 08/07/2017 5:52 PM

## 2017-08-07 NOTE — ED Notes (Signed)
Bed: [facility] Expected date:  Expected time:  Means of arrival:  Comments: 

## 2017-08-08 DIAGNOSIS — R450 Nervousness: Secondary | ICD-10-CM

## 2017-08-08 DIAGNOSIS — R5383 Other fatigue: Secondary | ICD-10-CM

## 2017-08-08 DIAGNOSIS — R4586 Emotional lability: Secondary | ICD-10-CM

## 2017-08-08 DIAGNOSIS — R45851 Suicidal ideations: Secondary | ICD-10-CM

## 2017-08-08 DIAGNOSIS — R454 Irritability and anger: Secondary | ICD-10-CM

## 2017-08-08 DIAGNOSIS — R440 Auditory hallucinations: Secondary | ICD-10-CM

## 2017-08-08 DIAGNOSIS — R110 Nausea: Secondary | ICD-10-CM

## 2017-08-08 DIAGNOSIS — R5381 Other malaise: Secondary | ICD-10-CM

## 2017-08-08 DIAGNOSIS — R4587 Impulsiveness: Secondary | ICD-10-CM

## 2017-08-08 DIAGNOSIS — F1920 Other psychoactive substance dependence, uncomplicated: Secondary | ICD-10-CM

## 2017-08-08 DIAGNOSIS — M791 Myalgia, unspecified site: Secondary | ICD-10-CM

## 2017-08-08 DIAGNOSIS — R51 Headache: Secondary | ICD-10-CM

## 2017-08-08 DIAGNOSIS — F314 Bipolar disorder, current episode depressed, severe, without psychotic features: Secondary | ICD-10-CM

## 2017-08-08 DIAGNOSIS — M2550 Pain in unspecified joint: Secondary | ICD-10-CM

## 2017-08-08 DIAGNOSIS — F419 Anxiety disorder, unspecified: Secondary | ICD-10-CM

## 2017-08-08 DIAGNOSIS — G4700 Insomnia, unspecified: Secondary | ICD-10-CM

## 2017-08-08 DIAGNOSIS — F17210 Nicotine dependence, cigarettes, uncomplicated: Secondary | ICD-10-CM

## 2017-08-08 DIAGNOSIS — R4582 Worries: Secondary | ICD-10-CM

## 2017-08-08 MED ORDER — ENSURE ENLIVE PO LIQD
237.0000 mL | Freq: Three times a day (TID) | ORAL | Status: DC
  Administered 2017-08-08 – 2017-08-10 (×4): 237 mL via ORAL

## 2017-08-08 NOTE — Progress Notes (Signed)
NUTRITION ASSESSMENT  Pt identified as at risk on the Malnutrition Screen Tool  INTERVENTION: 1. Supplements: Ensure Enlive po TID, each supplement provides 350 kcal and 20 grams of protein  NUTRITION DIAGNOSIS: Unintentional weight loss related to sub-optimal intake as evidenced by pt report.   Goal: Pt to meet >/= 90% of their estimated nutrition needs.  Monitor:  PO intake  Assessment:  Pt admitted with polysubstance abuse (cocaine, marijuana). Pt with worsening depression symptoms over the past 3 months. Pt has lost 23 lb since 6/27 (17% wt loss x 3.5 months, significant for time frame). Pt would benefit from nutritional supplements given weight loss.  Height: Ht Readings from Last 1 Encounters:  08/07/17  (1.803 m)    Weight: Wt Readings from Last 1 Encounters:  08/07/17 112 lb (50.8 kg)    Weight Hx: Wt Readings from Last 10 Encounters:  08/07/17 112 lb (50.8 kg)  08/07/17 118 lb (53.5 kg)  07/13/17 117 lb (53.1 kg)  06/28/17 115 lb (52.2 kg)  06/26/17 115 lb (52.2 kg)  06/26/17 115 lb (52.2 kg)  04/19/17 135 lb 9.6 oz (61.5 kg)  08/08/16 123 lb (55.8 kg)  01/22/16 113 lb (51.3 kg)  12/18/15 117 lb 8 oz (53.3 kg)    BMI:  Body mass index is 15.62 kg/m. Pt meets criteria for underweight based on current BMI.  Estimated Nutritional Needs: Kcal: 25-30 kcal/kg Protein: > 1 gram protein/kg Fluid: 1 ml/kcal  Diet Order: Diet regular Room service appropriate? No; Fluid consistency: Thin Pt is also offered choice of unit snacks mid-morning and mid-afternoon.  Pt is eating as desired.   Lab results and medications reviewed.   Tilda Franco, MS, RD, LDN [facility] Inpatient Clinical Dietitian Pager: [phone] After Hours Pager: [phone]
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
## 2017-08-08 NOTE — Progress Notes (Signed)
Recreation Therapy Notes  Animal-Assisted Activity (AAA) Program Checklist/Progress Notes Patient Eligibility Criteria Checklist & Daily Group note for Rec TxIntervention  Date: 10.16.2018 Time: 2:45pm Location: [address] Dayroom   AAA/T Program Assumption of Risk Form signed by Patient/ or Parent Legal Guardian Yes  Patient is free of allergies or sever asthma Yes  Patient reports no fear of animals Yes  Patient reports no history of cruelty to animals Yes  Patient understands his/her participation is voluntary Yes  Behavioral Response: Did not attend.   Natalie Fischer, LRT/CTRS        Natalie Fischer L 08/08/2017 2:58 PM 
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
## 2017-08-08 NOTE — BHH Group Notes (Signed)
[facility] Mental Health Association Group Therapy 08/08/2017 1:15pm  Type of Therapy: Mental Health Association Presentation  Participation Level: Active  Participation Quality: Attentive  Affect: Appropriate  Cognitive: Oriented  Insight: Developing/Improving  Engagement in Therapy: Engaged  Modes of Intervention: Discussion, Education and Socialization  Summary of Progress/Problems: Mental Health Association (MHA) Speaker came to talk about his personal journey with mental health. The pt processed ways by which to relate to the speaker. MHA speaker provided handouts and educational information pertaining to groups and services offered by the [facility]. Pt was engaged in speaker's presentation and was receptive to resources provided.    [employer], LCSW 08/08/2017 3:06 PM

## 2017-08-08 NOTE — Progress Notes (Signed)
D: Patient observed to be resting in bed thus far today.  Resistant to participate in programming. Withdrawn and isolative. Patient verbalizes to this Clinical research associate "today is a tough day. I'm just so depressed." Patient's affect flat, sad and depressed with congruent mood. Per self inventory and discussions with writer, rates depression, hopelessness and anxiety all at a 10/10. Rates sleep as fair, appetite as poor, energy as low and concentration as poor.  States goal for today is "getting out of this depression, think positive thoughts." Denies pain, physical problems though on self inventory indicates she is experiencing withdrawal in the form of chills and runny nose.   A: Medicated per orders, no prns requested or required. Patient had indicated on admit she wanted pneumonia and flu vaccines however refuses today. Level III obs in place for safety. Emotional support offered and self inventory reviewed.  Suicide Safety Plan completed and placed on shadow chart. Strongly encoruaged programming participation. Discussed POC with MD, SW.  R: Patient verbalizes understanding of POC. Patient endorsing passive SI however denies plan, intent. Verbal contract in place with this Clinical research associate.  No HI/AVH and remains safe on level III obs. Will continue to monitor closely and make verbal contact frequently.

## 2017-08-08 NOTE — Plan of Care (Signed)
Problem: Education: Goal: Verbalization of understanding the information provided will improve Outcome: Progressing Patient verbalizes understanding of information, education provided.  Problem: Safety: Goal: Periods of time without injury will increase Outcome: Progressing Patient has not engaged in self harm. Endorsing passive SI however denies plan, intent.

## 2017-08-08 NOTE — H&P (Signed)
Psychiatric Admission Assessment Adult  Patient Identification: Natalie Fischer  MRN:  [id number]  Date of Evaluation:  08/08/2017  Chief Complaint: Suicidal thoughts x 1 month.  Principal Diagnosis: Bipolar 1 disorder, depressed, moderate ([place]), Polysubstance use disorder (excluding opioids), dependence, daily use.  Diagnosis:   Patient Active Problem List   Diagnosis Date Noted  . Polysubstance (excluding opioids) dependence, daily use ([place]) [F19.20] 08/08/2017  . Bipolar 1 disorder, depressed, severe ([place]) [F31.4] 08/07/2017  . Mild benzodiazepine use disorder ([place]) [F13.10] 01/23/2016  . Cocaine abuse ([place]) [F14.10] 01/22/2016  . Bipolar 1 disorder, depressed, moderate ([place]) [F31.32] 02/20/2015  . Cocaine use disorder, severe, dependence ([place]) [F14.20]   . Cannabis use disorder, moderate, dependence ([place]) [F12.20]   . ANEMIA [D64.9] 05/11/2010  . LOSS OF WEIGHT [R63.4] 03/07/2008  . ALLERGIC RHINITIS [J30.9] 01/18/2008  . INSOMNIA [G47.00] 01/18/2008  . ANXIETY [F41.1] 08/31/2007  . GERD [K21.9] 06/18/2007  . CONSTIPATION [K59.00] 06/18/2007   History of Present Illness: This is one of the several admission assessments for this 36 year old AA female with hx of Bipolar disorder & polysubstance use disorder. She is being re-admitted to the [facility] from the [facility] with complaints of suicidal ideations going on x 1 month & auditory hallucinations telling her to go ahead & hurt herself. During this assessment, Kaitlen reports, "The crisis hot-line staff took me to the ED yesterday. I felt like I was going to hurt myself, so, I called them. The suicidal thoughts has been going on x 1 month. It is because of my life. There is nothing good about my life right now. I'm a drug addict. I feel & I'm depressed all the time. I have been using cocaine everyday, as much as I can possibly use x 3 months. I really do need to go to a substance abuse treatment program to get a handle on my  drug use. It is now out of control. I have Bipolar disorder. I take the monthly Abilify shots. I received the shot last week Wednesday. I go to [place] for my mental health care".  Associated Signs/Symptoms:  Depression Symptoms:  insomnia, feelings of worthlessness/guilt, hopelessness, suicidal thoughts without plan, anxiety, weight loss,  (Hypo) Manic Symptoms:  Hallucinations, Impulsivity, Irritable Mood, Labiality of Mood,  Anxiety Symptoms:  Excessive Worry,  Psychotic Symptoms:  Hallucinations: Auditory Command:  "The voices are telling me to go ahead, hyrt myself"  PTSD Symptoms: Denies any PTSD symptoms  Total Time spent with patient: 1 hour  Past Psychiatric History: Bipolar affective disorder  Is the patient at risk to self? Yes.    Has the patient been a risk to self in the past 6 months? No.  Has the patient been a risk to self within the distant past? No.  Is the patient a risk to others? No.  Has the patient been a risk to others in the past 6 months? No.  Has the patient been a risk to others within the distant past? No.   Prior Inpatient Therapy: Yes [facility]) Prior Outpatient Therapy: [facility]  Alcohol Screening: 1. How often do you have a drink containing alcohol?: 2 to 4 times a month 2. How many drinks containing alcohol do you have on a typical day when you are drinking?: 3 or 4 3. How often do you have six or more drinks on one occasion?: Never Preliminary Score: 1 9. Have you or someone else been injured as a result of your drinking?: No 10. Has a  relative or friend or a doctor or another health worker been concerned about your drinking or suggested you cut down?: No Alcohol Use Disorder Identification Test Final Score (AUDIT): 3 Brief Intervention: AUDIT score less than 7 or less-screening does not suggest unhealthy drinking-brief intervention not indicated  Substance Abuse History in the last 12 months:  Yes.    Consequences of Substance  Abuse: Medical Consequences:  Liver damage, Possible death by overdose Legal Consequences:  Arrests, jail time, Loss of driving privilege. Family Consequences:  Family discord, divorce and or separation.  Previous Psychotropic Medications: Yes (Depakote, Latuda, Vistaril, Remeron  Psychological Evaluations: Yes   Past Medical History:  Past Medical History:  Diagnosis Date  . Anxiety   . Bipolar 1 disorder ([place])   . Carpal tunnel syndrome of right wrist 2016  . Chlamydia   . Constipation   . Ectopic pregnancy 2008 and 2014   2008 required surgery, 2nd Misoprostol  . GERD (gastroesophageal reflux disease)   . Insomnia   . Trichomonas vaginitis   . UTI (lower urinary tract infection)     Past Surgical History:  Procedure Laterality Date  . ECTOPIC PREGNANCY SURGERY Right 2008   Family History:  Family History  Problem Relation Age of Onset  . Cancer Sister 77  . COPD Sister   . Alcohol abuse Neg Hx   . Arthritis Neg Hx   . Asthma Neg Hx   . Birth defects Neg Hx   . Depression Neg Hx   . Diabetes Neg Hx   . Drug abuse Neg Hx   . Early death Neg Hx   . Hearing loss Neg Hx   . Heart disease Neg Hx   . Hyperlipidemia Neg Hx   . Hypertension Neg Hx   . Kidney disease Neg Hx   . Learning disabilities Neg Hx   . Mental illness Neg Hx   . Mental retardation Neg Hx   . Miscarriages / Stillbirths Neg Hx   . Stroke Neg Hx   . Vision loss Neg Hx   . Varicose Veins Neg Hx    Family Psychiatric  History: Bipolar disorder: Paternal aunt  Tobacco Screening: Smokes up to a pack of cigarettes daily  Social History:  History  Alcohol Use  . 0.0 oz/week    Comment: occassional     History  Drug Use No    Comment: Xanax    Additional Social History:  Allergies:   Allergies  Allergen Reactions  . Depakene [Divalproex Sodium] Other (See Comments)    "made me feel like I was going to pass out"    Lab Results:  Results for orders placed or performed during the  hospital encounter of 08/07/17 (from the past 48 hour(s))  Rapid urine drug screen (hospital performed)     Status: Abnormal   Collection Time: 08/07/17  3:47 PM  Result Value Ref Range   Opiates NONE DETECTED NONE DETECTED   Cocaine POSITIVE (A) NONE DETECTED   Benzodiazepines NONE DETECTED NONE DETECTED   Amphetamines POSITIVE (A) NONE DETECTED   Tetrahydrocannabinol POSITIVE (A) NONE DETECTED   Barbiturates NONE DETECTED NONE DETECTED    Comment:        DRUG SCREEN FOR MEDICAL PURPOSES ONLY.  IF CONFIRMATION IS NEEDED FOR ANY PURPOSE, NOTIFY LAB WITHIN 5 DAYS.        LOWEST DETECTABLE LIMITS FOR URINE DRUG SCREEN Drug Class       Cutoff (ng/mL) Amphetamine      1000 Barbiturate  200 Benzodiazepine   213 Tricyclics       086 Opiates          300 Cocaine          300 THC              50   Comprehensive metabolic panel     Status: Abnormal   Collection Time: 08/07/17  4:09 PM  Result Value Ref Range   Sodium 137 135 - 145 mmol/L   Potassium 3.5 3.5 - 5.1 mmol/L   Chloride 103 101 - 111 mmol/L   CO2 28 22 - 32 mmol/L   Glucose, Bld 86 65 - 99 mg/dL   BUN 12 6 - 20 mg/dL   Creatinine, Ser 0.93 0.44 - 1.00 mg/dL   Calcium 8.7 (L) 8.9 - 10.3 mg/dL   Total Protein 6.9 6.5 - 8.1 g/dL   Albumin 3.7 3.5 - 5.0 g/dL   AST 18 15 - 41 U/L   ALT 10 (L) 14 - 54 U/L   Alkaline Phosphatase 44 38 - 126 U/L   Total Bilirubin 0.6 0.3 - 1.2 mg/dL   GFR calc non Af Amer >60 >60 mL/min   GFR calc Af Amer >60 >60 mL/min    Comment: (NOTE) The eGFR has been calculated using the CKD EPI equation. This calculation has not been validated in all clinical situations. eGFR's persistently <60 mL/min signify possible Chronic Kidney Disease.    Anion gap 6 5 - 15  cbc     Status: None   Collection Time: 08/07/17  4:09 PM  Result Value Ref Range   WBC 5.4 4.0 - 10.5 K/uL   RBC 4.21 3.87 - 5.11 MIL/uL   Hemoglobin 12.4 12.0 - 15.0 g/dL   HCT 37.0 36.0 - 46.0 %   MCV 87.9 78.0 - 100.0 fL    MCH 29.5 26.0 - 34.0 pg   MCHC 33.5 30.0 - 36.0 g/dL   RDW 14.1 11.5 - 15.5 %   Platelets 202 150 - 400 K/uL  Ethanol     Status: None   Collection Time: 08/07/17  4:10 PM  Result Value Ref Range   Alcohol, Ethyl (B) <10 <10 mg/dL    Comment:        LOWEST DETECTABLE LIMIT FOR SERUM ALCOHOL IS 10 mg/dL FOR MEDICAL PURPOSES ONLY   Salicylate level     Status: None   Collection Time: 08/07/17  4:10 PM  Result Value Ref Range   Salicylate Lvl <5.7 2.8 - 30.0 mg/dL  Acetaminophen level     Status: Abnormal   Collection Time: 08/07/17  4:10 PM  Result Value Ref Range   Acetaminophen (Tylenol), Serum <10 (L) 10 - 30 ug/mL    Comment:        THERAPEUTIC CONCENTRATIONS VARY SIGNIFICANTLY. A RANGE OF 10-30 ug/mL MAY BE AN EFFECTIVE CONCENTRATION FOR MANY PATIENTS. HOWEVER, SOME ARE BEST TREATED AT CONCENTRATIONS OUTSIDE THIS RANGE. ACETAMINOPHEN CONCENTRATIONS >150 ug/mL AT 4 HOURS AFTER INGESTION AND >50 ug/mL AT 12 HOURS AFTER INGESTION ARE OFTEN ASSOCIATED WITH TOXIC REACTIONS.   I-Stat beta hCG blood, ED     Status: None   Collection Time: 08/07/17  4:20 PM  Result Value Ref Range   I-stat hCG, quantitative <5.0 <5 mIU/mL   Comment 3            Comment:   GEST. AGE      CONC.  (mIU/mL)   <=1 WEEK  5 - 50     2 WEEKS       50 - 500     3 WEEKS       100 - 10,000     4 WEEKS     1,000 - 30,000        FEMALE AND NON-PREGNANT FEMALE:     LESS THAN 5 mIU/mL    Blood Alcohol level:  Lab Results  Component Value Date   ETH <10 08/07/2017   ETH <5 96/78/9381   Metabolic Disorder Labs:  Lab Results  Component Value Date   HGBA1C 6.0 (H) 02/21/2015   MPG 126 02/21/2015   No results found for: PROLACTIN Lab Results  Component Value Date   CHOL 138 02/21/2015   TRIG 60 02/21/2015   HDL 51 02/21/2015   CHOLHDL 2.7 02/21/2015   VLDL 12 02/21/2015   LDLCALC 75 02/21/2015   LDLCALC 86 04/03/2009   Current Medications: Current Facility-Administered  Medications  Medication Dose Route Frequency Provider Last Rate Last Dose  . acetaminophen (TYLENOL) tablet 650 mg  650 mg Oral Q6H PRN Ethelene Hal, NP      . alum & mag hydroxide-simeth (MAALOX/MYLANTA) 200-200-20 MG/5ML suspension 30 mL  30 mL Oral Q4H PRN Ethelene Hal, NP      . ARIPiprazole (ABILIFY) tablet 15 mg  15 mg Oral QHS Ethelene Hal, NP   15 mg at 08/07/17 2308  . busPIRone (BUSPAR) tablet 5 mg  5 mg Oral TID Ethelene Hal, NP      . hydrOXYzine (ATARAX/VISTARIL) tablet 25 mg  25 mg Oral Q6H PRN Ethelene Hal, NP      . Influenza vac split quadrivalent PF (FLUARIX) injection 0.5 mL  0.5 mL Intramuscular Tomorrow-1000 Cobos, Fernando A, MD      . magnesium hydroxide (MILK OF MAGNESIA) suspension 30 mL  30 mL Oral Daily PRN Ethelene Hal, NP      . mirtazapine (REMERON) tablet 30 mg  30 mg Oral QHS Ethelene Hal, NP   30 mg at 08/07/17 2308  . nicotine (NICODERM CQ - dosed in mg/24 hr) patch 7 mg  7 mg Transdermal Daily Ethelene Hal, NP      . pantoprazole (PROTONIX) EC tablet 20 mg  20 mg Oral Daily Ethelene Hal, NP      . pneumococcal 23 valent vaccine (PNU-IMMUNE) injection 0.5 mL  0.5 mL Intramuscular Tomorrow-1000 Cobos, Myer Peer, MD      . traZODone (DESYREL) tablet 50 mg  50 mg Oral QHS PRN Ethelene Hal, NP       PTA Medications: Prescriptions Prior to Admission  Medication Sig Dispense Refill Last Dose  . ARIPiprazole ER (ABILIFY MAINTENA) 400 MG SRER Inject 400 mg into the muscle every 28 (twenty-eight) days.   08/02/2017  . busPIRone (BUSPAR) 5 MG tablet Take 5 mg by mouth 3 (three) times daily.   Past Week at Unknown time  . feeding supplement, ENSURE ENLIVE, (ENSURE ENLIVE) LIQD For nutritional supplement when unable to eat max of three per day 237 mL 12 Past Week at Unknown time  . hydrOXYzine (ATARAX/VISTARIL) 25 MG tablet Take 1 tablet (25 mg total) by mouth every 6 (six) hours as  needed for anxiety (sleep). 60 tablet 0 Past Week at Unknown time  . mirtazapine (REMERON) 30 MG tablet Take 1 tablet (30 mg total) by mouth at bedtime. For depression 30 tablet 0 Past Week at Unknown time  .  pantoprazole (PROTONIX) 20 MG tablet Take 1 tablet (20 mg total) by mouth daily. For acid reflux 30 tablet 0 Past Week at Unknown time   Musculoskeletal: Strength & Muscle Tone: within normal limits Gait & Station: normal Patient leans: N/A  Psychiatric Specialty Exam: Physical Exam  Constitutional: She is oriented to person, place, and time. She appears well-developed and well-nourished.  HENT:  Head: Normocephalic.  Eyes: Pupils are equal, round, and reactive to light.  Neck: Normal range of motion.  Cardiovascular: Normal rate.   Respiratory: Effort normal.  GI: Soft.  Genitourinary:  Genitourinary Comments: Deferred  Musculoskeletal: Normal range of motion.  Neurological: She is alert and oriented to person, place, and time.  Skin: Skin is warm and dry.  Psychiatric: Her speech is normal and behavior is normal. Thought content normal. Her mood appears anxious. Her affect is not angry, not blunt, not labile and not inappropriate. Cognition and memory are normal. She expresses impulsivity. She exhibits a depressed mood.    Review of Systems  Constitutional: Positive for chills, diaphoresis and malaise/fatigue.  Eyes: Negative.   Respiratory: Negative.   Cardiovascular: Negative.   Gastrointestinal: Positive for nausea.  Genitourinary: Negative.   Musculoskeletal: Positive for joint pain and myalgias.  Skin: Negative.   Neurological: Positive for weakness and headaches.  Endo/Heme/Allergies: Negative.   Psychiatric/Behavioral: Positive for depression and substance abuse (Polysubstance dependence). Negative for hallucinations and memory loss. The patient is nervous/anxious and has insomnia.     Blood pressure 96/77, pulse 81, temperature 98.5 F (36.9 C), temperature  source Oral, resp. rate 12, height 5' 11"  (1.803 m), weight 50.8 kg (112 lb), last menstrual period 08/06/2017.Body mass index is 15.62 kg/m.  General Appearance: Casual, thin frame  Eye Contact::  Fair  Speech:  Clear, coherent, not spontaneous  Volume: Decreased  Mood: Anxious and Depressed  Affect:  Congruent and Flat  Thought Process:  Coherent and Descriptions of Associations: Intact  Orientation:  Full (Time, Place, and Person)  Thought Content:  Rumination, admits auditory hallucinations.  Suicidal Thoughts:  Yes.  without intent/plan  Homicidal Thoughts:  Denies  Memory:  Immediate;   Good Recent;   Good Remote;   Good  Judgement:  Fair  Insight:  Fair  Psychomotor Activity:  Normal  Concentration:  Fair  Recall:  Good  Fund of Knowledge:Fair  Language: Good  Akathisia:  Negative  Handed:  Right  AIMS (if indicated):     Assets:  Communication Skills Desire for Improvement  ADL's:  Intact  Cognition: WNL  Sleep:  Number of Hours: 6.25   Treatment Plan/Recommendations: 1. Admit for crisis management and stabilization, estimated length of stay 3-5 days.  2. Medication management to reduce current symptoms to base line and improve the patient's overall level of functioning; See MAR, Md's SRA & treatment plan. 3. Treat health problems as indicated.  4. Develop treatment plan to decrease risk of relapse upon discharge and the need for readmission.  5. Psycho-social education regarding relapse prevention and self care.  6. Health care follow up as needed for medical problems.  7. Review, reconcile, and reinstate any pertinent home medications for other health issues where appropriate. 8. Call for consults with hospitalist for any additional specialty patient care services as needed.  Observation Level/Precautions:  15 minute checks  Laboratory:  Per ED, UDS (+) for Amphetamine, Cocaine & THC  Psychotherapy: Group sessions   Medications: See MAR.  Consultations: As  needed    Discharge Concerns: Safety, mood stability  Estimated LOS: 2-4 days  Other: Admit to the [facility]   Physician Treatment Plan for Primary Diagnosis: Will initiate medication management for mood stability. Set up an outpatient psychiatric services for medication management. Will encourage medication adherence with psychiatric medications.  Long Term Goal(s): Improvement in symptoms so as ready for discharge  Short Term Goals: Ability to identify changes in lifestyle to reduce recurrence of condition will improve and Ability to verbalize feelings will improve  Physician Treatment Plan for Secondary Diagnosis: Active Problems: Bipolar 1 disorder, depressed, Polysubstance use disorder, dependence.   Long Term Goal(s): Improvement in symptoms so as ready for discharge  Short Term Goals: Ability to identify and develop effective coping behaviors will improve, Compliance with prescribed medications will improve and Ability to identify triggers associated with substance abuse/mental health issues will improve  I certify that inpatient services furnished can reasonably be expected to improve the patient's condition.    Encarnacion Slates, NP, PMHNP, FNP-BC 10/16/201810:32 AM  I have reviewed the NP note above and am in agreement with the assessment and plan.  Pt is a 36 y/o AA female who presented to [facility] voluntarily with worsening depression, SI with plan to cut herself, and worsening use of cocaine. Pt reports using between "half a gram and a gram" per day of cocaine via insufflation. She notes feeling increasingly dependent on cocaine use to improve her mood as she feels "flat" without it. She describes depressive symptoms of poor mood, low motivation, poor sleep, guilty feelings, low energy, poor concentration, poor appetite, and suicidal ideations with plan to cut on her forearms. However, she cites that she wants to perform better at her job at [employer] and wants to maintain her  employment. Pt denies HI/AH/VH, but she describes struggling with an internal thought which she describes as "a different version of myself" that attempts to motivate her to use drugs. Pt goes on to explain that sometimes she looks in the mirror and it appears that she is someone else, but she notes that she still has control of her behaviors at all times. Pt states she felt overwhelmed with guilt related to her substance use, so she contacted the crisis line. Pt reports she has been compliant with outpatient regimen of Abilify Maintena, remeron, vistaril, and buspar. She agrees to be restarted on home medication regimen with the addition of oral abilify 28m qDay to supplement. [facility] She expresses interest in pursuing treatment for cocaine use. She had no further questions, comments, or concerns.  PLAN OF CARE:  Pt is a 36y/o F with hx of bipolar disorder and stimulant use disorder who presents with worsening depression and SI with plan to cut herself as well as worsening use of cocaine that she does not feel able to stop on her own. Pt describes using 0.5-1 gram per day of cocaine via insufflation, and she describes significant depression in the absence of use of cocaine. At time of evaluation she reports improvement of SI and she is able to contract for safety in the hospital, but she does not feel safe outside the hospital. She is willing to seek treatment for stimulant use but she would like to maintain her current employment as she feels it is a protective factor. She has been compliant with outpatient medication regimen which will be restarted with addition of oral abilify [id number] to supplement Abilify Maintena (last injection on 08/02/17). Pt requires inpatient hospitalization and stabilization.

## 2017-08-09 DIAGNOSIS — F39 Unspecified mood [affective] disorder: Secondary | ICD-10-CM

## 2017-08-09 DIAGNOSIS — R451 Restlessness and agitation: Secondary | ICD-10-CM

## 2017-08-09 DIAGNOSIS — R443 Hallucinations, unspecified: Secondary | ICD-10-CM

## 2017-08-09 NOTE — Tx Team (Signed)
Interdisciplinary Treatment and Diagnostic Plan Update  08/09/2017 Time of Session: 0830AM Natalie Fischer MRN: [id number]  Principal Diagnosis: Polysubstance (excluding opioids) dependence, daily use (HCC)  Secondary Diagnoses: Principal Problem:   Polysubstance (excluding opioids) dependence, daily use (HCC) Active Problems:   Bipolar 1 disorder, depressed, severe (HCC)   Current Medications:  Current Facility-Administered Medications  Medication Dose Route Frequency Provider Last Rate Last Dose  . acetaminophen (TYLENOL) tablet 650 mg  650 mg Oral Q6H PRN Laveda AbbeParks, Laurie Britton, NP      . alum & mag hydroxide-simeth (MAALOX/MYLANTA) 200-200-20 MG/5ML suspension 30 mL  30 mL Oral Q4H PRN Laveda AbbeParks, Laurie Britton, NP      . ARIPiprazole (ABILIFY) tablet 15 mg  15 mg Oral QHS Laveda AbbeParks, Laurie Britton, NP   15 mg at 08/08/17 2129  . busPIRone (BUSPAR) tablet 5 mg  5 mg Oral TID Laveda AbbeParks, Laurie Britton, NP   5 mg at 08/09/17 0900  . feeding supplement (ENSURE ENLIVE) (ENSURE ENLIVE) liquid 237 mL  237 mL Oral TID BM Cobos, Rockey SituFernando A, MD   237 mL at 08/09/17 0906  . hydrOXYzine (ATARAX/VISTARIL) tablet 25 mg  25 mg Oral Q6H PRN Laveda AbbeParks, Laurie Britton, NP   25 mg at 08/08/17 1706  . Influenza vac split quadrivalent PF (FLUARIX) injection 0.5 mL  0.5 mL Intramuscular Tomorrow-1000 Cobos, Fernando A, MD      . magnesium hydroxide (MILK OF MAGNESIA) suspension 30 mL  30 mL Oral Daily PRN Laveda AbbeParks, Laurie Britton, NP      . mirtazapine (REMERON) tablet 30 mg  30 mg Oral QHS Laveda AbbeParks, Laurie Britton, NP   30 mg at 08/08/17 2129  . nicotine (NICODERM CQ - dosed in mg/24 hr) patch 7 mg  7 mg Transdermal Daily Laveda AbbeParks, Laurie Britton, NP   7 mg at 08/09/17 0900  . pantoprazole (PROTONIX) EC tablet 20 mg  20 mg Oral Daily Laveda AbbeParks, Laurie Britton, NP   20 mg at 08/09/17 0900  . pneumococcal 23 valent vaccine (PNU-IMMUNE) injection 0.5 mL  0.5 mL Intramuscular Tomorrow-1000 Cobos, Fernando A, MD      . traZODone  (DESYREL) tablet 50 mg  50 mg Oral QHS PRN Laveda AbbeParks, Laurie Britton, NP       PTA Medications: Prescriptions Prior to Admission  Medication Sig Dispense Refill Last Dose  . ARIPiprazole ER (ABILIFY MAINTENA) 400 MG SRER Inject 400 mg into the muscle every 28 (twenty-eight) days.   08/02/2017  . busPIRone (BUSPAR) 5 MG tablet Take 5 mg by mouth 3 (three) times daily.   Past Week at Unknown time  . feeding supplement, ENSURE ENLIVE, (ENSURE ENLIVE) LIQD For nutritional supplement when unable to eat max of three per day 237 mL 12 Past Week at Unknown time  . hydrOXYzine (ATARAX/VISTARIL) 25 MG tablet Take 1 tablet (25 mg total) by mouth every 6 (six) hours as needed for anxiety (sleep). 60 tablet 0 Past Week at Unknown time  . mirtazapine (REMERON) 30 MG tablet Take 1 tablet (30 mg total) by mouth at bedtime. For depression 30 tablet 0 Past Week at Unknown time  . pantoprazole (PROTONIX) 20 MG tablet Take 1 tablet (20 mg total) by mouth daily. For acid reflux 30 tablet 0 Past Week at Unknown time    Patient Stressors: Financial difficulties Marital or family conflict Substance abuse  Patient Strengths: Capable of independent living Barrister's clerkCommunication skills Motivation for treatment/growth Physical Health  Treatment Modalities: Medication Management, Group therapy, Case management,  1 to 1 session with  clinician, Psychoeducation, Recreational therapy.   Physician Treatment Plan for Primary Diagnosis: Polysubstance (excluding opioids) dependence, daily use (HCC)  Medication Management: Evaluate patient's response, side effects, and tolerance of medication regimen.  Therapeutic Interventions: 1 to 1 sessions, Unit Group sessions and Medication administration.  Evaluation of Outcomes: Progressing  Physician Treatment Plan for Secondary Diagnosis: Principal Problem:   Polysubstance (excluding opioids) dependence, daily use (HCC) Active Problems:   Bipolar 1 disorder, depressed, severe  (HCC)  Long Term Goal(s):     Short Term Goals:       Medication Management: Evaluate patient's response, side effects, and tolerance of medication regimen.  Therapeutic Interventions: 1 to 1 sessions, Unit Group sessions and Medication administration.  Evaluation of Outcomes: Progressing   RN Treatment Plan for Primary Diagnosis: Polysubstance (excluding opioids) dependence, daily use (HCC) Long Term Goal(s): Knowledge of disease and therapeutic regimen to maintain health will improve  Short Term Goals: Ability to remain free from injury will improve, Ability to verbalize feelings will improve and Ability to disclose and discuss suicidal ideas  Medication Management: RN will administer medications as ordered by provider, will assess and evaluate patient's response and provide education to patient for prescribed medication. RN will report any adverse and/or side effects to prescribing provider.  Therapeutic Interventions: 1 on 1 counseling sessions, Psychoeducation, Medication administration, Evaluate responses to treatment, Monitor vital signs and CBGs as ordered, Perform/monitor CIWA, COWS, AIMS and Fall Risk screenings as ordered, Perform wound care treatments as ordered.  Evaluation of Outcomes: Progressing   LCSW Treatment Plan for Primary Diagnosis: Polysubstance (excluding opioids) dependence, daily use (HCC) Long Term Goal(s): Safe transition to appropriate next level of care at discharge, Engage patient in therapeutic group addressing interpersonal concerns.  Short Term Goals: Engage patient in aftercare planning with referrals and resources, Facilitate patient progression through stages of change regarding substance use diagnoses and concerns and Identify triggers associated with mental health/substance abuse issues  Therapeutic Interventions: Assess for all discharge needs, 1 to 1 time with Social worker, Explore available resources and support systems, Assess for adequacy in  community support network, Educate family and significant other(s) on suicide prevention, Complete Psychosocial Assessment, Interpersonal group therapy.  Evaluation of Outcomes: Progressing   Progress in Treatment: Attending groups: Yes. Participating in groups: Yes. Taking medication as prescribed: Yes. Toleration medication: Yes. Family/Significant other contact made: No, will contact:  pt's mother for collateral information and to complete SPE. Patient understands diagnosis: Yes. Discussing patient identified problems/goals with staff: Yes. Medical problems stabilized or resolved: Yes. Denies suicidal/homicidal ideation: Yes. Issues/concerns per patient self-inventory: No. Other: n/a   New problem(s) identified: No, Describe:  n/a  New Short Term/Long Term Goal(s): medication management for detox/mood stabilization; elimination of SI thoughts; development of comprehensive mental wellness/sobriety plan.   Discharge Plan or Barriers: Pt plans to return home and wants to go back to work. She plans to follow-up at [facility] for outpatient services and is a current client. Pt given [facility] information if she changes her mind about residential treatment after discharge. Pt also provided with [facility]AG pamphlet and AA/NA lists for [facility].   Reason for Continuation of Hospitalization: Anxiety Depression Medication stabilization Withdrawal symptoms  Estimated Length of Stay: Thursday, 08/09/17  Attendees: Patient: 08/09/2017 11:04 AM  Physician: Dr. Jama Flavors MD; Dr. Altamese [place] MD 08/09/2017 11:04 AM  Nursing: Luanna Cole Rn 08/09/2017 11:04 AM  RN Care Manager: Onnie Boer CM 08/09/2017 11:04 AM  Social Worker: Chartered loss adjuster, LCSW 08/09/2017 11:04 AM  Recreational Therapist: x  08/09/2017 11:04 AM  Other: Armandina Stammer NP; Feliz Beam Money NP 08/09/2017 11:04 AM  Other:  08/09/2017 11:04 AM  Other: 08/09/2017 11:04 AM    Scribe for Treatment Team: Ledell Peoples Smart,  LCSW 08/09/2017 11:04 AM

## 2017-08-09 NOTE — Progress Notes (Signed)
Pt reports she is doing much better this evening.  She denies SI/HI/AVH.  She has been up and in the milieu.  She took a shower which helped her feel better.  She says her withdrawal symptoms are minimal.  Her main complaint is sweating.  Writer reviewed medications with pt, who voiced understanding.  Pt is pleasant and cooperative.  Support and encouragement offered.  Discharge plans are in process.  Safety maintained with q15 minute checks.

## 2017-08-09 NOTE — [facility] Group Notes (Signed)
LCSW Group Therapy Note   08/09/2017 1:15pm   Type of Therapy and Topic:  Group Therapy:  Overcoming Obstacles   Participation Level:  Active   Description of Group:    In this group patients will be encouraged to explore what they see as obstacles to their own wellness and recovery. They will be guided to discuss their thoughts, feelings, and behaviors related to these obstacles. The group will process together ways to cope with barriers, with attention given to specific choices patients can make. Each patient will be challenged to identify changes they are motivated to make in order to overcome their obstacles. This group will be process-oriented, with patients participating in exploration of their own experiences as well as giving and receiving support and challenge from other group members.   Therapeutic Goals: 1. Patient will identify personal and current obstacles as they relate to admission. 2. Patient will identify barriers that currently interfere with their wellness or overcoming obstacles.  3. Patient will identify feelings, thought process and behaviors related to these barriers. 4. Patient will identify two changes they are willing to make to overcome these obstacles:      Summary of Patient Progress   Natalie Fischer was attentive and engaged during today's processing group. She shared that her biggest obstacle is "using drugs and being in certain environments that I know are bad for me." Natalie Fischer shared that she plans to go back to work at discharge but is hoping to begin support groups such as AA and MHAG. She continues to make progress in the group setting with limited insight.    Therapeutic Modalities:   Cognitive Behavioral Therapy Solution Focused Therapy Motivational Interviewing Relapse Prevention Therapy  Ledell PeoplesHeather N Smart, LCSW 08/09/2017 3:01 PM

## 2017-08-09 NOTE — Progress Notes (Signed)
Patient currently denies SI, HI and AVH.  Patient reported an increase in mood and a decrease in depressive and withdrawal symptoms.    Assess patient for safety, offer medications as prescribed, engage patient in 1:1 staff talks.   Patient able to contract for safety.

## 2017-08-09 NOTE — Progress Notes (Signed)
Recreation Therapy Notes  Date:  08/09/17  Time: 0930 Location: 300 Hall Dayroom  Group Topic: Stress Management  Goal Area(s) Addresses:  Patient will verbalize importance of using healthy stress management.  Patient will identify positive emotions associated with healthy stress management.   Intervention: Stress Management  Activity :  Meditation.  LRT introduced the stress management technique of meditation.  LRT played a meditation from the Calm app to allow patients to focus on the forgiveness of others.  Patients followed along as LRT played meditation.  Education:  Stress Management, Discharge Planning.   Education Outcome: Acknowledges edcuation/In group clarification offered/Needs additional education  Clinical Observations/Feedback: Pt did not attend group.    Kamali Sakata, LRT/CTRS         Nicoli Nardozzi A 08/09/2017 12:20 PM 

## 2017-08-09 NOTE — Progress Notes (Signed)
Patient ID: Natalie Fischer, female   DOB: August 30, 1981, 36 y.o.   MRN: [id number]  Pt currently presents with an animated affect and cooperative behavior. Pt reports to Clinical research associatewriter that their goal is to "go home tomorrow." Pt states "I am so excited to go home to see my mom, make food in my own kitchen and sleep in my own bed." Pt reports good sleep with current medication regimen. Denies any craving currently.   Pt provided with medications per providers orders. Pt's labs and vitals were monitored throughout the night. Pt given a 1:1 about emotional and mental status. Pt supported and encouraged to express concerns and questions.   Pt's safety ensured with 15 minute and environmental checks. Pt currently denies SI/HI and A/V hallucinations. Pt verbally agrees to seek staff if SI/HI or A/VH occurs and to consult with staff before acting on any harmful thoughts. Will continue POC.

## 2017-08-09 NOTE — Progress Notes (Signed)
[facility] MD Progress Note  08/09/2017 5:06 PM Natalie Fischer  MRN:  [id number]  Subjective: Natalie Fischer reports, "I feel great today. Just a little agitation because I'm coming off of all these drugs. Can I have Neurontin please? It helped in the past. I think I'm ready to go home. I have got to go back to work soon".  Objective: (Per SRA): Pt is a 36 y/o AA female who presented to [facility] voluntarily with worsening depression, SI with plan to cut herself, and worsening use of cocaine. Pt reports using between "half a gram and a gram" per day of cocaine via insufflation. She notes feeling increasingly dependent on cocaine use to improve her mood as she feels "flat" without it. She describes depressive symptoms of poor mood, low motivation, poor sleep, guilty feelings, low energy, poor concentration. Natalie Fischer is seen, chart reviewed. This case is discussed with the team. She is alert, visible on the unit, attending group sessions. She says she is doing great. She wants to be discharged in the morning. Other than feeling some agitation from drug withdrawal symptoms, she denies any new issues. She does not appear to be responding to any internal stimuli.  Principal Problem: Polysubstance (excluding opioids) dependence, daily use (HCC)  Diagnosis:   Patient Active Problem List   Diagnosis Date Noted  . Polysubstance (excluding opioids) dependence, daily use (HCC) [F19.20] 08/08/2017  . Bipolar 1 disorder, depressed, severe (HCC) [F31.4] 08/07/2017  . 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 Cocaine abuse (HCC) [F14.10] 01/22/2016  . Bipolar 1 disorder, depressed, moderate (HCC) [F31.32] 02/20/2015  . Cocaine use disorder, severe, dependence (HCC) [F14.20]   . Cannabis use disorder, moderate, dependence (HCC) [F12.20]   . ANEMIA [D64.9] 05/11/2010  . LOSS OF WEIGHT [R63.4] 03/07/2008  . ALLERGIC RHINITIS [J30.9] 01/18/2008  . INSOMNIA [G47.00] 01/18/2008  . ANXIETY [F41.1] 08/31/2007   . GERD [K21.9] 06/18/2007  . CONSTIPATION [K59.00] 06/18/2007   Total Time spent with patient: 25 minutes  Past Psychiatric History: Polysubstance use disorder.  Past Medical History:  Past Medical History:  Diagnosis Date  . Anxiety   . Bipolar 1 disorder (HCC)   . Carpal tunnel syndrome of right wrist 2016  . Chlamydia   . Constipation   . Ectopic pregnancy 2008 and 2014   2008 required surgery, 2nd Misoprostol  . GERD (gastroesophageal reflux disease)   . Insomnia   . Trichomonas vaginitis   . UTI (lower urinary tract infection)     Past Surgical History:  Procedure Laterality Date  . ECTOPIC PREGNANCY SURGERY Right 2008   Family History:  Family History  Problem Relation Age of Onset  . Cancer Sister 12  . COPD Sister   . Alcohol abuse Neg Hx   . Arthritis Neg Hx   . Asthma Neg Hx   . Birth defects Neg Hx   . Depression Neg Hx   . Diabetes Neg Hx   . Drug abuse Neg Hx   . Early death Neg Hx   . Hearing loss Neg Hx   . Heart disease Neg Hx   . Hyperlipidemia Neg Hx   . Hypertension Neg Hx   . Kidney disease Neg Hx   . Learning disabilities Neg Hx   . Mental illness Neg Hx   . Mental retardation Neg Hx   . Miscarriages / Stillbirths Neg Hx   . Stroke Neg Hx   . Vision loss Neg Hx   . Varicose Veins Neg Hx  Family Psychiatric  History: See H&P.  Social History:  History  Alcohol Use  . 0.0 oz/week    Comment: occassional     History  Drug Use No    Comment: Xanax    Social History   Social History  . Marital status: Single    Spouse name: N/A  . Number of children: 0  . Years of education: college-1   Occupational History  . unemployed     Previously worked [employer], Clinical biochemist.   Social History Main Topics  . Smoking status: Current Some Day Smoker    Packs/day: 0.25    Types: Cigarettes    Start date: 05/15/1999    Last attempt to quit: 04/12/2014  . Smokeless tobacco: Never Used  . Alcohol use 0.0 oz/week     Comment:  occassional  . Drug use: No     Comment: Xanax  . Sexual activity: Yes   Other Topics Concern  . None   Social History Narrative   Born in [place], [place] in 1998 when her mother got a job in one of the [place].   Now living with her father.  Sometimes with her sister.   Parents separated.      Additional Social History:   Sleep: Good  Appetite:  Good  Current Medications: Current Facility-Administered Medications  Medication Dose Route Frequency Provider Last Rate Last Dose  . acetaminophen (TYLENOL) tablet 650 mg  650 mg Oral Q6H PRN Laveda Abbe, NP      . alum & mag hydroxide-simeth (MAALOX/MYLANTA) 200-200-20 MG/5ML suspension 30 mL  30 mL Oral Q4H PRN Laveda Abbe, NP      . ARIPiprazole (ABILIFY) tablet 15 mg  15 mg Oral QHS Laveda Abbe, NP   15 mg at 08/08/17 2129  . busPIRone (BUSPAR) tablet 5 mg  5 mg Oral TID Laveda Abbe, NP   5 mg at 08/09/17 1255  . feeding supplement (ENSURE ENLIVE) (ENSURE ENLIVE) liquid 237 mL  237 mL Oral TID BM Cobos, Rockey Situ, MD   237 mL at 08/09/17 0906  . hydrOXYzine (ATARAX/VISTARIL) tablet 25 mg  25 mg Oral Q6H PRN Laveda Abbe, NP   25 mg at 08/09/17 1255  . Influenza vac split quadrivalent PF (FLUARIX) injection 0.5 mL  0.5 mL Intramuscular Tomorrow-1000 Cobos, Fernando A, MD      . magnesium hydroxide (MILK OF MAGNESIA) suspension 30 mL  30 mL Oral Daily PRN Laveda Abbe, NP      . mirtazapine (REMERON) tablet 30 mg  30 mg Oral QHS Laveda Abbe, NP   30 mg at 08/08/17 2129  . nicotine (NICODERM CQ - dosed in mg/24 hr) patch 7 mg  7 mg Transdermal Daily Laveda Abbe, NP   7 mg at 08/09/17 0900  . pantoprazole (PROTONIX) EC tablet 20 mg  20 mg Oral Daily Laveda Abbe, NP   20 mg at 08/09/17 0900  . pneumococcal 23 valent vaccine (PNU-IMMUNE) injection 0.5 mL  0.5 mL Intramuscular Tomorrow-1000 Cobos, Fernando A, MD       . traZODone (DESYREL) tablet 50 mg  50 mg Oral QHS PRN Laveda Abbe, NP       Lab Results: No results found for this or any previous visit (from the past 48 hour(s)).  Blood Alcohol level:  Lab Results  Component Value Date   ETH <10 08/07/2017   ETH <  5 01/21/2016   Metabolic Disorder Labs: Lab Results  Component Value Date   HGBA1C 6.0 (H) 02/21/2015   MPG 126 02/21/2015   No results found for: PROLACTIN Lab Results  Component Value Date   CHOL 138 02/21/2015   TRIG 60 02/21/2015   HDL 51 02/21/2015   CHOLHDL 2.7 02/21/2015   VLDL 12 02/21/2015   LDLCALC 75 02/21/2015   LDLCALC 86 04/03/2009   Physical Findings: AIMS: Facial and Oral Movements Muscles of Facial Expression: None, normal Lips and Perioral Area: None, normal Jaw: None, normal Tongue: None, normal,Extremity Movements Upper (arms, wrists, hands, fingers): None, normal Lower (legs, knees, ankles, toes): None, normal, Trunk Movements Neck, shoulders, hips: None, normal, Overall Severity Severity of abnormal movements (highest score from questions above): None, normal Incapacitation due to abnormal movements: None, normal Patient's awareness of abnormal movements (rate only patient's report): No Awareness, Dental Status Current problems with teeth and/or dentures?: No Does patient usually wear dentures?: No  CIWA:    COWS:     Musculoskeletal: Strength & Muscle Tone: within normal limits Gait & Station: normal Patient leans: N/A  Psychiatric Specialty Exam: Physical Exam  Nursing note and vitals reviewed.   Review of Systems  Psychiatric/Behavioral: Positive for depression ("Improving"), hallucinations (Hx. Psuchosis) and substance abuse. Negative for memory loss and suicidal ideas. The patient is not nervous/anxious and does not have insomnia.     Blood pressure 118/79, pulse 91, temperature 98.1 F (36.7 C), temperature source Oral, resp. rate 16, height 5\' 11"  (1.803 m), weight 50.8  kg (112 lb), last menstrual period 08/06/2017.Body mass index is 15.62 kg/m.  General Appearance: Casual and Fairly Groomed  Eye Contact:  Good  Speech:  Clear and Coherent and Normal Rate  Volume:  Normal  Mood:  Anxious and Depressed  Affect:  Appropriate, Congruent, Constricted and Depressed  Thought Process:  Coherent and Goal Directed  Orientation:  Full (Time, Place, and Person)  Thought Content: Delusions on admission, now improved.  Suicidal Thoughts:  Denies  Homicidal Thoughts:  No  Memory:  Immediate;   Good Recent;   Good Remote;   Good  Judgement:  Poor  Insight:  Fair  Psychomotor Activity:  Normal  Concentration:  Concentration: Good  Recall:  Fair  Fund of Knowledge:  Fair  Language:  Good  Akathisia:  No  Handed:  Right  AIMS (if indicated):     Assets:  Communication Skills Desire for Improvement Housing Social Support Vocational/Educational  ADL's:  Intact  Cognition:  WNL  Sleep:  Number of Hours: 6.25     Treatment Plan Summary: Daily contact with patient to assess and evaluate symptoms and progress in treatment: Patient presents improved today. She is reporting improved symptoms. She is requesting gabapentin for agitation. We are continuing her care to stabilization.  Psychiatric: SUD (Amphetamine, Cocaine, THC)  Medical: Hx. Anemia Hx. GERD.  Psychosocial:  Employed. Mother, good support system.  PLAN: 1.Continue Abilify 15 mg daily for mood control, Buspar 5 mg tid for anxiety, Hydroxyzine 25 mg tid prn for anxiety, Trazodone 50 mg Q hs for insomnia.  Will initiate Gabapentin 100 mg tid for agitation/substance withdrawal symptoms,  2. Encourage unit groups and therapeutic activities 3. Monitor mood, behavior and interaction with peers 4. Motivational enhancement  5. SW to facilitate aftercare.   Armandina StammerNwoko, Mena Simonis I, NP, PMHNP, FNP-BC. 08/09/2017, 5:06 PM

## 2017-08-10 MED ORDER — BUSPIRONE HCL 5 MG PO TABS: 5.0000 mg | ORAL_TABLET | Freq: Three times a day (TID) | ORAL | Status: DC

## 2017-08-10 MED ORDER — TRAZODONE HCL 50 MG PO TABS: 50.0000 mg | ORAL_TABLET | Freq: Every evening | ORAL | 0 refills | Status: DC | PRN

## 2017-08-10 MED ORDER — NICOTINE 7 MG/24HR TD PT24: 7.0000 mg | MEDICATED_PATCH | Freq: Every day | TRANSDERMAL | 0 refills | Status: DC

## 2017-08-10 MED ORDER — BUSPIRONE HCL 10 MG PO TABS: 5.0000 mg | ORAL_TABLET | Freq: Three times a day (TID) | ORAL | Status: DC

## 2017-08-10 MED ORDER — ARIPIPRAZOLE 15 MG PO TABS: 15.0000 mg | ORAL_TABLET | Freq: Every day | ORAL | 0 refills | Status: DC

## 2017-08-10 MED ORDER — HYDROXYZINE HCL 25 MG PO TABS: 25.0000 mg | ORAL_TABLET | Freq: Four times a day (QID) | ORAL | 0 refills | Status: DC | PRN

## 2017-08-10 MED ORDER — BUSPIRONE HCL 5 MG PO TABS: 5.0000 mg | ORAL_TABLET | Freq: Three times a day (TID) | ORAL | 0 refills | Status: DC

## 2017-08-10 MED ORDER — PANTOPRAZOLE SODIUM 20 MG PO TBEC: 20.0000 mg | DELAYED_RELEASE_TABLET | Freq: Every day | ORAL | 0 refills | Status: DC

## 2017-08-10 MED ORDER — MIRTAZAPINE 30 MG PO TABS: 30.0000 mg | ORAL_TABLET | Freq: Every day | ORAL | 0 refills | Status: DC

## 2017-08-10 MED ORDER — ENSURE ENLIVE PO LIQD: ORAL | 12 refills | Status: DC

## 2017-08-10 NOTE — Discharge Summary (Signed)
Physician Discharge Summary Note  Patient:  Natalie Fischer is an 36 y.o., female  MRN:  [id number]  DOB:  1980/12/07  Patient phone:  [phone] (home)   Patient address:   [address] [place] [address],   Total Time spent with patient: Greater than 30 minutes  Date of Admission:  08/07/2017  Date of Discharge: 08-10-17  Reason for Admission: Worsening drug use, worsening symptoms of depression & suicidal ideations.  Principal Problem: Polysubstance (excluding opioids) dependence, daily use [facility]) Discharge Diagnoses: Patient Active Problem List   Diagnosis Date Noted  . Polysubstance (excluding opioids) dependence, daily use (HCC) [F19.20] 08/08/2017  . Bipolar 1 disorder, depressed, severe (HCC) [F31.4] 08/07/2017  . 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 Cocaine abuse (HCC) [F14.10] 01/22/2016  . Bipolar 1 disorder, depressed, moderate (HCC) [F31.32] 02/20/2015  . Cocaine use disorder, severe, dependence (HCC) [F14.20]   . Cannabis use disorder, moderate, dependence (HCC) [F12.20]   . ANEMIA [D64.9] 05/11/2010  . LOSS OF WEIGHT [R63.4] 03/07/2008  . ALLERGIC RHINITIS [J30.9] 01/18/2008  . INSOMNIA [G47.00] 01/18/2008  . ANXIETY [F41.1] 08/31/2007  . GERD [K21.9] 06/18/2007  . CONSTIPATION [K59.00] 06/18/2007   Musculoskeletal: Strength & Muscle Tone: within normal limits Gait & Station: normal Patient leans: N/A  Psychiatric Specialty Exam: Physical Exam  Constitutional: She is oriented to person, place, and time. She appears well-developed.  HENT:  Head: Normocephalic.  Eyes: Pupils are equal, round, and reactive to light.  Neck: Normal range of motion.  Cardiovascular: Normal rate.   Respiratory: Effort normal. No stridor.  GI: Soft.  Genitourinary:  Genitourinary Comments: Denies any issues in this area  Musculoskeletal: Normal range of motion.  Neurological: She is alert and oriented to person, place, and time.  Skin:  Skin is warm.  Psychiatric: Her speech is normal and behavior is normal. Judgment and thought content normal. Her mood appears not anxious. Her affect is not angry, not blunt, not labile and not inappropriate. Cognition and memory are normal. She does not exhibit a depressed mood.    Review of Systems  Constitutional: Negative.   HENT: Negative.   Eyes: Negative.   Respiratory: Negative.   Cardiovascular: Negative.   Gastrointestinal: Negative.   Genitourinary: Negative.   Musculoskeletal: Negative.   Skin: Negative.   Neurological: Negative.   Endo/Heme/Allergies: Negative.   Psychiatric/Behavioral: Positive for depression (Stable), hallucinations (Hx of) and substance abuse (Cocaine dependence, Cannabis dependence). Negative for memory loss and suicidal ideas. The patient has insomnia (Stable). The patient is not nervous/anxious.     Blood pressure 96/74, pulse (!) 102, temperature 98.4 F (36.9 C), temperature source Oral, resp. rate 16, height 5\' 11"  (1.803 m), weight 50.8 kg (112 lb), last menstrual period 08/06/2017.Body mass index is 15.62 kg/m.  See Md's SRA   Have you used any form of tobacco in the last 30 days? (Cigarettes, Smokeless Tobacco, Cigars, and/or Pipes): Yes   Has this patient used any form of tobacco in the last 30 days? (Cigarettes, Smokeless   Tobacco, Cigars, and/or Pipes): Yes, provided with a nicotine patch 7 mg for smoking cessation.  Past Medical History:  Past Medical History:  Diagnosis Date  . Anxiety   . Bipolar 1 disorder (HCC)   . Carpal tunnel syndrome of right wrist 2016  . Chlamydia   . Constipation   . Ectopic pregnancy 2008 and 2014   2008 required surgery, 2nd Misoprostol  . GERD (gastroesophageal reflux disease)   . Insomnia   .  Trichomonas vaginitis   . UTI (lower urinary tract infection)     Past Surgical History:  Procedure Laterality Date  . ECTOPIC PREGNANCY SURGERY Right 2008   Family History:  Family History  Problem  Relation Age of Onset  . Cancer Sister 42  . COPD Sister   . Alcohol abuse Neg Hx   . Arthritis Neg Hx   . Asthma Neg Hx   . Birth defects Neg Hx   . Depression Neg Hx   . Diabetes Neg Hx   . Drug abuse Neg Hx   . Early death Neg Hx   . Hearing loss Neg Hx   . Heart disease Neg Hx   . Hyperlipidemia Neg Hx   . Hypertension Neg Hx   . Kidney disease Neg Hx   . Learning disabilities Neg Hx   . Mental illness Neg Hx   . Mental retardation Neg Hx   . Miscarriages / Stillbirths Neg Hx   . Stroke Neg Hx   . Vision loss Neg Hx   . Varicose Veins Neg Hx    Social History:  History  Alcohol Use  . 0.0 oz/week    Comment: occassional     History  Drug Use No    Comment: Xanax    Social History   Social History  . Marital status: Single    Spouse name: N/A  . Number of children: 0  . Years of education: college-1   Occupational History  . unemployed     Previously worked [employer], Clinical biochemist.   Social History Main Topics  . Smoking status: Current Some Day Smoker    Packs/day: 0.25    Types: Cigarettes    Start date: 05/15/1999    Last attempt to quit: 04/12/2014  . Smokeless tobacco: Never Used  . Alcohol use 0.0 oz/week     Comment: occassional  . Drug use: No     Comment: Xanax  . Sexual activity: Yes   Other Topics Concern  . None   Social History Narrative   Born in [place], [place] to [place], in 1998 when her mother got a job in one of the [place] here.   Now living with her father.  Sometimes with her sister.   Parents separated.      Hospital Course: (Per Md's admission SRA): Pt is a 36 y/o AA female who presented to [facility] voluntarily with worsening depression, SI with plan to cut herself, and worsening use of cocaine. Pt reports using between "half a gram and a gram" per day of cocaine via insufflation. She notes feeling increasingly dependent on cocaine use to improve her mood as she feels "flat" without it. She describes  depressive symptoms of poor mood, low motivation, poor sleep, guilty feelings, low energy, poor concentration, poor appetite, and suicidal ideations with plan to cut on her forearms. However, she cites that she wants to perform better at her job at [employer] and wants to maintain her employment. Pt denies HI/AH/VH, but she describes struggling with an internal thought which she describes as "a different version of myself" that attempts to motivate her to use drugs. Pt goes on to explain that sometimes she looks in the mirror and it appears that she is someone else, but she notes that she still has control of her behaviors at all times. Pt states she felt overwhelmed with guilt related to her substance use, so she contacted the crisis line. Pt reports  she has been compliant with outpatient regimen of Abilify Maintena, remeron, vistaril, and buspar. She agrees to be restarted on home medication regimen with the addition of oral abilify 15mg  qDay to supplement. She is able to contract for safety while admitted to [facility]. She expresses interest in pursuing treatment for cocaine use. She had no further questions, comments, or concerns.  Natalie Fischer is known on this [facility] adult unit from previous admission for  mood stabilization treatments. She does have drug problems. She was re-admitted to the [facility] adult unit this time for Suicidal ideations related to worsening symptoms of depression & increased drug use . She relapsed on drugs & has been using on daily basis. She does not however, need detox treatment as she was not presenting with any substance withdrawal symptoms. She was however, medicated & discharged on; Abilify 15 mg for 2 weeks. She was already receiving monthly Abilify maintena injections which she received a week prior to her coming to the hospital, Buspar 5 mg for anxiety, Hydroxyzine 25 mg prn for anxiety, Mirtazapine 30 mg for depression, Nicotine patch 7 mg for smoking cessation & Trazodone 50 mg for  insomnia. She received other medication regimen for the other medical issues presented. She tolerated her treatment regimen without any adverse effects or reactions reported.  During Natalie Fischer's hospital stay, she was evaluated daily by a clinical provider to assure her response to her treatment regimen. As the day goes by, improvement was noted by her reports of decreasing symptoms, improved mood, behavior & participation in the unit programming. She was required on daily basis to complete a self-inventory asssessment noting mood, any new symptoms, anxiety and or concerns. Her symptoms responded well to her treatment regimen. Also, being in a therapeutic and supportive environment assisted in her mood stability. She worked closely with the treatment team and case manager to develop a discharge plan with appropriate goals to maintain mood stability after discharge.   On this day of her hospital discharge, Natalie Fischer is in much improved condition than upon admission. Her symptoms were reported as significantly decreased or resolved completely. Upon discharge, she adamantly denies any SI/HI & voiced no AVH. She was motivated to continue taking medication with a goal of continued improvement in mental health. And for medication management & routine psychiatric care, she will be receiving these services from the [facility].  Natalie Fischer received from the [facility] pharmacy, a 7 days worth, supply samples of her [facility] discharge medications. She left [facility] with all personal belongings in no apparent distress. Transportation per mother.  Discharge Vitals:   Blood pressure 96/74, pulse (!) 102, temperature 98.4 F (36.9 C), temperature source Oral, resp. rate 16, height 5\' 11"  (1.803 m), weight 50.8 kg (112 lb), last menstrual period 08/06/2017. Body mass index is 15.62 kg/m.  Lab Results:   No results found for this or any previous visit (from the past 72 hour(s)).  Physical Findings:  AIMS: Facial and Oral  Movements Muscles of Facial Expression: None, normal Lips and Perioral Area: None, normal Jaw: None, normal Tongue: None, normal,Extremity Movements Upper (arms, wrists, hands, fingers): None, normal Lower (legs, knees, ankles, toes): None, normal, Trunk Movements Neck, shoulders, hips: None, normal, Overall Severity Severity of abnormal movements (highest score from questions above): None, normal Incapacitation due to abnormal movements: None, normal Patient's awareness of abnormal movements (rate only patient's report): No Awareness, Dental Status Current problems with teeth and/or dentures?: No Does patient usually wear dentures?: No  CIWA:    COWS:  See Psychiatric Specialty Exam and Suicide Risk Assessment completed by Attending Physician prior to discharge.  Discharge destination:  Home  Is patient on multiple antipsychotic therapies at discharge:  No   Has Patient had three or more failed trials of antipsychotic monotherapy by history:  No  Recommended Plan for Multiple Antipsychotic Therapies: NA  Allergies as of 08/10/2017      Reactions   Depakene [divalproex Sodium] Other (See Comments)   "made me feel like I was going to pass out"      Medication List    TAKE these medications     Indication  ARIPiprazole 15 MG tablet Commonly known as:  ABILIFY Take 1 tablet (15 mg total) by mouth at bedtime. For mood control What changed:  additional instructions  Another medication with the same name was removed. Continue taking this medication, and follow the directions you see here.  Indication:  Mood control   busPIRone 5 MG tablet Commonly known as:  BUSPAR Take 1 tablet (5 mg total) by mouth 3 (three) times daily. For anxiety What changed:  additional instructions  Indication:  Anxiety Disorder, Major Depressive Disorder   feeding supplement (ENSURE ENLIVE) Liqd For nutritional supplement . What changed:  additional instructions  Indication:  Nutritional  supplement   hydrOXYzine 25 MG tablet Commonly known as:  ATARAX/VISTARIL Take 1 tablet (25 mg total) by mouth every 6 (six) hours as needed for anxiety. What changed:  reasons to take this  Indication:  Feeling Anxious   mirtazapine 30 MG tablet Commonly known as:  REMERON Take 1 tablet (30 mg total) by mouth at bedtime. For depression  Indication:  Trouble Sleeping, Major Depressive Disorder   nicotine 7 mg/24hr patch Commonly known as:  NICODERM CQ - dosed in mg/24 hr Place 1 patch (7 mg total) onto the skin daily. For smoking cessation  Indication:  Nicotine Addiction   pantoprazole 20 MG tablet Commonly known as:  PROTONIX Take 1 tablet (20 mg total) by mouth daily. For acid reflux  Indication:  Gastroesophageal Reflux Disease   traZODone 50 MG tablet Commonly known as:  DESYREL Take 1 tablet (50 mg total) by mouth at bedtime as needed for sleep.  Indication:  Trouble Sleeping      Follow-up Information    [facility] Follow up on 08/11/2017.   Specialty:  Behavioral Health Why:  Hospital follow-up on Friday at 10:20AM. Please bring hospital discharge paperwork with you to this appt. Thank you.  Contact informationElpidio Eric: [address] [place] [address] [phone]          Follow-up recommendations: Activity:  As tolerated Diet: As recommended by your primary care doctor. Keep all scheduled follow-up appointments as recommended.    Comments: Patient is instructed prior to discharge to: Take all medications as prescribed by his/her mental healthcare provider. Report any adverse effects and or reactions from the medicines to his/her outpatient provider promptly. Patient has been instructed & cautioned: To not engage in alcohol and or illegal drug use while on prescription medicines. In the event of worsening symptoms, patient is instructed to call the crisis hotline, 911 and or go to the nearest ED for appropriate evaluation and treatment of symptoms. To follow-up  with his/her primary care provider for your other medical issues, concerns and or health care needs.     Signed: Sanjuana KavaNwoko, Agnes I, PMHNP, FNP-BC 08/11/2017, 9:10 AM   Patient seen, Suicide Assessment Completed.  Disposition Plan Reviewed   Pt is a 36 y/o F  who presented with worsening depression, SI to cut herself, and use of cocaine. Pt was restarted on outpatient medication regimen and allowed to stabilize on the inpatient unit. With rest, attendance of group therapy, detoxification from cocaine use, and participation in the therapeutic milieu, pt reports improvement of mood symptoms. She denies SI/HI/AH/VH. She is future-oriented regarding returning to her outpatient treatment and her employment. She is able to engage in safety planning including returning to [facility] or contacting emergency services should she feel unable to maintain her own safety. She agrees to follow up with outpatient care at [facility] for her psychiatric needs. She plans to attend AA meetings to help with maintaining sobriety from cocaine. She had no further questions, comments, or concerns.     Plan Of Care/Follow-up recommendations:  - Discharge to outpatient level of care - Continue current medication regimen without changes: Abilify Maintena (last injection 08/02/17), oral abilify 15mg  po qDay, buspar 5mg  TID, atarax 25mg  q6h prn anxiety, remeron 30mg  po qhs, and trazodone 50mg  qhs prn insomnia    Activity:  as tolerated Diet:  normal Tests:  N/A Other:  see above for DC treatment plan  Micheal Likens, MD

## 2017-08-10 NOTE — Progress Notes (Signed)
Pt discharged home with her mother. Pt was ambulatory, stable and appreciative at that time. All papers and prescriptions were given and valuables returned. Verbal understanding expressed. Denies SI/HI and A/VH. Pt given opportunity to express concerns and ask questions.  

## 2017-08-10 NOTE — Progress Notes (Signed)
  [facility]  Will you be returning to the same living situation after discharge:  Yes,  home with mother At discharge, do you have transportation home?: Yes,  mother Do you have the ability to pay for your medications: Yes, mental health  Release of information consent forms completed and submitted to medical records by CSW.  Patient to Follow up at: Follow-up Information    [facility] Follow up on 08/11/2017.   Specialty:  Behavioral Health Why:  Hospital follow-up on Friday at 10:20AM. Please bring hospital discharge paperwork with you to this appt. Thank you.  Contact information: [address] [place] [address] [phone]           Next level of care provider [facility]:yes  Safety Planning and Suicide Prevention discussed: Yes,  SPE completed with pt; pt declined to consent to family contact.  Have you used any form of tobacco in the last 30 days? (Cigarettes, Smokeless Tobacco, Cigars, and/or Pipes): Yes  Has patient been referred to the Quitline?: Patient refused referral  Patient has been referred for addiction treatment: Yes  Pulte HomesHeather N Smart, LCSW 08/10/2017, 10:42 AM

## 2017-08-10 NOTE — BHH Suicide Risk Assessment (Signed)
[facility] INPATIENT:  Family/Significant Other Suicide Prevention Education  Suicide Prevention Education:  Patient Refusal for Family/Significant Other Suicide Prevention Education: The patient Natalie Fischer has refused to provide written consent for family/significant other to be provided Family/Significant Other Suicide Prevention Education during admission and/or prior to discharge.  Physician notified.  SPE completed with pt, as pt refused to consent to family contact. SPI pamphlet provided to pt and pt was encouraged to share information with support network, ask questions, and talk about any concerns relating to SPE. Pt denies access to guns/firearms and verbalized understanding of information provided. Mobile Crisis information also provided to pt.   Franny Selvage N Smart LCSW 08/10/2017, 10:41 AM

## 2017-08-10 NOTE — [facility]HH Suicide Risk Assessment (Signed)
[facility]   Principal Problem: Polysubstance (excluding opioids) dependence, daily use (HCC) and Bipolar 1 disorder Discharge Diagnoses:  Patient Active Problem List   Diagnosis Date Noted  . Polysubstance (excluding opioids) dependence, daily use (HCC) [F19.20] 08/08/2017  . Bipolar 1 disorder, depressed, severe (HCC) [F31.4] 08/07/2017  . 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 Cocaine abuse (HCC) [F14.10] 01/22/2016  . Bipolar 1 disorder, depressed, moderate (HCC) [F31.32] 02/20/2015  . Cocaine use disorder, severe, dependence (HCC) [F14.20]   . Cannabis use disorder, moderate, dependence (HCC) [F12.20]   . ANEMIA [D64.9] 05/11/2010  . LOSS OF WEIGHT [R63.4] 03/07/2008  . ALLERGIC RHINITIS [J30.9] 01/18/2008  . INSOMNIA [G47.00] 01/18/2008  . ANXIETY [F41.1] 08/31/2007  . GERD [K21.9] 06/18/2007  . CONSTIPATION [K59.00] 06/18/2007    Total Time spent with patient: 30 minutes  Musculoskeletal: Strength & Muscle Tone: within normal limits Gait & Station: normal Patient leans: N/A  Psychiatric Specialty Exam: Review of Systems  Constitutional: Negative for chills and fever.  Respiratory: Negative for cough, shortness of breath and wheezing.   Cardiovascular: Negative for chest pain.  Gastrointestinal: Negative for abdominal pain, heartburn, nausea and vomiting.  Psychiatric/Behavioral: Negative for depression, hallucinations and suicidal ideas. The patient is not nervous/anxious and does not have insomnia.     Blood pressure 96/74, pulse (!) 102, temperature 98.4 F (36.9 C), temperature source Oral, resp. rate 16, height 5\' 11"  (1.803 m), weight 50.8 kg (112 lb), last menstrual period 08/06/2017.Body mass index is 15.62 kg/m.  General Appearance: Fairly Groomed  Patent attorneyye Contact::  Good  Speech:  Clear and Coherent and Normal Rate  Volume:  Normal  Mood:  Euthymic  Affect:  Appropriate and Congruent  Thought Process:  Goal  Directed  Orientation:  Full (Time, Place, and Person)  Thought Content:  Logical  Suicidal Thoughts:  No  Homicidal Thoughts:  No  Memory:  Immediate;   Good Recent;   Good Remote;   Good  Judgement:  Fair  Insight:  Fair  Psychomotor Activity:  Normal  Concentration:  Fair  Recall:  Good  Fund of Knowledge:Fair  Language: Good  Akathisia:  No  Handed:    AIMS (if indicated):     Assets:  Research scientist (life sciences)Communication Skills Social Support Vocational/Educational  Sleep:  Number of Hours: 6.25  Cognition: WNL  ADL's:  Intact   Mental Status Per Nursing Assessment::   On Admission:  Self-harm thoughts  Demographic Factors:  NA  Loss Factors: NA  Historical Factors: Family history of mental illness or substance abuse and Impulsivity  Risk Reduction Factors:   Employed  Continued Clinical Symptoms:  Bipolar Disorder:   Depressive phase Alcohol/Substance Abuse/Dependencies  Cognitive Features That Contribute To Risk:  None    Suicide Risk:  Minimal: No identifiable suicidal ideation.  Patients presenting with no risk factors but with morbid ruminations; may be classified as minimal risk based on the severity of the depressive symptoms  Follow-up Information    [facility] Follow up on 08/11/2017.   Specialty:  Behavioral Health Why:  Hospital follow-up on Friday at 10:20AM. Please bring hospital discharge paperwork with you to this appt. Thank you.  Contact information: [address] [place] [address] [phone]           Subjective data: Pt is a 36 y/o F who presented with worsening depression, SI to cut herself, and use of cocaine. Pt was restarted on outpatient medication regimen and allowed to stabilize on the inpatient  unit. With rest, attendance of group therapy, detoxification from cocaine use, and participation in the therapeutic milieu, pt reports improvement of mood symptoms. She denies SI/HI/AH/VH. She is future-oriented regarding returning to her outpatient  treatment and her employment. She is able to engage in safety planning including returning to [facility] or contacting emergency services should she feel unable to maintain her own safety. She agrees to follow up with outpatient care at [facility] for her psychiatric needs. She plans to attend AA meetings to help with maintaining sobriety from cocaine. She had no further questions, comments, or concerns.     Plan Of Care/Follow-up recommendations:  - Discharge to outpatient level of care - Continue current medication regimen without changes: Abilify Maintena (last injection 08/02/17), oral abilify 15mg  po qDay, buspar 5mg  TID, atarax 25mg  q6h prn anxiety, remeron 30mg  po qhs, and trazodone 50mg  qhs prn insomnia    Activity:  as tolerated Diet:  normal Tests:  N/A Other:  see above for DC treatment plan  Micheal Likens, MD 08/10/2017, 10:36 AM

## 2017-08-10 NOTE — BHH Group Notes (Signed)
The focus of this group is to educate the patient on the purpose and policies of crisis stabilization and provide a format to answer questions about their admission.  The group details unit policies and expectations of patients while admitted.  Patient did not attend 0900 nurse education orientation group this morning.  Patient stayed in room.  

## 2018-01-27 ENCOUNTER — Inpatient Hospital Stay (HOSPITAL_COMMUNITY)
Admission: AD | Admit: 2018-01-27 | Discharge: 2018-01-27 | Disposition: A | Discharge status: Home / Self Care | Payer: Self-pay | Source: Ambulatory Visit | Attending: Obstetrics and Gynecology | Admitting: Obstetrics and Gynecology

## 2018-01-27 ENCOUNTER — Encounter (HOSPITAL_COMMUNITY): Payer: Self-pay | Admitting: *Deleted

## 2018-01-27 DIAGNOSIS — Z87891 Personal history of nicotine dependence: Secondary | ICD-10-CM | POA: Insufficient documentation

## 2018-01-27 DIAGNOSIS — R300 Dysuria: Secondary | ICD-10-CM

## 2018-01-27 DIAGNOSIS — N898 Other specified noninflammatory disorders of vagina: Secondary | ICD-10-CM

## 2018-01-27 DIAGNOSIS — Z9189 Other specified personal risk factors, not elsewhere classified: Secondary | ICD-10-CM

## 2018-01-27 LAB — URINALYSIS, ROUTINE W REFLEX MICROSCOPIC
Bacteria, UA: NONE SEEN
Bilirubin Urine: NEGATIVE
GLUCOSE, UA: NEGATIVE mg/dL
Ketones, ur: NEGATIVE mg/dL
Nitrite: NEGATIVE
PROTEIN: 100 mg/dL — AB
Specific Gravity, Urine: 1.029 (ref 1.005–1.030)
pH: 5 (ref 5.0–8.0)

## 2018-01-27 LAB — WET PREP, GENITAL
CLUE CELLS WET PREP: NONE SEEN
Sperm: NONE SEEN
TRICH WET PREP: NONE SEEN
YEAST WET PREP: NONE SEEN

## 2018-01-27 LAB — RAPID URINE DRUG SCREEN, HOSP PERFORMED
AMPHETAMINES: NOT DETECTED
BARBITURATES: NOT DETECTED
Benzodiazepines: NOT DETECTED
Cocaine: POSITIVE — AB
OPIATES: NOT DETECTED
TETRAHYDROCANNABINOL: POSITIVE — AB

## 2018-01-27 LAB — POCT PREGNANCY, URINE: Preg Test, Ur: NEGATIVE

## 2018-01-27 MED ORDER — KETOROLAC TROMETHAMINE 60 MG/2ML IM SOLN
60.0000 mg | Freq: Once | INTRAMUSCULAR | Status: AC
  Administered 2018-01-27: 60 mg via INTRAMUSCULAR
  Filled 2018-01-27: qty 2

## 2018-01-27 NOTE — MAU Note (Signed)
Pt reports burning with urination and dysuria. Yellow vaginal discharge. , lower abd pain.

## 2018-01-27 NOTE — Progress Notes (Addendum)
Wet prep & GC/Chlamydia cultures obtained by Marlynn Perking. Lawson, CNM

## 2018-01-27 NOTE — Discharge Instructions (Signed)

## 2018-01-27 NOTE — MAU Provider Note (Signed)
History   37 yo female not pregnant in with dysuruia and vaginal discharge for several days now. Pt states she is also on her period.   CSN: [id number]  Arrival date & time 01/27/18  1239   None     Chief Complaint  Patient presents with  . Vaginal Discharge  . Vaginal Pain  . Dysuria  . Abdominal Pain    HPI  Past Medical History:  Diagnosis Date  . Anxiety   . Bipolar 1 disorder (HCC)   . Carpal tunnel syndrome of right wrist 2016  . Chlamydia   . Constipation   . Ectopic pregnancy 2008 and 2014   2008 required surgery, 2nd Misoprostol  . GERD (gastroesophageal reflux disease)   . Insomnia   . Trichomonas vaginitis   . UTI (lower urinary tract infection)     Past Surgical History:  Procedure Laterality Date  . ECTOPIC PREGNANCY SURGERY Right 2008    Family History  Problem Relation Age of Onset  . Cancer Sister 246  . COPD Sister   . Alcohol abuse Neg Hx   . Arthritis Neg Hx   . Asthma Neg Hx   . Birth defects Neg Hx   . Depression Neg Hx   . Diabetes Neg Hx   . Drug abuse Neg Hx   . Early death Neg Hx   . Hearing loss Neg Hx   . Heart disease Neg Hx   . Hyperlipidemia Neg Hx   . Hypertension Neg Hx   . Kidney disease Neg Hx   . Learning disabilities Neg Hx   . Mental illness Neg Hx   . Mental retardation Neg Hx   . Miscarriages / Stillbirths Neg Hx   . Stroke Neg Hx   . Vision loss Neg Hx   . Varicose Veins Neg Hx     Social History   Tobacco Use  . Smoking status: Former Smoker    Packs/day: 0.25    Types: Cigarettes    Start date: 05/15/1999    Last attempt to quit: 04/12/2014    Years since quitting: 3.7  . Smokeless tobacco: Never Used  Substance Use Topics  . Alcohol use: Yes    Alcohol/week: 0.0 oz    Comment: occassional  . Drug use: No    Types: Cocaine    Comment: Xanax    OB History    Gravida  2   Para      Term      Preterm      AB  2   Living  0     SAB      TAB      Ectopic  2   Multiple      Live  Births              Review of Systems  Constitutional: Negative.   HENT: Negative.   Eyes: Negative.   Respiratory: Negative.   Cardiovascular: Negative.   Gastrointestinal: Negative.   Endocrine: Negative.   Genitourinary: Positive for dysuria and vaginal bleeding.  Musculoskeletal: Negative.   Skin: Negative.   Allergic/Immunologic: Negative.   Neurological: Negative.   Hematological: Negative.   Psychiatric/Behavioral: Negative.     Allergies  Depakene [divalproex sodium]  Home Medications    BP 100/64 (BP Location: Right Arm)   Pulse 88   Temp 98.1 F (36.7 C) (Oral)   Resp 16   Ht 5\' 11"  (1.803 m)   Wt 123 lb (55.8 kg)   LMP  01/22/2018   SpO2 100%   BMI 17.16 kg/m   Physical Exam  Constitutional: She is oriented to person, place, and time. She appears well-developed and well-nourished.  HENT:  Head: Normocephalic.  Eyes: Pupils are equal, round, and reactive to light.  Neck: Normal range of motion.  Cardiovascular: Normal rate, regular rhythm, normal heart sounds and intact distal pulses.  Pulmonary/Chest: Effort normal and breath sounds normal.  Abdominal: Soft. Bowel sounds are normal.  Genitourinary: Vagina normal and uterus normal.  Musculoskeletal: Normal range of motion.  Neurological: She is alert and oriented to person, place, and time. She has normal reflexes.  Skin: Skin is warm and dry.  Psychiatric: She has a normal mood and affect. Her behavior is normal. Judgment and thought content normal.    MAU Course  Procedures (including critical care time)  Labs Reviewed  URINALYSIS, ROUTINE W REFLEX MICROSCOPIC - Abnormal; Notable for the following components:      Result Value   Color, Urine AMBER (*)    APPearance CLOUDY (*)    Hgb urine dipstick LARGE (*)    Protein, ur 100 (*)    Leukocytes, UA MODERATE (*)    Squamous Epithelial / LPF 6-30 (*)    All other components within normal limits  WET PREP, GENITAL  RAPID URINE DRUG  SCREEN, HOSP PERFORMED  POCT PREGNANCY, URINE  GC/CHLAMYDIA PROBE AMP ([place]) NOT AT [facility]   No results found.   1. Vaginal discharge   2. Dysuria       MDM  VSS, labs stable, wet prep neg, cultures pending. Will d/c home in stable condition.

## 2018-01-29 LAB — GC/CHLAMYDIA PROBE AMP (~~LOC~~) NOT AT ARMC
Chlamydia: NEGATIVE
Neisseria Gonorrhea: NEGATIVE

## 2018-03-17 ENCOUNTER — Other Ambulatory Visit: Payer: Self-pay

## 2018-03-17 ENCOUNTER — Encounter (HOSPITAL_COMMUNITY): Payer: Self-pay | Admitting: *Deleted

## 2018-03-17 ENCOUNTER — Emergency Department (HOSPITAL_COMMUNITY)
Admission: EM | Admit: 2018-03-17 | Discharge: 2018-03-17 | Disposition: A | Discharge status: Home / Self Care | Payer: Self-pay | Attending: Emergency Medicine | Admitting: Emergency Medicine

## 2018-03-17 DIAGNOSIS — Z79899 Other long term (current) drug therapy: Secondary | ICD-10-CM | POA: Insufficient documentation

## 2018-03-17 DIAGNOSIS — R55 Syncope and collapse: Secondary | ICD-10-CM | POA: Insufficient documentation

## 2018-03-17 DIAGNOSIS — Z87891 Personal history of nicotine dependence: Secondary | ICD-10-CM | POA: Insufficient documentation

## 2018-03-17 LAB — URINALYSIS, ROUTINE W REFLEX MICROSCOPIC
Bilirubin Urine: NEGATIVE
Glucose, UA: NEGATIVE mg/dL
Ketones, ur: NEGATIVE mg/dL
Nitrite: NEGATIVE
Protein, ur: 100 mg/dL — AB
Specific Gravity, Urine: 1.01 (ref 1.005–1.030)
pH: 6 (ref 5.0–8.0)

## 2018-03-17 LAB — CBC
HEMATOCRIT: 35.4 % — AB (ref 36.0–46.0)
HEMOGLOBIN: 11.5 g/dL — AB (ref 12.0–15.0)
MCH: 29.3 pg (ref 26.0–34.0)
MCHC: 32.5 g/dL (ref 30.0–36.0)
MCV: 90.1 fL (ref 78.0–100.0)
Platelets: 165 10*3/uL (ref 150–400)
RBC: 3.93 MIL/uL (ref 3.87–5.11)
RDW: 12.7 % (ref 11.5–15.5)
WBC: 5.4 10*3/uL (ref 4.0–10.5)

## 2018-03-17 LAB — BASIC METABOLIC PANEL
ANION GAP: 5 (ref 5–15)
BUN: 5 mg/dL — ABNORMAL LOW (ref 6–20)
CHLORIDE: 107 mmol/L (ref 101–111)
CO2: 26 mmol/L (ref 22–32)
Calcium: 8.8 mg/dL — ABNORMAL LOW (ref 8.9–10.3)
Creatinine, Ser: 0.89 mg/dL (ref 0.44–1.00)
GFR calc Af Amer: >60 mL/min (ref >60)
GFR calc non Af Amer: >60 mL/min (ref >60)
GLUCOSE: 95 mg/dL (ref 65–99)
POTASSIUM: 3.8 mmol/L (ref 3.5–5.1)
SODIUM: 138 mmol/L (ref 135–145)

## 2018-03-17 LAB — I-STAT BETA HCG BLOOD, ED (MC, WL, AP ONLY): I-stat hCG, quantitative: <5 m[IU]/mL (ref <5)

## 2018-03-17 MED ORDER — SODIUM CHLORIDE 0.9 % IV BOLUS
1000.0000 mL | Freq: Once | INTRAVENOUS | Status: AC
  Administered 2018-03-17: 1000 mL via INTRAVENOUS

## 2018-03-17 NOTE — Discharge Instructions (Signed)
Drink plenty of fluids.  Follow up with your primary care provider regarding today's visit. Return to the ER for new or concerning symptoms.

## 2018-03-17 NOTE — ED Provider Notes (Signed)
[facility] EMERGENCY DEPARTMENT Provider Note   CSN: [id number] Arrival date & time: 03/17/18  1133     History   Chief Complaint Chief Complaint  Patient presents with  . Loss of Consciousness    HPI Natalie Fischer is a 37 y.o. female with past medical history of bipolar 1 disorder, anxiety, GERD, polysubstance abuse, presenting to the ED with syncopal episode.  Patient states she was standing at the bus stop and began feeling hot and as if her vision was blackening.  She then had a witnessed syncopal episode without head trauma.  Her boyfriend witnessed the episode and states he caught her.  States she was out for just a few seconds and then came to consciousness.  Patient states she feels slightly lightheaded still.  Does endorse alcohol last night, states she had a few liquor and a few beer drinks.  Denies chest pain, palpitations, shortness of breath, headache, vision changes, or any other associated symptoms.  Reports this is not the first occurrence of the syncopal episodes.  The history is provided by the patient.    Past Medical History:  Diagnosis Date  . Anxiety   . Bipolar 1 disorder (HCC)   . Carpal tunnel syndrome of right wrist 2016  . Chlamydia   . Constipation   . Ectopic pregnancy 2008 and 2014   2008 required surgery, 2nd Misoprostol  . GERD (gastroesophageal reflux disease)   . Insomnia   . Trichomonas vaginitis   . UTI (lower urinary tract infection)     Patient Active Problem List   Diagnosis Date Noted  . Polysubstance (excluding opioids) dependence, daily use (HCC) 08/08/2017  . Bipolar 1 disorder, depressed, severe (HCC) 08/07/2017  . Mild benzodiazepine use disorder (HCC) 01/23/2016  . Cocaine abuse (HCC) 01/22/2016  . Bipolar 1 disorder, depressed, moderate (HCC) 02/20/2015  . Cocaine use disorder, severe, dependence (HCC)   . Cannabis use disorder, moderate, dependence (HCC)   . ANEMIA 05/11/2010  . LOSS OF WEIGHT  03/07/2008  . ALLERGIC RHINITIS 01/18/2008  . INSOMNIA 01/18/2008  . ANXIETY 08/31/2007  . GERD 06/18/2007  . CONSTIPATION 06/18/2007    Past Surgical History:  Procedure Laterality Date  . ECTOPIC PREGNANCY SURGERY Right 2008     OB History    Gravida  2   Para      Term      Preterm      AB  2   Living  0     SAB      TAB      Ectopic  2   Multiple      Live Births               Home Medications    Prior to Admission medications   Medication Sig Start Date End Date Taking? Authorizing Provider  ARIPiprazole (ABILIFY) 15 MG tablet Take 1 tablet (15 mg total) by mouth at bedtime. For mood control Patient not taking: Reported on 01/27/2018 08/10/17   Armandina Stammer I, NP  Brexpiprazole (REXULTI PO) Take 1 tablet by mouth daily.    [provider]  feeding supplement, ENSURE ENLIVE, (ENSURE ENLIVE) LIQD For nutritional supplement . 08/10/17   Armandina Stammer I, NP  ibuprofen (ADVIL,MOTRIN) 200 MG tablet Take 800 mg by mouth every 6 (six) hours as needed for headache or moderate pain.    [provider]  mirtazapine (REMERON) 30 MG tablet Take 1 tablet (30 mg total) by mouth at bedtime. For  depression 08/10/17   Armandina Stammer I, NP  pantoprazole (PROTONIX) 20 MG tablet Take 1 tablet (20 mg total) by mouth daily. For acid reflux 08/10/17   Sanjuana Kava, NP    Family History Family History  Problem Relation Age of Onset  . Cancer Sister 65  . COPD Sister   . Alcohol abuse Neg Hx   . Arthritis Neg Hx   . Asthma Neg Hx   . Birth defects Neg Hx   . Depression Neg Hx   . Diabetes Neg Hx   . Drug abuse Neg Hx   . Early death Neg Hx   . Hearing loss Neg Hx   . Heart disease Neg Hx   . Hyperlipidemia Neg Hx   . Hypertension Neg Hx   . Kidney disease Neg Hx   . Learning disabilities Neg Hx   . Mental illness Neg Hx   . Mental retardation Neg Hx   . Miscarriages / Stillbirths Neg Hx   . Stroke Neg Hx   . Vision loss Neg Hx   . Varicose  Veins Neg Hx     Social History Social History   Tobacco Use  . Smoking status: Former Smoker    Packs/day: 0.25    Types: Cigarettes    Start date: 05/15/1999    Last attempt to quit: 04/12/2014    Years since quitting: 3.9  . Smokeless tobacco: Never Used  Substance Use Topics  . Alcohol use: Yes    Alcohol/week: 0.0 oz    Comment: occassional  . Drug use: No    Types: Cocaine    Comment: Xanax     Allergies   Depakene [divalproex sodium]   Review of Systems Review of Systems  Eyes: Negative for visual disturbance.  Respiratory: Negative for shortness of breath.   Cardiovascular: Negative for chest pain and palpitations.  Gastrointestinal: Negative for abdominal pain and vomiting.  Genitourinary: Negative for dysuria and frequency.  Neurological: Positive for syncope and light-headedness. Negative for facial asymmetry, weakness and headaches.  All other systems reviewed and are negative.    Physical Exam Updated Vital Signs BP 104/71 (BP Location: Right Arm)   Pulse 77   Temp 97.8 F (36.6 C) (Oral)   Resp 16   Ht  (1.803 m)   Wt 54 kg (119 lb)   LMP 03/17/2018   SpO2 100%   BMI 16.60 kg/m   Physical Exam  Constitutional: She is oriented to person, place, and time. She appears well-developed and well-nourished. No distress.  HENT:  Head: Normocephalic and atraumatic.  Eyes: Pupils are equal, round, and reactive to light. Conjunctivae and EOM are normal.  Neck: Normal range of motion. Neck supple.  Cardiovascular: Normal rate, regular rhythm and intact distal pulses.  Pulmonary/Chest: Effort normal and breath sounds normal. No respiratory distress.  Abdominal: Soft. Bowel sounds are normal. She exhibits no distension. There is no tenderness.  Neurological: She is alert and oriented to person, place, and time.  Mental Status:  Alert, oriented, thought content appropriate, able to give a coherent history. Speech fluent without evidence of aphasia.  Able to follow 2 step commands without difficulty.  Cranial Nerves:  II:  Peripheral visual fields grossly normal, pupils equal, round, reactive to light III,IV, VI: ptosis not present, extra-ocular motions intact bilaterally  V,VII: smile symmetric, facial light touch sensation equal VIII: hearing grossly normal to voice  X: uvula elevates symmetrically  XI: bilateral shoulder shrug symmetric and strong XII: midline tongue  extension without fassiculations Motor:  Normal tone. 5/5 in upper and lower extremities bilaterally including strong and equal grip strength and dorsiflexion/plantar flexion Sensory: Pinprick and light touch normal in all extremities.  Deep Tendon Reflexes: 2+ and symmetric in the biceps and patella Cerebellar: normal finger-to-nose with bilateral upper extremities Gait: normal gait and balance CV: distal pulses palpable throughout    Skin: Skin is warm.  Psychiatric: She has a normal mood and affect. Her behavior is normal.  Nursing note and vitals reviewed.    ED Treatments / Results  Labs (all labs ordered are listed, but only abnormal results are displayed) Labs Reviewed  BASIC METABOLIC PANEL - Abnormal; Notable for the following components:      Result Value   BUN 5 (*)    Calcium 8.8 (*)    All other components within normal limits  CBC - Abnormal; Notable for the following components:   Hemoglobin 11.5 (*)    HCT 35.4 (*)    All other components within normal limits  URINALYSIS, ROUTINE W REFLEX MICROSCOPIC - Abnormal; Notable for the following components:   APPearance HAZY (*)    Hgb urine dipstick LARGE (*)    Protein, ur 100 (*)    Leukocytes, UA TRACE (*)    Bacteria, UA RARE (*)    All other components within normal limits  I-STAT BETA HCG BLOOD, ED ([facility], [facility], [facility] ONLY)    EKG EKG Interpretation  Date/Time:  Saturday Mar 17 2018 11:42:50 EDT Ventricular Rate:  73 PR Interval:  156 QRS Duration: 84 QT Interval:  412 QTC  Calculation: 453 R Axis:   78 Text Interpretation:  Normal sinus rhythm Cannot rule out Anterior infarct , age undetermined Abnormal ECG No significant change since last tracing Confirmed by Drema Pry [phone]) on 03/17/2018 1:23:33 PM   Radiology No results found.  Procedures Procedures (including critical care time)  Medications Ordered in ED Medications  sodium chloride 0.9 % bolus 1,000 mL (1,000 mLs Intravenous New Bag/Given 03/17/18 1322)     Initial Impression / Assessment and Plan / ED Course  I have reviewed the triage vital signs and the nursing notes.  Pertinent labs & imaging results that were available during my care of the patient were reviewed by me and considered in my medical decision making (see chart for details).     Patient presenting to the ED with syncopal episode, likely vasovagal. Pt endorses alcohol use last night, maybe be mildly dehydrated. No CP, palpitations or SOB. No head trauma. Normal neuro exam. Pt without arrhythmia or tachycardia while here in the department.  Labs reassuring. Normal ECG, and systolic blood pressure greater than 90; patient is low risk. IVf given. Will plan for discharge home with PCP follow-up.  Possibility of recurrent syncope has been discussed. Oral hydration encouraged. Strict return precautions, safe for discharge.  Discussed results, findings, treatment and follow up. Patient advised of return precautions. Patient verbalized understanding and agreed with plan.  Final Clinical Impressions(s) / ED Diagnoses   Final diagnoses:  Vasovagal syncope    ED Discharge Orders    None       Boneta Standre, [place] N, PA-C 03/17/18 1506    Nira Conn, MD 03/17/18 [phone]

## 2018-03-17 NOTE — ED Triage Notes (Signed)
Patient arrives with [facility]. Patient had syncopal episode while at bus stop. No fall or injury. Patient states she drank alcohol last night, states possibly related. Alert and oriented and in no apparent distress at this time.  18g saline lock in left AC, 400 mL NS given PTA by EMS.

## 2018-03-17 NOTE — ED Notes (Signed)
Patient able to ambulate independently  

## 2018-03-17 NOTE — ED Notes (Signed)
Got patient into a gown on the monitor and a warm blanket patient is resting with call bell in reach 

## 2018-03-17 NOTE — ED Triage Notes (Signed)
Pt arrived by [facility], had syncopal episode pta. Denies any complaints prior to episode. Has hx of same but no diagnosis made.

## 2018-04-24 ENCOUNTER — Encounter (HOSPITAL_COMMUNITY): Payer: Self-pay | Admitting: *Deleted

## 2018-04-24 ENCOUNTER — Inpatient Hospital Stay (HOSPITAL_COMMUNITY)
Admission: AD | Admit: 2018-04-24 | Discharge: 2018-04-24 | Disposition: A | Discharge status: Home / Self Care | Payer: Self-pay | Source: Ambulatory Visit | Attending: Family Medicine | Admitting: Family Medicine

## 2018-04-24 ENCOUNTER — Other Ambulatory Visit: Payer: Self-pay

## 2018-04-24 DIAGNOSIS — F17210 Nicotine dependence, cigarettes, uncomplicated: Secondary | ICD-10-CM | POA: Insufficient documentation

## 2018-04-24 DIAGNOSIS — F319 Bipolar disorder, unspecified: Secondary | ICD-10-CM | POA: Insufficient documentation

## 2018-04-24 DIAGNOSIS — F419 Anxiety disorder, unspecified: Secondary | ICD-10-CM | POA: Insufficient documentation

## 2018-04-24 DIAGNOSIS — R1031 Right lower quadrant pain: Secondary | ICD-10-CM | POA: Insufficient documentation

## 2018-04-24 DIAGNOSIS — Z79899 Other long term (current) drug therapy: Secondary | ICD-10-CM | POA: Insufficient documentation

## 2018-04-24 DIAGNOSIS — G4700 Insomnia, unspecified: Secondary | ICD-10-CM | POA: Insufficient documentation

## 2018-04-24 DIAGNOSIS — K219 Gastro-esophageal reflux disease without esophagitis: Secondary | ICD-10-CM | POA: Insufficient documentation

## 2018-04-24 DIAGNOSIS — N898 Other specified noninflammatory disorders of vagina: Secondary | ICD-10-CM | POA: Insufficient documentation

## 2018-04-24 LAB — URINALYSIS, ROUTINE W REFLEX MICROSCOPIC
BILIRUBIN URINE: NEGATIVE
Glucose, UA: NEGATIVE mg/dL
Hgb urine dipstick: NEGATIVE
Ketones, ur: NEGATIVE mg/dL
Nitrite: NEGATIVE
Protein, ur: NEGATIVE mg/dL
SPECIFIC GRAVITY, URINE: 1.026 (ref 1.005–1.030)
pH: 6 (ref 5.0–8.0)

## 2018-04-24 LAB — POCT PREGNANCY, URINE
PREG TEST UR: NEGATIVE
Preg Test, Ur: NEGATIVE

## 2018-04-24 NOTE — Discharge Instructions (Signed)
Ovarian Cyst An ovarian cyst is a fluid-filled sac on an ovary. The ovaries are organs that make eggs in women. Most ovarian cysts go away on their own and are not cancerous (are benign). Some cysts need treatment. Follow these instructions at home:  Take over-the-counter and prescription medicines only as told by your doctor.  Do not drive or use heavy machinery while taking prescription pain medicine.  Get pelvic exams and Pap tests as often as told by your doctor.  Return to your normal activities as told by your doctor. Ask your doctor what activities are safe for you.  Do not use any products that contain nicotine or tobacco, such as cigarettes and e-cigarettes. If you need help quitting, ask your doctor.  Keep all follow-up visits as told by your doctor. This is important. Contact a doctor if:  Your periods are: ? Late. ? Irregular. ? Painful.  Your periods stop.  You have pelvic pain that does not go away.  You have pressure on your bladder.  You have trouble making your bladder empty when you pee (urinate).  You have pain during sex.  You have any of the following in your belly (abdomen): ? A feeling of fullness. ? Pressure. ? Discomfort. ? Pain that does not go away. ? Swelling.  You feel sick most of the time.  You have trouble pooping (have constipation).  You are not as hungry as usual (you lose your appetite).  You get very bad acne.  You start to have more hair on your body and face.  You are gaining weight or losing weight without changing your exercise and eating habits.  You think you may be pregnant. Get help right away if:  You have belly pain that is very bad or gets worse.  You cannot eat or drink without throwing up (vomiting).  You suddenly get a fever.  Your period is a lot heavier than usual. This information is not intended to replace advice given to you by your health care provider. Make sure you discuss any questions you have  with your health care provider. Document Released: 03/28/2008 Document Revised: 04/29/2016 Document Reviewed: 03/13/2016 Elsevier Interactive Patient Education  2018 Elsevier Inc.  

## 2018-04-24 NOTE — MAU Note (Signed)
Has pain on her rt side.  Hx of ectopic preg x2, so anytime she has pain she gets worried.  Sharp pains off and on for about a wk, past 2 days have been worse.  Yesterday when she was at work, she got a little light headed.  Has some d/c. No bleeding. Neg UPT a few wks ago.

## 2018-04-24 NOTE — MAU Provider Note (Signed)
Patient Natalie Fischer is a 37 y.o.  [id number] Non-pregnant female here with complaints of on-going RLQ pain. She denies bleeding, abnormal discharge, dysuria, NV. She has a history of right-sided ovarian cysts; she thinks this is what the pain may be due to.  History     CSN: [id number]  Arrival date and time: 04/24/18 1816   None     Chief Complaint  Patient presents with  . Abdominal Pain  . Vaginal Discharge   Abdominal Pain  This is a chronic problem. The current episode started more than 1 month ago. The problem occurs intermittently. The pain is located in the RLQ. The pain is at a severity of 5/10. The quality of the pain is cramping. The abdominal pain does not radiate. Nothing aggravates the pain. The pain is relieved by nothing.  Vaginal Discharge  The patient's primary symptoms include vaginal discharge. Associated symptoms include abdominal pain.   Patient declines STI testing today. States she really just wanted to know if she was pregnant.  OB History    Gravida  2   Para      Term      Preterm      AB  2   Living  0     SAB      TAB      Ectopic  2   Multiple      Live Births              Past Medical History:  Diagnosis Date  . Anxiety   . Bipolar 1 disorder (HCC)   . Carpal tunnel syndrome of right wrist 2016  . Chlamydia   . Constipation   . Ectopic pregnancy 2008 and 2014   2008 required surgery, 2nd Misoprostol  . GERD (gastroesophageal reflux disease)   . Insomnia   . Trichomonas vaginitis   . UTI (lower urinary tract infection)     Past Surgical History:  Procedure Laterality Date  . ECTOPIC PREGNANCY SURGERY Right 2008    Family History  Problem Relation Age of Onset  . Cancer Sister 4046  . COPD Sister   . Alcohol abuse Neg Hx   . Arthritis Neg Hx   . Asthma Neg Hx   . Birth defects Neg Hx   . Depression Neg Hx   . Diabetes Neg Hx   . Drug abuse Neg Hx   . Early death Neg Hx   . Hearing loss Neg Hx   . Heart  disease Neg Hx   . Hyperlipidemia Neg Hx   . Hypertension Neg Hx   . Kidney disease Neg Hx   . Learning disabilities Neg Hx   . Mental illness Neg Hx   . Mental retardation Neg Hx   . Miscarriages / Stillbirths Neg Hx   . Stroke Neg Hx   . Vision loss Neg Hx   . Varicose Veins Neg Hx     Social History   Tobacco Use  . Smoking status: Current Every Day Smoker    Packs/day: 1.50    Start date: 05/15/1999  . Smokeless tobacco: Never Used  Substance Use Topics  . Alcohol use: Yes    Alcohol/week: 0.0 oz    Comment: occassional  . Drug use: No    Types: Cocaine    Comment: Last used June 2019    Allergies:  Allergies  Allergen Reactions  . Depakene [Divalproex Sodium] Other (See Comments)    "made me feel like I was going to  pass out"    Medications Prior to Admission  Medication Sig Dispense Refill Last Dose  . ARIPiprazole (ABILIFY) 15 MG tablet Take 1 tablet (15 mg total) by mouth at bedtime. For mood control (Patient not taking: Reported on 01/27/2018) 15 tablet 0 Not Taking at Unknown time  . Brexpiprazole (REXULTI PO) Take 1 tablet by mouth daily.   Past Month at Unknown time  . feeding supplement, ENSURE ENLIVE, (ENSURE ENLIVE) LIQD For nutritional supplement . 237 mL 12   . ibuprofen (ADVIL,MOTRIN) 200 MG tablet Take 800 mg by mouth every 6 (six) hours as needed for headache or moderate pain.   Past Week at Unknown time  . mirtazapine (REMERON) 30 MG tablet Take 1 tablet (30 mg total) by mouth at bedtime. For depression 30 tablet 0 01/26/2018 at Unknown time  . pantoprazole (PROTONIX) 20 MG tablet Take 1 tablet (20 mg total) by mouth daily. For acid reflux 15 tablet 0 Past Week at Unknown time    Review of Systems  Gastrointestinal: Positive for abdominal pain.  Genitourinary: Positive for vaginal discharge.  Neurological: Negative.   Psychiatric/Behavioral: Negative.    Physical Exam   Blood pressure 105/68, pulse 97, temperature 97.9 F (36.6 C), temperature  source Oral, resp. rate 17, weight 120 lb (54.4 kg), SpO2 99 %.  Physical Exam  Constitutional: She is oriented to person, place, and time. She appears well-developed.  HENT:  Head: Normocephalic.  Eyes: Pupils are equal, round, and reactive to light.  Neck: Normal range of motion.  GI: Soft.  Genitourinary:  Genitourinary Comments: Normal external female genitalia; no lesions. Slight tenderness in RLQ on exam but no rebound, distention. No CMT< suprapubic or adnexal tenderness.   Musculoskeletal: Normal range of motion.  Neurological: She is alert and oriented to person, place, and time.  Skin: Skin is warm and dry.  Psychiatric: She has a normal mood and affect.    MAU Course  Procedures  MDM -Patient does not want testing today for STIs, she is relieved to hear that she is not pregnant.  -patient does not have insurance right now; will order outpatient [place] and send message to clinic to call for an appointment.    Assessment and Plan   1. Right lower quadrant abdominal pain    2. Patient most likely has ovarian pain as it gets worse with her cycle. Recommended heat, ibuprofen for relief. Patient to return to [facility] ED if pain gets worse.   3. Patient stable for discharge with outpatient [place] ordered for two weeks from now and message sent to clinic to set up patient with gyn provider. Informed clinic that patient may need [facility].    Charlesetta Garibaldi Kooistra 04/24/2018, 8:12 PM

## 2018-05-01 ENCOUNTER — Encounter: Payer: Self-pay | Admitting: Obstetrics & Gynecology

## 2018-05-04 ENCOUNTER — Telehealth: Payer: Self-pay | Admitting: General Practice

## 2018-05-04 NOTE — Telephone Encounter (Addendum)
-----   Message from Vivien Rotaheryl A Clinton sent at 05/01/2018  5:37 PM EDT ----- Patient has her MD appointment. Can someone schedule her ultrasound to make sure she has it before this appointment on 08/12.  Thanks ----- Message ----- From: Marylene LandKooistra, Kathryn Lorraine, CNM Sent: 04/24/2018   9:42 PM To: [facility] Admin Pool  [facility] this patient needs an appt for gyn with an MD and also an US.   I'm trying to help her faciliate an appt with us.   She needs [facility] care--should she have her US first or her MD appointment first and then come back to talk about her US? Let me know,   Thank you so much,  [facility]! Samara DeistKathryn

## 2018-05-04 NOTE — Telephone Encounter (Signed)
Scheduled ultrasound 7/19 @ 1pm. Called & informed patient of appt. Patient verbalized understanding & had no questions.

## 2018-05-11 ENCOUNTER — Ambulatory Visit (HOSPITAL_COMMUNITY)
Admission: RE | Admit: 2018-05-11 | Discharge: 2018-05-11 | Disposition: A | Discharge status: Home / Self Care | Payer: Self-pay | Source: Ambulatory Visit | Attending: Student | Admitting: Student

## 2018-05-11 ENCOUNTER — Encounter (HOSPITAL_COMMUNITY): Payer: Self-pay

## 2018-05-11 DIAGNOSIS — R1031 Right lower quadrant pain: Secondary | ICD-10-CM | POA: Insufficient documentation

## 2018-06-04 ENCOUNTER — Encounter: Payer: Self-pay | Admitting: Obstetrics & Gynecology

## 2019-01-02 ENCOUNTER — Other Ambulatory Visit: Payer: Self-pay

## 2019-01-02 ENCOUNTER — Encounter (HOSPITAL_COMMUNITY): Payer: Self-pay

## 2019-01-02 ENCOUNTER — Emergency Department (HOSPITAL_COMMUNITY)
Admission: EM | Admit: 2019-01-02 | Discharge: 2019-01-02 | Disposition: A | Discharge status: Home / Self Care | Payer: Medicaid Other | Attending: Emergency Medicine | Admitting: Emergency Medicine

## 2019-01-02 DIAGNOSIS — N760 Acute vaginitis: Secondary | ICD-10-CM | POA: Insufficient documentation

## 2019-01-02 DIAGNOSIS — A599 Trichomoniasis, unspecified: Secondary | ICD-10-CM | POA: Insufficient documentation

## 2019-01-02 DIAGNOSIS — Z79899 Other long term (current) drug therapy: Secondary | ICD-10-CM | POA: Insufficient documentation

## 2019-01-02 DIAGNOSIS — F17200 Nicotine dependence, unspecified, uncomplicated: Secondary | ICD-10-CM | POA: Insufficient documentation

## 2019-01-02 DIAGNOSIS — B9689 Other specified bacterial agents as the cause of diseases classified elsewhere: Secondary | ICD-10-CM

## 2019-01-02 LAB — URINALYSIS, ROUTINE W REFLEX MICROSCOPIC
Bilirubin Urine: NEGATIVE
Glucose, UA: NEGATIVE mg/dL
Hgb urine dipstick: NEGATIVE
Ketones, ur: NEGATIVE mg/dL
Nitrite: NEGATIVE
Protein, ur: NEGATIVE mg/dL
Specific Gravity, Urine: 1.012 (ref 1.005–1.030)
pH: 6 (ref 5.0–8.0)

## 2019-01-02 LAB — WET PREP, GENITAL
Sperm: NONE SEEN
Yeast Wet Prep HPF POC: NONE SEEN

## 2019-01-02 LAB — PREGNANCY, URINE: Preg Test, Ur: NEGATIVE

## 2019-01-02 MED ORDER — METRONIDAZOLE 500 MG PO TABS: 500.0000 mg | ORAL_TABLET | Freq: Two times a day (BID) | ORAL | 0 refills | Status: DC

## 2019-01-02 MED ORDER — LIDOCAINE HCL 1 % IJ SOLN
INTRAMUSCULAR | Status: AC
  Filled 2019-01-02: qty 20

## 2019-01-02 MED ORDER — CEFTRIAXONE SODIUM 250 MG IJ SOLR
250.0000 mg | Freq: Once | INTRAMUSCULAR | Status: AC
  Administered 2019-01-02: 250 mg via INTRAMUSCULAR
  Filled 2019-01-02: qty 250

## 2019-01-02 MED ORDER — AZITHROMYCIN 250 MG PO TABS
1000.0000 mg | ORAL_TABLET | Freq: Once | ORAL | Status: AC
  Administered 2019-01-02: 1000 mg via ORAL
  Filled 2019-01-02: qty 4

## 2019-01-02 NOTE — Discharge Instructions (Addendum)
The wet prep was positive for trichomonas and bacterial vaginosis.  Do not drink alcohol while taking metronidazole.  You have been tested for other STDs. Some of these results are still pending. Any abnormalities will be called to you. You have been empirically treated for gonorrhea and chlamydia. This does not mean you necessarily have these diseases, treatment is precautionary. Be sure to follow safe sex practices, including monogamy and/or condom use. All sexual partners must also be notified and treated.  Any future STD testing or treatment should be performed at the [facility] department or primary care office. For the treatment to be fully effective, avoid all sexual contact for at least 2 weeks after medication administration. Otherwise, you will infect your partner(s) and/or risk reinfecting yourself. Should symptoms fail to improve within 1 week, follow up with a primary care provider, OBGYN, or go to the [facility].

## 2019-01-02 NOTE — ED Triage Notes (Signed)
Pt states she is having discharge with a faint fishy odor x1 week. Pt states that it is white. Pt also c/o pain on her right side. Denies any new partners.

## 2019-01-02 NOTE — ED Provider Notes (Signed)
[facility]-EMERGENCY DEPT Provider Note   CSN: [id number] Arrival date & time: 01/02/19  1000    History   Chief Complaint Chief Complaint  Patient presents with  . Vaginal Discharge    HPI Natalie Fischer is a 38 y.o. female.     HPI  Natalie Fischer is a 38 y.o. female, with a history of bipolar, ectopic pregnancy, GERD, and trichomonas, presenting to the ED with vaginal discharge beginning about a week ago. Mild, "nagging," 3/10, suprapubic irritation, nonradiating.  Last sexually active about 2 months ago, female sexual partners. LMP Dec 14, 2018.  Denies fever/chills, N/V/D, abnormal vaginal bleeding, dysuria, hematuria, or any other complaints.    Past Medical History:  Diagnosis Date  . Anxiety   . Bipolar 1 disorder (HCC)   . Carpal tunnel syndrome of right wrist 2016  . Chlamydia   . Constipation   . Ectopic pregnancy 2008 and 2014   2008 required surgery, 2nd Misoprostol  . GERD (gastroesophageal reflux disease)   . Insomnia   . Trichomonas vaginitis   . UTI (lower urinary tract infection)     Patient Active Problem List   Diagnosis Date Noted  . Polysubstance (excluding opioids) dependence, daily use (HCC) 08/08/2017  . Bipolar 1 disorder, depressed, severe (HCC) 08/07/2017  . Mild benzodiazepine use disorder (HCC) 01/23/2016  . Cocaine abuse (HCC) 01/22/2016  . Bipolar 1 disorder, depressed, moderate (HCC) 02/20/2015  . Cocaine use disorder, severe, dependence (HCC)   . Cannabis use disorder, moderate, dependence (HCC)   . ANEMIA 05/11/2010  . LOSS OF WEIGHT 03/07/2008  . ALLERGIC RHINITIS 01/18/2008  . INSOMNIA 01/18/2008  . ANXIETY 08/31/2007  . GERD 06/18/2007  . CONSTIPATION 06/18/2007    Past Surgical History:  Procedure Laterality Date  . ECTOPIC PREGNANCY SURGERY Right 2008     OB History    Gravida  2   Para      Term      Preterm      AB  2   Living  0     SAB      TAB      Ectopic  2   Multiple      Live Births               Home Medications    Prior to Admission medications   Medication Sig Start Date End Date Taking? Authorizing Provider  ARIPiprazole (ABILIFY) 15 MG tablet Take 1 tablet (15 mg total) by mouth at bedtime. For mood control Patient taking differently: Take 15 mg by mouth daily. For mood control 08/10/17  Yes Armandina Stammer I, NP  hydrOXYzine (ATARAX/VISTARIL) 50 MG tablet Take 50 mg by mouth 3 (three) times daily.   Yes [provider]  ibuprofen (ADVIL,MOTRIN) 200 MG tablet Take 800 mg by mouth every 6 (six) hours as needed for headache or moderate pain.   Yes [provider]  mirtazapine (REMERON) 30 MG tablet Take 1 tablet (30 mg total) by mouth at bedtime. For depression 08/10/17  Yes Nwoko, Nicole Kindred I, NP  metroNIDAZOLE (FLAGYL) 500 MG tablet Take 1 tablet (500 mg total) by mouth 2 (two) times daily. 01/02/19   Randon Somera C, PA-C  pantoprazole (PROTONIX) 20 MG tablet Take 1 tablet (20 mg total) by mouth daily. For acid reflux Patient not taking: Reported on 01/02/2019 08/10/17   Sanjuana Kava, NP    Family History Family History  Problem Relation Age of Onset  . Cancer  Sister 52  . COPD Sister   . Alcohol abuse Neg Hx   . Arthritis Neg Hx   . Asthma Neg Hx   . Birth defects Neg Hx   . Depression Neg Hx   . Diabetes Neg Hx   . Drug abuse Neg Hx   . Early death Neg Hx   . Hearing loss Neg Hx   . Heart disease Neg Hx   . Hyperlipidemia Neg Hx   . Hypertension Neg Hx   . Kidney disease Neg Hx   . Learning disabilities Neg Hx   . Mental illness Neg Hx   . Mental retardation Neg Hx   . Miscarriages / Stillbirths Neg Hx   . Stroke Neg Hx   . Vision loss Neg Hx   . Varicose Veins Neg Hx     Social History Social History   Tobacco Use  . Smoking status: Current Every Day Smoker    Packs/day: 1.00    Start date: 05/15/1999  . Smokeless tobacco: Never Used  Substance Use Topics  . Alcohol use: Yes    Alcohol/week:  0.0 standard drinks    Comment: occassional  . Drug use: No    Types: Cocaine    Comment: Last used June 2019     Allergies   Depakene [divalproex sodium]   Review of Systems Review of Systems  Constitutional: Negative for chills, diaphoresis and fever.  Respiratory: Negative for shortness of breath.   Cardiovascular: Negative for chest pain.  Gastrointestinal: Positive for abdominal pain. Negative for diarrhea, nausea and vomiting.  Genitourinary: Positive for vaginal discharge. Negative for dysuria, flank pain, frequency, hematuria, urgency and vaginal bleeding.  Neurological: Negative for syncope, weakness and numbness.  All other systems reviewed and are negative.    Physical Exam Updated Vital Signs BP 124/88 (BP Location: Left Arm)   Pulse 85   Temp 98.1 F (36.7 C) (Oral)   Resp 16   Ht 5\' 11"  (1.803 m)   Wt 54.4 kg   LMP 12/14/2018   SpO2 100%   BMI 16.74 kg/m   Physical Exam Vitals signs and nursing note reviewed.  Constitutional:      General: She is not in acute distress.    Appearance: She is well-developed. She is not diaphoretic.  HENT:     Head: Normocephalic and atraumatic.     Mouth/Throat:     Mouth: Mucous membranes are moist.     Pharynx: Oropharynx is clear.  Eyes:     Conjunctiva/sclera: Conjunctivae normal.  Neck:     Musculoskeletal: Neck supple.  Cardiovascular:     Rate and Rhythm: Normal rate and regular rhythm.     Pulses: Normal pulses.  Pulmonary:     Effort: Pulmonary effort is normal. No respiratory distress.  Abdominal:     Palpations: Abdomen is soft.     Tenderness: There is no abdominal tenderness. There is no guarding.  Genitourinary:    Vagina: Vaginal discharge present.     Comments: External genitalia normal Vagina with discharge - thick, yellow-tinged discharge seemingly coming from cervical os. Cervix  normal negative for cervical motion tenderness Adnexa palpated, no masses, negative for tenderness noted  Bladder palpated negative for tenderness Uterus palpated no masses, negative for tenderness  No inguinal lymphadenopathy. Otherwise normal female genitalia. Med Tech  served as chaperone during exam. Musculoskeletal:     Right lower leg: No edema.     Left lower leg: No edema.  Lymphadenopathy:  Cervical: No cervical adenopathy.  Skin:    General: Skin is warm and dry.  Neurological:     Mental Status: She is alert.  Psychiatric:        Mood and Affect: Mood and affect normal.        Speech: Speech normal.        Behavior: Behavior normal.      ED Treatments / Results  Labs (all labs ordered are listed, but only abnormal results are displayed) Labs Reviewed  WET PREP, GENITAL - Abnormal; Notable for the following components:      Result Value   Trich, Wet Prep PRESENT (*)    Clue Cells Wet Prep HPF POC PRESENT (*)    WBC, Wet Prep HPF POC MANY (*)    All other components within normal limits  URINALYSIS, ROUTINE W REFLEX MICROSCOPIC - Abnormal; Notable for the following components:   Leukocytes,Ua SMALL (*)    Bacteria, UA FEW (*)    All other components within normal limits  PREGNANCY, URINE  RPR  HIV ANTIBODY (ROUTINE TESTING W REFLEX)  GC/CHLAMYDIA PROBE AMP ([place]) NOT AT [facility]    EKG None  Radiology No results found.  Procedures Procedures (including critical care time)  Medications Ordered in ED Medications  lidocaine (XYLOCAINE) 1 % (with pres) injection (has no administration in time range)  cefTRIAXone (ROCEPHIN) injection 250 mg (250 mg Intramuscular Given 01/02/19 1327)  azithromycin (ZITHROMAX) tablet 1,000 mg (1,000 mg Oral Given 01/02/19 1325)     Initial Impression / Assessment and Plan / ED Course  I have reviewed the triage vital signs and the nursing notes.  Pertinent labs & imaging results that were available during my care of the patient were reviewed by me and considered in my medical decision making (see chart for details).         Patient presents with abnormal vaginal discharge. Patient is nontoxic appearing, afebrile, not tachycardic, not tachypneic, not hypotensive, maintains excellent SPO2 on room air, and is in no apparent distress.  Abdominal exam benign.  Wet prep positive for trichomonas and evidence of bacterial vaginosis.  Preemptively treated for gonorrhea and chlamydia as well. The patient was given instructions for home care as well as return precautions. Patient voices understanding of these instructions, accepts the plan, and is comfortable with discharge.    Final Clinical Impressions(s) / ED Diagnoses   Final diagnoses:  BV (bacterial vaginosis)  Trichomonas infection    ED Discharge Orders         Ordered    metroNIDAZOLE (FLAGYL) 500 MG tablet  2 times daily     01/02/19 1342           Anselm Pancoast, PA-C 01/02/19 1448    Terrilee Files, MD 01/02/19 1820

## 2019-01-03 LAB — HIV ANTIBODY (ROUTINE TESTING W REFLEX): HIV Screen 4th Generation wRfx: NONREACTIVE

## 2019-01-03 LAB — GC/CHLAMYDIA PROBE AMP (~~LOC~~) NOT AT ARMC
CHLAMYDIA, DNA PROBE: NEGATIVE
Neisseria Gonorrhea: NEGATIVE

## 2019-01-03 LAB — RPR: RPR Ser Ql: NONREACTIVE

## 2019-08-28 ENCOUNTER — Ambulatory Visit (HOSPITAL_COMMUNITY)
Admission: EM | Admit: 2019-08-28 | Discharge: 2019-08-28 | Disposition: A | Discharge status: Home / Self Care | Payer: Medicaid Other | Attending: Family Medicine | Admitting: Family Medicine

## 2019-08-28 ENCOUNTER — Other Ambulatory Visit: Payer: Self-pay

## 2019-08-28 ENCOUNTER — Encounter (HOSPITAL_COMMUNITY): Payer: Self-pay

## 2019-08-28 DIAGNOSIS — N3001 Acute cystitis with hematuria: Secondary | ICD-10-CM

## 2019-08-28 DIAGNOSIS — B9689 Other specified bacterial agents as the cause of diseases classified elsewhere: Secondary | ICD-10-CM

## 2019-08-28 LAB — POCT URINALYSIS DIP (DEVICE)
Bilirubin Urine: NEGATIVE
Glucose, UA: NEGATIVE mg/dL
Nitrite: NEGATIVE
Protein, ur: 100 mg/dL — AB
Specific Gravity, Urine: 1.025 (ref 1.005–1.030)
Urobilinogen, UA: 2 mg/dL — ABNORMAL HIGH (ref 0.0–1.0)
pH: 6.5 (ref 5.0–8.0)

## 2019-08-28 MED ORDER — NITROFURANTOIN MONOHYD MACRO 100 MG PO CAPS: 100.0000 mg | ORAL_CAPSULE | Freq: Two times a day (BID) | ORAL | 0 refills | Status: DC

## 2019-08-28 NOTE — ED Triage Notes (Signed)
Pt states she has painful urination x 4 days. Pt states she has a pressure while voiding. Pt states she needs a work note.

## 2019-08-28 NOTE — ED Provider Notes (Signed)
[facility]    CSN: [id number] Arrival date & time: 08/28/19  1050      History   Chief Complaint Chief Complaint  Patient presents with  . painful urination    HPI ALDONIA Fischer is a 38 y.o. female.   Patient has a history of dysuria.  Typically when she gets symptoms like this she drinks a lot of water and cranberry juice and symptoms resolve.  Historically she has never been diagnosed with a UTI.  She denies fever chills back pain.  In addition to the dysuria she also has some urgency and feelings of incomplete emptying of her bladder.  HPI  Past Medical History:  Diagnosis Date  . Anxiety   . Bipolar 1 disorder ([place])   . Carpal tunnel syndrome of right wrist 2016  . Chlamydia   . Constipation   . Ectopic pregnancy 2008 and 2014   2008 required surgery, 2nd Misoprostol  . GERD (gastroesophageal reflux disease)   . Insomnia   . Trichomonas vaginitis   . UTI (lower urinary tract infection)     Patient Active Problem List   Diagnosis Date Noted  . Polysubstance (excluding opioids) dependence, daily use ([place]) 08/08/2017  . Bipolar 1 disorder, depressed, severe ([place]) 08/07/2017  . Mild benzodiazepine use disorder ([place]) 01/23/2016  . Cocaine abuse ([place]) 01/22/2016  . Bipolar 1 disorder, depressed, moderate ([place]) 02/20/2015  . Cocaine use disorder, severe, dependence ([place])   . Cannabis use disorder, moderate, dependence ([place])   . ANEMIA 05/11/2010  . LOSS OF WEIGHT 03/07/2008  . ALLERGIC RHINITIS 01/18/2008  . INSOMNIA 01/18/2008  . ANXIETY 08/31/2007  . GERD 06/18/2007  . CONSTIPATION 06/18/2007    Past Surgical History:  Procedure Laterality Date  . ECTOPIC PREGNANCY SURGERY Right 2008    OB History    Gravida  2   Para      Term      Preterm      AB  2   Living  0     SAB      TAB      Ectopic  2   Multiple      Live Births               Home Medications    Prior to Admission medications   Medication Sig Start  Date End Date Taking? Authorizing Provider  ARIPiprazole (ABILIFY) 15 MG tablet Take 1 tablet (15 mg total) by mouth at bedtime. For mood control Patient taking differently: Take 15 mg by mouth daily. For mood control 08/10/17   Lindell Spar I, NP  hydrOXYzine (ATARAX/VISTARIL) 50 MG tablet Take 50 mg by mouth 3 (three) times daily.    [provider]  ibuprofen (ADVIL,MOTRIN) 200 MG tablet Take 800 mg by mouth every 6 (six) hours as needed for headache or moderate pain.    [provider]  metroNIDAZOLE (FLAGYL) 500 MG tablet Take 1 tablet (500 mg total) by mouth 2 (two) times daily. 01/02/19   Joy, Shawn C, PA-C  mirtazapine (REMERON) 30 MG tablet Take 1 tablet (30 mg total) by mouth at bedtime. For depression 08/10/17   Lindell Spar I, NP  pantoprazole (PROTONIX) 20 MG tablet Take 1 tablet (20 mg total) by mouth daily. For acid reflux Patient not taking: Reported on 01/02/2019 08/10/17   Encarnacion Slates, NP    Family History Family History  Problem Relation Age of Onset  . Cancer Sister 39  . COPD Sister   .  Alcohol abuse Neg Hx   . Arthritis Neg Hx   . Asthma Neg Hx   . Birth defects Neg Hx   . Depression Neg Hx   . Diabetes Neg Hx   . Drug abuse Neg Hx   . Early death Neg Hx   . Hearing loss Neg Hx   . Heart disease Neg Hx   . Hyperlipidemia Neg Hx   . Hypertension Neg Hx   . Kidney disease Neg Hx   . Learning disabilities Neg Hx   . Mental illness Neg Hx   . Mental retardation Neg Hx   . Miscarriages / Stillbirths Neg Hx   . Stroke Neg Hx   . Vision loss Neg Hx   . Varicose Veins Neg Hx     Social History Social History   Tobacco Use  . Smoking status: Current Every Day Smoker    Packs/day: 1.00    Start date: 05/15/1999  . Smokeless tobacco: Never Used  Substance Use Topics  . Alcohol use: Yes    Alcohol/week: 0.0 standard drinks    Comment: occassional  . Drug use: No    Types: Cocaine    Comment: Last used June 2019     Allergies    Depakene [divalproex sodium]   Review of Systems Review of Systems  Genitourinary: Positive for dysuria and urgency.  All other systems reviewed and are negative.    Physical Exam Triage Vital Signs ED Triage Vitals  Enc Vitals Group     BP 08/28/19 1112 113/73     Pulse Rate 08/28/19 1112 92     Resp --      Temp 08/28/19 1112 98.4 F (36.9 C)     Temp Source 08/28/19 1112 Oral     SpO2 08/28/19 1112 100 %     Weight 08/28/19 1111 125 lb (56.7 kg)     Height --      Head Circumference --      Peak Flow --      Pain Score 08/28/19 1111 9     Pain Loc --      Pain Edu? --      Excl. in GC? --    No data found.  Updated Vital Signs BP 113/73 (BP Location: Right Arm)   Pulse 92   Temp 98.4 F (36.9 C) (Oral)   Wt 56.7 kg   LMP 08/24/2019   SpO2 100%   BMI 17.43 kg/m   Visual Acuity Right Eye Distance:   Left Eye Distance:   Bilateral Distance:    Right Eye Near:   Left Eye Near:    Bilateral Near:     Physical Exam Vitals signs reviewed.  Constitutional:      Appearance: Normal appearance.  Abdominal:     Comments: Abdomen is soft but there is suprapubic tenderness with palpation There is no CVA tenderness  Neurological:     Mental Status: She is alert.      UC Treatments / Results  Labs (all labs ordered are listed, but only abnormal results are displayed) Labs Reviewed - No data to display  EKG   Radiology No results found.  Procedures Procedures (including critical care time)  Medications Ordered in UC Medications - No data to display  Initial Impression / Assessment and Plan / UC Course  I have reviewed the triage vital signs and the nursing notes.  Pertinent labs & imaging results that were available during my care of the patient were reviewed  by me and considered in my medical decision making (see chart for details).     Urinary tract infection.  Will order culture and begin antibiotic pending results of culture Final  Clinical Impressions(s) / UC Diagnoses   Final diagnoses:  None   Discharge Instructions   None    ED Prescriptions    None     PDMP not reviewed this encounter.   Frederica Kuster, MD 08/28/19 1134

## 2019-08-30 LAB — URINE CULTURE: Culture: 40000 — AB

## 2019-09-02 ENCOUNTER — Telehealth: Payer: Self-pay | Admitting: Emergency Medicine

## 2019-09-02 NOTE — Telephone Encounter (Signed)
Urine culture was positive for e coli and strep agalactiae and was given  macrobid at urgent care visit. Pt contacted and made aware, educated on completing antibiotic and to follow up if symptoms are persistent. Verbalized understanding.

## 2019-09-06 ENCOUNTER — Encounter (HOSPITAL_COMMUNITY): Payer: Self-pay

## 2019-09-06 ENCOUNTER — Other Ambulatory Visit: Payer: Self-pay

## 2019-09-06 ENCOUNTER — Ambulatory Visit (HOSPITAL_COMMUNITY)
Admission: EM | Admit: 2019-09-06 | Discharge: 2019-09-06 | Disposition: A | Discharge status: Home / Self Care | Payer: Medicaid Other | Attending: Family Medicine | Admitting: Family Medicine

## 2019-09-06 DIAGNOSIS — N3001 Acute cystitis with hematuria: Secondary | ICD-10-CM | POA: Insufficient documentation

## 2019-09-06 LAB — POCT URINALYSIS DIP (DEVICE)
Bilirubin Urine: NEGATIVE
Glucose, UA: NEGATIVE mg/dL
Ketones, ur: NEGATIVE mg/dL
Nitrite: POSITIVE — AB
Protein, ur: 100 mg/dL — AB
Specific Gravity, Urine: 1.02 (ref 1.005–1.030)
Urobilinogen, UA: 1 mg/dL (ref 0.0–1.0)
pH: 7 (ref 5.0–8.0)

## 2019-09-06 MED ORDER — CIPROFLOXACIN HCL 500 MG PO TABS: 500.0000 mg | ORAL_TABLET | Freq: Two times a day (BID) | ORAL | 0 refills | Status: AC

## 2019-09-06 MED ORDER — CEFTRIAXONE SODIUM 1 G IJ SOLR
INTRAMUSCULAR | Status: AC
  Filled 2019-09-06: qty 10

## 2019-09-06 MED ORDER — KETOROLAC TROMETHAMINE 30 MG/ML IJ SOLN
30.0000 mg | Freq: Once | INTRAMUSCULAR | Status: AC
  Administered 2019-09-06: 30 mg via INTRAMUSCULAR

## 2019-09-06 MED ORDER — KETOROLAC TROMETHAMINE 30 MG/ML IJ SOLN
INTRAMUSCULAR | Status: AC
  Filled 2019-09-06: qty 1

## 2019-09-06 MED ORDER — PHENAZOPYRIDINE HCL 200 MG PO TABS: 200.0000 mg | ORAL_TABLET | Freq: Three times a day (TID) | ORAL | 0 refills | Status: DC

## 2019-09-06 MED ORDER — CEFTRIAXONE SODIUM 1 G IJ SOLR
1.0000 g | Freq: Once | INTRAMUSCULAR | Status: AC
  Administered 2019-09-06: 1 g via INTRAMUSCULAR

## 2019-09-06 NOTE — ED Triage Notes (Signed)
Pt presents with continued urinary tract infection after having a round of antibiotics; pt states she is still having severe pelvic pain and urinary frequency and urgency.

## 2019-09-06 NOTE — ED Provider Notes (Signed)
[facility]    CSN: [id number] Arrival date & time: 09/06/19  1014      History   Chief Complaint Chief Complaint  Patient presents with  . Urinary Tract Infection    HPI [name] is a 38 y.o. female.   Patient is a 38 year old female with past medical history of anxiety, bipolar, chlamydia, constipation, GERD, ectopic pregnancy, insomnia, trichomonas, UTI. She presents today with continued dysuria, urinary frequency, suprapubic discomfort. She was seen here on the 4th and treated for UTI with Macrobid and symptoms never improved. No fever, chills, body aches, nausea, vomiting.  Denies any vaginal discharge, itching or irritation.  Denies any concern for STDs. Patient's last menstrual period was 08/24/2019.  ROS per HPI      Past Medical History:  Diagnosis Date  . Anxiety   . Bipolar 1 disorder (HCC)   . Carpal tunnel syndrome of right wrist 2016  . Chlamydia   . Constipation   . Ectopic pregnancy 2008 and 2014   2008 required surgery, 2nd Misoprostol  . GERD (gastroesophageal reflux disease)   . Insomnia   . Trichomonas vaginitis   . UTI (lower urinary tract infection)     Patient Active Problem List   Diagnosis Date Noted  . Polysubstance (excluding opioids) dependence, daily use (HCC) 08/08/2017  . Bipolar 1 disorder, depressed, severe (HCC) 08/07/2017  . Mild benzodiazepine use disorder (HCC) 01/23/2016  . Cocaine abuse (HCC) 01/22/2016  . Bipolar 1 disorder, depressed, moderate (HCC) 02/20/2015  . Cocaine use disorder, severe, dependence (HCC)   . Cannabis use disorder, moderate, dependence (HCC)   . ANEMIA 05/11/2010  . LOSS OF WEIGHT 03/07/2008  . ALLERGIC RHINITIS 01/18/2008  . INSOMNIA 01/18/2008  . ANXIETY 08/31/2007  . GERD 06/18/2007  . CONSTIPATION 06/18/2007    Past Surgical History:  Procedure Laterality Date  . ECTOPIC PREGNANCY SURGERY Right 2008    OB History    Gravida  2   Para      Term      Preterm     AB  2   Living  0     SAB      TAB      Ectopic  2   Multiple      Live Births               Home Medications    Prior to Admission medications   Medication Sig Start Date End Date Taking? Authorizing Provider  ARIPiprazole (ABILIFY) 15 MG tablet Take 1 tablet (15 mg total) by mouth at bedtime. For mood control Patient taking differently: Take 15 mg by mouth daily. For mood control 08/10/17   Natalie Fischer, Agnes I, NP  ciprofloxacin (CIPRO) 500 MG tablet Take 1 tablet (500 mg total) by mouth every 12 (twelve) hours for 5 days. 09/06/19 09/11/19  Dahlia ByesBast, Talita Recht A, NP  hydrOXYzine (ATARAX/VISTARIL) 50 MG tablet Take 50 mg by mouth 3 (three) times daily.    [provider]  ibuprofen (ADVIL,MOTRIN) 200 MG tablet Take 800 mg by mouth every 6 (six) hours as needed for headache or moderate pain.    [provider]  mirtazapine (REMERON) 30 MG tablet Take 1 tablet (30 mg total) by mouth at bedtime. For depression 08/10/17   Natalie Fischer, Agnes I, NP  phenazopyridine (PYRIDIUM) 200 MG tablet Take 1 tablet (200 mg total) by mouth 3 (three) times daily. 09/06/19   Jamaiyah Pyle, Gloris Manchesterraci A, NP  pantoprazole (PROTONIX) 20 MG tablet Take 1 tablet (  20 mg total) by mouth daily. For acid reflux Patient not taking: Reported on 01/02/2019 08/10/17 09/06/19  Sanjuana Kava, NP    Family History Family History  Problem Relation Age of Onset  . Cancer Sister 17  . COPD Sister   . Alcohol abuse Neg Hx   . Arthritis Neg Hx   . Asthma Neg Hx   . Birth defects Neg Hx   . Depression Neg Hx   . Diabetes Neg Hx   . Drug abuse Neg Hx   . Early death Neg Hx   . Hearing loss Neg Hx   . Heart disease Neg Hx   . Hyperlipidemia Neg Hx   . Hypertension Neg Hx   . Kidney disease Neg Hx   . Learning disabilities Neg Hx   . Mental illness Neg Hx   . Mental retardation Neg Hx   . Miscarriages / Stillbirths Neg Hx   . Stroke Neg Hx   . Vision loss Neg Hx   . Varicose Veins Neg Hx     Social History  Social History   Tobacco Use  . Smoking status: Current Every Day Smoker    Packs/day: 1.00    Start date: 05/15/1999  . Smokeless tobacco: Never Used  Substance Use Topics  . Alcohol use: Yes    Alcohol/week: 0.0 standard drinks    Comment: occassional  . Drug use: No    Types: Cocaine    Comment: Last used June 2019     Allergies   Depakene [divalproex sodium]   Review of Systems Review of Systems   Physical Exam Triage Vital Signs ED Triage Vitals  Enc Vitals Group     BP 09/06/19 1131 114/74     Pulse Rate 09/06/19 1131 88     Resp 09/06/19 1131 18     Temp 09/06/19 1131 98.4 F (36.9 C)     Temp Source 09/06/19 1131 Oral     SpO2 09/06/19 1131 100 %     Weight --      Height --      Head Circumference --      Peak Flow --      Pain Score 09/06/19 1133 9     Pain Loc --      Pain Edu? --      Excl. in GC? --    No data found.  Updated Vital Signs BP 114/74 (BP Location: Right Arm)   Pulse 88   Temp 98.4 F (36.9 C) (Oral)   Resp 18   LMP 08/24/2019   SpO2 100%   Visual Acuity Right Eye Distance:   Left Eye Distance:   Bilateral Distance:    Right Eye Near:   Left Eye Near:    Bilateral Near:     Physical Exam Vitals signs and nursing note reviewed.  Constitutional:      General: She is not in acute distress.    Appearance: Normal appearance. She is not ill-appearing, toxic-appearing or diaphoretic.  HENT:     Head: Normocephalic.     Nose: Nose normal.     Mouth/Throat:     Pharynx: Oropharynx is clear.  Eyes:     Conjunctiva/sclera: Conjunctivae normal.  Neck:     Musculoskeletal: Normal range of motion.  Pulmonary:     Effort: Pulmonary effort is normal.  Abdominal:     General: Abdomen is flat.     Palpations: Abdomen is soft.     Tenderness: There is abdominal  tenderness in the suprapubic area. There is no right CVA tenderness or left CVA tenderness.  Musculoskeletal: Normal range of motion.  Skin:    General: Skin is warm  and dry.     Findings: No rash.  Neurological:     Mental Status: She is alert.  Psychiatric:        Mood and Affect: Mood normal.      UC Treatments / Results  Labs (all labs ordered are listed, but only abnormal results are displayed) Labs Reviewed  POCT URINALYSIS DIP (DEVICE) - Abnormal; Notable for the following components:      Result Value   Hgb urine dipstick SMALL (*)    Protein, ur 100 (*)    Nitrite POSITIVE (*)    Leukocytes,Ua MODERATE (*)    All other components within normal limits  URINE CULTURE    EKG   Radiology No results found.  Procedures Procedures (including critical care time)  Medications Ordered in UC Medications  ketorolac (TORADOL) 30 MG/ML injection 30 mg (30 mg Intramuscular Given 09/06/19 1218)  cefTRIAXone (ROCEPHIN) injection 1 g (1 g Intramuscular Given 09/06/19 1218)  ketorolac (TORADOL) 30 MG/ML injection (has no administration in time range)  cefTRIAXone (ROCEPHIN) 1 g injection (has no administration in time range)    Initial Impression / Assessment and Plan / UC Course  I have reviewed the triage vital signs and the nursing notes.  Pertinent labs & imaging results that were available during my care of the patient were reviewed by me and considered in my medical decision making (see chart for details).     Treating patient for urinary tract infection.  Patient never had resolved symptoms from previous treatment.  Symptoms have worsened. Not concerned for pyelonephritis at this time Based on previous culture we will treat with a gram of Rocephin here in clinic and Cipro x5 days due to resistance  Recommended push fluids and avoid caffeine. Follow up as needed for continued or worsening symptoms  Final Clinical Impressions(s) / UC Diagnoses   Final diagnoses:  Acute cystitis with hematuria     Discharge Instructions     Antibiotic injection given here in clinic. Also given a injection for pain. Given you another  antibiotic to take by mouth for the next 5 days along with Pyridium to help with discomfort. Make sure you are drinking plenty of water and avoiding caffeine this can be a bladder irritant.    ED Prescriptions    Medication Sig Dispense Auth. Provider   ciprofloxacin (CIPRO) 500 MG tablet Take 1 tablet (500 mg total) by mouth every 12 (twelve) hours for 5 days. 10 tablet Ireanna Finlayson A, NP   phenazopyridine (PYRIDIUM) 200 MG tablet Take 1 tablet (200 mg total) by mouth 3 (three) times daily. 6 tablet Loura Halt A, NP     PDMP not reviewed this encounter.   Orvan July, NP 09/06/19 1223

## 2019-09-06 NOTE — Discharge Instructions (Signed)
Antibiotic injection given here in clinic. Also given a injection for pain. Given you another antibiotic to take by mouth for the next 5 days along with Pyridium to help with discomfort. Make sure you are drinking plenty of water and avoiding caffeine this can be a bladder irritant.

## 2019-09-08 LAB — URINE CULTURE: Culture: >=100000 — AB

## 2019-09-09 ENCOUNTER — Telehealth (HOSPITAL_COMMUNITY): Payer: Self-pay | Admitting: Emergency Medicine

## 2019-09-09 NOTE — Telephone Encounter (Signed)
Urine culture was positive for e coli and was given cipro  at urgent care visit. Pt contacted and made aware, educated on completing antibiotic and to follow up if symptoms are persistent. Verbalized understanding.    

## 2020-03-10 ENCOUNTER — Ambulatory Visit (HOSPITAL_COMMUNITY)
Admission: EM | Admit: 2020-03-10 | Discharge: 2020-03-10 | Disposition: A | Discharge status: Home / Self Care | Payer: Medicaid Other | Attending: Urgent Care | Admitting: Urgent Care

## 2020-03-10 ENCOUNTER — Encounter (HOSPITAL_COMMUNITY): Payer: Self-pay

## 2020-03-10 ENCOUNTER — Other Ambulatory Visit: Payer: Self-pay

## 2020-03-10 DIAGNOSIS — B9689 Other specified bacterial agents as the cause of diseases classified elsewhere: Secondary | ICD-10-CM | POA: Insufficient documentation

## 2020-03-10 DIAGNOSIS — R102 Pelvic and perineal pain: Secondary | ICD-10-CM

## 2020-03-10 DIAGNOSIS — N760 Acute vaginitis: Secondary | ICD-10-CM | POA: Insufficient documentation

## 2020-03-10 DIAGNOSIS — R300 Dysuria: Secondary | ICD-10-CM

## 2020-03-10 DIAGNOSIS — N898 Other specified noninflammatory disorders of vagina: Secondary | ICD-10-CM

## 2020-03-10 LAB — POCT URINALYSIS DIP (DEVICE)
Bilirubin Urine: NEGATIVE
Glucose, UA: NEGATIVE mg/dL
Ketones, ur: NEGATIVE mg/dL
Leukocytes,Ua: NEGATIVE
Nitrite: NEGATIVE
Protein, ur: 30 mg/dL — AB
Specific Gravity, Urine: >=1.03 (ref 1.005–1.030)
Urobilinogen, UA: 4 mg/dL — ABNORMAL HIGH (ref 0.0–1.0)
pH: 6 (ref 5.0–8.0)

## 2020-03-10 LAB — POC URINE PREG, ED: Preg Test, Ur: NEGATIVE

## 2020-03-10 MED ORDER — FLUCONAZOLE 150 MG PO TABS: 150.0000 mg | ORAL_TABLET | ORAL | 0 refills | Status: DC

## 2020-03-10 MED ORDER — METRONIDAZOLE 500 MG PO TABS: 500.0000 mg | ORAL_TABLET | Freq: Two times a day (BID) | ORAL | 0 refills | Status: DC

## 2020-03-10 MED ORDER — NAPROXEN 500 MG PO TABS: 500.0000 mg | ORAL_TABLET | Freq: Two times a day (BID) | ORAL | 0 refills | Status: DC

## 2020-03-10 NOTE — ED Triage Notes (Signed)
Pt states she has pelvis pain and vaginal discharge x 3 days.

## 2020-03-10 NOTE — ED Provider Notes (Signed)
[facility]   MRN: [id number] DOB: 09/04/1981  Subjective:   Natalie Fischer is a 39 y.o. female presenting for 3-day history of acute onset dysuria, vaginal discharge, pelvic pain.  Patient states that she has had vaginal bleeding, thinks it is her cycle.  She is sexually active.  However, she has had 2 ectopic pregnancies.  She is not on any form of birth control, does not use condoms for protection. Has history of BV, yeast infections. Feels like her sole sex partner is reliable. Has little concern for STI tx.   No current facility-administered medications for this encounter.  Current Outpatient Medications:  .  ARIPiprazole (ABILIFY) 15 MG tablet, Take 1 tablet (15 mg total) by mouth at bedtime. For mood control (Patient taking differently: Take 15 mg by mouth daily. For mood control), Disp: 15 tablet, Rfl: 0 .  hydrOXYzine (ATARAX/VISTARIL) 50 MG tablet, Take 50 mg by mouth 3 (three) times daily., Disp: , Rfl:  .  ibuprofen (ADVIL,MOTRIN) 200 MG tablet, Take 800 mg by mouth every 6 (six) hours as needed for headache or moderate pain., Disp: , Rfl:  .  mirtazapine (REMERON) 30 MG tablet, Take 1 tablet (30 mg total) by mouth at bedtime. For depression, Disp: 30 tablet, Rfl: 0 .  phenazopyridine (PYRIDIUM) 200 MG tablet, Take 1 tablet (200 mg total) by mouth 3 (three) times daily., Disp: 6 tablet, Rfl: 0   Allergies  Allergen Reactions  . Depakene [Divalproex Sodium] Other (See Comments)    "made me feel like I was going to pass out"    Past Medical History:  Diagnosis Date  . Anxiety   . Bipolar 1 disorder (HCC)   . Carpal tunnel syndrome of right wrist 2016  . Chlamydia   . Constipation   . Ectopic pregnancy 2008 and 2014   2008 required surgery, 2nd Misoprostol  . GERD (gastroesophageal reflux disease)   . Insomnia   . Trichomonas vaginitis   . UTI (lower urinary tract infection)      Past Surgical History:  Procedure Laterality Date  . ECTOPIC PREGNANCY  SURGERY Right 2008    Family History  Problem Relation Age of Onset  . Cancer Sister 64  . COPD Sister   . Alcohol abuse Neg Hx   . Arthritis Neg Hx   . Asthma Neg Hx   . Birth defects Neg Hx   . Depression Neg Hx   . Diabetes Neg Hx   . Drug abuse Neg Hx   . Early death Neg Hx   . Hearing loss Neg Hx   . Heart disease Neg Hx   . Hyperlipidemia Neg Hx   . Hypertension Neg Hx   . Kidney disease Neg Hx   . Learning disabilities Neg Hx   . Mental illness Neg Hx   . Mental retardation Neg Hx   . Miscarriages / Stillbirths Neg Hx   . Stroke Neg Hx   . Vision loss Neg Hx   . Varicose Veins Neg Hx     Social History   Tobacco Use  . Smoking status: Current Every Day Smoker    Packs/day: 1.00    Start date: 05/15/1999  . Smokeless tobacco: Never Used  Substance Use Topics  . Alcohol use: Yes    Alcohol/week: 0.0 standard drinks    Comment: occassional  . Drug use: No    Types: Cocaine    Comment: Last used June 2019    ROS   Objective:  Vitals: BP 100/70 (BP Location: Right Arm)   Pulse 92   Temp 98.3 F (36.8 C) (Oral)   Resp 15   Wt 127 lb (57.6 kg)   LMP 03/10/2020   SpO2 97%   BMI 17.71 kg/m   Physical Exam Constitutional:      General: She is not in acute distress.    Appearance: Normal appearance. She is well-developed. She is not ill-appearing, toxic-appearing or diaphoretic.  HENT:     Head: Normocephalic and atraumatic.     Nose: Nose normal.     Mouth/Throat:     Mouth: Mucous membranes are moist.     Pharynx: Oropharynx is clear.  Eyes:     General: No scleral icterus.       Right eye: No discharge.        Left eye: No discharge.     Extraocular Movements: Extraocular movements intact.     Conjunctiva/sclera: Conjunctivae normal.     Pupils: Pupils are equal, round, and reactive to light.  Cardiovascular:     Rate and Rhythm: Normal rate.  Pulmonary:     Effort: Pulmonary effort is normal.  Abdominal:     General: Bowel sounds are  normal. There is no distension.     Palpations: Abdomen is soft. There is no mass.     Tenderness: There is abdominal tenderness (pelvic/lower abdomen, R>L). There is no right CVA tenderness, left CVA tenderness, guarding or rebound.  Skin:    General: Skin is warm and dry.  Neurological:     General: No focal deficit present.     Mental Status: She is alert and oriented to person, place, and time.  Psychiatric:        Mood and Affect: Mood normal.        Behavior: Behavior normal.        Thought Content: Thought content normal.        Judgment: Judgment normal.    Results for orders placed or performed during the hospital encounter of 03/10/20 (from the past 24 hour(s))  POCT urinalysis dip (device)     Status: Abnormal   Collection Time: 03/10/20  3:39 PM  Result Value Ref Range   Glucose, UA NEGATIVE NEGATIVE mg/dL   Bilirubin Urine NEGATIVE NEGATIVE   Ketones, ur NEGATIVE NEGATIVE mg/dL   Specific Gravity, Urine >=1.030 1.005 - 1.030   Hgb urine dipstick MODERATE (A) NEGATIVE   pH 6.0 5.0 - 8.0   Protein, ur 30 (A) NEGATIVE mg/dL   Urobilinogen, UA 4.0 (H) 0.0 - 1.0 mg/dL   Nitrite NEGATIVE NEGATIVE   Leukocytes,Ua NEGATIVE NEGATIVE  POC urine pregnancy     Status: None   Collection Time: 03/10/20  3:48 PM  Result Value Ref Range   Preg Test, Ur NEGATIVE NEGATIVE    Assessment and Plan :   PDMP not reviewed this encounter.  1. Vaginal discharge   2. Pelvic pain in female   3. Dysuria   4. Acute vaginitis   5. Bacterial vaginosis     Will cover for yeast and BV infections with Flagyl, diflucan. Use naproxen for pain and inflammation. Labs pending, will treat as appropriate. Counseled patient on potential for adverse effects with medications prescribed/recommended today, ER and return-to-clinic precautions discussed, patient verbalized understanding.    Jaynee Eagles, PA-C 03/10/20 1600

## 2020-03-11 LAB — CERVICOVAGINAL ANCILLARY ONLY
Bacterial Vaginitis (gardnerella): POSITIVE — AB
Candida Glabrata: NEGATIVE
Candida Vaginitis: NEGATIVE
Chlamydia: NEGATIVE
Comment: NEGATIVE
Comment: NEGATIVE
Comment: NEGATIVE
Comment: NEGATIVE
Comment: NEGATIVE
Comment: NORMAL
Neisseria Gonorrhea: NEGATIVE
Trichomonas: POSITIVE — AB

## 2020-03-11 LAB — URINE CULTURE

## 2020-06-08 ENCOUNTER — Ambulatory Visit (HOSPITAL_COMMUNITY): Payer: No Payment, Other | Admitting: Clinical

## 2020-06-30 ENCOUNTER — Other Ambulatory Visit: Payer: Self-pay

## 2020-06-30 ENCOUNTER — Encounter (HOSPITAL_COMMUNITY): Payer: Self-pay | Admitting: Psychiatry

## 2020-06-30 ENCOUNTER — Telehealth (INDEPENDENT_AMBULATORY_CARE_PROVIDER_SITE_OTHER): Payer: No Payment, Other | Admitting: Psychiatry

## 2020-06-30 DIAGNOSIS — F411 Generalized anxiety disorder: Secondary | ICD-10-CM | POA: Diagnosis not present

## 2020-06-30 DIAGNOSIS — F3132 Bipolar disorder, current episode depressed, moderate: Secondary | ICD-10-CM

## 2020-06-30 MED ORDER — ARIPIPRAZOLE ER 400 MG IM PRSY: 400.0000 mg | PREFILLED_SYRINGE | Freq: Once | INTRAMUSCULAR | Status: DC

## 2020-06-30 MED ORDER — MIRTAZAPINE 30 MG PO TABS: 30.0000 mg | ORAL_TABLET | Freq: Every day | ORAL | 2 refills | Status: DC

## 2020-06-30 MED ORDER — ARIPIPRAZOLE ER 400 MG IM PRSY: 400.0000 mg | PREFILLED_SYRINGE | INTRAMUSCULAR | 11 refills | Status: DC

## 2020-06-30 MED ORDER — HYDROXYZINE HCL 50 MG PO TABS: 50.0000 mg | ORAL_TABLET | Freq: Three times a day (TID) | ORAL | 2 refills | Status: DC

## 2020-06-30 MED ORDER — BUPROPION HCL ER (XL) 150 MG PO TB24: 150.0000 mg | ORAL_TABLET | ORAL | 2 refills | Status: DC

## 2020-06-30 NOTE — Progress Notes (Signed)
Psychiatric Initial Adult Assessment  Virtual Visit via Video Note  I connected with Natalie Fischer Lozon on 06/30/20 at 11:00 AM EDT by a video enabled telemedicine application and verified that I am speaking with the correct person using two identifiers.  Location: Patient: Home Provider: Clinic   I discussed the limitations of evaluation and management by telemedicine and the availability of in person appointments. The patient expressed understanding and agreed to proceed.  I provided 45 minutes of non-face-to-face time during this encounter.     Patient Identification: Natalie Fischer Mall MRN:  [id number] Date of Evaluation:  06/30/2020 [facility]erral [facility] Chief Complaint:  "I need my injection" Visit Diagnosis:    ICD-10-CM   1. Bipolar 1 disorder, depressed, moderate (HCC)  F31.32 mirtazapine (REMERON) 30 MG tablet    ARIPiprazole ER (ABILIFY MAINTENA) 400 MG prefilled syringe 400 mg    buPROPion (WELLBUTRIN XL) 150 MG 24 hr tablet    ARIPiprazole ER (ABILIFY MAINTENA) 400 MG PRSY prefilled syringe  2. Generalized anxiety disorder  F41.1 hydrOXYzine (ATARAX/VISTARIL) 50 MG tablet    mirtazapine (REMERON) 30 MG tablet    buPROPion (WELLBUTRIN XL) 150 MG 24 hr tablet    History of Present Illness: 39 year old female seen today for initial psychiatric evaluation.  [facility]  She has a psychiatric history of bipolar disorder, anxiety, schizoaffective disorder, PTSD, borderline disorder, cocaine use disorder (in remission), and disassociative identity disorder.  Patient notes that she has been feeling more depressed lately and notes that she has not received her Abilify injection in months.  During assessment she is well-groomed, pleasant, cooperative, and maintains good eye contact.  She informed Clinical research associatewriter that she has been experiencing symptoms of depression such as depressed mood, anhedonia, agitation, feelings  of worthlessness, difficulty concentrating, hopelessness, impaired memory, anxiety, decreased energy, decreased appetite, and passive suicidal thoughts.  She notes that at times she wishes that she was not here.  She notes that she does not want to harm herself currently and contracts for safety at this time.  She denies HI/AH.  She endorses VH noting that at times she sees bugs.  Patient informed Clinical research associatewriter that in January of 2021 her brother was murdered.  She notes that this exacerbates her depression however reports that she was depressed prior to his death.  She denies flashbacks, nightmares, or avoidant behavior surrounding her brothers death.  She however notes that she is distractible, has fluctuations in her moods, racing thoughts, irritability, impulsive behavior, and is sexually inappropriate at times.  She notes that she has multiple sexual partners.  Patient notes that she has not had her Abilify injection in a few months.  She informed Clinical research associatewriter that she had Abilify 15 mg pills at home and was taking the pills until she could be seen by provider.  She is agreeable to restart her Abilify injection and will receive her next injection on 07/01/2020.  She is also agreeable to starting Wellbutrin 150 mg daily to help improve symptoms of depression. Potential side effects of medication and risks vs benefits of treatment vs non-treatment were explained and discussed. All questions were answered.Patient notes that at this time she does not want therapy however notes that she will call in the future if she feels that she needs it.  No other concerns noted at this time. Associated Signs/Symptoms: Depression Symptoms:  depressed mood, anhedonia, psychomotor agitation, feelings of worthlessness/guilt, difficulty concentrating, hopelessness, impaired memory, suicidal thoughts without plan, anxiety,  loss of energy/fatigue, decreased appetite, (Hypo) Manic Symptoms:  Distractibility, Elevated Mood, Flight  of Ideas, Hallucinations, Impulsivity, Irritable Mood, Sexually Inapproprite Behavior, Anxiety Symptoms:  Denies Psychotic Symptoms:  Hallucinations: Auditory PTSD Symptoms: Had a traumatic exposure:  Notes resenlt her brother was killed.   Past Psychiatric History: Bipolar disorder, anxiety, schizoaffective disorder, PTSD, borderline disorder, cocaine use disorder (in remission), and disassociative identity disorder Previous Psychotropic Medications: Yes trialed seroquel, rexulti, and lamictal  Substance Abuse History in the last 12 months:  Yes.    Consequences of Substance Abuse: Legal Consequences:  DUI in 2015  Past Medical History:  Past Medical History:  Diagnosis Date  . Anxiety   . Bipolar 1 disorder (HCC)   . Carpal tunnel syndrome of right wrist 2016  . Chlamydia   . Constipation   . Ectopic pregnancy 2008 and 2014   2008 required surgery, 2nd Misoprostol  . GERD (gastroesophageal reflux disease)   . Insomnia   . Trichomonas vaginitis   . UTI (lower urinary tract infection)     Past Surgical History:  Procedure Laterality Date  . ECTOPIC PREGNANCY SURGERY Right 2008    Family Psychiatric History: Sister depression and paternal aunt bipolar disorder  Family History:  Family History  Problem Relation Age of Onset  . Cancer Sister 32  . COPD Sister   . Alcohol abuse Neg Hx   . Arthritis Neg Hx   . Asthma Neg Hx   . Birth defects Neg Hx   . Depression Neg Hx   . Diabetes Neg Hx   . Drug abuse Neg Hx   . Early death Neg Hx   . Hearing loss Neg Hx   . Heart disease Neg Hx   . Hyperlipidemia Neg Hx   . Hypertension Neg Hx   . Kidney disease Neg Hx   . Learning disabilities Neg Hx   . Mental illness Neg Hx   . Mental retardation Neg Hx   . Miscarriages / Stillbirths Neg Hx   . Stroke Neg Hx   . Vision loss Neg Hx   . Varicose Veins Neg Hx     Social History:   Social History   Socioeconomic History  . Marital status: Single    Spouse name:  Not on file  . Number of children: 0  . Years of education: college-1  . Highest education level: Not on file  Occupational History  . Occupation: unemployed    Comment: Previously worked [employer], Clinical biochemist.  Tobacco Use  . Smoking status: Current Every Day Smoker    Packs/day: 1.00    Start date: 05/15/1999  . Smokeless tobacco: Never Used  Substance and Sexual Activity  . Alcohol use: Yes    Alcohol/week: 0.0 standard drinks    Comment: occassional  . Drug use: No    Types: Cocaine    Comment: Last used June 2019  . Sexual activity: Yes  Other Topics Concern  . Not on file  Social History Narrative   Born [place], [place] to [place], in 1998 when her mother got a job in one of the [place] here.   Now living with her father.  Sometimes with her sister.   Parents separated.   Social Determinants of Health   Financial Resource Strain:   . Difficulty of Paying Living Expenses: Not on file  Food Insecurity:   . Worried About Programme researcher, broadcasting/film/video in the Last Year: Not on file  .  Ran Out of Food in the Last Year: Not on file  Transportation Needs:   . Lack of Transportation (Medical): Not on file  . Lack of Transportation (Non-Medical): Not on file  Physical Activity:   . Days of Exercise per Week: Not on file  . Minutes of Exercise per Session: Not on file  Stress:   . Feeling of Stress : Not on file  Social Connections:   . Frequency of Communication with Friends and Family: Not on file  . Frequency of Social Gatherings with Friends and Family: Not on file  . Attends Religious Services: Not on file  . Active Member of Clubs or Organizations: Not on file  . Attends Banker Meetings: Not on file  . Marital Status: Not on file    Additional Social History: Patient resides in [place] with her parents. She has been in a relationship for a year. She has no children. She is currently unemployed (notes that she worked at  [employer] however quite in July due to depression). She endorses smoking a half a pack of cigarettes a daily, occasional mariajuana use, and occasional alcohol use.   Allergies:   Allergies  Allergen Reactions  . Depakene [Divalproex Sodium] Other (See Comments)    "made me feel like I was going to pass out"    Metabolic Disorder Labs: Lab Results  Component Value Date   HGBA1C 6.0 (H) 02/21/2015   MPG 126 02/21/2015   No results found for: PROLACTIN Lab Results  Component Value Date   CHOL 138 02/21/2015   TRIG 60 02/21/2015   HDL 51 02/21/2015   CHOLHDL 2.7 02/21/2015   VLDL 12 02/21/2015   LDLCALC 75 02/21/2015   LDLCALC 86 04/03/2009   Lab Results  Component Value Date   TSH 0.299 (Fischer) 02/20/2015    Therapeutic Level Labs: No results found for: LITHIUM Lab Results  Component Value Date   CBMZ 7.1 02/21/2015   No results found for: VALPROATE  Current Medications: Current Outpatient Medications  Medication Sig Dispense Refill  . ARIPiprazole ER (ABILIFY MAINTENA) 400 MG PRSY prefilled syringe Inject 400 mg into the muscle every 28 (twenty-eight) days. 1 each 11  . buPROPion (WELLBUTRIN XL) 150 MG 24 hr tablet Take 1 tablet (150 mg total) by mouth every morning. 30 tablet 2  . fluconazole (DIFLUCAN) 150 MG tablet Take 1 tablet (150 mg total) by mouth once a week. 2 tablet 0  . hydrOXYzine (ATARAX/VISTARIL) 50 MG tablet Take 1 tablet (50 mg total) by mouth 3 (three) times daily. 30 tablet 2  . ibuprofen (ADVIL,MOTRIN) 200 MG tablet Take 800 mg by mouth every 6 (six) hours as needed for headache or moderate pain.    . metroNIDAZOLE (FLAGYL) 500 MG tablet Take 1 tablet (500 mg total) by mouth 2 (two) times daily with a meal. DO NOT CONSUME ALCOHOL WHILE TAKING THIS MEDICATION. 14 tablet 0  . mirtazapine (REMERON) 30 MG tablet Take 1 tablet (30 mg total) by mouth at bedtime. For depression 30 tablet 2  . naproxen (NAPROSYN) 500 MG tablet Take 1 tablet (500 mg total) by  mouth 2 (two) times daily with a meal. 30 tablet 0  . phenazopyridine (PYRIDIUM) 200 MG tablet Take 1 tablet (200 mg total) by mouth 3 (three) times daily. 6 tablet 0   Current Facility-Administered Medications  Medication Dose Route Frequency Provider Last Rate Last Admin  . [START ON 07/01/2020] ARIPiprazole ER (ABILIFY MAINTENA) 400 MG prefilled syringe 400 mg  400 mg Intramuscular Once Shanna Cisco, NP        Musculoskeletal: Strength & Muscle Tone: Unable to assess due to telehealth visit Gait & Station: Unable to assess due to telehealth visit Patient leans: N/A  Psychiatric Specialty Exam: Review of Systems  There were no vitals taken for this visit.There is no height or weight on file to calculate BMI.  General Appearance: Well Groomed  Eye Contact:  Good  Speech:  Clear and Coherent and Normal Rate  Volume:  Normal  Mood:  Depressed  Affect:  Congruent  Thought Process:  Coherent, Goal Directed and Linear  Orientation:  Full (Time, Place, and Person)  Thought Content:  Logical and Hallucinations: Visual  Suicidal Thoughts:  Yes.  without intent/plan  Homicidal Thoughts:  No  Memory:  Immediate;   Fair Recent;   Fair Remote;   Fair  Judgement:  Fair  Insight:  Fair  Psychomotor Activity:  Normal  Concentration:  Concentration: Fair and Attention Span: Fair  Recall:  Good  Fund of Knowledge:Good  Language: Good  Akathisia:  No  Handed:  Right  AIMS (if indicated):  Not done  Assets:  Communication Skills Desire for Improvement Housing Intimacy Social Support  ADL's:  Intact  Cognition: WNL  Sleep:  Good   Screenings: AIMS     [facility]) [facility]  AIMS Total Score 0 0 0    AUDIT     [facility]) [facility] ([facility]  Alcohol Use Disorder Identification Test Final Score (AUDIT) 3 18 1     PHQ2-9     Office Visit from 12/18/2015 in [facility]  PHQ-2 Total Score 5  PHQ-9 Total Score 26      Assessment and Plan: Patient endorses increased depression and visual hallucinations.  She is agreeable to restarting Abilify 400 mg injection on 07/01/2020.  She is also agreeable to start Wellbutrin as prescribed XL150 mg daily to help improve symptoms of depression.  She will continue all other medications as prescribed.  1. Bipolar 1 disorder, depressed, moderate (HCC)  Continue- mirtazapine (REMERON) 30 MG tablet; Take 1 tablet (30 mg total) by mouth at bedtime. For depression  Dispense: 30 tablet; Refill: 2 Restart- ARIPiprazole ER (ABILIFY MAINTENA) 400 MG prefilled syringe 400 mg Start- buPROPion (WELLBUTRIN XL) 150 MG 24 hr tablet; Take 1 tablet (150 mg total) by mouth every morning.  Dispense: 30 tablet; Refill: 2 Restart- ARIPiprazole ER (ABILIFY MAINTENA) 400 MG PRSY prefilled syringe; Inject 400 mg into the muscle every 28 (twenty-eight) days.  Dispense: 1 each; Refill: 11  2. Generalized anxiety disorder  Continue- hydrOXYzine (ATARAX/VISTARIL) 50 MG tablet; Take 1 tablet (50 mg total) by mouth 3 (three) times daily.  Dispense: 30 tablet; Refill: 2 Continue- mirtazapine (REMERON) 30 MG tablet; Take 1 tablet (30 mg total) by mouth at bedtime. For depression  Dispense: 30 tablet; Refill: 2 Start- buPROPion (WELLBUTRIN XL) 150 MG 24 hr tablet; Take 1 tablet (150 mg total) by mouth every morning.  Dispense: 30 tablet; Refill: 2  Follow up in 3 months.     08/31/2020, NP 9/7/20211:02 PM

## 2020-07-01 ENCOUNTER — Ambulatory Visit (HOSPITAL_COMMUNITY): Payer: No Payment, Other

## 2020-07-06 ENCOUNTER — Telehealth (HOSPITAL_COMMUNITY): Payer: Self-pay | Admitting: *Deleted

## 2020-07-06 NOTE — Telephone Encounter (Signed)
Left message to remind to call or walk in for her injection, she didn't show up for scheduled time last week.

## 2020-07-22 ENCOUNTER — Ambulatory Visit (HOSPITAL_COMMUNITY): Payer: No Payment, Other

## 2020-07-27 ENCOUNTER — Ambulatory Visit (HOSPITAL_COMMUNITY): Payer: No Payment, Other

## 2020-08-04 ENCOUNTER — Telehealth (HOSPITAL_COMMUNITY): Payer: Self-pay | Admitting: Clinical

## 2020-08-04 ENCOUNTER — Ambulatory Visit (HOSPITAL_COMMUNITY): Payer: Self-pay | Admitting: Clinical

## 2020-08-04 ENCOUNTER — Other Ambulatory Visit: Payer: Self-pay

## 2020-08-04 NOTE — Telephone Encounter (Signed)
Therapist sent the client a link for the scheduled MyChart video visit. Client did not check in. Therapist attempted phone call to the clients cell phone number. Client did not answer. Therapist was unable to leave a voice mail because the mail box was not set up.

## 2020-08-17 ENCOUNTER — Encounter (HOSPITAL_COMMUNITY): Payer: Self-pay | Admitting: Emergency Medicine

## 2020-08-17 ENCOUNTER — Ambulatory Visit (HOSPITAL_COMMUNITY)
Admission: EM | Admit: 2020-08-17 | Discharge: 2020-08-17 | Disposition: A | Discharge status: Home / Self Care | Payer: Medicaid Other | Attending: Family Medicine | Admitting: Family Medicine

## 2020-08-17 ENCOUNTER — Other Ambulatory Visit: Payer: Self-pay

## 2020-08-17 DIAGNOSIS — B9689 Other specified bacterial agents as the cause of diseases classified elsewhere: Secondary | ICD-10-CM | POA: Insufficient documentation

## 2020-08-17 DIAGNOSIS — N760 Acute vaginitis: Secondary | ICD-10-CM | POA: Insufficient documentation

## 2020-08-17 DIAGNOSIS — Z202 Contact with and (suspected) exposure to infections with a predominantly sexual mode of transmission: Secondary | ICD-10-CM | POA: Insufficient documentation

## 2020-08-17 LAB — POC URINE PREG, ED: Preg Test, Ur: NEGATIVE

## 2020-08-17 MED ORDER — FLUCONAZOLE 150 MG PO TABS: 150.0000 mg | ORAL_TABLET | Freq: Every day | ORAL | 0 refills | Status: DC

## 2020-08-17 MED ORDER — METRONIDAZOLE 500 MG PO TABS: 500.0000 mg | ORAL_TABLET | Freq: Two times a day (BID) | ORAL | 0 refills | Status: DC

## 2020-08-17 NOTE — Discharge Instructions (Signed)
Check my chart for your results Take the antibiotic for a week Avoid sexual encounters while on the antibiotic

## 2020-08-17 NOTE — ED Provider Notes (Signed)
[facility]    CSN: [id number] Arrival date & time: 08/17/20  1514      History   Chief Complaint Chief Complaint  Patient presents with  . Vaginal Discharge    HPI Natalie Fischer is a 39 y.o. female.   HPI  Patient states she has had BV before.  She feels like she has BV again now.  She has scant vaginal discharge and irritation.  She also has a swollen gland in her groin.  No abdominal pain.  No abnormal vaginal bleeding.  She is not pregnant.  No dysuria or urinary symptoms.  She states that she was treated for trichomonas.  Her boyfriend was not treated.  She is worried she might have trichomonas again at this time.  Past Medical History:  Diagnosis Date  . Anxiety   . Bipolar 1 disorder (HCC)   . Carpal tunnel syndrome of right wrist 2016  . Chlamydia   . Constipation   . Ectopic pregnancy 2008 and 2014   2008 required surgery, 2nd Misoprostol  . GERD (gastroesophageal reflux disease)   . Insomnia   . Trichomonas vaginitis   . UTI (lower urinary tract infection)     Patient Active Problem List   Diagnosis Date Noted  . Polysubstance (excluding opioids) dependence, daily use (HCC) 08/08/2017  . Bipolar 1 disorder, depressed, severe (HCC) 08/07/2017  . Mild benzodiazepine use disorder (HCC) 01/23/2016  . Cocaine abuse (HCC) 01/22/2016  . Bipolar 1 disorder, depressed, moderate (HCC) 02/20/2015  . Cocaine use disorder, severe, dependence (HCC)   . Cannabis use disorder, moderate, dependence (HCC)   . ANEMIA 05/11/2010  . LOSS OF WEIGHT 03/07/2008  . ALLERGIC RHINITIS 01/18/2008  . INSOMNIA 01/18/2008  . ANXIETY 08/31/2007  . GERD 06/18/2007  . CONSTIPATION 06/18/2007    Past Surgical History:  Procedure Laterality Date  . ECTOPIC PREGNANCY SURGERY Right 2008    OB History    Gravida  2   Para      Term      Preterm      AB  2   Living  0     SAB      TAB      Ectopic  2   Multiple      Live Births                Home Medications    Prior to Admission medications   Medication Sig Start Date End Date Taking? Authorizing Provider  hydrOXYzine (ATARAX/VISTARIL) 50 MG tablet Take 1 tablet (50 mg total) by mouth 3 (three) times daily. 06/30/20  Yes Toy Cookey E, NP  ibuprofen (ADVIL,MOTRIN) 200 MG tablet Take 800 mg by mouth every 6 (six) hours as needed for headache or moderate pain.   Yes [provider]  mirtazapine (REMERON) 30 MG tablet Take 1 tablet (30 mg total) by mouth at bedtime. For depression 06/30/20  Yes Toy Cookey E, NP  ARIPiprazole ER (ABILIFY MAINTENA) 400 MG PRSY prefilled syringe Inject 400 mg into the muscle every 28 (twenty-eight) days. 06/30/20   Shanna Cisco, NP  buPROPion (WELLBUTRIN XL) 150 MG 24 hr tablet Take 1 tablet (150 mg total) by mouth every morning. 06/30/20   Shanna Cisco, NP  metroNIDAZOLE (FLAGYL) 500 MG tablet Take 1 tablet (500 mg total) by mouth 2 (two) times daily with a meal. DO NOT CONSUME ALCOHOL WHILE TAKING THIS MEDICATION. 08/17/20   Eustace Moore, MD  naproxen (NAPROSYN) 500 MG  tablet Take 1 tablet (500 mg total) by mouth 2 (two) times daily with a meal. 03/10/20   Wallis Bamberg, PA-C  pantoprazole (PROTONIX) 20 MG tablet Take 1 tablet (20 mg total) by mouth daily. For acid reflux Patient not taking: Reported on 01/02/2019 08/10/17 09/06/19  Sanjuana Kava, NP    Family History Family History  Problem Relation Age of Onset  . Cancer Sister 42  . COPD Sister   . Alcohol abuse Neg Hx   . Arthritis Neg Hx   . Asthma Neg Hx   . Birth defects Neg Hx   . Depression Neg Hx   . Diabetes Neg Hx   . Drug abuse Neg Hx   . Early death Neg Hx   . Hearing loss Neg Hx   . Heart disease Neg Hx   . Hyperlipidemia Neg Hx   . Hypertension Neg Hx   . Kidney disease Neg Hx   . Learning disabilities Neg Hx   . Mental illness Neg Hx   . Mental retardation Neg Hx   . Miscarriages / Stillbirths Neg Hx   . Stroke Neg Hx   . Vision  loss Neg Hx   . Varicose Veins Neg Hx     Social History Social History   Tobacco Use  . Smoking status: Current Every Day Smoker    Packs/day: 1.00    Start date: 05/15/1999  . Smokeless tobacco: Never Used  Substance Use Topics  . Alcohol use: Yes    Alcohol/week: 0.0 standard drinks    Comment: occassional  . Drug use: No    Types: Cocaine    Comment: Last used June 2019     Allergies   Depakene [divalproex sodium]   Review of Systems Review of Systems See HPI  Physical Exam Triage Vital Signs ED Triage Vitals  Enc Vitals Group     BP 08/17/20 1628 125/81     Pulse Rate 08/17/20 1628 (!) 103     Resp 08/17/20 1628 18     Temp 08/17/20 1628 97.9 F (36.6 C)     Temp Source 08/17/20 1628 Oral     SpO2 08/17/20 1628 99 %     Weight 08/17/20 1631 125 lb 8 oz (56.9 kg)     Height 08/17/20 1631 5\' 11"  (1.803 m)     Head Circumference --      Peak Flow --      Pain Score 08/17/20 1630 2     Pain Loc --      Pain Edu? --      Excl. in GC? --    No data found.  Updated Vital Signs BP 125/81 (BP Location: Right Arm)   Pulse (!) 103   Temp 97.9 F (36.6 C) (Oral)   Resp 18   Ht 5\' 11"  (1.803 m)   Wt 56.9 kg   LMP 07/08/2020   SpO2 99%   BMI 17.50 kg/m       Physical Exam Constitutional:      General: She is not in acute distress.    Appearance: She is well-developed.     Comments: Exam by observation.  Mask is in place.  HENT:     Head: Normocephalic and atraumatic.  Eyes:     Conjunctiva/sclera: Conjunctivae normal.     Pupils: Pupils are equal, round, and reactive to light.  Cardiovascular:     Rate and Rhythm: Normal rate.  Pulmonary:     Effort: Pulmonary effort is normal.  No respiratory distress.  Abdominal:     Palpations: Abdomen is soft.  Genitourinary:    Comments: Technique her vaginal self swab is reviewed Musculoskeletal:        General: Normal range of motion.     Cervical back: Normal range of motion.  Skin:    General:  Skin is warm and dry.  Neurological:     Mental Status: She is alert.  Psychiatric:        Behavior: Behavior normal.      UC Treatments / Results  Labs (all labs ordered are listed, but only abnormal results are displayed) Labs Reviewed  POC URINE PREG, ED  CERVICOVAGINAL ANCILLARY ONLY    EKG   Radiology No results found.  Procedures Procedures (including critical care time)  Medications Ordered in UC Medications - No data to display  Initial Impression / Assessment and Plan / UC Course  I have reviewed the triage vital signs and the nursing notes.  Pertinent labs & imaging results that were available during my care of the patient were reviewed by me and considered in my medical decision making (see chart for details).    Pregnancy test is negativ safe sex is discussed  Final Clinical Impressions(s) / UC Diagnoses   Final diagnoses:  BV (bacterial vaginosis)  Possible exposure to STD     Discharge Instructions     Check my chart for your results Take the antibiotic for a week Avoid sexual encounters while on the antibiotic   ED Prescriptions    Medication Sig Dispense Auth. Provider   metroNIDAZOLE (FLAGYL) 500 MG tablet Take 1 tablet (500 mg total) by mouth 2 (two) times daily with a meal. DO NOT CONSUME ALCOHOL WHILE TAKING THIS MEDICATION. 14 tablet Eustace Moore, MD     PDMP not reviewed this encounter.   Eustace Moore, MD 08/17/20 1729

## 2020-08-17 NOTE — ED Triage Notes (Signed)
Pt reports vaginal DC with swelling to Rt groin area. For over one week

## 2020-08-18 LAB — CERVICOVAGINAL ANCILLARY ONLY
Bacterial Vaginitis (gardnerella): POSITIVE — AB
Candida Glabrata: NEGATIVE
Candida Vaginitis: NEGATIVE
Chlamydia: NEGATIVE
Comment: NEGATIVE
Comment: NEGATIVE
Comment: NEGATIVE
Comment: NEGATIVE
Comment: NEGATIVE
Comment: NORMAL
Neisseria Gonorrhea: NEGATIVE
Trichomonas: NEGATIVE

## 2020-09-14 ENCOUNTER — Telehealth (HOSPITAL_COMMUNITY): Payer: Self-pay | Admitting: *Deleted

## 2020-09-14 NOTE — Telephone Encounter (Signed)
Call from patient to reschedule her missed injection appt. Called her back to reschedule her and left a message for her to come in on Wed the 24 th

## 2020-09-20 ENCOUNTER — Encounter (HOSPITAL_COMMUNITY): Payer: Self-pay | Admitting: *Deleted

## 2020-09-20 ENCOUNTER — Ambulatory Visit (HOSPITAL_COMMUNITY)
Admission: EM | Admit: 2020-09-20 | Discharge: 2020-09-20 | Disposition: A | Discharge status: Home / Self Care | Payer: 59 | Attending: Family Medicine | Admitting: Family Medicine

## 2020-09-20 ENCOUNTER — Other Ambulatory Visit: Payer: Self-pay

## 2020-09-20 DIAGNOSIS — L42 Pityriasis rosea: Secondary | ICD-10-CM | POA: Insufficient documentation

## 2020-09-20 LAB — CBC WITH DIFFERENTIAL/PLATELET
Abs Immature Granulocytes: 0.02 10*3/uL (ref 0.00–0.07)
Basophils Absolute: 0 10*3/uL (ref 0.0–0.1)
Basophils Relative: 0 %
Eosinophils Absolute: 0.1 10*3/uL (ref 0.0–0.5)
Eosinophils Relative: 2 %
HCT: 33.2 % — ABNORMAL LOW (ref 36.0–46.0)
Hemoglobin: 10.8 g/dL — ABNORMAL LOW (ref 12.0–15.0)
Immature Granulocytes: 1 %
Lymphocytes Relative: 26 %
Lymphs Abs: 1.1 10*3/uL (ref 0.7–4.0)
MCH: 28.6 pg (ref 26.0–34.0)
MCHC: 32.5 g/dL (ref 30.0–36.0)
MCV: 87.8 fL (ref 80.0–100.0)
Monocytes Absolute: 0.5 10*3/uL (ref 0.1–1.0)
Monocytes Relative: 12 %
Neutro Abs: 2.5 10*3/uL (ref 1.7–7.7)
Neutrophils Relative %: 59 %
Platelets: 188 10*3/uL (ref 150–400)
RBC: 3.78 MIL/uL — ABNORMAL LOW (ref 3.87–5.11)
RDW: 13 % (ref 11.5–15.5)
WBC: 4.2 10*3/uL (ref 4.0–10.5)
nRBC: 0 % (ref 0.0–0.2)

## 2020-09-20 LAB — HIV ANTIBODY (ROUTINE TESTING W REFLEX): HIV Screen 4th Generation wRfx: NONREACTIVE

## 2020-09-20 MED ORDER — KETOCONAZOLE 2 % EX CREA: 1.0000 "application " | TOPICAL_CREAM | Freq: Two times a day (BID) | CUTANEOUS | 0 refills | Status: DC

## 2020-09-20 NOTE — Discharge Instructions (Addendum)
The rash appears to be Pityriasis Rosea, but we are running other tests to be sure there is no underlying secondary problem.

## 2020-09-20 NOTE — ED Provider Notes (Signed)
[facility]    CSN: [id number] Arrival date & time: 09/20/20  1024      History   Chief Complaint Chief Complaint  Patient presents with  . Rash    HPI Natalie Fischer is a 39 y.o. female.   Established [facility] patient  C/O pruritic "bump" rash to perioral area and BUE x one week.  Has been using OTC treatment without relief.  The rash involves all parts of body, including face, with various sized round raised lesions with clearing center.  Patient has h/o polysubstance dependence.  Continues to snort cocaine.     Past Medical History:  Diagnosis Date  . Anxiety   . Bipolar 1 disorder (HCC)   . Carpal tunnel syndrome of right wrist 2016  . Chlamydia   . Constipation   . Ectopic pregnancy 2008 and 2014   2008 required surgery, 2nd Misoprostol  . GERD (gastroesophageal reflux disease)   . Insomnia   . Trichomonas vaginitis   . UTI (lower urinary tract infection)     Patient Active Problem List   Diagnosis Date Noted  . Polysubstance (excluding opioids) dependence, daily use (HCC) 08/08/2017    08/07/2017    01/23/2016    01/22/2016  . Bipolar 1 disorder, depressed, moderate (HCC) 02/20/2015  .    .    . ANEMIA 05/11/2010  . LOSS OF WEIGHT 03/07/2008  .  01/18/2008  .  01/18/2008  .  08/31/2007  .  06/18/2007  .  06/18/2007    Past Surgical History:  Procedure Laterality Date  . ECTOPIC PREGNANCY SURGERY Right 2008    OB History    Gravida  2   Para      Term      Preterm      AB  2   Living  0     SAB      TAB      Ectopic  2   Multiple      Live Births               Home Medications    Prior to Admission medications   Medication Sig Start Date End Date Taking? Authorizing Provider  ARIPiprazole ER (ABILIFY MAINTENA) 400 MG PRSY prefilled syringe Inject 400 mg into the muscle every 28 (twenty-eight) days. 06/30/20  Yes Toy Cookey E, NP  buPROPion (WELLBUTRIN XL) 150 MG 24 hr tablet Take 1 tablet (150  mg total) by mouth every morning. 06/30/20  Yes Toy Cookey E, NP  hydrOXYzine (ATARAX/VISTARIL) 50 MG tablet Take 1 tablet (50 mg total) by mouth 3 (three) times daily. 06/30/20  Yes Toy Cookey E, NP  ibuprofen (ADVIL,MOTRIN) 200 MG tablet Take 800 mg by mouth every 6 (six) hours as needed for headache or moderate pain.   Yes [provider]  mirtazapine (REMERON) 30 MG tablet Take 1 tablet (30 mg total) by mouth at bedtime. For depression 06/30/20  Yes Toy Cookey E, NP  ketoconazole (NIZORAL) 2 % cream Apply 1 application topically 2 (two) times daily. 09/20/20   Elvina Sidle, MD  pantoprazole (PROTONIX) 20 MG tablet Take 1 tablet (20 mg total) by mouth daily. For acid reflux Patient not taking: Reported on 01/02/2019 08/10/17 09/06/19  Sanjuana Kava, NP    Family History Family History  Problem Relation Age of Onset  . Cancer Sister 43  . Healthy Mother   . Healthy Father   . COPD Sister   . Alcohol abuse Neg  Hx   . Arthritis Neg Hx   . Asthma Neg Hx   . Birth defects Neg Hx   . Depression Neg Hx   . Diabetes Neg Hx   . Drug abuse Neg Hx   . Early death Neg Hx   . Hearing loss Neg Hx   . Heart disease Neg Hx   . Hyperlipidemia Neg Hx   . Hypertension Neg Hx   .  Neg Hx   .  Neg Hx   .  Neg Hx   . Mental retardation Neg Hx   . Miscarriages / Stillbirths Neg Hx   . Stroke Neg Hx   . Vision loss Neg Hx   . Varicose Veins Neg Hx     Social History Social History   Tobacco Use  . Smoking status: Current Some Day Smoker    Packs/day: 1.00    Types: Cigarettes    Start date: 05/15/1999  . Smokeless tobacco: Never Used  Vaping Use  . Vaping Use: Never used  Substance Use Topics  . Alcohol use: Yes    Comment: occasionally  . Drug use: Yes    Types: Cocaine    Comment: Last used June 2019     Allergies   Depakene [divalproex sodium]   Review of Systems Review of Systems  Constitutional: Negative.   Skin: Positive for rash.      Physical Exam Triage Vital Signs ED Triage Vitals  Enc Vitals Group     BP 09/20/20 1108 122/73     Pulse Rate 09/20/20 1108 84     Resp 09/20/20 1108 16     Temp 09/20/20 1108 98 F (36.7 C)     Temp Source 09/20/20 1108 Oral     SpO2 09/20/20 1108 100 %     Weight --      Height --      Head Circumference --      Peak Flow --      Pain Score 09/20/20 1109 0     Pain Loc --      Pain Edu? --      Excl. in GC? --    No data found.  Updated Vital Signs BP 122/73   Pulse 84   Temp 98 F (36.7 C) (Oral)   Resp 16   LMP 09/19/2020 (Exact Date)   SpO2 100%    Physical Exam Vitals and nursing note reviewed.  Constitutional:      General: She is not in acute distress.    Appearance: Normal appearance.  Eyes:     Conjunctiva/sclera: Conjunctivae normal.  Musculoskeletal:     Cervical back: Normal range of motion and neck supple.  Skin:    Findings: Rash present.  Neurological:     General: No focal deficit present.     Mental Status: She is alert.  Psychiatric:        Mood and Affect: Mood normal.        Behavior: Behavior normal.        Thought Content: Thought content normal.        Judgment: Judgment normal.        UC Treatments / Results  Labs (all labs ordered are listed, but only abnormal results are displayed) Labs Reviewed  CBC WITH DIFFERENTIAL/PLATELET  HIV ANTIBODY (ROUTINE TESTING W REFLEX)    EKG   Radiology No results found.  Procedures Procedures (including critical care time)  Medications Ordered in UC Medications - No data to display  Initial Impression / Assessment and Plan / UC Course  I have reviewed the triage vital signs and the nursing notes.  Pertinent labs & imaging results that were available during my care of the patient were reviewed by me and considered in my medical decision making (see chart for details).    Final Clinical Impressions(s) / UC Diagnoses   Final diagnoses:  Pityriasis rosea      Discharge Instructions     The rash appears to be Pityriasis Rosea, but we are running other tests to be sure there is no underlying secondary problem.    ED Prescriptions    Medication Sig Dispense Auth. Provider   ketoconazole (NIZORAL) 2 % cream Apply 1 application topically 2 (two) times daily. 30 g Elvina Sidle, MD     I have reviewed the PDMP during this encounter.   Elvina Sidle, MD 09/20/20 1144

## 2020-09-20 NOTE — ED Triage Notes (Signed)
C/O pruritic "bump" rash to perioral area and BUE x few weeks.  Has been using OTC treatment without relief.

## 2020-09-30 ENCOUNTER — Other Ambulatory Visit: Payer: Self-pay

## 2020-09-30 ENCOUNTER — Telehealth (HOSPITAL_COMMUNITY): Payer: Self-pay | Admitting: Psychiatry

## 2021-02-10 ENCOUNTER — Ambulatory Visit (HOSPITAL_COMMUNITY)
Admission: EM | Admit: 2021-02-10 | Discharge: 2021-02-10 | Disposition: A | Discharge status: Home / Self Care | Payer: 59 | Attending: Urgent Care | Admitting: Urgent Care

## 2021-02-10 ENCOUNTER — Other Ambulatory Visit: Payer: Self-pay

## 2021-02-10 DIAGNOSIS — R112 Nausea with vomiting, unspecified: Secondary | ICD-10-CM

## 2021-02-10 DIAGNOSIS — R197 Diarrhea, unspecified: Secondary | ICD-10-CM

## 2021-02-10 DIAGNOSIS — K529 Noninfective gastroenteritis and colitis, unspecified: Secondary | ICD-10-CM

## 2021-02-10 MED ORDER — ONDANSETRON 8 MG PO TBDP: 8.0000 mg | ORAL_TABLET | Freq: Three times a day (TID) | ORAL | 0 refills | Status: DC | PRN

## 2021-02-10 MED ORDER — LOPERAMIDE HCL 2 MG PO CAPS: 2.0000 mg | ORAL_CAPSULE | Freq: Two times a day (BID) | ORAL | 0 refills | Status: DC | PRN

## 2021-02-10 NOTE — Discharge Instructions (Signed)

## 2021-02-10 NOTE — ED Triage Notes (Signed)
Pt reports over the weekend the vomiting started with diarrhea.

## 2021-02-10 NOTE — ED Provider Notes (Signed)
[facility]   MRN: [id number] DOB: 06-Apr-1981  Subjective:   Natalie Fischer is a 40 y.o. female presenting for 3-day history of persistent nausea with vomiting, loose stools.  Patient states that she has had decreased appetite as well.  The diarrhea has improved but in the past 2 days has vomited about 8 times and feels like she is not able to hold any foods down.  Denies fever, hematemesis, cough, chest pain, shortness of breath, bloody stools.  No recent antibiotic use, hospitalizations, long distance travel.  No sick contacts.  Patient has a history of cannabis abuse but insists that she only smokes on occasion.  She also states that she drinks alcohol occasionally.   Current Facility-Administered Medications:  .  ARIPiprazole ER (ABILIFY MAINTENA) 400 MG prefilled syringe 400 mg, 400 mg, Intramuscular, Once, Shanna Cisco, NP  Current Outpatient Medications:  .  ARIPiprazole ER (ABILIFY MAINTENA) 400 MG PRSY prefilled syringe, Inject 400 mg into the muscle every 28 (twenty-eight) days., Disp: 1 each, Rfl: 11 .  buPROPion (WELLBUTRIN XL) 150 MG 24 hr tablet, Take 1 tablet (150 mg total) by mouth every morning., Disp: 30 tablet, Rfl: 2 .  hydrOXYzine (ATARAX/VISTARIL) 50 MG tablet, Take 1 tablet (50 mg total) by mouth 3 (three) times daily., Disp: 30 tablet, Rfl: 2 .  ibuprofen (ADVIL,MOTRIN) 200 MG tablet, Take 800 mg by mouth every 6 (six) hours as needed for headache or moderate pain., Disp: , Rfl:  .  ketoconazole (NIZORAL) 2 % cream, Apply 1 application topically 2 (two) times daily., Disp: 30 g, Rfl: 0 .  mirtazapine (REMERON) 30 MG tablet, Take 1 tablet (30 mg total) by mouth at bedtime. For depression, Disp: 30 tablet, Rfl: 2   Allergies  Allergen Reactions  . Depakene [Divalproex Sodium] Other (See Comments)    "made me feel like I was going to pass out"    Past Medical History:  Diagnosis Date  . Anxiety   . Bipolar 1 disorder (HCC)   . Carpal  tunnel syndrome of right wrist 2016  . Chlamydia   . Constipation   . Ectopic pregnancy 2008 and 2014   2008 required surgery, 2nd Misoprostol  . GERD (gastroesophageal reflux disease)   . Insomnia   . Trichomonas vaginitis   . UTI (lower urinary tract infection)      Past Surgical History:  Procedure Laterality Date  . ECTOPIC PREGNANCY SURGERY Right 2008    Family History  Problem Relation Age of Onset  . Cancer Sister 69  . Healthy Mother   . Healthy Father   . COPD Sister   . Alcohol abuse Neg Hx   . Arthritis Neg Hx   . Asthma Neg Hx   . Birth defects Neg Hx   . Depression Neg Hx   . Diabetes Neg Hx   . Drug abuse Neg Hx   . Early death Neg Hx   . Hearing loss Neg Hx   . Heart disease Neg Hx   . Hyperlipidemia Neg Hx   . Hypertension Neg Hx   . Kidney disease Neg Hx   . Learning disabilities Neg Hx   . Mental illness Neg Hx   . Mental retardation Neg Hx   . Miscarriages / Stillbirths Neg Hx   . Stroke Neg Hx   . Vision loss Neg Hx   . Varicose Veins Neg Hx     Social History   Tobacco Use  . Smoking status:  Current Some Day Smoker    Packs/day: 1.00    Types: Cigarettes    Start date: 05/15/1999  . Smokeless tobacco: Never Used  Vaping Use  . Vaping Use: Never used  Substance Use Topics  . Alcohol use: Yes    Comment: occasionally  . Drug use: Yes    Types: Cocaine    Comment: Last used June 2019    ROS   Objective:   Vitals: BP 132/82 (BP Location: Left Arm)   Pulse 71   Temp 97.9 F (36.6 C) (Oral)   Resp 18   SpO2 100%   Physical Exam Constitutional:      General: She is not in acute distress.    Appearance: Normal appearance. She is well-developed and normal weight. She is not ill-appearing, toxic-appearing or diaphoretic.  HENT:     Head: Normocephalic and atraumatic.     Right Ear: External ear normal.     Left Ear: External ear normal.     Nose: Nose normal.     Mouth/Throat:     Mouth: Mucous membranes are moist.      Pharynx: Oropharynx is clear.  Eyes:     General: No scleral icterus.    Extraocular Movements: Extraocular movements intact.     Pupils: Pupils are equal, round, and reactive to light.  Cardiovascular:     Rate and Rhythm: Normal rate and regular rhythm.     Pulses: Normal pulses.     Heart sounds: Normal heart sounds. No murmur heard. No friction rub. No gallop.   Pulmonary:     Effort: Pulmonary effort is normal. No respiratory distress.     Breath sounds: Normal breath sounds. No stridor. No wheezing, rhonchi or rales.  Abdominal:     General: Bowel sounds are normal. There is no distension.     Palpations: Abdomen is soft. There is no mass.     Tenderness: There is no abdominal tenderness. There is no right CVA tenderness, left CVA tenderness, guarding or rebound.  Skin:    General: Skin is warm and dry.     Coloration: Skin is not pale.     Findings: No rash.  Neurological:     General: No focal deficit present.     Mental Status: She is alert and oriented to person, place, and time.  Psychiatric:        Mood and Affect: Mood normal.        Behavior: Behavior normal.        Thought Content: Thought content normal.        Judgment: Judgment normal.      Assessment and Plan :   PDMP not reviewed this encounter.  1. Gastroenteritis   2. Nausea vomiting and diarrhea     Will manage for suspected viral gastroenteritis with supportive care.  Recommended patient hydrate well, eat light meals and maintain electrolytes.  Will use Zofran and Imodium for nausea, vomiting and diarrhea. Counseled patient on potential for adverse effects with medications prescribed/recommended today, ER and return-to-clinic precautions discussed, patient verbalized understanding.    Wallis Bamberg, PA-C 02/10/21 1626

## 2021-07-20 ENCOUNTER — Other Ambulatory Visit: Payer: Self-pay

## 2021-07-20 ENCOUNTER — Ambulatory Visit (INDEPENDENT_AMBULATORY_CARE_PROVIDER_SITE_OTHER): Payer: No Payment, Other | Admitting: Licensed Clinical Social Worker

## 2021-07-20 ENCOUNTER — Encounter (HOSPITAL_COMMUNITY): Payer: Self-pay | Admitting: Licensed Clinical Social Worker

## 2021-07-20 DIAGNOSIS — F3132 Bipolar disorder, current episode depressed, moderate: Secondary | ICD-10-CM | POA: Diagnosis not present
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
## 2021-07-20 NOTE — Plan of Care (Signed)
Pt agreeable to plan  ?

## 2021-08-09 ENCOUNTER — Ambulatory Visit (INDEPENDENT_AMBULATORY_CARE_PROVIDER_SITE_OTHER): Payer: No Payment, Other | Admitting: Licensed Clinical Social Worker

## 2021-08-09 ENCOUNTER — Other Ambulatory Visit: Payer: Self-pay

## 2021-08-09 DIAGNOSIS — F3132 Bipolar disorder, current episode depressed, moderate: Secondary | ICD-10-CM | POA: Diagnosis not present
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
## 2021-09-01 ENCOUNTER — Ambulatory Visit (HOSPITAL_COMMUNITY): Payer: No Payment, Other | Admitting: Licensed Clinical Social Worker

## 2021-09-02 ENCOUNTER — Ambulatory Visit (HOSPITAL_COMMUNITY): Payer: No Payment, Other | Admitting: Psychiatry

## 2021-09-06 ENCOUNTER — Encounter: Payer: Self-pay | Admitting: *Deleted

## 2021-09-06 NOTE — Congregational Nurse Program (Signed)
  Dept: [phone]   Congregational Nurse Program Note  Date of Encounter: 09/06/2021  Past Medical History: Past Medical History:  Diagnosis Date   Anxiety    Bipolar 1 disorder (HCC)    Carpal tunnel syndrome of right wrist 2016   Chlamydia    Constipation    Ectopic pregnancy 2008 and 2014   2008 required surgery, 2nd Misoprostol   GERD (gastroesophageal reflux disease)    Insomnia    Trichomonas vaginitis    UTI (lower urinary tract infection)     Encounter Details:  CNP Questionnaire - 09/06/21 1149       Questionnaire   Do you give verbal consent to treat you today? Yes    Location Patient Served  [facility]    Visit Setting Church or Organization    Patient Status Homeless    Insurance Uninsured ([place] [employer]/Self-Pay)    Insurance Referral N/A    Medication N/A    Medical Provider Yes    Screening Referrals N/A    Medical Referral Drug/Alcohol Services    Medical Appointment Made Other   Called [facility], ok to send client for assessment   Food N/A    Transportation Provided transportation assistance   Provided with [facility] CM   Intervention Support;Advocate    ED Visit Averted N/A    Life-Saving Intervention Made N/A            Client came to nurse's office requesting help with 1-2 week detox treatment. Client detoxing from heroin, cocaine and alcohol. Called [facility] and will send to [facility] location for assessment. Danea Manter W RN CN

## 2021-09-08 ENCOUNTER — Emergency Department (HOSPITAL_COMMUNITY)
Admission: EM | Admit: 2021-09-08 | Discharge: 2021-09-08 | Disposition: A | Discharge status: Home / Self Care | Payer: Medicaid Other | Attending: Emergency Medicine | Admitting: Emergency Medicine

## 2021-09-08 ENCOUNTER — Encounter (HOSPITAL_COMMUNITY): Payer: Self-pay

## 2021-09-08 DIAGNOSIS — F1413 Cocaine abuse, unspecified with withdrawal: Secondary | ICD-10-CM | POA: Insufficient documentation

## 2021-09-08 DIAGNOSIS — F1110 Opioid abuse, uncomplicated: Secondary | ICD-10-CM

## 2021-09-08 DIAGNOSIS — F17210 Nicotine dependence, cigarettes, uncomplicated: Secondary | ICD-10-CM | POA: Insufficient documentation

## 2021-09-08 DIAGNOSIS — F1113 Opioid abuse with withdrawal: Secondary | ICD-10-CM | POA: Insufficient documentation

## 2021-09-08 DIAGNOSIS — F1193 Opioid use, unspecified with withdrawal: Secondary | ICD-10-CM

## 2021-09-08 MED ORDER — DICYCLOMINE HCL 20 MG PO TABS: 20.0000 mg | ORAL_TABLET | Freq: Four times a day (QID) | ORAL | 0 refills | Status: DC | PRN

## 2021-09-08 MED ORDER — HYDROXYZINE HCL 25 MG PO TABS
25.0000 mg | ORAL_TABLET | Freq: Four times a day (QID) | ORAL | Status: DC | PRN
  Administered 2021-09-08: 25 mg via ORAL
  Filled 2021-09-08: qty 1

## 2021-09-08 MED ORDER — METHOCARBAMOL 500 MG PO TABS: 500.0000 mg | ORAL_TABLET | Freq: Three times a day (TID) | ORAL | 0 refills | Status: DC | PRN

## 2021-09-08 MED ORDER — HYDROXYZINE HCL 25 MG PO TABS: 25.0000 mg | ORAL_TABLET | Freq: Four times a day (QID) | ORAL | 0 refills | Status: DC | PRN

## 2021-09-08 MED ORDER — NAPROXEN 500 MG PO TABS
500.0000 mg | ORAL_TABLET | Freq: Two times a day (BID) | ORAL | Status: DC | PRN
  Administered 2021-09-08: 500 mg via ORAL
  Filled 2021-09-08: qty 1

## 2021-09-08 MED ORDER — LOPERAMIDE HCL 2 MG PO CAPS: 2.0000 mg | ORAL_CAPSULE | ORAL | Status: DC | PRN

## 2021-09-08 MED ORDER — METHOCARBAMOL 500 MG PO TABS
500.0000 mg | ORAL_TABLET | Freq: Three times a day (TID) | ORAL | Status: DC | PRN
  Administered 2021-09-08: 500 mg via ORAL
  Filled 2021-09-08: qty 1

## 2021-09-08 MED ORDER — ONDANSETRON 4 MG PO TBDP
4.0000 mg | ORAL_TABLET | Freq: Four times a day (QID) | ORAL | Status: DC | PRN
  Administered 2021-09-08: 4 mg via ORAL
  Filled 2021-09-08: qty 1

## 2021-09-08 MED ORDER — DICYCLOMINE HCL 20 MG PO TABS
20.0000 mg | ORAL_TABLET | Freq: Four times a day (QID) | ORAL | Status: DC | PRN
  Administered 2021-09-08: 20 mg via ORAL
  Filled 2021-09-08: qty 1

## 2021-09-08 MED ORDER — ONDANSETRON HCL 8 MG PO TABS: 8.0000 mg | ORAL_TABLET | Freq: Three times a day (TID) | ORAL | 0 refills | Status: DC | PRN

## 2021-09-08 NOTE — Discharge Instructions (Signed)
You are doing a good job!  You will probably have withdrawal symptoms another 2 or 3 days.  We sent some prescriptions to your pharmacy.  Additional medicines that can be helpful are naproxen twice a day for pain, and Imodium as needed for diarrhea.

## 2021-09-08 NOTE — ED Triage Notes (Signed)
Pt presents with c/o request for detox from heroin. Pt reports last use was Friday. Pt denies SI.

## 2021-09-08 NOTE — ED Provider Notes (Signed)
[facility]-EMERGENCY DEPT Provider Note   CSN: [id number] Arrival date & time: 09/08/21  1020     History Chief Complaint  Patient presents with   Detox    Natalie Fischer is a 40 y.o. female.  HPI She presents for evaluation of symptoms from opiate and cocaine withdrawal, last use 5 days ago.  She complains of abdominal cramping, vomiting, diarrhea, malaise, and weakness.  She is taking her usual medicines for psychiatric purposes.  She has no other known active modifying factors.    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/2016   Cocaine abuse (HCC) 01/22/2016   Bipolar 1 disorder, depressed, moderate (HCC) 02/20/2015   Cocaine use disorder, severe, dependence (HCC)    Cannabis use disorder, moderate, dependence (HCC)    ANEMIA 05/11/2010   LOSS OF WEIGHT 03/07/2008   ALLERGIC RHINITIS 01/18/2008   INSOMNIA 01/18/2008   ANXIETY 08/31/2007   GERD 06/18/2007   CONSTIPATION 06/18/2007    Past Surgical History:  Procedure Laterality Date   ECTOPIC PREGNANCY SURGERY Right 2008     OB History     Gravida  2   Para      Term      Preterm      AB  2   Living  0      SAB      IAB      Ectopic  2   Multiple      Live Births              Family History  Problem Relation Age of Onset   Cancer Sister 3   Healthy Mother    Healthy Father    COPD Sister    Alcohol abuse Neg Hx     Arthritis Neg Hx    Asthma Neg Hx    Birth defects Neg Hx    Depression Neg Hx    Diabetes Neg Hx    Drug abuse Neg Hx    Early death Neg Hx    Hearing loss Neg Hx    Heart disease Neg Hx    Hyperlipidemia Neg Hx    Hypertension Neg Hx    Kidney disease Neg Hx    Learning disabilities Neg Hx    Mental illness Neg Hx    Mental retardation Neg Hx    Miscarriages / Stillbirths Neg Hx    Stroke Neg Hx    Vision loss Neg Hx    Varicose Veins Neg Hx     Social History   Tobacco Use   Smoking status: Some Days    Packs/day: 0.50    Types: Cigarettes    Start date: 05/15/1999   Smokeless tobacco: Never  Vaping Use   Vaping Use: Never used  Substance Use Topics   Alcohol use: Yes    Comment: occasionally   Drug use: Yes  Types: Marijuana    Comment: Last used June 2019. Marijuana: On Saturday    Home Medications Prior to Admission medications   Medication Sig Start Date End Date Taking? Authorizing Provider  dicyclomine (BENTYL) 20 MG tablet Take 1 tablet (20 mg total) by mouth every 6 (six) hours as needed (Abdominal cramping). 09/08/21  Yes Mancel Bale, MD  hydrOXYzine (ATARAX/VISTARIL) 25 MG tablet Take 1 tablet (25 mg total) by mouth every 6 (six) hours as needed for anxiety or itching. 09/08/21  Yes Mancel Bale, MD  methocarbamol (ROBAXIN) 500 MG tablet Take 1 tablet (500 mg total) by mouth every 8 (eight) hours as needed for muscle spasms. 09/08/21  Yes Mancel Bale, MD  ondansetron (ZOFRAN) 8 MG tablet Take 1 tablet (8 mg total) by mouth every 8 (eight) hours as needed for nausea or vomiting. 09/08/21  Yes Mancel Bale, MD  ARIPiprazole ER (ABILIFY MAINTENA) 400 MG PRSY prefilled syringe Inject 400 mg into the muscle every 28 (twenty-eight) days. 06/30/20   Shanna Cisco, NP  buPROPion (WELLBUTRIN XL) 150 MG 24 hr tablet Take 1 tablet (150 mg total) by mouth every morning. 06/30/20   Shanna Cisco, NP  ibuprofen (ADVIL,MOTRIN) 200 MG tablet Take 800 mg  by mouth every 6 (six) hours as needed for headache or moderate pain.    [provider]  ketoconazole (NIZORAL) 2 % cream Apply 1 application topically 2 (two) times daily. 09/20/20   Elvina Sidle, MD  loperamide (IMODIUM) 2 MG capsule Take 1 capsule (2 mg total) by mouth 2 (two) times daily as needed for diarrhea or loose stools. 02/10/21   Wallis Bamberg, PA-C  mirtazapine (REMERON) 30 MG tablet Take 1 tablet (30 mg total) by mouth at bedtime. For depression 06/30/20   Shanna Cisco, NP  ondansetron (ZOFRAN-ODT) 8 MG disintegrating tablet Take 1 tablet (8 mg total) by mouth every 8 (eight) hours as needed for nausea or vomiting. 02/10/21   Wallis Bamberg, PA-C  pantoprazole (PROTONIX) 20 MG tablet Take 1 tablet (20 mg total) by mouth daily. For acid reflux Patient not taking: Reported on 01/02/2019 08/10/17 09/06/19  Armandina Stammer I, NP    Allergies    Depakene [divalproex sodium]  Review of Systems   Review of Systems  All other systems reviewed and are negative.  Physical Exam Updated Vital Signs BP (!) 133/104 (BP Location: Right Arm)   Pulse 99   Temp 98.4 F (36.9 C) (Oral)   Resp (!) 21   Ht 5\' 11"  (1.803 m)   Wt 54.4 kg   LMP 09/08/2021 (Approximate)   BMI 16.74 kg/m   Physical Exam Vitals and nursing note reviewed.  Constitutional:      General: She is not in acute distress.    Appearance: She is well-developed. She is not ill-appearing, toxic-appearing or diaphoretic.  HENT:     Head: Normocephalic and atraumatic.     Right Ear: External ear normal.     Left Ear: External ear normal.     Mouth/Throat:     Mouth: Mucous membranes are moist.  Eyes:     Conjunctiva/sclera: Conjunctivae normal.     Pupils: Pupils are equal, round, and reactive to light.  Neck:     Trachea: Phonation normal.  Cardiovascular:     Rate and Rhythm: Normal rate.  Pulmonary:     Effort: Pulmonary effort is normal.  Abdominal:     General: There is no distension.   Musculoskeletal:  General: Normal range of motion.     Cervical back: Normal range of motion and neck supple.  Skin:    General: Skin is warm and dry.  Neurological:     Mental Status: She is alert and oriented to person, place, and time.     Cranial Nerves: No cranial nerve deficit.     Sensory: No sensory deficit.     Motor: No abnormal muscle tone.     Coordination: Coordination normal.  Psychiatric:        Mood and Affect: Mood normal.        Behavior: Behavior normal.        Thought Content: Thought content normal.        Judgment: Judgment normal.    ED Results / Procedures / Treatments   Labs (all labs ordered are listed, but only abnormal results are displayed) Labs Reviewed - No data to display  EKG None  Radiology No results found.  Procedures Procedures   Medications Ordered in ED Medications  dicyclomine (BENTYL) tablet 20 mg (20 mg Oral Given 09/08/21 1115)  hydrOXYzine (ATARAX/VISTARIL) tablet 25 mg (25 mg Oral Given 09/08/21 1116)  loperamide (IMODIUM) capsule 2-4 mg (has no administration in time range)  methocarbamol (ROBAXIN) tablet 500 mg (500 mg Oral Given 09/08/21 1116)  naproxen (NAPROSYN) tablet 500 mg (500 mg Oral Given 09/08/21 1116)  ondansetron (ZOFRAN-ODT) disintegrating tablet 4 mg (4 mg Oral Given 09/08/21 1116)    ED Course  I have reviewed the triage vital signs and the nursing notes.  Pertinent labs & imaging results that were available during my care of the patient were reviewed by me and considered in my medical decision making (see chart for details).    MDM Rules/Calculators/A&P                            Patient Vitals for the past 24 hrs:  BP Temp Temp src Pulse Resp Height Weight  09/08/21 1044 (!) 133/104 98.4 F (36.9 C) Oral 99 (!) 21 5\' 11"  (1.803 m) 54.4 kg    12:22 PM Reevaluation with update and discussion. After initial assessment and treatment, an updated evaluation reveals she says she is feeling  better now and ready to go home.  Findings discussed with patient all questions were answered.  Will discharge with medication instructions and prescriptions.   Medical Decision Making:  This patient is presenting for evaluation of opiate withdrawal, which does require a range of treatment options, and is a complaint that involves a moderate risk of morbidity and mortality. The differential diagnoses include substance abuse, withdrawal from substance abuse, metabolic disorder, vascular disorder. I decided to review old records, and in summary millage female, with history of polysubstance abuse, known psychiatric disorder and ongoing symptoms of opiate withdrawal.  I did not require additional historical information from anyone.    Critical Interventions-clinical evaluation, initiation of medications to control symptoms of opiate withdrawal  After These Interventions, the Patient was reevaluated and was found stable for discharge.  Patient withdrawing from heroin, with mild symptoms, improved after treatment.  Stable for discharge.  Doubt serious bacterial infection, metabolic instability or impending vascular collapse  CRITICAL CARE-no Performed by: Mancel Bale  Nursing Notes Reviewed/ Care Coordinated Applicable Imaging Reviewed Interpretation of Laboratory Data incorporated into ED treatment  The patient appears reasonably screened and/or stabilized for discharge and I doubt any other medical condition or other [facility] requiring further screening,  evaluation, or treatment in the ED at this time prior to discharge.  Plan: Home Medications-OTC as needed; Home Treatments-continue to avoid narcotics and other illicit substances ,rest, fluids; return here if the recommended treatment, does not improve the symptoms; Recommended follow up-PCP, as needed     Final Clinical Impression(s) / ED Diagnoses Final diagnoses:  Narcotic abuse (HCC)  Opiate withdrawal (HCC)    Rx / DC  Orders ED Discharge Orders          Ordered    dicyclomine (BENTYL) 20 MG tablet  Every 6 hours PRN        09/08/21 1221    hydrOXYzine (ATARAX/VISTARIL) 25 MG tablet  Every 6 hours PRN        09/08/21 1221    methocarbamol (ROBAXIN) 500 MG tablet  Every 8 hours PRN        09/08/21 1221    ondansetron (ZOFRAN) 8 MG tablet  Every 8 hours PRN        09/08/21 1221             Mancel Bale, MD 09/08/21 1224

## 2021-09-20 ENCOUNTER — Ambulatory Visit (INDEPENDENT_AMBULATORY_CARE_PROVIDER_SITE_OTHER): Payer: No Payment, Other | Admitting: *Deleted

## 2021-09-20 ENCOUNTER — Encounter (HOSPITAL_COMMUNITY): Payer: Self-pay | Admitting: Student in an Organized Health Care Education/Training Program

## 2021-09-20 ENCOUNTER — Other Ambulatory Visit: Payer: Self-pay

## 2021-09-20 ENCOUNTER — Ambulatory Visit (INDEPENDENT_AMBULATORY_CARE_PROVIDER_SITE_OTHER): Payer: No Payment, Other | Admitting: Student in an Organized Health Care Education/Training Program

## 2021-09-20 ENCOUNTER — Encounter (HOSPITAL_COMMUNITY): Payer: Self-pay

## 2021-09-20 VITALS — BP 105/86 | HR 108 | Ht 71.0 in | Wt 117.0 lb

## 2021-09-20 DIAGNOSIS — F1920 Other psychoactive substance dependence, uncomplicated: Secondary | ICD-10-CM

## 2021-09-20 DIAGNOSIS — F3162 Bipolar disorder, current episode mixed, moderate: Secondary | ICD-10-CM

## 2021-09-20 MED ORDER — GABAPENTIN 300 MG PO CAPS
300.0000 mg | ORAL_CAPSULE | Freq: Every day | ORAL | 0 refills | Status: DC
  Filled 2021-09-20: qty 14, 14d supply, fill #0

## 2021-09-20 MED ORDER — ARIPIPRAZOLE ER 400 MG IM SRER
400.0000 mg | INTRAMUSCULAR | Status: DC
  Administered 2021-09-20 – 2022-05-05 (×8): 400 mg via INTRAMUSCULAR

## 2021-09-20 NOTE — Progress Notes (Signed)
PATIENT APPOINTMENT & WAS PLACED BACK ON ABILIFY MAINTENA 400 MG INJECTION. PROVIDER NOTICED SYMPTOMS &  BEHAVIORS & RESTARTED MEDICATION.  PATIENT TOLERATED INJECTION WELL IN Right Arm.  PATIENT VERY PLEASANT

## 2021-09-20 NOTE — Patient Instructions (Signed)
Please continue to take your oral Abilify for 14 days.    Possible Suboxone [facility] [address] [place], [place], [place] [address] [phone]  [facility] [address] [place], [place] [address]  [phone]  [facility], [address] [place], [place] [address] [phone]  [facility] Ground Floor - [facility], [address] [place], [place], [place] [address]  [phone]
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
## 2021-09-22 ENCOUNTER — Telehealth: Payer: Self-pay

## 2021-09-22 NOTE — Telephone Encounter (Signed)
Pt is requesting a call back .. she stated that she got our number from the behavioral health  to start OUD .Marland Kitchen pt stated that she is going through withdrawals at the moment  she did try to get help but the place she went to was not able to take her at the moment

## 2021-09-22 NOTE — Telephone Encounter (Signed)
OUD Intake:  Drug of choice: "Cocaine and heroin"  How long: 20 years  Other substances: "Weed, alcohol, xanax, synthetic weed, Percocets, anything to get high."  Currently on suboxone: No, has used subutex in past that "helped me get through the day." Understands only suboxone is prescribed at this clinic.  Interested in treatment: "Yes, very interested in treatment"  Around users.: No, has moved back to Mom and Dad's  Social support: "My family is very supportive when I let them."   Other: Patient last used cocaine 1 week ago, and used heroin last on 11/7. C/o "no energy, sweating, in pain, nausea." Went to [facility] for treatment, however, they admitted her boyfriend so would not accept her as they said it would be a conflict of interest. She was given gabapentin 300 mg to take daily. She has been taking 300 mg BID without relief.   States, "I want to get my life back, just need help."  Will forward to OUD Attending Pool for consideration of OUD appt. Kinnie Feil, BSN, RN-BC

## 2021-09-23 ENCOUNTER — Other Ambulatory Visit: Payer: Self-pay

## 2021-09-23 ENCOUNTER — Emergency Department (HOSPITAL_COMMUNITY)
Admission: EM | Admit: 2021-09-23 | Discharge: 2021-09-23 | Discharge status: Against Medical Advice | Payer: Medicaid Other | Attending: Emergency Medicine | Admitting: Emergency Medicine

## 2021-09-23 ENCOUNTER — Encounter (HOSPITAL_COMMUNITY): Payer: Self-pay

## 2021-09-23 DIAGNOSIS — M25512 Pain in left shoulder: Secondary | ICD-10-CM | POA: Insufficient documentation

## 2021-09-23 DIAGNOSIS — M545 Low back pain, unspecified: Secondary | ICD-10-CM | POA: Insufficient documentation

## 2021-09-23 DIAGNOSIS — F17210 Nicotine dependence, cigarettes, uncomplicated: Secondary | ICD-10-CM | POA: Insufficient documentation

## 2021-09-23 LAB — SEDIMENTATION RATE: Sed Rate: 8 mm/hr (ref 0–22)

## 2021-09-23 LAB — BASIC METABOLIC PANEL
Anion gap: 5 (ref 5–15)
BUN: 16 mg/dL (ref 6–20)
CO2: 27 mmol/L (ref 22–32)
Calcium: 9.1 mg/dL (ref 8.9–10.3)
Chloride: 103 mmol/L (ref 98–111)
Creatinine, Ser: 0.99 mg/dL (ref 0.44–1.00)
GFR, Estimated: >60 mL/min (ref >60)
Glucose, Bld: 124 mg/dL — ABNORMAL HIGH (ref 70–99)
Potassium: 4.1 mmol/L (ref 3.5–5.1)
Sodium: 135 mmol/L (ref 135–145)

## 2021-09-23 LAB — CBC WITH DIFFERENTIAL/PLATELET
Abs Immature Granulocytes: 0.02 10*3/uL (ref 0.00–0.07)
Basophils Absolute: 0 10*3/uL (ref 0.0–0.1)
Basophils Relative: 0 %
Eosinophils Absolute: 0.1 10*3/uL (ref 0.0–0.5)
Eosinophils Relative: 1 %
HCT: 36.7 % (ref 36.0–46.0)
Hemoglobin: 12 g/dL (ref 12.0–15.0)
Immature Granulocytes: 0 %
Lymphocytes Relative: 33 %
Lymphs Abs: 2 10*3/uL (ref 0.7–4.0)
MCH: 29.5 pg (ref 26.0–34.0)
MCHC: 32.7 g/dL (ref 30.0–36.0)
MCV: 90.2 fL (ref 80.0–100.0)
Monocytes Absolute: 0.6 10*3/uL (ref 0.1–1.0)
Monocytes Relative: 10 %
Neutro Abs: 3.3 10*3/uL (ref 1.7–7.7)
Neutrophils Relative %: 56 %
Platelets: 168 10*3/uL (ref 150–400)
RBC: 4.07 MIL/uL (ref 3.87–5.11)
RDW: 12.9 % (ref 11.5–15.5)
WBC: 6 10*3/uL (ref 4.0–10.5)
nRBC: 0 % (ref 0.0–0.2)
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
## 2021-09-23 NOTE — Telephone Encounter (Signed)
Patient scheduled for tomorrow with Dr. Barbaraann Faster. She is very Adult nurse.

## 2021-09-23 NOTE — ED Triage Notes (Signed)
Pt c/o ongoing left shoulder pain and lower back pain for several months. States the pain has worsened. No new injury.

## 2021-09-24 ENCOUNTER — Ambulatory Visit (INDEPENDENT_AMBULATORY_CARE_PROVIDER_SITE_OTHER): Payer: Self-pay | Admitting: Internal Medicine

## 2021-09-24 ENCOUNTER — Encounter: Payer: Self-pay | Admitting: Internal Medicine

## 2021-09-24 ENCOUNTER — Other Ambulatory Visit (HOSPITAL_COMMUNITY): Payer: Self-pay

## 2021-09-24 ENCOUNTER — Other Ambulatory Visit: Payer: Self-pay

## 2021-09-24 VITALS — BP 123/92 | HR 98 | Temp 98.2°F | Resp 28 | Ht 71.0 in | Wt 116.1 lb

## 2021-09-24 DIAGNOSIS — F1120 Opioid dependence, uncomplicated: Secondary | ICD-10-CM

## 2021-09-24 MED ORDER — BUPRENORPHINE HCL-NALOXONE HCL 8-2 MG SL SUBL
1.0000 | SUBLINGUAL_TABLET | Freq: Two times a day (BID) | SUBLINGUAL | 0 refills | Status: DC
Start: 2021-09-24 — End: 2021-10-05
  Filled 2021-09-24: qty 28, 14d supply, fill #0

## 2021-09-24 NOTE — Patient Instructions (Signed)
Instruction for starting buprenorphine-naloxone (Suboxone) at home  You should not mix buprenorphine-naloxone with other drugs especially large amounts of alcohol or benzodiazepines (Valium, Klonopin, Xanax, Ativan). If you have taken any of these medication, please tell your healthcare team and do not take buprenorphine-naloxone.   You must wait until you are feeling signs of withdrawal from opiates (heroin, pain pills) before you take buprenorphine-naloxone.  If you do not wait long enough the medication will make you sicker.  If you do take it too soon and get sicker then wait until later when you feel signs of withdrawal listed below and then try again.   Signs that you are withdrawing: Anxiety, restlessness, can't sit still Aches Nausea or sick to your stomach Goose-bumps Racing heart   You should have ALL of these symptoms before you start taking your first dose of buprenorphine-naloxone. If you are not sure call your healthcare team.    When it's time to take your first dose Split your pill or film in half Make sure your mouth is empty of everything (no candy/gum/etc) Sit or stand, but do not lie down Swallow a sip of water to wet your mouth  Put the half of the tablet or film under your tongue. Do not suck or swallow it. It must stay there until it is completely dissolved. Try to not even swallow your spit during this time. Anything that you swallow will not make you feel better.   In 20 minutes: You should start feeling a little better. If you feel worse then you started too early so you would wait a few hours and then try again later.    In one hour: You can take the other half of the pill or film the same way you took the first one.   In 2 hours: if you are still feeling symptoms of withdrawal listed above you can take another half a pill or film. You can repeat this if needed until you take a total of 2 pills or 2 films (16mg ). You may need less than this to control your  symptoms.  You should adjust your dose so that you are taking one and half or two pills or films per day (12-16mg  per day). At this dose you should have cut down on cravings and help with any withdrawal symptoms.   The next day:  In the morning you can take the same amount you took yesterday all at one time in the morning.  Expect a call from your team to see how you are doing.   If you have any questions or concerns at any time call your healthcare team.  Clinic Number:  830am - 5pm: [phone] After Hours Number: [phone] - - Leave your number and expect a call back from a physician.

## 2021-09-24 NOTE — Progress Notes (Signed)
09/25/2021  Natalie Fischer presents for buprenorphine/naloxone intake visit.   I have reviewed EPIC data including labwork which was available.  I have reviewed outside records provided by patient if available.  The salient points were confirmed with the patient.    Review of substance use history (first use, substances used, any illicit purchases): Patient started using marijuana at age 41, but did not like this substance. She started using cocaine at age 15. She started snorting heroine and cocaine at age 37. At age 80 she starting inhaling heroine only.  Last substance used: Last used Cocaine on 09/20/21.   If last substance not an opioid, last opioid used (type, dose, route, withdrawal symptoms): Last snorted Cocaine/Heroine on 09/18/21   Mental Health History: Bipolar 1 ,Anxiety  Current counseling/behavioural health provider: Eliseo Gum, MD  This patient has Opioid Use Disorder by following DSM-V criteria: Keep those that apply.  - Opioids taken in larger amounts or over a longer period than intended - Persistent desire to cut down - A great deal of time is spent to obtain/use/recover from the opioid - Cravings to use opioids - Use resulting in a failure to fulfill major role obligations - Continue opioid use despite persistent social or interpersonal problems - Important activities are given up or reduced because of opioid use - Recurrent opioid use in situations in which it is physically hazardous - Use despite knowledge of health problems caused by opioids - Tolerance - Withdrawal  Past Medical History:  Diagnosis Date   Anxiety    Bipolar 1 disorder (HCC)    Carpal tunnel syndrome of right wrist 2016   Chlamydia    Constipation    Ectopic pregnancy 2008 and 2014   2008 required surgery, 2nd Misoprostol   GERD (gastroesophageal reflux disease)    Insomnia    Trichomonas vaginitis    UTI (lower urinary tract infection)     Current Outpatient Medications on  File Prior to Visit  Medication Sig Dispense Refill   ARIPiprazole ER (ABILIFY MAINTENA) 400 MG PRSY prefilled syringe Inject 400 mg into the muscle every 28 (twenty-eight) days. 1 each 11   gabapentin (NEURONTIN) 300 MG capsule Take 1 capsule (300 mg total) by mouth at bedtime. 14 capsule 0   hydrOXYzine (ATARAX/VISTARIL) 25 MG tablet Take 1 tablet (25 mg total) by mouth every 6 (six) hours as needed for anxiety or itching. 20 tablet 0   ibuprofen (ADVIL,MOTRIN) 200 MG tablet Take 800 mg by mouth every 6 (six) hours as needed for headache or moderate pain.     mirtazapine (REMERON) 30 MG tablet Take 1 tablet (30 mg total) by mouth at bedtime. For depression 30 tablet 2   [DISCONTINUED] pantoprazole (PROTONIX) 20 MG tablet Take 1 tablet (20 mg total) by mouth daily. For acid reflux (Patient not taking: Reported on 01/02/2019) 15 tablet 0   Current Facility-Administered Medications on File Prior to Visit  Medication Dose Route Frequency Provider Last Rate Last Admin   ARIPiprazole ER (ABILIFY MAINTENA) 400 MG prefilled syringe 400 mg  400 mg Intramuscular Once Toy Cookey E, NP       ARIPiprazole ER (ABILIFY MAINTENA) injection 400 mg  400 mg Intramuscular Q28 days Eliseo Gum B, MD   400 mg at 09/20/21 1600    Physical Exam  Vitals:   09/24/21 1050  BP: (!) 123/92  Pulse: 98  Resp: (!) 28  Temp: 98.2 F (36.8 C)  TempSrc: Oral  SpO2: 100%  Weight: 116 lb  1.6 oz (52.7 kg)  Height: 5\' 11"  (1.803 m)    Clinical Opiate Withdrawal Scale: bold applicable COWS scoring   - Resting HR:    - 0 for < 80   - 1 for 81 - 100   - 2 for 101 - 120   - 4 for > 120  - Sweating:   - 0 for no chills/flushing   - 1 for subjective chills/flushing   - 3 for beads of sweat on brow/face   - 4 for sweat streaming off of face  - Restlessness:    - 0 for able to sit still   - 1 for subjective difficulty sitting still   - 3 for frequent shifting or extraneous movement   - 5 for unable to sit  still for more than a few seconds  - Pupil size:    - 0 for pinpoint or normal   - 1 for possibly larger than normal   - 2 for moderately dilated   - 5 for only iris rim visible  - Bone/joint pain:    - 0 for not present   - 1 for mild diffuse discomfort   - 2 severe diffuse aching   - 4 for objectively rubbing joints/muscles and obviously in pain  - Runny nose/tearing:    - 0 for not present   - 1 for stuffy nose/moist eyes   - 2 for nose running/tearing   - 4 for nose constantly running or tears streaming down cheeks  - GI Upset:    - 0 for no GI symptoms   - 1 for stomach cramps   - 2 for nausea or loose stool   - 3 for vomiting or diarrhea   - 5 for multiple episodes of vomiting or diarrhea  - Tremor observation of outstretched hands:    - 0 for no tremor   - 1 for tremor can be felt but not observed   - 2 for slight tremor observable   - 4 for gross tremor or muscle twitching  - Yawning:    - 0 for no yawning   - 1 for yawning once or twice during assessment   - 2 for yawning three or more times during assessment   - 4 for yawning several times per minute  - Anxiety or irritability:    - 0 for none   - 1 for patient reports increasing irritability or anxiousness   - 2 for patient obviously irritable/anxious   - 4 for patient so irritable/anxious that assessment is difficult  - Gooseflesh:    - 0 for skin is smooth   - 3 for piloerection of skin can be felt or seen   - 5 for prominent piloerection  TOTAL: 13  Assessment/Plan:   Based on a review of the patient's medical history including substance use and mental health factors, and physical exam, Natalie Fischer is a suitable candidate for MAT with buprenorphine/naloxone.  UDS not ordered this visit as patient provided detailed history of recent substance use.    I have discussed HIV and Hepatitis C screening with this patient.  Labs ordered  I will place orders for CMET for baseline LFT evaluation.  Home  Induction:   I have instructed the patient how to appropriately take this medication, including placing under the tongue with head relaxed for 10 minutes and allowing to dissolve without chewing or swallowing tab/film, and with nothing to eat or drink in the subsequent 15 minutes.  They have been told not to start taking the medication until they have significant signs of withdrawal and I have explained the concept of precipitated withdrawal with the patient.    Intervisit Care:  We discussed this medication must be kept in a safe place and away from children.   We will see the patient back in 2 weeks in clinic.   Patient was encouraged to call the office and speak with the MD on call for any urgent concerns.    Albertha Ghee, MD 09/25/2021 10:54 AM

## 2021-09-25 ENCOUNTER — Encounter: Payer: Self-pay | Admitting: Internal Medicine

## 2021-09-25 DIAGNOSIS — F1120 Opioid dependence, uncomplicated: Secondary | ICD-10-CM | POA: Insufficient documentation

## 2021-09-25 LAB — HEPATITIS C ANTIBODY: Hep C Virus Ab: <0.1 s/co ratio (ref 0.0–0.9)

## 2021-09-25 LAB — CMP14 + ANION GAP
ALT: 13 IU/L (ref 0–32)
AST: 17 IU/L (ref 0–40)
Albumin/Globulin Ratio: 1.8 (ref 1.2–2.2)
Albumin: 4.6 g/dL (ref 3.8–4.8)
Alkaline Phosphatase: 63 IU/L (ref 44–121)
Anion Gap: 19 mmol/L — ABNORMAL HIGH (ref 10.0–18.0)
BUN/Creatinine Ratio: 14 (ref 9–23)
BUN: 14 mg/dL (ref 6–24)
Bilirubin Total: 0.2 mg/dL (ref 0.0–1.2)
CO2: 20 mmol/L (ref 20–29)
Calcium: 9.4 mg/dL (ref 8.7–10.2)
Chloride: 104 mmol/L (ref 96–106)
Creatinine, Ser: 0.99 mg/dL (ref 0.57–1.00)
Globulin, Total: 2.5 g/dL (ref 1.5–4.5)
Glucose: 80 mg/dL (ref 70–99)
Potassium: 4.4 mmol/L (ref 3.5–5.2)
Sodium: 143 mmol/L (ref 134–144)
Total Protein: 7.1 g/dL (ref 6.0–8.5)
eGFR: 74 mL/min/{1.73_m2} (ref >59)

## 2021-09-25 LAB — HIV ANTIBODY (ROUTINE TESTING W REFLEX): HIV Screen 4th Generation wRfx: NONREACTIVE

## 2021-09-25 NOTE — Assessment & Plan Note (Signed)
Natalie Fischer is a 8 old living with Bipolar Type 1, Anxiety, and OUD who presents for induction of suboxone on 09/24/2021. She started using heroine/cocaine at age 40. At age 40 she started inhaling heroine only. No IV Drug use history. She has had stable jobs in the past and lost them due to substance use. Reports she was a good employee, so given chance to resign and not fired. She is currently employed and has been restarted on psychiatric medication recently. She says reently scared by finding out she tested positive for Fentanyl and this was her sign to get help.   Assessment/Plan: Severe opioid use disorder (HCC) - CMP14 + Anion Gap - Hepatitis C antibody - HIV antibody (with reflex) - buprenorphine-naloxone (SUBOXONE) 8-2 mg SUBL SL tablet; Place 1 tablet under the tongue 2 (two) times daily.  Dispense: 28 tablet; Refill: 0  Addendum: Hep C neg, HIV neg, LFT's wnl .

## 2021-09-27 NOTE — Progress Notes (Signed)
Internal Medicine Clinic Attending ° °Case discussed with Dr. Steen  at the time of the visit.  We reviewed the resident’s history and exam and pertinent patient test results.  I agree with the assessment, diagnosis, and plan of care documented in the resident’s note.  °

## 2021-09-27 NOTE — Addendum Note (Signed)
Addended by: Burnell Blanks on: 09/27/2021 09:34 AM   Modules accepted: Level of Service

## 2021-09-28 ENCOUNTER — Encounter (HOSPITAL_COMMUNITY): Payer: Self-pay

## 2021-09-28 ENCOUNTER — Ambulatory Visit (HOSPITAL_COMMUNITY): Payer: No Payment, Other | Admitting: Licensed Clinical Social Worker

## 2021-10-05 ENCOUNTER — Encounter: Payer: Self-pay | Admitting: Student

## 2021-10-05 ENCOUNTER — Other Ambulatory Visit: Payer: Self-pay

## 2021-10-05 ENCOUNTER — Other Ambulatory Visit (HOSPITAL_COMMUNITY): Payer: Self-pay

## 2021-10-05 ENCOUNTER — Ambulatory Visit (INDEPENDENT_AMBULATORY_CARE_PROVIDER_SITE_OTHER): Payer: Self-pay | Admitting: Student

## 2021-10-05 VITALS — BP 119/70 | HR 83 | Temp 97.7°F | Ht 71.0 in | Wt 127.3 lb

## 2021-10-05 DIAGNOSIS — F1120 Opioid dependence, uncomplicated: Secondary | ICD-10-CM

## 2021-10-05 DIAGNOSIS — K5900 Constipation, unspecified: Secondary | ICD-10-CM

## 2021-10-05 MED ORDER — BUPRENORPHINE HCL-NALOXONE HCL 8-2 MG SL SUBL
1.0000 | SUBLINGUAL_TABLET | Freq: Two times a day (BID) | SUBLINGUAL | 0 refills | Status: DC
  Filled 2021-10-05 – 2021-10-14 (×2): qty 28, 14d supply, fill #0

## 2021-10-05 NOTE — Assessment & Plan Note (Addendum)
Patient endorses chronic constipation.  Reports 3 bowel movements a week.  She is taking MiraLAX once or twice a week.  Also admits to poor fluid intake.  Suboxone can contribute to her constipation as well  -Advised patient to increase MiraLAX frequency -Continue adequate hydration with fibers intake

## 2021-10-05 NOTE — Progress Notes (Signed)
° °  Dejanira Mustin presents for follow up of opioid use disorder I have reviewed the prior induction visit, follow up visits, and telephone encounters relevant to opiate use disorder (OUD) treatment.    Current daily dose: suboxone 8-2mg  tablet SL BID   Date of Induction: 09/24/2021   Current follow up interval, in weeks: 2 weeks   The patient has been adherent with the buprenorphine for OUD contract.    Last UDS Result:   HPI:  Ms.Loreley L Houdek is a 40 y.o. with past medical history of bipolar, polysubstance use disorder who presented to the clinic for OUD follow-up.  Please see problem based charting for detail  Past Medical History:  Diagnosis Date   Anxiety    Bipolar 1 disorder (HCC)    Carpal tunnel syndrome of right wrist 2016   Chlamydia    Constipation    Ectopic pregnancy 2008 and 2014   2008 required surgery, 2nd Misoprostol   GERD (gastroesophageal reflux disease)    Insomnia    Trichomonas vaginitis    UTI (lower urinary tract infection)    Review of Systems:  per HPI  Physical Exam:  Vitals:   10/05/21 1006  BP: 119/70  Pulse: 83  Temp: 97.7 F (36.5 C)  TempSrc: Oral  SpO2: 100%  Weight: 127 lb 4.8 oz (57.7 kg)  Height: 5\' 11"  (1.803 m)   Physical Exam Constitutional:      General: She is not in acute distress.    Appearance: She is not ill-appearing.  HENT:     Head: Normocephalic.  Eyes:     General:        Right eye: No discharge.        Left eye: No discharge.     Conjunctiva/sclera: Conjunctivae normal.  Cardiovascular:     Rate and Rhythm: Normal rate and regular rhythm.  Pulmonary:     Effort: Pulmonary effort is normal. No respiratory distress.     Breath sounds: Normal breath sounds. No wheezing.  Musculoskeletal:        General: Normal range of motion.     Comments: Slight tremors seen bilateral hands, right worse than left  Neurological:     General: No focal deficit present.     Mental Status: She is alert and oriented  to person, place, and time.  Psychiatric:        Mood and Affect: Mood normal.        Behavior: Behavior normal.     Assessment & Plan:   See Encounters Tab for problem based charting.  Patient discussed with Dr. 

## 2021-10-05 NOTE — Assessment & Plan Note (Signed)
Patient was started on Suboxone 2 weeks ago.  Patient however is taking half a tablet twice daily instead of 1 tablet twice daily.  She states that the Suboxone helps reduce her craving but she still have some mild symptoms such as tremors.  She denies other opioid product use or IV drug use.    She reports 1 episode of cocaine relapse last week but has not used anything since.  -Advised patient to take Suboxone 8-2 mg 1 tablet twice daily -U tox today -Advised patient to cut back on other substances  -Follow-up in 2 weeks

## 2021-10-05 NOTE — Patient Instructions (Signed)
Natalie Fischer,  It was a pleasure seeing you in the clinic today.  I am glad that you are doing well.  Please take 1 tablet sublingual twice daily.  Please cut back on other substance use like cocaine.  Per our pain contract, no other substance should be seen in your system.  I will see her back in 2 weeks  Take care,  Dr. Cyndie Chime

## 2021-10-14 ENCOUNTER — Encounter (HOSPITAL_COMMUNITY): Payer: Self-pay

## 2021-10-14 ENCOUNTER — Other Ambulatory Visit (HOSPITAL_COMMUNITY): Payer: Self-pay

## 2021-10-14 ENCOUNTER — Ambulatory Visit (INDEPENDENT_AMBULATORY_CARE_PROVIDER_SITE_OTHER): Payer: No Payment, Other | Admitting: *Deleted

## 2021-10-14 ENCOUNTER — Other Ambulatory Visit: Payer: Self-pay

## 2021-10-14 VITALS — BP 115/79 | HR 88 | Ht 71.0 in | Wt 128.0 lb

## 2021-10-14 DIAGNOSIS — F3162 Bipolar disorder, current episode mixed, moderate: Secondary | ICD-10-CM

## 2021-10-14 NOTE — Progress Notes (Signed)
Patient arrived for injection / abilify 400mg . Tolerated injection Well in Right Gluteal.  Patient was feeling a little anxious due to holiday season stress. But stated she's feeling a lot better today. Patient pleasant as always.

## 2021-10-15 LAB — TOXASSURE SELECT,+ANTIDEPR,UR

## 2021-10-19 ENCOUNTER — Encounter: Payer: Medicaid Other | Admitting: Internal Medicine

## 2021-10-20 ENCOUNTER — Other Ambulatory Visit (HOSPITAL_COMMUNITY): Payer: Self-pay

## 2021-10-20 ENCOUNTER — Ambulatory Visit (INDEPENDENT_AMBULATORY_CARE_PROVIDER_SITE_OTHER): Payer: Self-pay | Admitting: Internal Medicine

## 2021-10-20 DIAGNOSIS — F1120 Opioid dependence, uncomplicated: Secondary | ICD-10-CM

## 2021-10-20 MED ORDER — BUPRENORPHINE HCL-NALOXONE HCL 8-2 MG SL SUBL
1.0000 | SUBLINGUAL_TABLET | Freq: Two times a day (BID) | SUBLINGUAL | 0 refills | Status: DC
  Filled 2021-10-20 – 2021-11-01 (×2): qty 28, 14d supply, fill #0

## 2021-10-20 NOTE — Assessment & Plan Note (Signed)
Natalie Fischer states that she has been doing very well since her last appointment 2 weeks ago.  She is taking Suboxone 1 mg twice daily without any difficulty.  She feels that this increased dose has completely reduced her cravings.  She no longer has any tremors.  We discussed again her relapse with cocaine that was prior to the visit 2 weeks ago.  She has not had any additional relapses since that visit and is feeling very good regarding her sobriety.  Natalie Fischer states that she is committed to remaining sober.  She notes that she did drink some alcohol prior to her last tox assure but notes that she has never had any alcohol use disorder.  She drinks only seldomly during celebratory events.  Assessment/plan: Significantly improved control with increased dose of Suboxone.  We will maintain on current dose and follow-up in 2 weeks.  - Suboxone 8-2 mg 1 tablet twice daily, refill sent today - 2-week follow-up in person - UDS at next visit

## 2021-10-20 NOTE — Progress Notes (Signed)
Internal Medicine Clinic Attending  Case discussed with Dr. Nguyen  At the time of the visit.  We reviewed the resident's history and exam and pertinent patient test results.  I agree with the assessment, diagnosis, and plan of care documented in the resident's note. 

## 2021-10-20 NOTE — Progress Notes (Signed)
°  [facility] Internal Medicine Residency Telephone Encounter Continuity Care Appointment  HPI:  This telephone encounter was created for [name] on 10/20/2021 for the following purpose/cc OUD follow up.   Past Medical History:  Past Medical History:  Diagnosis Date   Anxiety    Bipolar 1 disorder (HCC)    Carpal tunnel syndrome of right wrist 2016   Chlamydia    Constipation    Ectopic pregnancy 2008 and 2014   2008 required surgery, 2nd Misoprostol   GERD (gastroesophageal reflux disease)    Insomnia    Trichomonas vaginitis    UTI (lower urinary tract infection)      ROS:  + constipation    Assessment / Plan / Recommendations:  Please see A&P under problem oriented charting for assessment of the patient's acute and chronic medical conditions.  As always, pt is advised that if symptoms worsen or new symptoms arise, they should go to an urgent care facility or to to ER for further evaluation.   Consent and Medical Decision Making:  Patient discussed with Dr. Criselda Peaches This is a telephone encounter between Avon Gully and Verdene Lennert on 10/20/2021 for OUD follow up. The visit was conducted with the patient located at home and Verdene Lennert at [facility]. The patient's identity was confirmed using their DOB and current address. The patient has consented to being evaluated through a telephone encounter and understands the associated risks (an examination cannot be done and the patient may need to come in for an appointment) / benefits (allows the patient to remain at home, decreasing exposure to coronavirus). I personally spent 10 minutes on medical discussion.

## 2021-10-25 NOTE — Progress Notes (Signed)
Internal Medicine Clinic Attending  Case discussed with Dr. Basaraba  at the time of the visit.  We reviewed the resident's history and pertinent patient test results.  I agree with the assessment, diagnosis, and plan of care documented in the resident's note.  

## 2021-11-01 ENCOUNTER — Other Ambulatory Visit (HOSPITAL_COMMUNITY): Payer: Self-pay

## 2021-11-09 ENCOUNTER — Ambulatory Visit (INDEPENDENT_AMBULATORY_CARE_PROVIDER_SITE_OTHER): Payer: Self-pay | Admitting: Student in an Organized Health Care Education/Training Program

## 2021-11-09 ENCOUNTER — Other Ambulatory Visit (HOSPITAL_COMMUNITY): Payer: Self-pay

## 2021-11-09 ENCOUNTER — Other Ambulatory Visit: Payer: Self-pay

## 2021-11-09 VITALS — BP 101/65 | HR 80 | Temp 98.8°F | Wt 127.1 lb

## 2021-11-09 DIAGNOSIS — F1120 Opioid dependence, uncomplicated: Secondary | ICD-10-CM

## 2021-11-09 MED ORDER — SENNA 8.6 MG PO TABS
1.0000 | ORAL_TABLET | Freq: Every day | ORAL | 1 refills | Status: AC
  Filled 2021-11-09 – 2022-06-28 (×2): qty 30, 30d supply, fill #0

## 2021-11-09 MED ORDER — BUPRENORPHINE HCL-NALOXONE HCL 8-2 MG SL SUBL
1.0000 | SUBLINGUAL_TABLET | Freq: Two times a day (BID) | SUBLINGUAL | 1 refills | Status: AC
  Filled 2021-11-09: qty 28, 14d supply, fill #0

## 2021-11-09 NOTE — Progress Notes (Signed)
° °  Assessment and Plan:  See Encounters tab for problem-based medical decision making.   __________________________________________________________  HPI:   41 year old person living OUD and bipolar disease here for follow up of OBOT. Doing really well, good adherence. Reports cravings are well controlled. Denies any relapses. Was tempted by people bringing drugs around her. But not seeking out these experiences. She is working full time. Mood is stable on abilify through [facility]. She does have some constipation as a chronic issue, no abdominal pain or nausea.   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/23/2016   Bipolar 1 disorder, depressed, moderate (HCC) 02/20/2015   Cocaine use disorder, severe, dependence (HCC)    Cannabis use disorder, moderate, dependence (HCC)    Constipation 06/18/2007    Medications: Reconciled today in Epic __________________________________________________________  Physical Exam:  Vital Signs: Vitals:   11/09/21 0911  BP: 101/65  Pulse: 80  Temp: 98.8 F (37.1 C)  TempSrc: Oral  SpO2: 100%  Weight: 127 lb 1.6 oz (57.7 kg)    Gen: Well appearing, NAD CV: RRR, no murmurs Pulm: Normal effort, CTA throughout, no wheezing Abd: Soft, NT Ext: Warm, no edema, normal joints

## 2021-11-09 NOTE — Assessment & Plan Note (Signed)
Doing well on treatment for about two months. Reports good adherence, having some constipation, but not bad. Database review is appropriate. Will get toxassure today to confirm adherence. Will continue with suboxone 8mg  bid. I sent a one month supply to the pharmacy. Will also start Senna for opioid-induced constipation.

## 2021-11-11 ENCOUNTER — Encounter (HOSPITAL_COMMUNITY): Payer: Self-pay

## 2021-11-11 ENCOUNTER — Other Ambulatory Visit: Payer: Self-pay

## 2021-11-11 ENCOUNTER — Ambulatory Visit (INDEPENDENT_AMBULATORY_CARE_PROVIDER_SITE_OTHER): Payer: No Payment, Other | Admitting: *Deleted

## 2021-11-11 DIAGNOSIS — F3162 Bipolar disorder, current episode mixed, moderate: Secondary | ICD-10-CM

## 2021-11-11 DIAGNOSIS — F3132 Bipolar disorder, current episode depressed, moderate: Secondary | ICD-10-CM

## 2021-11-11 NOTE — Progress Notes (Signed)
Patient arrived for her injection ARIPiprazole ER (ABILIFY MAINTENA) injection 400 mg. 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 Also stated that she's happy to have finally have a medication that helps her focus & think about one thing  & not lots of things  @ one time.  Patient pleasant as Always  & Tolerated injection well in Left-Gluteal.

## 2021-11-12 LAB — TOXASSURE SELECT,+ANTIDEPR,UR

## 2021-11-15 ENCOUNTER — Encounter (HOSPITAL_COMMUNITY): Payer: No Payment, Other | Admitting: Student in an Organized Health Care Education/Training Program

## 2021-11-25 ENCOUNTER — Ambulatory Visit (HOSPITAL_COMMUNITY): Payer: No Payment, Other | Admitting: Clinical

## 2021-12-09 ENCOUNTER — Encounter (HOSPITAL_COMMUNITY): Payer: Self-pay

## 2021-12-09 ENCOUNTER — Ambulatory Visit (INDEPENDENT_AMBULATORY_CARE_PROVIDER_SITE_OTHER): Payer: No Payment, Other | Admitting: *Deleted

## 2021-12-09 ENCOUNTER — Other Ambulatory Visit: Payer: Self-pay

## 2021-12-09 VITALS — BP 106/63 | HR 96 | Ht 71.0 in | Wt 125.0 lb

## 2021-12-09 DIAGNOSIS — F3132 Bipolar disorder, current episode depressed, moderate: Secondary | ICD-10-CM

## 2021-12-09 DIAGNOSIS — F3162 Bipolar disorder, current episode mixed, moderate: Secondary | ICD-10-CM

## 2021-12-09 NOTE — Progress Notes (Signed)
Patient arrived for injection ARIPiprazole ER (ABILIFY MAINTENA) injection 400 mg. Tolerated injection we in Right-Gluteal. Pleasant as Always.

## 2022-01-03 ENCOUNTER — Encounter (HOSPITAL_COMMUNITY): Payer: Self-pay | Admitting: Student in an Organized Health Care Education/Training Program

## 2022-01-03 ENCOUNTER — Ambulatory Visit (INDEPENDENT_AMBULATORY_CARE_PROVIDER_SITE_OTHER): Payer: No Payment, Other | Admitting: Student in an Organized Health Care Education/Training Program

## 2022-01-03 ENCOUNTER — Other Ambulatory Visit: Payer: Self-pay

## 2022-01-03 VITALS — BP 115/89 | HR 94

## 2022-01-03 DIAGNOSIS — F3162 Bipolar disorder, current episode mixed, moderate: Secondary | ICD-10-CM | POA: Diagnosis not present

## 2022-01-03 DIAGNOSIS — F3132 Bipolar disorder, current episode depressed, moderate: Secondary | ICD-10-CM

## 2022-01-03 MED ORDER — ARIPIPRAZOLE ER 400 MG IM PRSY: 400.0000 mg | PREFILLED_SYRINGE | INTRAMUSCULAR | 11 refills | Status: DC

## 2022-01-03 MED ORDER — OXCARBAZEPINE 150 MG PO TABS: 150.0000 mg | ORAL_TABLET | Freq: Two times a day (BID) | ORAL | 2 refills | Status: DC

## 2022-01-03 NOTE — Progress Notes (Signed)
BH MD/PA/NP OP Progress Note  01/03/2022 4:31 PM Natalie LYDICK  MRN:  [id number]  Chief Complaint:  Chief Complaint  Patient presents with   Medication Management    F/U MM   HPI: Natalie Fischer is a 41 year old female patient with a past psychiatric history of bipolar 1 disorder, polysubstance abuse, anxiety.    Provider has not seen patient since 08/2021; however patient has been attending [facility] and receiving her monthly injections. Staff let provider know that patient has appeared tearful and decompensated on more than on occasion. Staff recommended patient return for med mgmt visits.   On assessment today patient is approx 1 week away from her next LAI. Patient reports that she has been feeling more down the last week. Patient reports that she has noticed that her last 2 weeks of a month are more difficult and she begins to feel down and having increasing racing thoughts. Patient reports that she feels that they are signs that it is time for her next injection. On assessment today patient reports that she was dysphoric over the weekend and does not recall any specific triggers. Patient reports that her friends noticed that the patient appeared down and poorer concentration. Patient reports that her appetite has also significantly decreased. Patient endorses that she has also been feeling more hopeless lately. Patient reports that she also feels that her thoughts are racing "a little bit" and endorses that at the moment the thoughts are related to the things going on in her life. Patient reports that despite this she is sleeping very well. Patient denies SI, HI, or AVH.  Patient reports that she really  likes being on Abilify Maintena and does not want to change meds. Patient reports that she feels she would not be compliant if she were to be transitioned back to PO Abilify.Patient reports that she stopped taking Remeron because she felt the medication made her sleepy throughout the day and  she also felt that her sleep had significantly improved.  Patient endorsed that she has a poor compliance hx w/ PO medications.  Visit Diagnosis:    ICD-10-CM   1. Bipolar 1 disorder, mixed, moderate (HCC)  F31.62 OXcarbazepine (TRILEPTAL) 150 MG tablet    2. Bipolar 1 disorder, depressed, moderate (HCC)  F31.32 ARIPiprazole ER (ABILIFY MAINTENA) 400 MG PRSY prefilled syringe      Past Psychiatric History:  Patient was a patient at [facility] in the past.  Per EMR patient has had trials with Celexa, Latuda, Depakote.  Per patient she recalls Depakote feeling as if it was over sedating.  Patient reports that she has done best on Abilify Maintena, Remeron 7.5 nightly and hydroxyzine 25 mg 3 times daily.  Patient endorses that she has been hospitalized at [facility] at least 2 times with the last time being in 2017.  Patient reports that she attempted suicide as a teenager and her mother was aware after the attempt.   08/2021: On Abilify Maintena 400mg  and Remeron 7.5mg  [facility]  Past Medical History:  Past Medical History:  Diagnosis Date   Anxiety    Bipolar 1 disorder ([place])    Carpal tunnel syndrome of right wrist 2016   Chlamydia    Constipation    Ectopic pregnancy 2008 and 2014   2008 required surgery, 2nd Misoprostol   GERD (gastroesophageal reflux disease)    Insomnia    Trichomonas vaginitis    UTI (lower urinary tract infection)     Past Surgical History:  Procedure Laterality Date  ECTOPIC PREGNANCY SURGERY Right 2008    Family Psychiatric History: Sister: Depression/attempted suicide in past, Paternal aunt: Bipolar-attempted suicide in past  Family History:  Family History  Problem Relation Age of Onset   Cancer Sister 78   Healthy Mother    Healthy Father    COPD Sister    Alcohol abuse Neg Hx    Arthritis Neg Hx    Asthma Neg Hx    Birth defects Neg Hx    Depression Neg Hx    Diabetes Neg Hx    Drug abuse Neg Hx    Early death Neg Hx    Hearing loss Neg Hx    Heart  disease Neg Hx    Hyperlipidemia Neg Hx    Hypertension Neg Hx    Kidney disease Neg Hx    Learning disabilities Neg Hx    Mental illness Neg Hx    Mental retardation Neg Hx    Miscarriages / Stillbirths Neg Hx    Stroke Neg Hx    Vision loss Neg Hx    Varicose Veins Neg Hx     Social History:  Social History   Socioeconomic History   Marital status: Single    Spouse name: Not on file   Number of children: 0   Years of education: college-1   Highest education level: Not on file  Occupational History   Occupation: unemployed    Comment: Previously worked [employer], Therapist, art.  Tobacco Use   Smoking status: Some Days    Packs/day: 0.50    Types: Cigarettes    Start date: 05/15/1999   Smokeless tobacco: Never   Tobacco comments:    0.5 PPD  Vaping Use   Vaping Use: Never used  Substance and Sexual Activity   Alcohol use: Yes    Comment: occasionally   Drug use: Not Currently    Comment: Last used June 2019. Marijuana: On Saturday   Sexual activity: Yes    Partners: Male    Birth control/protection: Condom    Comment: 1 partner  Other Topics Concern   Not on file  Social History Narrative   Born in [place], [place] to [place], in 1998 when her mother got a job in one of the mills here.   Now living with her father.  Sometimes with her sister.   Parents separated.   Social Determinants of Health   Financial Resource Strain: High Risk   Difficulty of Paying Living Expenses: Very hard  Food Insecurity: Landscape architect Present   Worried About Charity fundraiser in the Last Year: Often true   Arboriculturist in the Last Year: Often true  Transportation Needs: Public librarian (Medical): Yes   Lack of Transportation (Non-Medical): Yes  Physical Activity: Inactive   Days of Exercise per Week: 0 days   Minutes of Exercise per Session: 0 min  Stress: Stress Concern Present   Feeling of Stress :  Very much  Social Connections: Socially Isolated   Frequency of Communication with Friends and Family: Once a week   Frequency of Social Gatherings with Friends and Family: Never   Attends Religious Services: 1 to 4 times per year   Active Member of [employer] or Organizations: No   Attends Archivist Meetings: Never   Marital Status: Never married    Allergies:  Allergies  Allergen Reactions   Depakene [Divalproex Sodium] Other (See Comments)    "  made me feel like I was going to pass out"    Metabolic Disorder Labs: Lab Results  Component Value Date   HGBA1C 6.0 (H) 02/21/2015   MPG 126 02/21/2015   No results found for: PROLACTIN Lab Results  Component Value Date   CHOL 138 02/21/2015   TRIG 60 02/21/2015   HDL 51 02/21/2015   CHOLHDL 2.7 02/21/2015   VLDL 12 02/21/2015   LDLCALC 75 02/21/2015   LDLCALC 86 04/03/2009   Lab Results  Component Value Date   TSH 0.299 (L) 02/20/2015   TSH 0.519 05/10/2010    Therapeutic Level Labs: No results found for: LITHIUM No results found for: VALPROATE No components found for:  CBMZ  Current Medications: Current Outpatient Medications  Medication Sig Dispense Refill   buprenorphine-naloxone (SUBOXONE) 8-2 mg SUBL SL tablet Place 1 tablet under the tongue 2 (two) times daily. 28 tablet 1   gabapentin (NEURONTIN) 300 MG capsule Take 1 capsule (300 mg total) by mouth at bedtime. 14 capsule 0   hydrOXYzine (ATARAX/VISTARIL) 25 MG tablet Take 1 tablet (25 mg total) by mouth every 6 (six) hours as needed for anxiety or itching. 20 tablet 0   ibuprofen (ADVIL,MOTRIN) 200 MG tablet Take 800 mg by mouth every 6 (six) hours as needed for headache or moderate pain.     mirtazapine (REMERON) 30 MG tablet Take 1 tablet (30 mg total) by mouth at bedtime. For depression 30 tablet 2   OXcarbazepine (TRILEPTAL) 150 MG tablet Take 1 tablet (150 mg total) by mouth 2 (two) times daily. 60 tablet 2   senna (SENOKOT) 8.6 MG TABS tablet Take  1 tablet (8.6 mg total) by mouth daily. 30 tablet 1   ARIPiprazole ER (ABILIFY MAINTENA) 400 MG PRSY prefilled syringe Inject 400 mg into the muscle every 28 (twenty-eight) days. 1 each 11   Current Facility-Administered Medications  Medication Dose Route Frequency Provider Last Rate Last Admin   ARIPiprazole ER (ABILIFY MAINTENA) 400 MG prefilled syringe 400 mg  400 mg Intramuscular Once Toy Cookey E, NP       ARIPiprazole ER (ABILIFY MAINTENA) injection 400 mg  400 mg Intramuscular Q28 days Eliseo Gum B, MD   400 mg at 12/09/21 1400     Musculoskeletal: Strength & Muscle Tone: within normal limits Gait & Station: normal Patient leans: N/A  Psychiatric Specialty Exam: Review of Systems  Psychiatric/Behavioral:  Positive for dysphoric mood. Negative for hallucinations and suicidal ideas.    Blood pressure 115/89, pulse 94.There is no height or weight on file to calculate BMI.  General Appearance: Casual and Purple themed outfit  Eye Contact:  Good  Speech:  Clear and Coherent  Volume:  Normal  Mood:  Euthymic  Affect:  Appropriate  Thought Process:  Coherent  Orientation:  Full (Time, Place, and Person)  Thought Content: Logical   Suicidal Thoughts:  No  Homicidal Thoughts:  No  Memory:  Immediate;   Good Recent;   Good  Judgement:  Fair  Insight:  Fair  Psychomotor Activity:  Normal  Concentration:  Concentration: Fair  Recall:  NA  Fund of Knowledge: Good  Language: Good  Akathisia:  No    AIMS (if indicated): not done  Assets:  Communication Skills Desire for Improvement Housing Resilience  ADL's:  Intact  Cognition: WNL  Sleep:  Good   Screenings: AIMS    Flowsheet Row [facility] Ad[facility] (Discharged) from 01/22/2016 in [facility]  [place] [facility] Admission (Discharged) from 02/20/2015 in [place] [facility]  AIMS Total Score 0 0 0       AUDIT    Flowsheet Row Counselor from 07/20/2021 in [facility] Admission (Discharged) from 08/07/2017 in [place] [facility] Admission (Discharged) from 01/22/2016 in [place] [facility] Admission (Discharged) from 02/20/2015 in [employer] [facility]  Alcohol Use Disorder Identification Test Final Score (AUDIT) 7 3 18 1       GAD-7    Flowsheet Row Office Visit from 11/09/2021 in [place] from 08/09/2021 in [facility] Counselor from 07/20/2021 in [facility]  Total GAD-7 Score 7 13 18       PHQ2-9    [place] Office Visit from 11/09/2021 in [place] Office Visit from 10/05/2021 in [place] Office Visit from 09/24/2021 in [place] from 08/09/2021 in [facility] Counselor from 07/20/2021 in [facility]  PHQ-2 Total Score 2 3 6 4 6   PHQ-9 Total Score 9 11 27 21 24       Flowsheet Row ED from 09/23/2021 in [place] DEPT ED from 09/08/2021 in [place] [facility]PT Counselor from 08/09/2021 in [place] No Risk No Risk Moderate Risk        Assessment and Plan: Patient is a 41 year old female with a known past psychiatric history of bipolar disorder versus schizoaffective disorder. Patient is no longer endorsing psychotic symptoms and they appear to be controlled with Abilify. Patient does continue to endorse mood destabilization and appears to have decompensation 2 weeks from her LAI. Unfortunately , patient has a hx of poor PO compliance and has insight to recognize and admit this; thus lamictal is not ideal. Patient has Depakote allergy.  Patient will be tried on Trileptal to help stabilize her mood. If patient is able to compliant with trileptal but has minimial improvement may try lamictal in the future. Patient continues to present with mixed pattern; although less severe than on initial assessment. At this time patient is more bothered by the depress symptoms; however on assessment patient is endorsing both hypomanic and depressed symptoms.  Bipolar disorder type I, current episode mixed Patient received Abilify Maintena 400 mg today at 09/20/2021 via sample available in clinic.  Next dose will be due approximately 10/18/2021. - Continue Abilify Maintena 400 mg q. 28 days - Start trileptal 150mg  BID - Follow-up in approximately 4 weeks  Polysubstance use disorder - Currently in [facility] subxone clinic ( 8-2mg  BID)   PGY-2 Freida Busman, MD 01/03/2022, 4:31 PM

## 2022-01-05 ENCOUNTER — Telehealth (HOSPITAL_COMMUNITY): Payer: Self-pay | Admitting: Student in an Organized Health Care Education/Training Program

## 2022-01-05 ENCOUNTER — Other Ambulatory Visit: Payer: Self-pay | Admitting: Student in an Organized Health Care Education/Training Program

## 2022-01-05 DIAGNOSIS — F3162 Bipolar disorder, current episode mixed, moderate: Secondary | ICD-10-CM

## 2022-01-05 MED ORDER — OXCARBAZEPINE 150 MG PO TABS: 150.0000 mg | ORAL_TABLET | Freq: Two times a day (BID) | ORAL | 2 refills | Status: DC

## 2022-01-05 NOTE — Progress Notes (Signed)
Sent Trileptal to [facility], since medication was not able to filled at [facility] Dept ?

## 2022-01-05 NOTE — Telephone Encounter (Signed)
Patient is requesting for Trileptal, to be sent to [facility] on [address]. [facility] Dept does not carry this medication. ?

## 2022-01-06 ENCOUNTER — Other Ambulatory Visit: Payer: Self-pay

## 2022-01-06 ENCOUNTER — Other Ambulatory Visit: Payer: Self-pay | Admitting: Student in an Organized Health Care Education/Training Program

## 2022-01-06 ENCOUNTER — Ambulatory Visit (HOSPITAL_COMMUNITY): Payer: No Payment, Other | Admitting: *Deleted

## 2022-01-06 ENCOUNTER — Encounter (HOSPITAL_COMMUNITY): Payer: Self-pay

## 2022-01-06 VITALS — BP 108/75 | HR 92 | Ht 71.0 in | Wt 125.0 lb

## 2022-01-06 MED ORDER — OXCARBAZEPINE 150 MG PO TABS
150.0000 mg | ORAL_TABLET | Freq: Two times a day (BID) | ORAL | 2 refills | Status: DC
  Filled 2022-01-06: qty 60, 30d supply, fill #0

## 2022-01-06 NOTE — Progress Notes (Signed)
Trileptal Rx not filled at [facility] (called to confirm) , moved to [facility] at patient request. ?

## 2022-01-06 NOTE — Progress Notes (Signed)
In for shot, states she is feeling well, she just got back from a long weekend with her sisters in the [place]. She has a good personal appearance, she is pleasant and verbal and offers no complaints. Injection of Abilify M 400 mg given in her L GlUTEAL muscle without issue. She reports she did not pick up her trileptal because it wasn't at the [facility]. Will notify Gerlean Ren MD to move it to her preferred pharmacy. She is to return in one month for her next injection.  ?

## 2022-01-07 ENCOUNTER — Other Ambulatory Visit: Payer: Self-pay

## 2022-01-18 ENCOUNTER — Telehealth (HOSPITAL_COMMUNITY): Payer: Self-pay | Admitting: Student in an Organized Health Care Education/Training Program
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
## 2022-01-18 NOTE — Telephone Encounter (Signed)
Patient request return call about medications she is taking currently.

## 2022-02-03 ENCOUNTER — Ambulatory Visit (INDEPENDENT_AMBULATORY_CARE_PROVIDER_SITE_OTHER): Payer: No Payment, Other | Admitting: *Deleted

## 2022-02-03 ENCOUNTER — Encounter (HOSPITAL_COMMUNITY): Payer: Self-pay

## 2022-02-03 VITALS — BP 105/63 | HR 88 | Ht 71.0 in | Wt 127.0 lb

## 2022-02-03 DIAGNOSIS — F3162 Bipolar disorder, current episode mixed, moderate: Secondary | ICD-10-CM | POA: Diagnosis not present

## 2022-02-03 NOTE — Progress Notes (Signed)
In as schedule for her monthly injection of Abilify M 400 mg given today in her R GLUTEAL without issue. 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 She has a nice personal appearance and is pleasant and appropriate. She offers no complaints. She continues to live with her sister. She is to return in one month for her next shot.  ?

## 2022-02-09 DIAGNOSIS — Z1231 Encounter for screening mammogram for malignant neoplasm of breast: Secondary | ICD-10-CM

## 2022-02-21 ENCOUNTER — Encounter (HOSPITAL_COMMUNITY): Payer: Self-pay

## 2022-02-21 ENCOUNTER — Encounter (HOSPITAL_COMMUNITY): Payer: No Payment, Other | Admitting: Student in an Organized Health Care Education/Training Program

## 2022-03-03 ENCOUNTER — Ambulatory Visit (HOSPITAL_COMMUNITY): Payer: No Payment, Other

## 2022-03-07 ENCOUNTER — Encounter (HOSPITAL_COMMUNITY): Payer: Self-pay

## 2022-03-07 ENCOUNTER — Ambulatory Visit (INDEPENDENT_AMBULATORY_CARE_PROVIDER_SITE_OTHER): Payer: No Payment, Other | Admitting: *Deleted

## 2022-03-07 ENCOUNTER — Other Ambulatory Visit: Payer: Self-pay

## 2022-03-07 ENCOUNTER — Telehealth (HOSPITAL_COMMUNITY): Payer: Self-pay | Admitting: *Deleted

## 2022-03-07 ENCOUNTER — Ambulatory Visit (INDEPENDENT_AMBULATORY_CARE_PROVIDER_SITE_OTHER): Payer: No Payment, Other | Admitting: Student in an Organized Health Care Education/Training Program

## 2022-03-07 DIAGNOSIS — F3162 Bipolar disorder, current episode mixed, moderate: Secondary | ICD-10-CM

## 2022-03-07 DIAGNOSIS — F3132 Bipolar disorder, current episode depressed, moderate: Secondary | ICD-10-CM

## 2022-03-07 MED ORDER — ARIPIPRAZOLE 2 MG PO TABS
2.0000 mg | ORAL_TABLET | Freq: Every day | ORAL | 2 refills | Status: DC
  Filled 2022-03-07: qty 30, 30d supply, fill #0

## 2022-03-07 MED ORDER — ARIPIPRAZOLE ER 400 MG IM PRSY
400.0000 mg | PREFILLED_SYRINGE | INTRAMUSCULAR | 11 refills | Status: DC
  Filled 2022-03-07: qty 1, 28d supply, fill #0

## 2022-03-07 MED ORDER — OXCARBAZEPINE 150 MG PO TABS
150.0000 mg | ORAL_TABLET | Freq: Two times a day (BID) | ORAL | 2 refills | Status: DC
  Filled 2022-03-07 – 2022-03-14 (×2): qty 60, 30d supply, fill #0

## 2022-03-07 NOTE — Progress Notes (Signed)
Patient arrived for injection ARIPiprazole ER (ABILIFY MAINTENA) 400 MG . ?Pleasant as Always Tolerated Injection Well in Left Gluteal ?

## 2022-03-07 NOTE — Telephone Encounter (Signed)
Patient arrived for injection ARIPiprazole ER (ABILIFY MAINTENA) 400 MG . ?Pleasant as Always Tolerated Injection Well in Left Gluteal ?

## 2022-03-07 NOTE — Progress Notes (Addendum)
BH MD/PA/NP OP Progress Note  03/07/2022 4:00 PM Natalie Fischer  MRN:  [id number]  Chief Complaint: No chief complaint on file.  HPI:  Natalie Fischer is a 41 year old female patient with a past psychiatric history of bipolar 1 disorder, polysubstance abuse, anxiety.  Patient has been compliant with her Abilify Maintena and Trileptal. Patient reports that she does not feel like she needs the Remeron anymore,and has been sleeping well. Patient reports that she feels the Trileptal has been helping with her mood. Patient reports that she is working and is in a good position for housing assistance. Patient reports that the last month she has started seeing bugs on her skin. Patient reports that it is not interfering with her job or life, but is a bit concerned. Patient reports that this just happened out in the waiting room. Patient reports that she is also happy with her weight gain. Patient denies SI, HI and AH.  Patient reports she is using THC only 2x/ month and a glass of wine on the weekend.   Visit Diagnosis:    ICD-10-CM   1. Bipolar 1 disorder, depressed, moderate (HCC)  F31.32 ARIPiprazole (ABILIFY) 2 MG tablet    ARIPiprazole ER (ABILIFY MAINTENA) 400 MG PRSY prefilled syringe    2. Bipolar 1 disorder, mixed, moderate (HCC)  F31.62 OXcarbazepine (TRILEPTAL) 150 MG tablet      Past Psychiatric History:  Patient was a patient at [facility] in the past.  Per EMR patient has had trials with Celexa, Latuda, Depakote.  Per patient she recalls Depakote feeling as if it was over sedating.  Patient reports that she has done best on Abilify Maintena, Remeron 7.5 nightly and hydroxyzine 25 mg 3 times daily.  Patient endorses that she has been hospitalized at [facility] at least 2 times with the last time being in 2017.  Patient reports that she attempted suicide as a teenager and her mother was aware after the attempt.   08/2021: On Abilify Maintena  and Remeron 7.5mg  QHS  12/2020: Patient continue to  endorse mood destabilization and appears to have decompensation 2 weeks from her LAI. Patient started on Trileptal   BID.  Past Medical History:  Past Medical History:  Diagnosis Date   Anxiety    Bipolar 1 disorder (HCC)    Carpal tunnel syndrome of right wrist 2016   Chlamydia    Constipation    Ectopic pregnancy 2008 and 2014   2008 required surgery, 2nd Misoprostol   GERD (gastroesophageal reflux disease)    Insomnia    Trichomonas vaginitis    UTI (lower urinary tract infection)     Past Surgical History:  Procedure Laterality Date   ECTOPIC PREGNANCY SURGERY Right 2008    Family Psychiatric History:  Sister: Depression/attempted suicide in past, Paternal aunt: Bipolar-attempted suicide in past  Family History:  Family History  Problem Relation Age of Onset   Cancer Sister 66   Healthy Mother    Healthy Father    COPD Sister    Alcohol abuse Neg Hx    Arthritis Neg Hx    Asthma Neg Hx    Birth defects Neg Hx    Depression Neg Hx    Diabetes Neg Hx    Drug abuse Neg Hx    Early death Neg Hx    Hearing loss Neg Hx    Heart disease Neg Hx    Hyperlipidemia Neg Hx    Hypertension Neg Hx    Kidney disease Neg Hx  Learning disabilities Neg Hx    Mental illness Neg Hx    Mental retardation Neg Hx    Miscarriages / Stillbirths Neg Hx    Stroke Neg Hx    Vision loss Neg Hx    Varicose Veins Neg Hx     Social History:  Social History   Socioeconomic History   Marital status: Single    Spouse name: Not on file   Number of children: 0   Years of education: college-1   Highest education level: Not on file  Occupational History   Occupation: unemployed    Comment: Previously worked [employer], Clinical biochemistcustomer service.  Tobacco Use   Smoking status: Some Days    Packs/day: 0.50    Types: Cigarettes    Start date: 05/15/1999   Smokeless tobacco: Never   Tobacco comments:    0.5 PPD  Vaping Use   Vaping Use: Never used  Substance and Sexual Activity    Alcohol use: Yes    Comment: occasionally   Drug use: Not Currently    Comment: Last used June 2019. Marijuana: On Saturday   Sexual activity: Yes    Partners: Male    Birth control/protection: Condom    Comment: 1 partner  Other Topics Concern   Not on file  Social History Narrative   [place]   [place] when her mother got a job in one of the mills here.   Now living with her father.  Sometimes with her sister.   Parents separated.   Social Determinants of Health   Financial Resource Strain: High Risk   Difficulty of Paying Living Expenses: Very hard  Food Insecurity: Geophysicist/field seismologistood Insecurity Present   Worried About Programme researcher, broadcasting/film/videounning Out of Food in the Last Year: Often true   Baristaan Out of Food in the Last Year: Often true  Transportation Needs: Personal assistantUnmet Transportation Needs   Lack of Transportation (Medical): Yes   Lack of Transportation (Non-Medical): Yes  Physical Activity: Inactive   Days of Exercise per Week: 0 days   Minutes of Exercise per Session: 0 min  Stress: Stress Concern Present   Feeling of Stress : Very much  Social Connections: Socially Isolated   Frequency of Communication with Friends and Family: Once a week   Frequency of Social Gatherings with Friends and Family: Never   Attends Religious Services: 1 to 4 times per year   Active Member of [employer] Financial[employer]ubs or Organizations: No   Attends BankerClub or Organization Meetings: Never   Marital Status: Never married    Allergies:  Allergies  Allergen Reactions   Depakene [Divalproex Sodium] Other (See Comments)    "made me feel like I was going to pass out"    Metabolic Disorder Labs: Lab Results  Component Value Date   HGBA1C 6.0 (H) 02/21/2015   MPG 126 02/21/2015   No results found for: PROLACTIN Lab Results  Component Value Date   CHOL 138 02/21/2015   TRIG 60 02/21/2015   HDL 51 02/21/2015   CHOLHDL 2.7 02/21/2015   VLDL 12 02/21/2015   LDLCALC 75 02/21/2015   LDLCALC 86  04/03/2009   Lab Results  Component Value Date   TSH 0.299 (L) 02/20/2015   TSH 0.519 05/10/2010    Therapeutic Level Labs: No results found for: LITHIUM No results found for: VALPROATE No components found for:  CBMZ  Current Medications: Current Outpatient Medications  Medication Sig Dispense Refill   ARIPiprazole (ABILIFY) 2  MG tablet Take 1 tablet (2 mg total) by mouth daily. 30 tablet 2   ARIPiprazole ER (ABILIFY MAINTENA) 400 MG PRSY prefilled syringe Inject 400 mg into the muscle every 28 (twenty-eight) days. 1 each 11   buprenorphine-naloxone (SUBOXONE) 8-2 mg SUBL SL tablet Place 1 tablet under the tongue 2 (two) times daily. 28 tablet 1   gabapentin (NEURONTIN) 300 MG capsule Take 1 capsule (300 mg total) by mouth at bedtime. 14 capsule 0   hydrOXYzine (ATARAX/VISTARIL) 25 MG tablet Take 1 tablet (25 mg total) by mouth every 6 (six) hours as needed for anxiety or itching. 20 tablet 0   ibuprofen (ADVIL,MOTRIN) 200 MG tablet Take 800 mg by mouth every 6 (six) hours as needed for headache or moderate pain.     OXcarbazepine (TRILEPTAL) 150 MG tablet Take 1 tablet (150 mg total) by mouth 2 (two) times daily. 60 tablet 2   senna (SENOKOT) 8.6 MG TABS tablet Take 1 tablet (8.6 mg total) by mouth daily. 30 tablet 1   Current Facility-Administered Medications  Medication Dose Route Frequency Provider Last Rate Last Admin   ARIPiprazole ER (ABILIFY MAINTENA) 400 MG prefilled syringe 400 mg  400 mg Intramuscular Once Toy Cookey E, NP       ARIPiprazole ER (ABILIFY MAINTENA) injection 400 mg  400 mg Intramuscular Q28 days Eliseo Gum B, MD   400 mg at 02/03/22 1415     Musculoskeletal: Strength & Muscle Tone: within normal limits Gait & Station: normal Patient leans: N/A  Psychiatric Specialty Exam: Review of Systems  There were no vitals taken for this visit.There is no height or weight on file to calculate BMI.  General Appearance: Casual wearing purple  Eye  Contact:  Good  Speech:  Clear and Coherent  Volume:  Normal  Mood:  Euthymic  Affect:  Appropriate and Congruent  Thought Process:  Coherent  Orientation:  Full (Time, Place, and Person)  Thought Content: Logical   Suicidal Thoughts:  No  Homicidal Thoughts:  No  Memory:  Immediate;   Fair  Judgement:  Fair  Insight:  Fair  Psychomotor Activity:  Normal  Concentration:  Concentration: Fair  Recall:  Good  Fund of Knowledge: Good  Language: Good  Akathisia:  Negative    AIMS (if indicated): not done  Assets:  Communication Skills Resilience  ADL's:  Intact  Cognition: WNL  Sleep:  Good   Screenings: AIMS    Flowsheet Row [facility]) [facility] Ad[facility] ([facility]) [facility]  AIMS Total Score 0 0 0      AUDIT    Flowsheet Row Counselor from 07/20/2021 in [facility] Ad[facility] (Discharged) [facility] (Dis[facility]) [facility] ([facility]  Alcohol Use Disorder Identification Test Final Score (AUDIT) 7 3 18 1       GAD-7    Flowsheet Row Office Visit from 11/09/2021 in [facility] Counselor from 08/09/2021 in [facility] Counselor from 07/20/2021 in [facility]  Total GAD-7 Score 7 13 18       PHQ2-9    Flowsheet Row Office Visit from 11/09/2021 in [facility] Office Visit from 10/05/2021 in [facility] Office Visit from 09/24/2021 in [facility] Counselor from 08/09/2021 in [facility] Counselor from 07/20/2021 in [facility]  PHQ-2 Total Score 2 3 6 4 6   PHQ-9 Total Score 9 11 27 21 24       Flowsheet Row ED from 09/23/2021 in [facility] DEPT ED from 09/08/2021 in [facility] Counselor from 08/09/2021 in [facility]  C-SSRS RISK CATEGORY No Risk No Risk Moderate Risk        Assessment and Plan:  Patient is a 41 year old female with a known past psychiatric history of bipolar disorder versus schizoaffective disorder.  Patient endorses that her mood is significantly improved, however she concerned that she is seeing previous hallucinations of bugs crawling on her skin.  Patient endorses that these hallucinations are not significantly impacting her life however she is a little concerned.  Patient and provider discussed that patient appears to do fairly well with Abilify and has done well regarding compliance with p.o. Trileptal.  Would like to trial patient with p.o. Abilify while continuing her LAI.  Patient remains very nervous that she would rapidly decompensate if she did not have LAI.  Patient and provider both agree that continuing LAI not having p.o. dose is most beneficial for patient.  Patient insight continues to improve.  Patient will be receiving Abilify Maintena 400 mg today.   Bipolar disorder type I, current episode mixed - Continue Abilify Maintena 400 mg q. 28 days - Continue trileptal 150mg  BID -Discontinue Remeron, patient endorses no benefit - Follow-up in approximately 4 weeks, patient has been instructed to go to the ED if she begins to experience any TD like symptoms.  Polysubstance use disorder - Currently in [facility] subxone clinic where she see's 41, NP ( 8-2mg  BID), no longer with IMTS  PGY-2 , MD 03/07/2022, 4:00 PM

## 2022-03-14 ENCOUNTER — Other Ambulatory Visit: Payer: Self-pay

## 2022-04-04 ENCOUNTER — Encounter (HOSPITAL_COMMUNITY): Payer: Self-pay

## 2022-04-04 ENCOUNTER — Ambulatory Visit (INDEPENDENT_AMBULATORY_CARE_PROVIDER_SITE_OTHER): Payer: No Payment, Other | Admitting: *Deleted

## 2022-04-04 ENCOUNTER — Encounter (HOSPITAL_COMMUNITY): Payer: Self-pay | Admitting: Student in an Organized Health Care Education/Training Program

## 2022-04-04 VITALS — BP 127/90 | HR 86 | Ht 71.0 in | Wt 133.0 lb

## 2022-04-04 DIAGNOSIS — F3162 Bipolar disorder, current episode mixed, moderate: Secondary | ICD-10-CM | POA: Diagnosis not present

## 2022-04-04 DIAGNOSIS — F3132 Bipolar disorder, current episode depressed, moderate: Secondary | ICD-10-CM

## 2022-04-04 NOTE — Progress Notes (Signed)
Patient arrived for ARIPiprazole ER (ABILIFY MAINTENA) injection 400 mg Tolerated  Injection Well In Left Gluteal Pleas as Always. NO HI/SI NOR AH/VH

## 2022-04-11 ENCOUNTER — Encounter (HOSPITAL_COMMUNITY): Payer: Self-pay | Admitting: Student in an Organized Health Care Education/Training Program

## 2022-04-11 ENCOUNTER — Encounter (HOSPITAL_COMMUNITY): Payer: Self-pay

## 2022-05-02 ENCOUNTER — Ambulatory Visit (HOSPITAL_COMMUNITY): Payer: Self-pay | Admitting: Student in an Organized Health Care Education/Training Program

## 2022-05-02 ENCOUNTER — Ambulatory Visit (HOSPITAL_COMMUNITY): Payer: No Payment, Other

## 2022-05-05 ENCOUNTER — Encounter (HOSPITAL_COMMUNITY): Payer: Self-pay | Admitting: Student in an Organized Health Care Education/Training Program

## 2022-05-05 ENCOUNTER — Ambulatory Visit (INDEPENDENT_AMBULATORY_CARE_PROVIDER_SITE_OTHER): Payer: No Payment, Other | Admitting: *Deleted

## 2022-05-05 ENCOUNTER — Ambulatory Visit (HOSPITAL_COMMUNITY): Payer: Self-pay

## 2022-05-05 ENCOUNTER — Ambulatory Visit (INDEPENDENT_AMBULATORY_CARE_PROVIDER_SITE_OTHER): Payer: No Payment, Other | Admitting: Student in an Organized Health Care Education/Training Program

## 2022-05-05 ENCOUNTER — Other Ambulatory Visit: Payer: Self-pay

## 2022-05-05 ENCOUNTER — Encounter (HOSPITAL_COMMUNITY): Payer: Self-pay

## 2022-05-05 VITALS — BP 111/77 | HR 87 | Ht 71.0 in | Wt 130.0 lb

## 2022-05-05 DIAGNOSIS — F3132 Bipolar disorder, current episode depressed, moderate: Secondary | ICD-10-CM

## 2022-05-05 DIAGNOSIS — F3162 Bipolar disorder, current episode mixed, moderate: Secondary | ICD-10-CM

## 2022-05-05 MED ORDER — ABILIFY ASIMTUFII 960 MG/3.2ML IM PRSY
960.0000 mg | PREFILLED_SYRINGE | INTRAMUSCULAR | 1 refills | Status: DC
  Filled 2022-05-05: qty 3.2, fill #0
  Filled 2022-06-28: qty 3.2, 56d supply, fill #0

## 2022-05-05 MED ORDER — OXCARBAZEPINE 150 MG PO TABS
150.0000 mg | ORAL_TABLET | Freq: Two times a day (BID) | ORAL | 2 refills | Status: DC
  Filled 2022-05-05: qty 60, 30d supply, fill #0
  Filled 2022-06-28 – 2022-06-29 (×2): qty 60, 30d supply, fill #1

## 2022-05-05 MED ORDER — ARIPIPRAZOLE 2 MG PO TABS
2.0000 mg | ORAL_TABLET | Freq: Every day | ORAL | 2 refills | Status: DC
  Filled 2022-05-05: qty 30, 30d supply, fill #0
  Filled 2022-06-28 – 2022-06-29 (×2): qty 30, 30d supply, fill #1

## 2022-05-05 NOTE — Progress Notes (Signed)
Seen initially by Nadara Mustard MD for her assessment. She is doing well today and offers no complaints. Her Abilify M 400 mg injection was given today in her R GLUTEAL muscle without issue. Discussed the idea with her of changing to a new abilify that is every 2 months and she is open to it. Will discuss with Dr Gerlean Ren. She is to return in one month for next inj and two months to see Dr Gerlean Ren.

## 2022-05-05 NOTE — Progress Notes (Signed)
BH MD/PA/NP OP Progress Note  05/05/2022 10:44 AM Natalie Fischer  MRN:  [id number]  Chief Complaint: No chief complaint on file.  HPI: Natalie Fischer is a 41 year old female patient with a past psychiatric history of bipolar 1 disorder, polysubstance abuse, anxiety.  Patient continues to be compliant with all of her medications including pills and LAI.  Patient reports she has been doing fairly well since her last appointment.  Patient reports that she continues to work at [employer] and is getting along well with others.  Patient reports she currently has a place to live however she is experiencing a bit of stress that she is trying to get her own place.  Patient reports she is still having some racing thoughts and endorses that this is more so towards the end of each month.  Patient reports however that her racing thoughts are not severely debilitating.  Patient denies SI, HI and AVH.  Patient reports that her appetite has been fairly stable and that she has quit smoking but unfortunately she has switched to vague being instead of cigarettes.  Patient reports she is only drinking EtOH socially which is on rare occasions.  Otherwise patient reports she is sleeping well and feels stable on her current medication regimen. Visit Diagnosis:    ICD-10-CM   1. Bipolar 1 disorder, depressed, moderate (HCC)  F31.32     2. Bipolar 1 disorder, mixed, moderate (HCC)  F31.62       Past Psychiatric History: Patient was a patient at [facility] in the past.  Per EMR patient has had trials with Celexa, Latuda, Depakote.  Per patient she recalls Depakote feeling as if it was over sedating.  Patient reports that she has done best on Abilify Maintena, Remeron 7.5 nightly and hydroxyzine 25 mg 3 times daily.  Patient endorses that she has been hospitalized at [facility] at least 2 times with the last time being in 2017.  Patient reports that she attempted suicide as a teenager and her mother was aware after the attempt.    08/2021: On Abilify Maintena 400mg  and Remeron 7.5mg  QHS   12/2020: Patient continue to endorse mood destabilization and appears to have decompensation 2 weeks from her LAI. Patient started on Trileptal  150mg  BID.  02/2022: Patient appears to be stable on Trileptal 150 mg twice daily, Abilify 2 mg daily, Abilify Maintena 400 mg LAI  Past Medical History:  Past Medical History:  Diagnosis Date  . Anxiety   . Bipolar 1 disorder (HCC)   . Carpal tunnel syndrome of right wrist 2016  . Chlamydia   . Constipation   . Ectopic pregnancy 2008 and 2014   2008 required surgery, 2nd Misoprostol  . GERD (gastroesophageal reflux disease)   . Insomnia   . Trichomonas vaginitis   . UTI (lower urinary tract infection)     Past Surgical History:  Procedure Laterality Date  . ECTOPIC PREGNANCY SURGERY Right 2008    Family Psychiatric History: Sister: Depression/attempted suicide in past, Paternal aunt: Bipolar-attempted suicide in past  Family History:  Family History  Problem Relation Age of Onset  . Cancer Sister 15  . Healthy Mother   . Healthy Father   . COPD Sister   . Alcohol abuse Neg Hx   . Arthritis Neg Hx   . Asthma Neg Hx   . Birth defects Neg Hx   . Depression Neg Hx   . Diabetes Neg Hx   . Drug abuse Neg Hx   . Early death  Neg Hx   . Hearing loss Neg Hx   . Heart disease Neg Hx   . Hyperlipidemia Neg Hx   . Hypertension Neg Hx   . Kidney disease Neg Hx   . Learning disabilities Neg Hx   . Mental illness Neg Hx   . Mental retardation Neg Hx   . Miscarriages / Stillbirths Neg Hx   . Stroke Neg Hx   . Vision loss Neg Hx   . Varicose Veins Neg Hx     Social History:  Social History   Socioeconomic History  . Marital status: Single    Spouse name: Not on file  . Number of children: 0  . Years of education: college-1  . Highest education level: Not on file  Occupational History  . Occupation: unemployed    Comment: Previously worked [employer], Programmer, applications.  Tobacco Use  . Smoking status: Some Days    Packs/day: 0.50    Types: Cigarettes    Start date: 05/15/1999  . Smokeless tobacco: Never  . Tobacco comments:    0.5 PPD, has now started vaping instead (05/05/22)  Vaping Use  . Vaping Use: Never used  Substance and Sexual Activity  . Alcohol use: Yes    Comment: occasionally  . Drug use: Not Currently    Comment: Last used June 2019. Marijuana: On Saturday  . Sexual activity: Yes    Partners: Male    Birth control/protection: Condom    Comment: 1 partner  Other Topics Concern  . Not on file  Social History Narrative   Born in [place], [place], in 1998 when her mother got a job in one of the mills here.   Now living with her father.  Sometimes with her sister.   Parents separated.   Social Determinants of Health   Financial Resource Strain: High Risk (07/20/2021)   Overall Financial Resource Strain (CARDIA)   . Difficulty of Paying Living Expenses: Very hard  Food Insecurity: Food Insecurity Present (07/20/2021)   Hunger Vital Sign   . Worried About Charity fundraiser in the Last Year: Often true   . Ran Out of Food in the Last Year: Often true  Transportation Needs: Unmet Transportation Needs (07/20/2021)   PRAPARE - Transportation   . Lack of Transportation (Medical): Yes   . Lack of Transportation (Non-Medical): Yes  Physical Activity: Inactive (07/20/2021)   Exercise Vital Sign   . Days of Exercise per Week: 0 days   . Minutes of Exercise per Session: 0 min  Stress: Stress Concern Present (07/20/2021)   [place]   . Feeling of Stress : Very much  Social Connections: Socially Isolated (07/20/2021)   Social Connection and Isolation Panel [NHANES]   . Frequency of Communication with Friends and Family: Once a week   . Frequency of Social Gatherings with Friends and Family: Never   . Attends Religious Services: 1  to 4 times per year   . Active Member of Clubs or Organizations: No   . Attends Archivist Meetings: Never   . Marital Status: Never married    Allergies:  Allergies  Allergen Reactions  . Depakene [Divalproex Sodium] Other (See Comments)    "made me feel like I was going to pass out"    Metabolic Disorder Labs: Lab Results  Component Value Date   HGBA1C 6.0 (H) 02/21/2015   MPG 126 02/21/2015  No results found for: "PROLACTIN" Lab Results  Component Value Date   CHOL 138 02/21/2015   TRIG 60 02/21/2015   HDL 51 02/21/2015   CHOLHDL 2.7 02/21/2015   VLDL 12 02/21/2015   LDLCALC 75 02/21/2015   LDLCALC 86 04/03/2009   Lab Results  Component Value Date   TSH 0.299 (L) 02/20/2015   TSH 0.519 05/10/2010    Therapeutic Level Labs: No results found for: "LITHIUM" No results found for: "VALPROATE" Lab Results  Component Value Date   CBMZ 7.1 02/21/2015    Current Medications: Current Outpatient Medications  Medication Sig Dispense Refill  . ARIPiprazole (ABILIFY) 2 MG tablet Take 1 tablet (2 mg total) by mouth daily. 30 tablet 2  . buprenorphine-naloxone (SUBOXONE) 8-2 mg SUBL SL tablet Place 1 tablet under the tongue 2 (two) times daily. 28 tablet 1  . gabapentin (NEURONTIN) 300 MG capsule Take 1 capsule (300 mg total) by mouth at bedtime. 14 capsule 0  . hydrOXYzine (ATARAX/VISTARIL) 25 MG tablet Take 1 tablet (25 mg total) by mouth every 6 (six) hours as needed for anxiety or itching. 20 tablet 0  . ibuprofen (ADVIL,MOTRIN) 200 MG tablet Take 800 mg by mouth every 6 (six) hours as needed for headache or moderate pain.    Marland Kitchen OXcarbazepine (TRILEPTAL) 150 MG tablet Take 1 tablet (150 mg total) by mouth 2 (two) times daily. 60 tablet 2  . senna (SENOKOT) 8.6 MG TABS tablet Take 1 tablet (8.6 mg total) by mouth daily. 30 tablet 1   No current facility-administered medications for this visit.     Musculoskeletal: Strength & Muscle Tone: within normal  limits Gait & Station: normal Patient leans: N/A  Psychiatric Specialty Exam: Review of Systems  Blood pressure 111/77, pulse 87, weight 130 lb 9.6 oz (59.2 kg), SpO2 100 %.Body mass index is 18.22 kg/m.  General Appearance: Casual  Eye Contact:  Good  Speech:  Clear and Coherent  Volume:  Normal  Mood:  Euthymic  Affect:  Appropriate  Thought Process:  Coherent  Orientation:  Full (Time, Place, and Person)  Thought Content: Logical   Suicidal Thoughts:  No  Homicidal Thoughts:  No  Memory:  Immediate;   Good Recent;   Good  Judgement:  Fair  Insight:  Fair  Psychomotor Activity:  Normal  Concentration:  Concentration: Good  Recall:  Fair  Fund of Knowledge: Good  Language: Good  Akathisia:  No    AIMS (if indicated): not done  Assets: Resilience  ADL's:  Intact  Cognition: WNL  Sleep:  Good   Screenings: AIMS    Flowsheet Row [facility] Ad[facility] (Discharged) [facility] Ad[facility] (Discharged) [facility]  AIMS Total Score 0 0 0      AUDIT    Flowsheet Row Counselor from 07/20/2021 in [facility] Admission (Discharged) [facility] Admission (Discharged) from [facility] Ad[facility] (Discharged) [facility]  Alcohol Use Disorder Identification Test Final Score (AUDIT) 7 3 18 1       GAD-7    Flowsheet Row Office Visit from 11/09/2021 in [facility] Counselor from 08/09/2021 in [facility] Counselor from 07/20/2021 in [facility]  Total GAD-7 Score 7 13  [place] Visit from 05/05/2022 in [facility] Office Visit from 11/09/2021 in [place] Office Visit from 10/05/2021 in [place] Office Visit from 09/24/2021 in [place] from 08/09/2021 in [facility]  PHQ-2 Total Score 0 2 3 6 4   PHQ-9 Total Score -- 9 11 27 21       [place] Office Visit from 05/05/2022 in [facility] ED from 09/23/2021 in [place] DEPT ED from 09/08/2021 in [place] DEPT  C-SSRS RISK CATEGORY No Risk No Risk No Risk        Assessment and Plan: Patient appears to be stable on her current regimen.  Per [facility] [facility] patient meets criteria to receive the new Abilify Asimtufii 2 months LAI.  Patient endorses interest in this and will receive at her next shot appointment.   Bipolar disorder type I, current episode mixed - Continue Abilify Maintena 400 mg today, next appointment in August start patient on Abilify Asimtufii 960 mg -Continue Abilify p.o. 2 mg daily - Continue trileptal 150mg  BID - Follow-up in approximately 8 weeks   Polysubstance use disorder - Currently in [place] clinic where she see's Linna Hoff, NP ( 8-2mg  BID), no longer with IMTS -PDMP appears current   Collaboration of Care: Collaboration of Care:  Patient/Guardian was advised Release of Information must be obtained prior to any record release in order to collaborate their care with an outside provider. Patient/Guardian was advised if they have not already done so to contact the registration department to sign all necessary forms in order for [place] to release information regarding their care.   Consent: Patient/Guardian gives verbal consent for treatment and assignment of benefits for services provided during this visit. Patient/Guardian expressed understanding and agreed to proceed.    Freida Busman, MD 05/05/2022, 10:44 AM

## 2022-05-06 ENCOUNTER — Other Ambulatory Visit: Payer: Self-pay

## 2022-05-25 ENCOUNTER — Telehealth (HOSPITAL_COMMUNITY): Payer: Self-pay | Admitting: *Deleted

## 2022-05-25 NOTE — Telephone Encounter (Signed)
In for her monthly injection which we have scheduled for tomorrow. Checking the chart we have scheduled her too early, she was last given an injection on 05/05/22, rescheduled her for 06/02/22. Also completed the abilify asimtufii paperwork to submit thru patient assistance.

## 2022-05-26 ENCOUNTER — Ambulatory Visit (HOSPITAL_COMMUNITY): Payer: Self-pay

## 2022-06-02 ENCOUNTER — Encounter (HOSPITAL_COMMUNITY): Payer: Self-pay

## 2022-06-02 ENCOUNTER — Ambulatory Visit (INDEPENDENT_AMBULATORY_CARE_PROVIDER_SITE_OTHER): Payer: No Payment, Other | Admitting: *Deleted

## 2022-06-02 VITALS — BP 114/75 | HR 82 | Ht 71.0 in | Wt 138.0 lb

## 2022-06-02 DIAGNOSIS — F3132 Bipolar disorder, current episode depressed, moderate: Secondary | ICD-10-CM | POA: Diagnosis not present

## 2022-06-02 MED ORDER — ARIPIPRAZOLE ER 400 MG IM PRSY
400.0000 mg | PREFILLED_SYRINGE | Freq: Once | INTRAMUSCULAR | Status: AC
  Administered 2022-06-02: 400 mg via INTRAMUSCULAR

## 2022-06-02 NOTE — Progress Notes (Signed)
Patient arrived for her monthly  ARIPiprazole ER (ABILIFY MAINTENA) injection 400 mg    Pleasant as always & Stated that she's  still interested in transitioning to the 2 month Abilify injection 

## 2022-06-28 ENCOUNTER — Other Ambulatory Visit (HOSPITAL_COMMUNITY): Payer: Self-pay

## 2022-06-29 ENCOUNTER — Other Ambulatory Visit (HOSPITAL_COMMUNITY): Payer: Self-pay

## 2022-06-29 ENCOUNTER — Other Ambulatory Visit: Payer: Self-pay

## 2022-06-29 ENCOUNTER — Encounter (HOSPITAL_COMMUNITY): Payer: Self-pay

## 2022-06-29 ENCOUNTER — Ambulatory Visit (HOSPITAL_COMMUNITY): Payer: No Payment, Other

## 2022-06-29 VITALS — BP 133/89 | HR 78 | Ht 71.0 in | Wt 142.0 lb

## 2022-06-29 DIAGNOSIS — F3132 Bipolar disorder, current episode depressed, moderate: Secondary | ICD-10-CM

## 2022-06-29 MED ORDER — ARIPIPRAZOLE ER 400 MG IM PRSY
400.0000 mg | PREFILLED_SYRINGE | Freq: Once | INTRAMUSCULAR | Status: AC
  Administered 2022-06-29: 400 mg via INTRAMUSCULAR

## 2022-06-29 NOTE — Progress Notes (Signed)
Patient arrived for her monthly  ARIPiprazole ER (ABILIFY MAINTENA) injection 400 mg    Pleasant as always & Stated that she's  still interested in transitioning to the 2 month Abilify injection

## 2022-06-30 ENCOUNTER — Other Ambulatory Visit: Payer: Self-pay

## 2022-07-01 ENCOUNTER — Other Ambulatory Visit: Payer: Self-pay

## 2022-07-07 ENCOUNTER — Other Ambulatory Visit: Payer: Self-pay

## 2022-07-11 ENCOUNTER — Encounter (HOSPITAL_COMMUNITY): Payer: No Payment, Other | Admitting: Student in an Organized Health Care Education/Training Program

## 2022-07-13 ENCOUNTER — Encounter: Payer: Self-pay | Admitting: *Deleted

## 2022-07-13 NOTE — Congregational Nurse Program (Signed)
  Dept: [phone]   Congregational Nurse Program Note  Date of Encounter: 07/13/2022  Past Medical History: Past Medical History:  Diagnosis Date   Anxiety    Bipolar 1 disorder ([place])    Carpal tunnel syndrome of right wrist 2016   Chlamydia    Constipation    Ectopic pregnancy 2008 and 2014   2008 required surgery, 2nd Misoprostol   GERD (gastroesophageal reflux disease)    Insomnia    Trichomonas vaginitis    UTI (lower urinary tract infection)     Encounter Details:  CNP Questionnaire - 07/13/22 1325       Questionnaire   Do you give verbal consent to treat you today? Yes    Location Patient Natalie Fischer or Organization    Patient Status Homeless    Insurance Uninsured (Orange Card/Care Connects/Self-Pay)    Insurance Referral N/A    Medication N/A    Medical Provider Yes    Screening Referrals N/A    Medical Referral N/A    Medical Appointment Made N/A   Called [facility], ok to send client for assessment   Food N/A    Transportation N/A   Provided with [facility] CM   Housing/Utilities N/A    Interpersonal Safety N/A    Intervention Support    ED Visit Averted N/A    Life-Saving Intervention Made N/A            Client came to nurse's office asking for help with medicaid. Client reports she needs dental services and has an orange card that pays little for dental. She has worked with [facility] in past. Checked with [facility] and client is not longer eligible for her services. Client is coming back to [facility] on Tuesday. Referred client to [facility] as Probation officer will not be available on Tuesday.  Lynnet Hefley W RN CN

## 2022-07-18 ENCOUNTER — Other Ambulatory Visit (HOSPITAL_COMMUNITY): Payer: Self-pay

## 2022-07-27 ENCOUNTER — Other Ambulatory Visit: Payer: Self-pay

## 2022-07-27 MED ORDER — CIPROFLOXACIN HCL 250 MG PO TABS
250.0000 mg | ORAL_TABLET | Freq: Two times a day (BID) | ORAL | 0 refills | Status: DC
  Filled 2022-07-27 – 2022-11-01 (×2): qty 6, 3d supply, fill #0

## 2022-07-27 MED ORDER — CIPROFLOXACIN HCL 250 MG PO TABS
250.0000 mg | ORAL_TABLET | Freq: Two times a day (BID) | ORAL | 0 refills | Status: DC
  Filled 2022-07-27: qty 3, 1d supply, fill #0

## 2022-07-28 ENCOUNTER — Ambulatory Visit (HOSPITAL_COMMUNITY): Payer: Self-pay

## 2022-08-03 ENCOUNTER — Ambulatory Visit (HOSPITAL_COMMUNITY): Payer: Self-pay

## 2022-08-03 ENCOUNTER — Ambulatory Visit (INDEPENDENT_AMBULATORY_CARE_PROVIDER_SITE_OTHER): Payer: No Payment, Other | Admitting: Student in an Organized Health Care Education/Training Program

## 2022-08-03 ENCOUNTER — Other Ambulatory Visit: Payer: Self-pay

## 2022-08-03 ENCOUNTER — Encounter (HOSPITAL_COMMUNITY): Payer: Self-pay | Admitting: Student in an Organized Health Care Education/Training Program

## 2022-08-03 ENCOUNTER — Encounter (HOSPITAL_COMMUNITY): Payer: Self-pay

## 2022-08-03 VITALS — BP 119/89 | HR 115 | Ht 71.0 in | Wt 137.0 lb

## 2022-08-03 DIAGNOSIS — F3162 Bipolar disorder, current episode mixed, moderate: Secondary | ICD-10-CM

## 2022-08-03 DIAGNOSIS — F3132 Bipolar disorder, current episode depressed, moderate: Secondary | ICD-10-CM

## 2022-08-03 DIAGNOSIS — F1920 Other psychoactive substance dependence, uncomplicated: Secondary | ICD-10-CM

## 2022-08-03 MED ORDER — ABILIFY ASIMTUFII 960 MG/3.2ML IM PRSY
960.0000 mg | PREFILLED_SYRINGE | INTRAMUSCULAR | 6 refills | Status: DC
  Filled 2022-08-03: qty 3.2, fill #0

## 2022-08-03 MED ORDER — OXCARBAZEPINE 150 MG PO TABS
150.0000 mg | ORAL_TABLET | Freq: Two times a day (BID) | ORAL | 2 refills | Status: DC
  Filled 2022-08-03 – 2022-11-01 (×2): qty 60, 30d supply, fill #0

## 2022-08-03 MED ORDER — ARIPIPRAZOLE 2 MG PO TABS
2.0000 mg | ORAL_TABLET | Freq: Every day | ORAL | 4 refills | Status: DC
  Filled 2022-08-03 – 2022-11-01 (×2): qty 30, 30d supply, fill #0

## 2022-08-03 NOTE — Progress Notes (Signed)
BH MD/PA/NP OP Progress Note  08/03/2022 4:12 PM Natalie Fischer  MRN:  [id number]  Chief Complaint:  Chief Complaint  Patient presents with   Follow-up   HPI: Natalie Fischer is a 41 year old female patient with a past psychiatric history of bipolar 1 disorder, polysubstance abuse, anxiety.  Patient is not wearing her normal bright colors.  Patient reports she lost her job after missing a day and was late the next shift. Patient reports that she already has interviews lined up but remains stressed. Patient reports that the reason that she missed work because she was "depressed." Patient reports that she was upset because she had just found out that she and her sister were going to have to find a new place to stay by Nov 30th.  Patient reports that she and her sister are both financially stressed, and this is compounded the stressors.  Patient reports she is not able to keep a job due to suddenly getting depressed and then not showing up.  Patient endorses that she is recognizing a pattern in her behavior and is upset with herself for continuing this pattern.  Patient reports that she continues to feel as though she wants to cry, but has been restrain herself from doing so because she is not sure will help.  Patient reports that since losing her job and finding out about her acute housing problems, she has begun using cocaine again at least every other day.  Patient endorses that she is realize that this may be contributing to her dysphoric mood.  Patient reports that she has not been using her previous healthy coping skills of listening to gospel music or reading to help with her anxiety.  Patient reports she will have to restart doing this as it helped her remain sober in the past.  Patient reports she is confident that she can stop using cocaine on her own.  Patient endorses passive SI but denies active SI. Patient denies HI. Patient reports that her sleep has been ok, but she still has decreased  need for sleep closer to the end of her shots. Patient denies AH but she has been having VH and she is not sure if this is because of her substance use.  Patient reports that she received a letter in the mail notifying her that she was excepted for Abilify Asimtufii.  Visit Diagnosis:    ICD-10-CM   1. Bipolar 1 disorder, depressed, moderate (HCC)  F31.32 ARIPiprazole (ABILIFY) 2 MG tablet    2. Bipolar 1 disorder, mixed, moderate (HCC)  F31.62 ARIPiprazole ER (ABILIFY ASIMTUFII) 960 MG/3.2ML PRSY      Past Psychiatric History: Patient was a patient at [facility] in the past.  Per EMR patient has had trials with Celexa, Latuda, Depakote.  Per patient she recalls Depakote feeling as if it was over sedating.  Patient reports that she has done best on Abilify Maintena, Remeron 7.5 nightly and hydroxyzine 25 mg 3 times daily.  Patient endorses that she has been hospitalized at [facility] at least 2 times with the last time being in 2017.  Patient reports that she attempted suicide as a teenager and her mother was aware after the attempt.   08/2021: On Abilify Maintena 400mg  and Remeron 7.5mg  QHS   12/2020: Patient continue to endorse mood destabilization and appears to have decompensation 2 weeks from her LAI. Patient started on Trileptal  150mg  BID.  02/2022: Patient appears to be stable on Trileptal 150 mg twice daily, Abilify 2 mg daily,  Abilify Maintena 400 mg LAI  04/2022, 05/2022, 06/2022: Patient continued on Abilify Maintena 400 mg and Abilify 2 mg with Trileptal 150 mg twice daily.  Past Medical History:  Past Medical History:  Diagnosis Date   Anxiety    Bipolar 1 disorder ([place])    Carpal tunnel syndrome of right wrist 2016   Chlamydia    Constipation    Ectopic pregnancy 2008 and 2014   2008 required surgery, 2nd Misoprostol   GERD (gastroesophageal reflux disease)    Insomnia    Trichomonas vaginitis    UTI (lower urinary tract infection)     Past Surgical History:  Procedure  Laterality Date   ECTOPIC PREGNANCY SURGERY Right 2008    Family Psychiatric History: Sister: Depression/attempted suicide in past, Paternal aunt: Bipolar-attempted suicide in past  Family History:  Family History  Problem Relation Age of Onset   Cancer Sister 46   Healthy Mother    Healthy Father    COPD Sister    Alcohol abuse Neg Hx    Arthritis Neg Hx    Asthma Neg Hx    Birth defects Neg Hx    Depression Neg Hx    Diabetes Neg Hx    Drug abuse Neg Hx    Early death Neg Hx    Hearing loss Neg Hx    Heart disease Neg Hx    Hyperlipidemia Neg Hx    Hypertension Neg Hx    Kidney disease Neg Hx    Learning disabilities Neg Hx    Mental illness Neg Hx    Mental retardation Neg Hx    Miscarriages / Stillbirths Neg Hx    Stroke Neg Hx    Vision loss Neg Hx    Varicose Veins Neg Hx     Social History:  Social History   Socioeconomic History   Marital status: Single    Spouse name: Not on file   Number of children: 0   Years of education: college-1   Highest education level: Not on file  Occupational History   Occupation: unemployed    Comment: Previously worked [employer], Therapist, art.  Tobacco Use   Smoking status: Some Days    Packs/day: 0.50    Types: Cigarettes    Start date: 05/15/1999   Smokeless tobacco: Never   Tobacco comments:    0.5 PPD, has now started vaping instead (05/05/22)  Vaping Use   Vaping Use: Never used  Substance and Sexual Activity   Alcohol use: Yes    Comment: occasionally   Drug use: Not Currently    Comment: Last used June 2019. Marijuana: On Saturday   Sexual activity: Yes    Partners: Male    Birth control/protection: Condom    Comment: 1 partner  Other Topics Concern   Not on file  Social History Narrative   Born in [place], [place] to [place], in 1998 when her mother got a job in one of the mills here.   Now living with her father.  Sometimes with her sister.   Parents separated.   Social  Determinants of Health   Financial Resource Strain: High Risk (07/20/2021)   Overall Financial Resource Strain (CARDIA)    Difficulty of Paying Living Expenses: Very hard  Food Insecurity: Food Insecurity Present (07/20/2021)   Hunger Vital Sign    Worried About Running Out of Food in the Last Year: Often true    Ran Out of Food in the  Last Year: Often true  Transportation Needs: Unmet Transportation Needs (07/20/2021)   PRAPARE - Transportation    Lack of Transportation (Medical): Yes    Lack of Transportation (Non-Medical): Yes  Physical Activity: Inactive (07/20/2021)   Exercise Vital Sign    Days of Exercise per Week: 0 days    Minutes of Exercise per Session: 0 min  Stress: Stress Concern Present (07/20/2021)   [employer] - Occupational Stress Questionnaire    Feeling of Stress : Very much  Social Connections: Socially Isolated (07/20/2021)   Social Connection and Isolation Panel [NHANES]    Frequency of Communication with Friends and Family: Once a week    Frequency of Social Gatherings with Friends and Family: Never    Attends Religious Services: 1 to 4 times per year    Active Member of [employer] or Organizations: No    Attends BankerClub or Organization Meetings: Never    Marital Status: Never married    Allergies:  Allergies  Allergen Reactions   Depakene [Divalproex Sodium] Other (See Comments)    "made me feel like I was going to pass out"    Metabolic Disorder Labs: Lab Results  Component Value Date   HGBA1C 6.0 (H) 02/21/2015   MPG 126 02/21/2015   No results found for: "PROLACTIN" Lab Results  Component Value Date   CHOL 138 02/21/2015   TRIG 60 02/21/2015   HDL 51 02/21/2015   CHOLHDL 2.7 02/21/2015   VLDL 12 02/21/2015   LDLCALC 75 02/21/2015   LDLCALC 86 04/03/2009   Lab Results  Component Value Date   TSH 0.299 (L) 02/20/2015   TSH 0.519 05/10/2010    Therapeutic Level Labs: No results found for: "LITHIUM" No results found  for: "VALPROATE" Lab Results  Component Value Date   CBMZ 7.1 02/21/2015    Current Medications: Current Outpatient Medications  Medication Sig Dispense Refill   ARIPiprazole (ABILIFY) 2 MG tablet Take 1 tablet (2 mg total) by mouth daily. 30 tablet 4   ARIPiprazole ER (ABILIFY ASIMTUFII) 960 MG/3.2ML PRSY Inject 960 mg into the muscle every 2 months. 3.2 mL 6   buprenorphine-naloxone (SUBOXONE) 8-2 mg SUBL SL tablet Place 1 tablet under the tongue 2 (two) times daily. 28 tablet 1   ciprofloxacin (CIPRO) 250 MG tablet Take 1 tablet (250 mg total) by mouth 2 (two) times daily. 6 tablet 0   ciprofloxacin (CIPRO) 250 MG tablet Take 1 tablet by mouth twice a day 3 tablet 0   gabapentin (NEURONTIN) 300 MG capsule Take 1 capsule (300 mg total) by mouth at bedtime. 14 capsule 0   hydrOXYzine (ATARAX/VISTARIL) 25 MG tablet Take 1 tablet (25 mg total) by mouth every 6 (six) hours as needed for anxiety or itching. 20 tablet 0   ibuprofen (ADVIL,MOTRIN) 200 MG tablet Take 800 mg by mouth every 6 (six) hours as needed for headache or moderate pain.     OXcarbazepine (TRILEPTAL) 150 MG tablet Take 1 tablet (150 mg total) by mouth 2 (two) times daily. 60 tablet 2   senna (SENOKOT) 8.6 MG TABS tablet Take 1 tablet by mouth daily. 30 tablet 1   No current facility-administered medications for this visit.     Musculoskeletal: Strength & Muscle Tone: within normal limits Gait & Station: normal Patient leans: N/A  Psychiatric Specialty Exam: Review of Systems  Psychiatric/Behavioral:  Positive for dysphoric mood.     There were no vitals taken for this visit.There is no height or weight on  file to calculate BMI.  General Appearance: Casual  Eye Contact:  Good  Speech:  Clear and Coherent  Volume:  Normal  Mood:  Dysphoric  Affect:  Appropriate  Thought Process:  Coherent  Orientation:  Full (Time, Place, and Person)  Thought Content: Logical   Suicidal Thoughts:  No  Homicidal Thoughts:  No   Memory:  Immediate;   Good Recent;   Good Remote;   Good  Judgement:  Fair  Insight:  Good  Psychomotor Activity:  Normal  Concentration:  Concentration: Fair  Recall:  Good  Fund of Knowledge: Fair  Language: Good  Akathisia:  No  Handed:    AIMS (if indicated): not done  Assets:  Communication Skills Desire for Improvement Resilience Social Support  ADL's:  Intact  Cognition: WNL  Sleep:  Good   Screenings: AIMS    Flowsheet Row Admission (Discharged) from 08/07/2017 in [facility] Admission (Discharged) from 01/22/2016 in [facility] [place] [facility] Admission (Discharged) from 02/20/2015 in [facility]  AIMS Total Score 0 0 0      AUDIT    Flowsheet Row Counselor from 07/20/2021 in [facility] Admission (Discharged) from 08/07/2017 in [facility] Admission (Discharged) from 01/22/2016 in [facility] Admission (Discharged) from 02/20/2015 in [facility]  Alcohol Use Disorder Identification Test Final Score (AUDIT) 7 3 18 1       GAD-7    Flowsheet Row Office Visit from 08/03/2022 in [facility] Office Visit from 11/09/2021 in [facility] from 08/09/2021 in [facility] Counselor from 07/20/2021 in [facility]  Total GAD-7 Score 20 7 13 18       PHQ2-9    [place] Office Visit from 08/03/2022 in [facility] Office Visit from 05/05/2022 in [facility] Office Visit from 11/09/2021 in [facility] Office Visit from 10/05/2021 in [facility] Office Visit from 09/24/2021 in [place]  PHQ-2 Total Score 4 0 2 3 6   PHQ-9  Total Score 21 -- 9 11 27       [place] Office Visit from 05/05/2022 in [facility] ED from 09/23/2021 in [place] DEPT ED from 09/08/2021 in [place] DEPT  C-SSRS RISK CATEGORY No Risk No Risk No Risk        Assessment and Plan: Patient appears to be having dysphoric symptoms as well as scored high on her GAD-7 however, patient endorses acute life stressors as well as concurrent stimulant use.  Patient has a history of decompensation in the setting.  We will start patient on Abilify Asimtufii as previously planned we will also have patient visit DSS in house specialist to assist with aid.  Patient has positive outlook with good insight recognizing that her substance use is contributing to dysphoric mood.  Patient also has a history of endorsing more hypomanic symptoms towards the end of her shot, thus this provider is not extremely concerned about patient's decreased need for sleep in the last few days she has also been using a stimulant.  Patient also endorses good judgment by recognizing that she sees benefit in her medication and despite feeling as though she is in a cycle, she is continue to get  her medication on time the last 3 months.  Bipolar disorder type I, current episode mixed - Continue Abilify Asimtufii 960 mg, will reassess at next visit - Continue Abilify p.o. 2 mg daily - Continue Trileptal 150 mg twice daily - Follow-up in approximately 8 weeks  Collaboration of Care: Collaboration of Care:   Patient/Guardian was advised Release of Information must be obtained prior to any record release in order to collaborate their care with an outside provider. Patient/Guardian was advised if they have not already done so to contact the registration department to sign all necessary forms in order for [place] to release information regarding their care.   Consent: Patient/Guardian gives verbal consent  for treatment and assignment of benefits for services provided during this visit. Patient/Guardian expressed understanding and agreed to proceed.   PGY-3 Freida Busman, MD 08/03/2022, 4:12 PM

## 2022-08-03 NOTE — Progress Notes (Signed)
PATIENT PRESENTS TO THE OFFICE FOR ABILIFY ASIMTUFII 720 INJECTION GIVEN IN THE RIGHT UPPER GLUT PATIENT DID WELL

## 2022-08-08 ENCOUNTER — Encounter (HOSPITAL_COMMUNITY): Payer: Self-pay

## 2022-08-08 ENCOUNTER — Encounter (HOSPITAL_COMMUNITY): Payer: Self-pay | Admitting: Student in an Organized Health Care Education/Training Program

## 2022-08-10 ENCOUNTER — Other Ambulatory Visit: Payer: Self-pay

## 2022-09-12 ENCOUNTER — Other Ambulatory Visit (HOSPITAL_COMMUNITY): Payer: Self-pay

## 2022-09-28 ENCOUNTER — Ambulatory Visit (HOSPITAL_COMMUNITY): Payer: No Payment, Other

## 2022-10-05 ENCOUNTER — Ambulatory Visit (HOSPITAL_COMMUNITY): Payer: No Payment, Other

## 2022-10-05 ENCOUNTER — Encounter (HOSPITAL_COMMUNITY): Payer: Self-pay

## 2022-10-05 VITALS — BP 118/80 | HR 90 | Ht 71.0 in | Wt 132.4 lb

## 2022-10-05 DIAGNOSIS — F3132 Bipolar disorder, current episode depressed, moderate: Secondary | ICD-10-CM

## 2022-10-05 DIAGNOSIS — F3162 Bipolar disorder, current episode mixed, moderate: Secondary | ICD-10-CM

## 2022-10-05 DIAGNOSIS — F1920 Other psychoactive substance dependence, uncomplicated: Secondary | ICD-10-CM

## 2022-10-05 MED ORDER — ARIPIPRAZOLE ER 720 MG/2.4ML IM PRSY
720.0000 mg | PREFILLED_SYRINGE | Freq: Once | INTRAMUSCULAR | Status: AC
  Administered 2022-10-05: 720 mg via INTRAMUSCULAR

## 2022-10-05 NOTE — Progress Notes (Signed)
  PATIENT PRESENTS TO THE OFFICE FOR ABILIFY ASIMTUFII 720 INJECTION GIVEN IN THE LEFT UPPER GLUT PATIENT DID WELL

## 2022-11-01 ENCOUNTER — Other Ambulatory Visit: Payer: Self-pay

## 2022-11-02 ENCOUNTER — Other Ambulatory Visit: Payer: Self-pay

## 2022-12-06 ENCOUNTER — Ambulatory Visit (HOSPITAL_COMMUNITY): Payer: Self-pay

## 2022-12-06 ENCOUNTER — Other Ambulatory Visit: Payer: Self-pay

## 2022-12-06 ENCOUNTER — Other Ambulatory Visit (HOSPITAL_COMMUNITY): Payer: Self-pay

## 2022-12-06 ENCOUNTER — Other Ambulatory Visit (HOSPITAL_COMMUNITY): Payer: Self-pay | Admitting: Psychiatry

## 2022-12-06 ENCOUNTER — Encounter (HOSPITAL_COMMUNITY): Payer: Self-pay

## 2022-12-06 VITALS — BP 114/77 | HR 93 | Temp 97.7°F | Resp 18 | Ht 71.0 in | Wt 128.6 lb

## 2022-12-06 DIAGNOSIS — F3132 Bipolar disorder, current episode depressed, moderate: Secondary | ICD-10-CM

## 2022-12-06 MED ORDER — ARIPIPRAZOLE ER 720 MG/2.4ML IM PRSY
720.0000 mg | PREFILLED_SYRINGE | Freq: Once | INTRAMUSCULAR | Status: AC
  Administered 2022-12-06: 720 mg via INTRAMUSCULAR

## 2022-12-06 MED ORDER — ABILIFY ASIMTUFII 960 MG/3.2ML IM PRSY
960.0000 mg | PREFILLED_SYRINGE | INTRAMUSCULAR | 11 refills | Status: DC
  Filled 2022-12-06: qty 3.2, 60d supply, fill #0

## 2022-12-06 MED ORDER — ARIPIPRAZOLE ER 960 MG/3.2ML IM PRSY
960.0000 mg | PREFILLED_SYRINGE | Freq: Once | INTRAMUSCULAR | Status: AC
  Administered 2023-02-07: 960 mg via INTRAMUSCULAR

## 2022-12-06 NOTE — Telephone Encounter (Signed)
Patient has bee seeing Dr. Damita Dunnings regularly. Per Dr. Ellan Lambert note the patient is prescribed Abilify Asimtufi 960 mg. The patient however has been receiving Abilify Asimtufi 720 mg every two months. Patient initially informed staff that she finds her current dose affective. Nursing staff administered then Asimtufi 720 mg. Writer called patient to discuss current treatment. Patient notified writer that she loves Abilify Asimtufi but reports that it wears off prior to her next injection. Provider reccommended increasing doses to 960 mg every two months. She endorsed understanding and agreed. Potential side effects of medication and risks vs benefits of treatment vs non-treatment were explained and discussed. All questions were answered. No other concerns noted at this time.

## 2022-12-06 NOTE — Progress Notes (Signed)
Patient arrived for injection of Abilfy Asimtufii. Patients medication was in her bin at 782m provided by her pharmacy. Name and strength printed on medication sticker on mediation. Given in Right upper outer quadrant without issues or complaints. Patient states that the medication has been a "Chief Strategy Officer for her. She is doing well and has no side effects. Patient left and upon documenting medication it was noted that order was written for Asimtufii 961m Chart reviewed and patient has been receiving [id number] prior visits in October and December. Original order was to start 960m20mter the visit in August. Safety zone portal entered, MD made aware as well as supervisor ShawBeather Arbourtient was called by MD who states the medication has been working well but she would like to discuss increasing her dosage at her next appointment that she scheduled today when she left.

## 2022-12-08 ENCOUNTER — Encounter (HOSPITAL_COMMUNITY): Payer: Medicaid Other | Admitting: Student in an Organized Health Care Education/Training Program

## 2022-12-23 ENCOUNTER — Encounter (HOSPITAL_COMMUNITY): Payer: Medicaid Other | Admitting: Student in an Organized Health Care Education/Training Program

## 2022-12-23 ENCOUNTER — Encounter (HOSPITAL_COMMUNITY): Payer: Self-pay

## 2022-12-23 NOTE — Progress Notes (Deleted)
Patient missed appt, today, but provider called "[facility]" and clarified that patient should be receiving '960mg'$  Abilify Asimtufii dose, pharmacist corrected and should be received in the mail next week.    PGY-3 Damita Dunnings, mD

## 2023-01-11 ENCOUNTER — Encounter (HOSPITAL_COMMUNITY): Payer: Self-pay | Admitting: Student in an Organized Health Care Education/Training Program

## 2023-01-12 ENCOUNTER — Telehealth (HOSPITAL_COMMUNITY): Payer: Self-pay | Admitting: Student in an Organized Health Care Education/Training Program

## 2023-01-12 NOTE — Telephone Encounter (Signed)
Called patient, reiterated what was discussed at last phone call, that her dose has been increased. Patient endorsed she forgot that conversation, but is good with the dose increase.

## 2023-02-07 ENCOUNTER — Ambulatory Visit (INDEPENDENT_AMBULATORY_CARE_PROVIDER_SITE_OTHER): Payer: Medicaid Other | Admitting: Psychiatry

## 2023-02-07 ENCOUNTER — Ambulatory Visit (INDEPENDENT_AMBULATORY_CARE_PROVIDER_SITE_OTHER): Payer: Medicaid Other | Admitting: *Deleted

## 2023-02-07 ENCOUNTER — Encounter (HOSPITAL_COMMUNITY): Payer: Self-pay

## 2023-02-07 VITALS — BP 123/75 | HR 91 | Resp 16 | Ht 71.0 in | Wt 130.2 lb

## 2023-02-07 DIAGNOSIS — F411 Generalized anxiety disorder: Secondary | ICD-10-CM | POA: Diagnosis not present

## 2023-02-07 DIAGNOSIS — F3132 Bipolar disorder, current episode depressed, moderate: Secondary | ICD-10-CM

## 2023-02-07 DIAGNOSIS — F3162 Bipolar disorder, current episode mixed, moderate: Secondary | ICD-10-CM | POA: Diagnosis not present

## 2023-02-07 MED ORDER — ABILIFY ASIMTUFII 960 MG/3.2ML IM PRSY: 960.0000 mg | PREFILLED_SYRINGE | INTRAMUSCULAR | 11 refills | Status: DC

## 2023-02-07 MED ORDER — HYDROXYZINE HCL 25 MG PO TABS: 25.0000 mg | ORAL_TABLET | Freq: Four times a day (QID) | ORAL | 3 refills | Status: DC | PRN

## 2023-02-07 MED ORDER — MIRTAZAPINE 15 MG PO TABS: 15.0000 mg | ORAL_TABLET | Freq: Every day | ORAL | 3 refills | Status: DC

## 2023-02-07 MED ORDER — CARBAMAZEPINE ER 200 MG PO TB12: 200.0000 mg | ORAL_TABLET | Freq: Two times a day (BID) | ORAL | 3 refills | Status: DC

## 2023-02-07 NOTE — Progress Notes (Signed)
BH MD/PA/NP OP Progress Note  02/07/2023 1:49 PM Natalie Fischer  MRN:  [id number]  Chief Complaint: " I have been more anxious and depressed"  HPI: 42 year old female seen today for follow-up psychiatric evaluation.  She has a psychiatric history of anxiety, depression, bipolar disorder, substance use (cocaine, marijuana, opioids, and benzos).  Currently she is managed on Abilify Asimtufii 960 mg every 2 months.  She reports her medications are somewhat effective in managing her psychiatric conditions.  Today she is well-groomed, pleasant, cooperative, and engaged in conversation.  She informed Clinical research associate that she has been more anxious and depressed.  She reports that she is homeless and worries about her mental health.  Patient informed Clinical research associate that she finds support from her boyfriend, her mother, and her sister but notes that her mother and sister are also homeless at this time.  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  Currently she is unemployed.  Today provider conducted GAD-7 and patient scored a 21.  Provider also conducted a PHQ-9 and patient scored a 27.  She endorses decreased appetite and poor sleep.  Today she endorses passive SI but denies wanting to harm herself.  At times patient notes that she sees bugs.  She informed Clinical research associate that this happens approximately twice a week.  She denies auditory hallucinations or paranoia.  Since her last visit she notes that her appetite has been poor and reports that she is lost 3 pounds.  Patient has a history of substance use however reports that she has not engaged in substances for months.  Patient was once prescribed mirtazapine and Tegretol and reports that it was effective in helping manage her mood along with her Abilify Asimtufii.  Today she is agreeable to restarting Tegretol 200 mg twice daily to help manage mood and mirtazapine 15 mg nightly to help manage sleep, anxiety, depression.   She is also agreeable to restarting hydroxyzine 25 mg 3 times daily as needed. Visit Diagnosis:    ICD-10-CM   1. Generalized anxiety disorder  F41.1 mirtazapine (REMERON) 15 MG tablet    hydrOXYzine (ATARAX) 25 MG tablet    2. Bipolar 1 disorder, mixed, moderate  F31.62 carbamazepine (TEGRETOL-XR) 200 MG 12 hr tablet    ARIPiprazole ER (ABILIFY ASIMTUFII) 960 MG/3.2ML PRSY      Past Psychiatric History: anxiety, depression, bipolar disorder, substance use (cocaine, marijuana, opioids, and benzos)  Past Medical History:  Past Medical History:  Diagnosis Date   Anxiety    Bipolar 1 disorder    Carpal tunnel syndrome of right wrist 2016   Chlamydia    Constipation    Ectopic pregnancy 2008 and 2014   2008 required surgery, 2nd Misoprostol   GERD (gastroesophageal reflux disease)    Insomnia    Trichomonas vaginitis    UTI (lower urinary tract infection)     Past Surgical History:  Procedure Laterality Date   ECTOPIC PREGNANCY SURGERY Right 2008    Family Psychiatric History: Sister: Depression/attempted suicide in past, Paternal aunt: Bipolar-attempted suicide in past   Family History:  Family History  Problem Relation Age of Onset   Cancer Sister 58   Healthy Mother    Healthy Father    COPD Sister    Alcohol abuse Neg Hx    Arthritis Neg Hx    Asthma Neg Hx    Birth defects Neg Hx    Depression Neg Hx    Diabetes Neg  Hx    Drug abuse Neg Hx    Early death Neg Hx    Hearing loss Neg Hx    Heart disease Neg Hx    Hyperlipidemia Neg Hx    Hypertension Neg Hx    Kidney disease Neg Hx    Learning disabilities Neg Hx    Mental illness Neg Hx    Mental retardation Neg Hx    Miscarriages / Stillbirths Neg Hx    Stroke Neg Hx    Vision loss Neg Hx    Varicose Veins Neg Hx     Social History:  Social History   Socioeconomic History   Marital status: Single    Spouse name: Not on file   Number of children: 0   Years of education: college-1   Highest  education level: Not on file  Occupational History   Occupation: unemployed    Comment: Previously worked [employer], Clinical biochemist.  Tobacco Use   Smoking status: Some Days    Packs/day: .5    Types: Cigarettes    Start date: 05/15/1999   Smokeless tobacco: Never   Tobacco comments:    0.5 PPD, has now started vaping instead (05/05/22)  Vaping Use   Vaping Use: Never used  Substance and Sexual Activity   Alcohol use: Yes    Comment: occasionally   Drug use: Not Currently    Comment: Last used June 2019. Marijuana: On Saturday   Sexual activity: Yes    Partners: Male    Birth control/protection: Condom    Comment: 1 partner  Other Topics Concern   Not on file  Social History Narrative   Born in [place], [place] to [place], in 1998 when her mother got a job in [place] of the [place].   Now living with her father.  Sometimes with her sister.   Parents separated.   Social Determinants of Health   Financial Resource Strain: High Risk (07/20/2021)   Overall Financial Resource Strain (CARDIA)    Difficulty of Paying Living Expenses: Very hard  Food Insecurity: Food Insecurity Present (07/20/2021)   Hunger Vital Sign    Worried About Running Out of Food in the Last Year: Often true    Ran Out of Food in the Last Year: Often true  Transportation Needs: Unmet Transportation Needs (07/20/2021)   PRAPARE - Administrator, Civil Service (Medical): Yes    Lack of Transportation (Non-Medical): Yes  Physical Activity: Inactive (07/20/2021)   Exercise Vital Sign    Days of Exercise per Week: 0 days    Minutes of Exercise per Session: 0 min  Stress: Stress Concern Present (07/20/2021)   [employer] of Occupational Health - Occupational Stress Questionnaire    Feeling of Stress : Very much  Social Connections: Socially Isolated (07/20/2021)   Social Connection and Isolation Panel [NHANES]    Frequency of Communication with Friends and Family: Once  a week    Frequency of Social Gatherings with Friends and Family: Never    Attends Religious Services: 1 to 4 times per year    Active Member of [employer] or Organizations: No    Attends Banker Meetings: Never    Marital Status: Never married    Allergies:  Allergies  Allergen Reactions   Depakene [Divalproex Sodium] Other (See Comments)    "made me feel like I was going to pass out"    Metabolic Disorder Labs: Lab Results  Component  Value Date   HGBA1C 6.0 (H) 02/21/2015   MPG 126 02/21/2015   No results found for: "PROLACTIN" Lab Results  Component Value Date   CHOL 138 02/21/2015   TRIG 60 02/21/2015   HDL 51 02/21/2015   CHOLHDL 2.7 02/21/2015   VLDL 12 02/21/2015   LDLCALC 75 02/21/2015   LDLCALC 86 04/03/2009   Lab Results  Component Value Date   TSH 0.299 (L) 02/20/2015   TSH 0.519 05/10/2010    Therapeutic Level Labs: No results found for: "LITHIUM" No results found for: "VALPROATE" Lab Results  Component Value Date   CBMZ 7.1 02/21/2015    Current Medications: Current Outpatient Medications  Medication Sig Dispense Refill   carbamazepine (TEGRETOL-XR) 200 MG 12 hr tablet Take 1 tablet (200 mg total) by mouth 2 (two) times daily. 60 tablet 3   mirtazapine (REMERON) 15 MG tablet Take 1 tablet (15 mg total) by mouth at bedtime. 30 tablet 3   ARIPiprazole ER (ABILIFY ASIMTUFII) 960 MG/3.2ML PRSY Inject 960 mg into the muscle every 2 months. 3.2 mL 11   buprenorphine-naloxone (SUBOXONE) 8-2 mg SUBL SL tablet Place 1 tablet under the tongue 2 (two) times daily. 28 tablet 1   ciprofloxacin (CIPRO) 250 MG tablet Take 1 tablet (250 mg total) by mouth 2 (two) times daily. 6 tablet 0   gabapentin (NEURONTIN) 300 MG capsule Take 1 capsule (300 mg total) by mouth at bedtime. 14 capsule 0   hydrOXYzine (ATARAX) 25 MG tablet Take 1 tablet (25 mg total) by mouth every 6 (six) hours as needed for anxiety or itching. 90 tablet 3   ibuprofen (ADVIL,MOTRIN) 200  MG tablet Take 800 mg by mouth every 6 (six) hours as needed for headache or moderate pain.     senna (SENOKOT) 8.6 MG TABS tablet Take 1 tablet by mouth daily. 30 tablet 1   No current facility-administered medications for this visit.     Musculoskeletal: Strength & Muscle Tone: within normal limits Gait & Station: normal Patient leans: N/A  Psychiatric Specialty Exam: Review of Systems  There were no vitals taken for this visit.There is no height or weight on file to calculate BMI.  General Appearance: Well Groomed  Eye Contact:  Good  Speech:  Clear and Coherent and Normal Rate  Volume:  Normal  Mood:  Anxious and Depressed  Affect:  Appropriate and Congruent  Thought Process:  Coherent, Goal Directed, and Linear  Orientation:  Full (Time, Place, and Person)  Thought Content: Logical and Hallucinations: Visual   Suicidal Thoughts:  Yes.  without intent/plan  Homicidal Thoughts:  No  Memory:  Immediate;   Good Recent;   Good Remote;   Good  Judgement:  Good  Insight:  Good  Psychomotor Activity:  Normal  Concentration:  Concentration: Good and Attention Span: Good  Recall:  Good  Fund of Knowledge: Good  Language: Good  Akathisia:  No  Handed:  Right  AIMS (if indicated): not done  Assets:  Communication Skills Desire for Improvement Financial Resources/Insurance Intimacy Leisure Time Physical Health Social Support  ADL's:  Intact  Cognition: WNL  Sleep:  Fair   Screenings: AIMS    Flowsheet Row [facility]) [facility]  AIMS Total Score 0 0 0      AUDIT    Advertising copywriter from  07/20/2021 in [facility]) [facility] (Dis[facility] ([facility]) [facility]  Alcohol Use Disorder Identification Test Final Score (AUDIT) 7 3 18 1       GAD-7    Flowsheet Row Office Visit from 02/07/2023 in [facility] Office Visit from 08/03/2022 in [facility] Office Visit from 11/09/2021 in [facility] Counselor from 08/09/2021 in [facility] Counselor from 07/20/2021 in [facility]  Total GAD-7 Score 21 20 7 13 18       PHQ2-9    Flowsheet Row Office Visit from 02/07/2023 in [facility] Office Visit from 08/03/2022 in [facility] Office Visit from 05/05/2022 in [facility] Office Visit from 11/09/2021 in [facility] Office Visit from 10/05/2021 in [facility]  PHQ-2 Total Score 6 4 0 2 3  PHQ-9 Total Score 27 21 -- 9 11      Flowsheet Row Office Visit from 02/07/2023 in [facility] Office Visit from 05/05/2022 in [facility] from 09/23/2021 in [facility]  C-SSRS RISK CATEGORY Error: Q7 should not be populated when Q6 is No No Risk No Risk        Assessment and Plan: Patient endorses symptoms of anxiety, depression, insomnia, and reports that she becomes hypomanic at the end of the month.  She found Tegretol and mirtazapine helpful in the past and is agreeable to restarting them today.  Today Tegretol 200 mg twice daily and mirtazapine 15 mg nightly restarted.  Patient agreeable to restarting hydroxyzine 25 mg 3 times daily as needed.  She will continue all other medications as prescribed.  1. Bipolar 1 disorder,  mixed, moderate  Restart- carbamazepine (TEGRETOL-XR) 200 MG 12 hr tablet; Take 1 tablet (200 mg total) by mouth 2 (two) times daily.  Dispense: 60 tablet; Refill: 3 Continue- ARIPiprazole ER (ABILIFY ASIMTUFII) 960 MG/3.2ML PRSY; Inject 960 mg into the muscle every 2 months.  Dispense: 3.2 mL; Refill: 11  2. Generalized anxiety disorder  Restart- mirtazapine (REMERON) 15 MG tablet; Take 1 tablet (15 mg total) by mouth at bedtime.  Dispense: 30 tablet; Refill: 3 Restart- hydrOXYzine (ATARAX) 25 MG tablet; Take 1 tablet (25 mg total) by mouth every 6 (six) hours as needed for anxiety or itching.  Dispense: 90 tablet; Refill: 3   Collaboration of Care: Collaboration of Care: Other provider involved in patient's care AEB PCP and shot clinic staff  Patient/Guardian was advised Release of Information must be obtained prior to any record release in order to collaborate their care with an outside provider. Patient/Guardian was advised if they have not already done so to contact the registration department to sign all necessary forms in order for [place] to release information regarding their care.   Consent: Patient/Guardian gives verbal consent for treatment and assignment of benefits for services provided during this visit. Patient/Guardian expressed understanding and agreed to proceed.   Follow-up in 2 months Follow-up with shot clinic staff  Shanna Cisco, NP 02/07/2023, 1:49 PM

## 2023-02-07 NOTE — Progress Notes (Signed)
Patient arrived for injection of Abilify Asimtufii  given in Left Upper Outer Quadrant without issue or complaint. Denies SI/HI or AV hallucinations. Will return in 2 months.

## 2023-02-22 ENCOUNTER — Ambulatory Visit (INDEPENDENT_AMBULATORY_CARE_PROVIDER_SITE_OTHER): Payer: Medicaid Other | Admitting: Student in an Organized Health Care Education/Training Program

## 2023-02-22 DIAGNOSIS — F411 Generalized anxiety disorder: Secondary | ICD-10-CM | POA: Diagnosis not present

## 2023-02-22 MED ORDER — HYDROXYZINE HCL 25 MG PO TABS
25.0000 mg | ORAL_TABLET | Freq: Three times a day (TID) | ORAL | 3 refills | Status: DC | PRN
Start: 2023-02-22 — End: 2023-06-13

## 2023-02-22 MED ORDER — MIRTAZAPINE 15 MG PO TABS
15.0000 mg | ORAL_TABLET | Freq: Every day | ORAL | 3 refills | Status: DC
Start: 2023-02-22 — End: 2023-06-13

## 2023-02-22 NOTE — Patient Instructions (Signed)
Medication Changes  Decrease tegretol (Carbamezapine) to 200mg  daily for 3 days then stop.  Start Remeron (Mirtazapine) 15mg  nightly  Continue Abilify Asimtufii every 2months  Restart Vistaril 25mg ,  3x/day as needed for anxiety  Continue Suboxone per [facility] provider recommendations

## 2023-02-22 NOTE — Progress Notes (Addendum)
BH MD/PA/NP OP Progress Note  02/22/2023 6:18 PM DA MICHELLE  MRN:  [id number]  Chief Complaint:  Chief Complaint  Patient presents with   Follow-up   HPI:   Patient reports that she did not get the remeron 15mg  QHS, and is not sure why it was not delivered.   Abilify Asimtufii 950mg  every 2 mon Tegretol 200mg  BID Suboxone 8-2mg  TID, rx [facility]  Does not take Hydroxyzine, but knows she needs it. Patient endorses feeling frequently anxious. She endorse constantly being tense. She also feels like her speech is pressured.   Patient reports that she is "ok." She reports that she feel more forgetful lately and she also feels like her concentration is poor. She also feels like she has a memory retention issue. She also feels like she is having a hard time having linear thoughts. She feels like she is very much distracted by her own thoughts and has poor thought process. She reports that she feels like she has been having more depressive episodes, where she feels hopeless. She does think the tegretol has helped.   Patient reports that she is in a shelter currently, she is also applying for disability. She reports that she was making more mistakes at her last job, due to issues with steps in work and not being able to remember the steps to get things done. Patient reports that she does miss working.   Patient reports that she never gets good rests and feels like she is constantly waking up at night. Patient reports that her appetite is low but this is normal for her. She averages 1 meal/ day but does try to make sure she eats. She also reports that she was constipated until recently, she is now going regularly.   No substance use in 4 months Etoh- 1 cup of wine every once in a while  Patient did just get a housing voucher so she should be moving out of [facility] soon.   Patient denies SI but endorses passive SI with thoughts of not being on [place] at least 2x/ week. Patient is able to endorse her  mom and religion as protective factors. Patient denies active HI but endorses severe anger to the people she believes killed her brother in 11/2019. She denies AH she reports that she still see's bugs and this happens regularly, she reports that is often more when she alone and ruminating. Sometimes this will also have a tactile hallucinations of a bug sensation on her. She reports she has had this since 58 or 42 yo. She reports that she will sometimes ask other people if they see it, if she realizes that it is a VH she will try to distract herself.  She does not think anyone would be trying to play tricks on her.   Visit Diagnosis:    ICD-10-CM   1. Generalized anxiety disorder  F41.1 mirtazapine (REMERON) 15 MG tablet    hydrOXYzine (ATARAX) 25 MG tablet      Past Psychiatric History:   Lamictal- rash on face  Patient was a patient at [facility] in the past.  Per EMR patient has had trials with Celexa, Latuda, Depakote.  Per patient she recalls Depakote feeling as if it was over sedating.  Patient reports that she has done best on Abilify Maintena, Remeron 7.5 nightly and hydroxyzine 25 mg 3 times daily.  Patient endorses that she has been hospitalized at [facility] at least 2 times with the last time being in 2017.  Patient  reports that she attempted suicide as a teenager and her mother was aware after the attempt.   08/2021: On Abilify Maintena 400mg  and Remeron 7.5mg  QHS   12/2020: Patient continue to endorse mood destabilization and appears to have decompensation 2 weeks from her LAI. Patient started on Trileptal  150mg  BID.  02/2022: Patient appears to be stable on Trileptal 150 mg twice daily, Abilify 2 mg daily, Abilify Maintena 400 mg LAI   04/2022, 05/2022, 06/2022: Patient continued on Abilify Maintena 400 mg and Abilify 2 mg with Trileptal 150 mg twice daily.  07/2022-Substance use relapse-cocaine. Patient has positive outlook with good insight recognizing that her substance use is contributing to  dysphoric mood. Patient also has a history of endorsing more hypomanic symptoms towards the end of her shot, thus this provider is not extremely concerned about patient's decreased need for sleep in the last few days she has also been using a stimulant.   Past Medical History:  Past Medical History:  Diagnosis Date   Anxiety    Bipolar 1 disorder (HCC)    Carpal tunnel syndrome of right wrist 2016   Chlamydia    Constipation    Ectopic pregnancy 2008 and 2014   2008 required surgery, 2nd Misoprostol   GERD (gastroesophageal reflux disease)    Insomnia    Trichomonas vaginitis    UTI (lower urinary tract infection)     Past Surgical History:  Procedure Laterality Date   ECTOPIC PREGNANCY SURGERY Right 2008    Family Psychiatric History: Sister: Depression/attempted suicide in past, Paternal aunt: Bipolar-attempted suicide in past   Family History:  Family History  Problem Relation Age of Onset   Cancer Sister 68   Healthy Mother    Healthy Father    COPD Sister    Alcohol abuse Neg Hx    Arthritis Neg Hx    Asthma Neg Hx    Birth defects Neg Hx    Depression Neg Hx    Diabetes Neg Hx    Drug abuse Neg Hx    Early death Neg Hx    Hearing loss Neg Hx    Heart disease Neg Hx    Hyperlipidemia Neg Hx    Hypertension Neg Hx    Kidney disease Neg Hx    Learning disabilities Neg Hx    Mental illness Neg Hx    Mental retardation Neg Hx    Miscarriages / Stillbirths Neg Hx    Stroke Neg Hx    Vision loss Neg Hx    Varicose Veins Neg Hx     Social History:  Social History   Socioeconomic History   Marital status: Single    Spouse name: Not on file   Number of children: 0   Years of education: college-1   Highest education level: Not on file  Occupational History   Occupation: unemployed    Comment: Previously worked [employer], Clinical biochemist.  Tobacco Use   Smoking status: Some Days    Packs/day: .5    Types: Cigarettes    Start date: 05/15/1999    Smokeless tobacco: Never   Tobacco comments:    0.5 PPD, has now started vaping instead (05/05/22)  Vaping Use   Vaping Use: Never used  Substance and Sexual Activity   Alcohol use: Yes    Comment: occasionally   Drug use: Not Currently    Comment: Last used June 2019. Marijuana: On Saturday   Sexual activity: Yes    Partners: Male  Birth control/protection: Condom    Comment: 1 partner  Other Topics Concern   Not on file  Social History Narrative   Born [place], [place] in 1998 when her mother got a job in one of the [place].   Now living with her father.  Sometimes with her sister.   Parents separated.   Social Determinants of Health   Financial Resource Strain: High Risk (07/20/2021)   Overall Financial Resource Strain (CARDIA)    Difficulty of Paying Living Expenses: Very hard  Food Insecurity: Food Insecurity Present (07/20/2021)   Hunger Vital Sign    Worried About Running Out of Food in the Last Year: Often true    Ran Out of Food in the Last Year: Often true  Transportation Needs: Unmet Transportation Needs (07/20/2021)   PRAPARE - Administrator, Civil Service (Medical): Yes    Lack of Transportation (Non-Medical): Yes  Physical Activity: Inactive (07/20/2021)   Exercise Vital Sign    Days of Exercise per Week: 0 days    Minutes of Exercise per Session: 0 min  Stress: Stress Concern Present (07/20/2021)   [employer] of Occupational Health - Occupational Stress Questionnaire    Feeling of Stress : Very much  Social Connections: Socially Isolated (07/20/2021)   Social Connection and Isolation Panel [NHANES]    Frequency of Communication with Friends and Family: Once a week    Frequency of Social Gatherings with Friends and Family: Never    Attends Religious Services: 1 to 4 times per year    Active Member of [employer] or Organizations: No    Attends Banker Meetings: Never    Marital Status: Never  married    Allergies:  Allergies  Allergen Reactions   Depakene [Divalproex Sodium] Other (See Comments)    "made me feel like I was going to pass out"    Metabolic Disorder Labs: Lab Results  Component Value Date   HGBA1C 6.0 (H) 02/21/2015   MPG 126 02/21/2015   No results found for: "PROLACTIN" Lab Results  Component Value Date   CHOL 138 02/21/2015   TRIG 60 02/21/2015   HDL 51 02/21/2015   CHOLHDL 2.7 02/21/2015   VLDL 12 02/21/2015   LDLCALC 75 02/21/2015   LDLCALC 86 04/03/2009   Lab Results  Component Value Date   TSH 0.299 (L) 02/20/2015   TSH 0.519 05/10/2010    Therapeutic Level Labs: No results found for: "LITHIUM" No results found for: "VALPROATE" Lab Results  Component Value Date   CBMZ 7.1 02/21/2015    Current Medications: Current Outpatient Medications  Medication Sig Dispense Refill   ARIPiprazole ER (ABILIFY ASIMTUFII) 960 MG/3.2ML PRSY Inject 960 mg into the muscle every 2 months. 3.2 mL 11   buprenorphine-naloxone (SUBOXONE) 8-2 mg SUBL SL tablet Place 1 tablet under the tongue 2 (two) times daily. 28 tablet 1   ciprofloxacin (CIPRO) 250 MG tablet Take 1 tablet (250 mg total) by mouth 2 (two) times daily. 6 tablet 0   hydrOXYzine (ATARAX) 25 MG tablet Take 1 tablet (25 mg total) by mouth 3 (three) times daily as needed for anxiety or itching. 90 tablet 3   ibuprofen (ADVIL,MOTRIN) 200 MG tablet Take 800 mg by mouth every 6 (six) hours as needed for headache or moderate pain.     mirtazapine (REMERON) 15 MG tablet Take 1 tablet (15 mg total) by mouth at bedtime. 30 tablet 3  senna (SENOKOT) 8.6 MG TABS tablet Take 1 tablet by mouth daily. 30 tablet 1   No current facility-administered medications for this visit.     Musculoskeletal: Strength & Muscle Tone: within normal limits Gait & Station: normal Patient leans: N/A  Psychiatric Specialty Exam: Review of Systems  Psychiatric/Behavioral:  Positive for dysphoric mood and  hallucinations. Negative for suicidal ideas. The patient is nervous/anxious.     Blood pressure (!) 119/98, pulse 86, resp. rate 12, weight 128 lb (58.1 kg).Body mass index is 17.85 kg/m.  General Appearance: Casual  Eye Contact:  Good  Speech:  Clear and Coherent  Volume:  Normal  Mood:  Dysphoric  Affect:  Congruent  Thought Process:  Coherent  Orientation:  Full (Time, Place, and Person)  Thought Content: Logical   Suicidal Thoughts:  No  Homicidal Thoughts:  No  Memory:  Immediate;   Good Recent;   Good  Judgement:  Good  Insight:  Good  Psychomotor Activity:  Normal  Concentration:  Concentration: Good  Recall:  NA  Fund of Knowledge: Fair  Language: Fair  Akathisia:  NA  Handed:    AIMS (if indicated): not done  Assets:  Manufacturing systems engineer Desire for Improvement Housing Resilience Social Support  ADL's:  Intact  Cognition: WNL  Sleep:  Fair   Screenings: AIMS    Flowsheet Row [facility] ([facility]) [facility]  AIMS Total Score 0 0 0      AUDIT    Flowsheet Row Counselor from 07/20/2021 in [facility] Ad[facility] (Discharged) [facility] (Dis[facility] ([facility]) [facility]  Alcohol Use Disorder Identification Test Final Score (AUDIT) 7 3 18 1       GAD-7    Flowsheet Row Office Visit from 02/07/2023 in [facility] Office Visit from 08/03/2022 in [facility] Office Visit from 11/09/2021 in [facility] Counselor from 08/09/2021 in [facility] Counselor from 07/20/2021 in [facility]  Total GAD-7 Score 21 20 7 13 18       PHQ2-9    Flowsheet Row Office Visit from 02/07/2023 in [facility] Office Visit from 08/03/2022 in [facility] Office Visit from 05/05/2022 in [facility] Office Visit from 11/09/2021 in [facility] Office Visit from 10/05/2021 in [facility]  PHQ-2 Total Score 6 4 0 2 3  PHQ-9 Total Score 27 21 -- 9 11      Flowsheet Row Office Visit from 02/07/2023 in [facility] Office Visit from 05/05/2022 in [facility] ED from 09/23/2021 in [facility] Department at [facility]  C-SSRS RISK CATEGORY Error: Q7 should not be populated when Q6 is No No Risk No Risk        Assessment and Plan: Patient does endorse dysphoric mood and continued anxiety. Will dc tegretol as it may be inducing Abilify and patient is likely not able to get what she needs out of the medicine. Will start patient back on Remeron to address mood, sleep, and, appetite.  Safety  planning done due to patient endorsing occasional passive SI. Patient also appears to benefiting with more overall stability in mood and behaviors due to substance use cessation.   No refills needed today.  Bipolar disorder type I, current episode mixed - Continue Abilify Asimtufii 960 mg, will reassess at next visit - Continue Abilify p.o. 2 mg daily - Start Vistaril 25mg  TID PRN - Decrease Tegretol 200mg  daily x 3 days then stop - Start remeron 15mg  QHS, helps with appetite and sleep in patient in the past.   Substance abuse hx - On Suboxone 8-2mg  TID, rx by [facility]  Collaboration of Care: Collaboration of Care:   Patient/Guardian was advised Release of Information must be obtained prior to any record  release in order to collaborate their care with an outside provider. Patient/Guardian was advised if they have not already done so to contact the registration department to sign all necessary forms in order for [place] to release information regarding their care.   Consent: Patient/Guardian gives verbal consent for treatment and assignment of benefits for services provided during this visit. Patient/Guardian expressed understanding and agreed to proceed.   PGY-3 Bobbye Morton, MD 02/22/2023, 6:18 PM

## 2023-04-05 NOTE — Progress Notes (Signed)
Assigning Dr. Lucianne Muss for signature.

## 2023-04-11 ENCOUNTER — Ambulatory Visit (INDEPENDENT_AMBULATORY_CARE_PROVIDER_SITE_OTHER): Payer: Medicaid Other

## 2023-04-11 ENCOUNTER — Encounter (HOSPITAL_COMMUNITY): Payer: Self-pay

## 2023-04-11 VITALS — BP 122/78 | HR 103 | Ht 71.0 in | Wt 142.0 lb

## 2023-04-11 DIAGNOSIS — F200 Paranoid schizophrenia: Secondary | ICD-10-CM

## 2023-04-11 DIAGNOSIS — F411 Generalized anxiety disorder: Secondary | ICD-10-CM

## 2023-04-11 DIAGNOSIS — G4700 Insomnia, unspecified: Secondary | ICD-10-CM

## 2023-04-11 MED ORDER — ARIPIPRAZOLE ER 960 MG/3.2ML IM PRSY
960.0000 mg | PREFILLED_SYRINGE | Freq: Once | INTRAMUSCULAR | Status: AC
  Administered 2023-04-11: 960 mg via INTRAMUSCULAR

## 2023-04-11 NOTE — Progress Notes (Signed)
PATIENT PRESENTS TO THE OFFICE FOR ABILIFY ASIMTUFII 960 INJECTION GIVEN IN THE RIGHT UPPER GLUT BY Tomasa Dobransky .  PATIENT TOLERATED  WELL

## 2023-04-28 ENCOUNTER — Ambulatory Visit (HOSPITAL_COMMUNITY): Payer: Medicaid Other | Admitting: Student

## 2023-05-01 NOTE — Progress Notes (Cosign Needed)
PATIENT PRESENTS TO THE OFFICE FOR ABILIFY ASIMTUFII 960 INJECTION GIVEN IN THE RIGHT UPPER GLUT BY DONNA .  PATIENT TOLERATED  WELL  

## 2023-05-05 ENCOUNTER — Other Ambulatory Visit (HOSPITAL_COMMUNITY): Payer: Self-pay | Admitting: Psychiatry

## 2023-05-05 DIAGNOSIS — F3162 Bipolar disorder, current episode mixed, moderate: Secondary | ICD-10-CM

## 2023-06-13 ENCOUNTER — Ambulatory Visit (INDEPENDENT_AMBULATORY_CARE_PROVIDER_SITE_OTHER): Payer: MEDICAID | Admitting: Psychiatry

## 2023-06-13 ENCOUNTER — Ambulatory Visit (HOSPITAL_COMMUNITY): Payer: MEDICAID | Admitting: *Deleted

## 2023-06-13 ENCOUNTER — Encounter (HOSPITAL_COMMUNITY): Payer: Self-pay

## 2023-06-13 VITALS — BP 139/92 | HR 96 | Resp 16 | Ht 71.0 in | Wt 143.8 lb

## 2023-06-13 DIAGNOSIS — F411 Generalized anxiety disorder: Secondary | ICD-10-CM

## 2023-06-13 DIAGNOSIS — F200 Paranoid schizophrenia: Secondary | ICD-10-CM | POA: Diagnosis not present

## 2023-06-13 DIAGNOSIS — F3162 Bipolar disorder, current episode mixed, moderate: Secondary | ICD-10-CM

## 2023-06-13 MED ORDER — ARIPIPRAZOLE ER 960 MG/3.2ML IM PRSY
960.0000 mg | PREFILLED_SYRINGE | Freq: Once | INTRAMUSCULAR | Status: AC
Start: 2023-06-13 — End: 2023-06-13
  Administered 2023-06-13: 960 mg via INTRAMUSCULAR

## 2023-06-13 MED ORDER — ARIPIPRAZOLE 2 MG PO TABS: 2.0000 mg | ORAL_TABLET | Freq: Every day | ORAL | 3 refills | Status: DC

## 2023-06-13 MED ORDER — MIRTAZAPINE 15 MG PO TABS
15.0000 mg | ORAL_TABLET | Freq: Every day | ORAL | 3 refills | Status: DC
Start: 2023-06-13 — End: 2023-08-10

## 2023-06-13 MED ORDER — HYDROXYZINE HCL 25 MG PO TABS
25.0000 mg | ORAL_TABLET | Freq: Three times a day (TID) | ORAL | 3 refills | Status: DC | PRN
Start: 2023-06-13 — End: 2023-06-13

## 2023-06-13 MED ORDER — ARIPIPRAZOLE 2 MG PO TABS
2.0000 mg | ORAL_TABLET | Freq: Every day | ORAL | 3 refills | Status: DC
Start: 2023-06-13 — End: 2023-06-13

## 2023-06-13 MED ORDER — HYDROXYZINE HCL 25 MG PO TABS
25.0000 mg | ORAL_TABLET | Freq: Three times a day (TID) | ORAL | 3 refills | Status: DC | PRN
Start: 2023-06-13 — End: 2023-12-07

## 2023-06-13 MED ORDER — ABILIFY ASIMTUFII 960 MG/3.2ML IM PRSY
960.0000 mg | PREFILLED_SYRINGE | INTRAMUSCULAR | 11 refills | Status: AC
Start: 2023-06-13 — End: ?

## 2023-06-13 MED ORDER — ABILIFY ASIMTUFII 960 MG/3.2ML IM PRSY
960.0000 mg | PREFILLED_SYRINGE | INTRAMUSCULAR | 11 refills | Status: DC
Start: 2023-06-13 — End: 2023-06-13

## 2023-06-13 MED ORDER — MIRTAZAPINE 15 MG PO TABS: 15.0000 mg | ORAL_TABLET | Freq: Every day | ORAL | 3 refills | Status: DC

## 2023-06-13 NOTE — Progress Notes (Signed)
Patient arrived for injection of Abilify Asimtufii 960mg . States that medication is working great for her, she is very happy that she has it. No issues or complaints. Given in Left Upper Outer Quadrant.

## 2023-06-13 NOTE — Progress Notes (Signed)
BH MD/PA/NP OP Progress Note  06/13/2023 10:39 AM Natalie Fischer  MRN:  [id number]  Chief Complaint: " I have been having problems with memory and anxiousness"  HPI:  42 year old female seen today for follow-up psychiatric evaluation.  She has a psychiatric history of anxiety, depression, bipolar disorder, substance use (cocaine, marijuana, opioids, and benzos).  Currently she is managed on Abilify Asimtufii 960 mg every 2 months, mirtazapine 15 mg nightly, hydroxyzine 25 mg three times daily as needed, Tegretol 200 mg twice daily and Abilify 2 mg daily.  She informed Clinical research associate that she was not given Abilify 2 mg at her last visit and notes that she discontinued Tegretol.  She reports her other medications are effective in managing her psychiatric conditions.    Today she is well-groomed, pleasant, cooperative, and engaged in conversation.  She informed Clinical research associate that she has been having problems with her memory.  She notes that she is forgetful and disorganized.  She notes that she will play something down and forget where she put it.  She also informed Clinical research associate while reading she will not be able to call what she read.  She notes that this has been going on for over a year.  Provider informed patient that she could be referred to neurology.  She however notes that she does not want a referral.  Since her last visit she informed writer that her anxiety and depression has improved.  Today provider conducted a GAD-7 and patient scored a 16, at her last visit with writer she scored 21.  Provider also conducted PHQ-9 and patient scored a 12, at her last visit she scored a 27.  She endorses adequate sleep and increased appetite.  Patient notes that since her last visit she has gained over 15 pounds.  She notes that she likes her current weight.  Today she denies SI/HI/AH.  She does endorse visual hallucinations noting that she sees bugs.    Patient informed Clinical research associate that she has been concerned about her living  arrangements.  Patient currently lives in [facility].  She has a voucher for housing however notes that her apartment complex did not pass inspection.  She is hopeful that this will be completed soon so that she can move into her home.  Patient continues to work at [employer] and find enjoyment in her job.  Today provider conducted an aims assessment and patient scored a 0.  Today patient agreeable to restarting Abilify 2 mg daily to help manage symptoms of psychosis.  She informed Clinical research associate that she has not been taking hydroxyzine regularly and notes that she will start taking it to help manage her anxiety.  She will continue her other medications as prescribed.  No other concerns noted at this time. Visit Diagnosis:    ICD-10-CM   1. Bipolar 1 disorder, mixed, moderate (HCC)  F31.62 ARIPiprazole ER (ABILIFY ASIMTUFII) 960 MG/3.2ML PRSY    ARIPiprazole (ABILIFY) 2 MG tablet    2. Generalized anxiety disorder  F41.1 hydrOXYzine (ATARAX) 25 MG tablet    mirtazapine (REMERON) 15 MG tablet       Past Psychiatric History: anxiety, depression, bipolar disorder, substance use (cocaine, marijuana, opioids, and benzos)  Past Medical History:  Past Medical History:  Diagnosis Date   Anxiety    Bipolar 1 disorder (HCC)    Carpal tunnel syndrome of right wrist 2016   Chlamydia    Constipation    Ectopic pregnancy 2008 and 2014   2008 required surgery, 2nd Misoprostol   GERD (  gastroesophageal reflux disease)    Insomnia    Trichomonas vaginitis    UTI (lower urinary tract infection)     Past Surgical History:  Procedure Laterality Date   ECTOPIC PREGNANCY SURGERY Right 2008    Family Psychiatric History: Sister: Depression/attempted suicide in past, Paternal aunt: Bipolar-attempted suicide in past   Family History:  Family History  Problem Relation Age of Onset   Cancer Sister 80   Healthy Mother    Healthy Father    COPD Sister    Alcohol abuse Neg Hx    Arthritis Neg Hx    Asthma Neg Hx     Birth defects Neg Hx    Depression Neg Hx    Diabetes Neg Hx    Drug abuse Neg Hx    Early death Neg Hx    Hearing loss Neg Hx    Heart disease Neg Hx    Hyperlipidemia Neg Hx    Hypertension Neg Hx    Kidney disease Neg Hx    Learning disabilities Neg Hx    Mental illness Neg Hx    Mental retardation Neg Hx    Miscarriages / Stillbirths Neg Hx    Stroke Neg Hx    Vision loss Neg Hx    Varicose Veins Neg Hx     Social History:  Social History   Socioeconomic History   Marital status: Single    Spouse name: Not on file   Number of children: 0   Years of education: college-1   Highest education level: Not on file  Occupational History   Occupation: unemployed    Comment: Previously worked [employer], Clinical biochemist.  Tobacco Use   Smoking status: Some Days    Current packs/day: 0.50    Average packs/day: 0.5 packs/day for 24.1 years (12.0 ttl pk-yrs)    Types: Cigarettes    Start date: 05/15/1999   Smokeless tobacco: Never   Tobacco comments:    0.5 PPD, has now started vaping instead (05/05/22)  Vaping Use   Vaping status: Never Used  Substance and Sexual Activity   Alcohol use: Yes    Comment: occasionally   Drug use: Not Currently    Comment: Last used June 2019. Marijuana: On Saturday   Sexual activity: Yes    Partners: Male    Birth control/protection: Condom    Comment: 1 partner  Other Topics Concern   Not on file  Social History Narrative   Born in [place], [place], in 1998 when her mother got a job in one of the [place].   Now living with her father.  Sometimes with her sister.   Parents separated.   Social Determinants of Health   Financial Resource Strain: High Risk (07/20/2021)   Overall Financial Resource Strain (CARDIA)    Difficulty of Paying Living Expenses: Very hard  Food Insecurity: Food Insecurity Present (07/20/2021)   Hunger Vital Sign    Worried About Running Out of Food in the Last Year:  Often true    Ran Out of Food in the Last Year: Often true  Transportation Needs: Unmet Transportation Needs (07/20/2021)   PRAPARE - Administrator, Civil Service (Medical): Yes    Lack of Transportation (Non-Medical): Yes  Physical Activity: Inactive (07/20/2021)   Exercise Vital Sign    Days of Exercise per Week: 0 days    Minutes of Exercise per Session: 0 min  Stress: Stress Concern Present (  07/20/2021)   [employer] of Occupational Health - Occupational Stress Questionnaire    Feeling of Stress : Very much  Social Connections: Socially Isolated (07/20/2021)   Social Connection and Isolation Panel [NHANES]    Frequency of Communication with Friends and Family: Once a week    Frequency of Social Gatherings with Friends and Family: Never    Attends Religious Services: 1 to 4 times per year    Active Member of [employer] or Organizations: No    Attends Banker Meetings: Never    Marital Status: Never married    Allergies:  Allergies  Allergen Reactions   Depakene [Divalproex Sodium] Other (See Comments)    "made me feel like I was going to pass out"    Metabolic Disorder Labs: Lab Results  Component Value Date   HGBA1C 6.0 (H) 02/21/2015   MPG 126 02/21/2015   No results found for: "PROLACTIN" Lab Results  Component Value Date   CHOL 138 02/21/2015   TRIG 60 02/21/2015   HDL 51 02/21/2015   CHOLHDL 2.7 02/21/2015   VLDL 12 02/21/2015   LDLCALC 75 02/21/2015   LDLCALC 86 04/03/2009   Lab Results  Component Value Date   TSH 0.299 (L) 02/20/2015   TSH 0.519 05/10/2010    Therapeutic Level Labs: No results found for: "LITHIUM" No results found for: "VALPROATE" Lab Results  Component Value Date   CBMZ 7.1 02/21/2015    Current Medications: Current Outpatient Medications  Medication Sig Dispense Refill   ARIPiprazole (ABILIFY) 2 MG tablet Take 1 tablet (2 mg total) by mouth daily. 30 tablet 3   ARIPiprazole ER (ABILIFY ASIMTUFII) 960  MG/3.2ML PRSY Inject 960 mg into the muscle every 2 months. 3.2 mL 11   buprenorphine-naloxone (SUBOXONE) 8-2 mg SUBL SL tablet Place 1 tablet under the tongue 2 (two) times daily. 28 tablet 1   ciprofloxacin (CIPRO) 250 MG tablet Take 1 tablet (250 mg total) by mouth 2 (two) times daily. 6 tablet 0   hydrOXYzine (ATARAX) 25 MG tablet Take 1 tablet (25 mg total) by mouth 3 (three) times daily as needed for anxiety or itching. 90 tablet 3   ibuprofen (ADVIL,MOTRIN) 200 MG tablet Take 800 mg by mouth every 6 (six) hours as needed for headache or moderate pain.     mirtazapine (REMERON) 15 MG tablet Take 1 tablet (15 mg total) by mouth at bedtime. 30 tablet 3   senna (SENOKOT) 8.6 MG TABS tablet Take 1 tablet by mouth daily. 30 tablet 1   No current facility-administered medications for this visit.     Musculoskeletal: Strength & Muscle Tone: within normal limits Gait & Station: normal Patient leans: N/A  Psychiatric Specialty Exam: Review of Systems  There were no vitals taken for this visit.There is no height or weight on file to calculate BMI.  General Appearance: Well Groomed  Eye Contact:  Good  Speech:  Clear and Coherent and Normal Rate  Volume:  Normal  Mood:  Anxious and Depressed, improving  Affect:  Appropriate and Congruent  Thought Process:  Coherent, Goal Directed, and Linear  Orientation:  Full (Time, Place, and Person)  Thought Content: Logical and Hallucinations: Visual   Suicidal Thoughts:  No  Homicidal Thoughts:  No  Memory:  Immediate;   Good Recent;   Good Remote;   Good  Judgement:  Good  Insight:  Good  Psychomotor Activity:  Normal  Concentration:  Concentration: Good and Attention Span: Good  Recall:  Good  Fund of Knowledge: Good  Language: Good  Akathisia:  No  Handed:  Right  AIMS (if indicated): done  Assets:  Communication Skills Desire for Improvement Financial Resources/Insurance Intimacy Leisure Time Physical Health Social Support   ADL's:  Intact  Cognition: WNL  Sleep:  Good   Screenings: AIMS    Flowsheet Row Office Visit from 06/13/2023 in [facility]) [facility]) [facility]) [facility]  AIMS Total Score 0 0 0 0      AUDIT    Flowsheet Row Counselor from 07/20/2021 in [facility] Admission (Discharged) [facility]) [facility]  Alcohol Use Disorder Identification Test Final Score (AUDIT) 7 3 18 1       GAD-7    Flowsheet Row Office Visit from 06/13/2023 in [facility] Most recent reading at 06/13/2023 10:35 AM Clinical Support from 06/13/2023 in [facility] Most recent reading at 06/13/2023 10:23 AM Office Visit from 02/07/2023 in [facility] Most recent reading at 02/07/2023  1:36 PM Office Visit from 08/03/2022 in [facility] Most recent reading at 08/03/2022  3:48 PM Office Visit from 11/09/2021 in [facility] Most recent reading at 11/09/2021 11:33 AM  Total GAD-7 Score 16 16 21 20 7       PHQ2-9    Flowsheet Row Office Visit from 06/13/2023 in [facility] Most recent reading at 06/13/2023 10:35 AM Clinical Support from 06/13/2023 in [facility] Most recent reading at 06/13/2023 10:22 AM Office Visit from 02/07/2023 in [facility] Most recent reading at 02/07/2023  1:35 PM Office Visit from 08/03/2022 in  [facility] Most recent reading at 08/03/2022  3:46 PM Office Visit from 05/05/2022 in [facility] Most recent reading at 05/05/2022  8:56 AM  PHQ-2 Total Score 4 4 6 4  0  PHQ-9 Total Score 12 12 27 21  --      Flowsheet Row Office Visit from 02/07/2023 in [facility] Office Visit from 05/05/2022 in [facility] ED from 09/23/2021 in [facility] Emergency Department at [facility]  C-SSRS RISK CATEGORY Error: Q7 should not be populated when Q6 is No No Risk No Risk        Assessment and Plan: Patient reports that her anxiety,, appetite, and weight has improved since her last visit.  She continues to have visual hallucinations. Today patient agreeable to restarting Abilify 2 mg daily to help manage symptoms of psychosis.  She informed Clinical research associate that she has not been taking hydroxyzine regularly and notes that she will start taking it to help manage her anxiety.  She will continue her other medications as prescribed.  Patient complains of memory defects.  At this time she does not wish to be referred to neurology.  1. Generalized anxiety disorder  Continue- hydrOXYzine (ATARAX) 25 MG tablet; Take 1 tablet (25 mg total) by mouth 3 (three) times daily as needed for anxiety or itching.  Dispense: 90 tablet; Refill: 3 Continue- mirtazapine (REMERON) 15 MG tablet; Take 1  tablet (15 mg total) by mouth at bedtime.  Dispense: 30 tablet; Refill: 3  2. Bipolar 1 disorder, mixed, moderate (HCC)  Continue- ARIPiprazole ER PRSY 960 mg Continue- ARIPiprazole ER (ABILIFY ASIMTUFII) 960 MG/3.2ML PRSY; Inject 960 mg into the muscle every 2 months.  Dispense: 3.2 mL; Refill: 11 Start- ARIPiprazole (ABILIFY) 2 MG tablet; Take 1 tablet (2 mg total) by mouth daily.  Dispense: 30 tablet; Refill: 3   Collaboration of Care: Collaboration of Care: Other provider involved in patient's care AEB PCP and  shot clinic staff  Patient/Guardian was advised Release of Information must be obtained prior to any record release in order to collaborate their care with an outside provider. Patient/Guardian was advised if they have not already done so to contact the registration department to sign all necessary forms in order for [place] to release information regarding their care.   Consent: Patient/Guardian gives verbal consent for treatment and assignment of benefits for services provided during this visit. Patient/Guardian expressed understanding and agreed to proceed.   Follow-up in 2 months Follow-up with shot clinic staff  Shanna Cisco, NP 06/13/2023, 10:39 AM

## 2023-06-28 ENCOUNTER — Encounter (HOSPITAL_COMMUNITY): Payer: Self-pay

## 2023-06-30 ENCOUNTER — Ambulatory Visit (HOSPITAL_COMMUNITY): Payer: Medicaid Other | Admitting: Student

## 2023-07-25 ENCOUNTER — Other Ambulatory Visit (HOSPITAL_COMMUNITY)
Admission: RE | Admit: 2023-07-25 | Discharge: 2023-07-25 | Disposition: A | Discharge status: Home / Self Care | Payer: MEDICAID | Source: Ambulatory Visit | Attending: Physician Assistant | Admitting: Physician Assistant

## 2023-07-25 ENCOUNTER — Ambulatory Visit: Payer: MEDICAID | Admitting: Physician Assistant

## 2023-07-25 ENCOUNTER — Encounter: Payer: Self-pay | Admitting: Physician Assistant

## 2023-07-25 VITALS — BP 117/77 | HR 90 | Ht 71.0 in | Wt 150.4 lb

## 2023-07-25 DIAGNOSIS — Z113 Encounter for screening for infections with a predominantly sexual mode of transmission: Secondary | ICD-10-CM | POA: Insufficient documentation

## 2023-07-25 DIAGNOSIS — Z1231 Encounter for screening mammogram for malignant neoplasm of breast: Secondary | ICD-10-CM

## 2023-07-25 DIAGNOSIS — Z0000 Encounter for general adult medical examination without abnormal findings: Secondary | ICD-10-CM

## 2023-07-25 DIAGNOSIS — Z124 Encounter for screening for malignant neoplasm of cervix: Secondary | ICD-10-CM

## 2023-07-25 DIAGNOSIS — B9689 Other specified bacterial agents as the cause of diseases classified elsewhere: Secondary | ICD-10-CM | POA: Diagnosis not present

## 2023-07-25 DIAGNOSIS — N760 Acute vaginitis: Secondary | ICD-10-CM

## 2023-07-25 DIAGNOSIS — A539 Syphilis, unspecified: Secondary | ICD-10-CM

## 2023-07-25 MED ORDER — METRONIDAZOLE 500 MG PO TABS
500.0000 mg | ORAL_TABLET | Freq: Two times a day (BID) | ORAL | 0 refills | Status: AC
Start: 2023-07-25 — End: 2023-08-01

## 2023-07-25 NOTE — Progress Notes (Unsigned)
New Patient Office Visit  Subjective    Patient ID: Natalie Fischer, female    DOB: 1981-02-16  Age: 42 y.o. MRN: [id number]  CC:  Chief Complaint  Patient presents with  . Std screening   . Gynecologic Exam    HPI Natalie Fischer many years  No concerns  Discharge  - white thin watery - couple no known exposures Nothing No odor 2 weeks ago No mammogram - does breast check in shower   Just got medicaid -     Outpatient Encounter Medications as of 07/25/2023  Medication Sig  . ARIPiprazole (ABILIFY) 2 MG tablet Take 1 tablet (2 mg total) by mouth daily.  . ARIPiprazole ER (ABILIFY ASIMTUFII) 960 MG/3.2ML PRSY Inject 960 mg into the muscle every 2 months.  . buprenorphine-naloxone (SUBOXONE) 8-2 mg SUBL SL tablet Place 1 tablet under the tongue 2 (two) times daily.  . DENTA 5000 PLUS 1.1 % CREA dental cream Take 1 Application by mouth daily.  . hydrOXYzine (ATARAX) 25 MG tablet Take 1 tablet (25 mg total) by mouth 3 (three) times daily as needed for anxiety or itching.  . mirtazapine (REMERON) 15 MG tablet Take 1 tablet (15 mg total) by mouth at bedtime.  . Prenatal Vit-Fe Fumarate-FA (M-NATAL PLUS) 27-1 MG TABS Take 1 tablet by mouth daily.  Marland Kitchen senna (SENOKOT) 8.6 MG TABS tablet Take 1 tablet by mouth daily.  . ciprofloxacin (CIPRO) 250 MG tablet Take 1 tablet (250 mg total) by mouth 2 (two) times daily. (Patient not taking: Reported on 07/25/2023)  . ibuprofen (ADVIL,MOTRIN) 200 MG tablet Take 800 mg by mouth every 6 (six) hours as needed for headache or moderate pain. (Patient not taking: Reported on 07/25/2023)  . [DISCONTINUED] pantoprazole (PROTONIX) 20 MG tablet Take 1 tablet (20 mg total) by mouth daily. For acid reflux (Patient not taking: Reported on 01/02/2019)   No facility-administered encounter medications on file as of 07/25/2023.    Past Medical History:  Diagnosis Date  . Anxiety   . Bipolar 1 disorder (HCC)   . Carpal tunnel syndrome of right wrist 2016   . Chlamydia   . Constipation   . Ectopic pregnancy 2008 and 2014   2008 required surgery, 2nd Misoprostol  . GERD (gastroesophageal reflux disease)   . Insomnia   . Trichomonas vaginitis   . UTI (lower urinary tract infection)     Past Surgical History:  Procedure Laterality Date  . ECTOPIC PREGNANCY SURGERY Right 2008    Family History  Problem Relation Age of Onset  . Cancer Sister 9  . Healthy Mother   . Healthy Father   . COPD Sister   . Alcohol abuse Neg Hx   . Arthritis Neg Hx   . Asthma Neg Hx   . Birth defects Neg Hx   . Depression Neg Hx   . Diabetes Neg Hx   . Drug abuse Neg Hx   . Early death Neg Hx   . Hearing loss Neg Hx   . Heart disease Neg Hx   . Hyperlipidemia Neg Hx   . Hypertension Neg Hx   . Kidney disease Neg Hx   . Learning disabilities Neg Hx   . Mental illness Neg Hx   . Mental retardation Neg Hx   . Miscarriages / Stillbirths Neg Hx   . Stroke Neg Hx   . Vision loss Neg Hx   . Varicose Veins Neg Hx     Social History   Socioeconomic  History  . Marital status: Single    Spouse name: Not on file  . Number of children: 0  . Years of education: college-1  . Highest education level: Not on file  Occupational History  . Occupation: unemployed    Comment: Previously worked [employer], Clinical biochemist.  Tobacco Use  . Smoking status: Former    Current packs/day: 0.50    Average packs/day: 0.5 packs/day for 24.2 years (12.1 ttl pk-yrs)    Types: Cigarettes    Start date: 05/15/1999  . Smokeless tobacco: Never  . Tobacco comments:    0.5 PPD, has now started vaping instead (05/05/22)  Vaping Use  . Vaping status: Every Day  . Substances: Nicotine, THC, CBD  Substance and Sexual Activity  . Alcohol use: Yes    Comment: occasionally  . Drug use: Not Currently    Comment: Last used June 2019. Marijuana: On Saturday  . Sexual activity: Yes    Partners: Male    Birth control/protection: Condom    Comment: 1 partner  Other Topics  Concern  . Not on file  Social History Narrative   [place], [place], in 1998 when her mother got a job in one of the mills here.   Now living with her father.  Sometimes with her sister.   Parents separated.   Social Determinants of Health   Financial Resource Strain: High Risk (07/20/2021)   Overall Financial Resource Strain (CARDIA)   . Difficulty of Paying Living Expenses: Very hard  Food Insecurity: Food Insecurity Present (07/20/2021)   Hunger Vital Sign   . Worried About Programme researcher, broadcasting/film/video in the Last Year: Often true   . Ran Out of Food in the Last Year: Often true  Transportation Needs: Unmet Transportation Needs (07/20/2021)   PRAPARE - Transportation   . Lack of Transportation (Medical): Yes   . Lack of Transportation (Non-Medical): Yes  Physical Activity: Inactive (07/20/2021)   Exercise Vital Sign   . Days of Exercise per Week: 0 days   . Minutes of Exercise per Session: 0 min  Stress: Stress Concern Present (07/20/2021)   [employer] of Occupational Health - Occupational Stress Questionnaire   . Feeling of Stress : Very much  Social Connections: Socially Isolated (07/20/2021)   Social Connection and Isolation Panel [NHANES]   . Frequency of Communication with Friends and Family: Once a week   . Frequency of Social Gatherings with Friends and Family: Never   . Attends Religious Services: 1 to 4 times per year   . Active Member of Clubs or Organizations: No   . Attends Banker Meetings: Never   . Marital Status: Never married  Intimate Partner Violence: Not At Risk (07/20/2021)   Humiliation, Afraid, Rape, and Kick questionnaire   . Fear of Current or Ex-Partner: No   . Emotionally Abused: No   . Physically Abused: No   . Sexually Abused: No    ROS      Objective    BP 117/77 (BP Location: Left Arm, Patient Position: Sitting, Cuff Size: Normal)   Pulse 90   Ht 5\' 11"  (1.803 m)   Wt 150 lb 6.4 oz  (68.2 kg)   LMP 07/13/2023   SpO2 98%   BMI 20.98 kg/m   Physical Exam  {Labs (Optional):23779}    Assessment & Plan:   Problem List Items Addressed This Visit   None   No follow-ups on  file.   Kasandra Knudsen Mayers, PA-C

## 2023-07-25 NOTE — Patient Instructions (Signed)
You are going to take metronidazole twice daily for 7 days.  I started a referral for you to have a mammogram completed, they will contact you to schedule.  We will call you with today's lab results.  I do encourage you to call and schedule an appointment to establish care with your primary care provider.  Please feel free to return to the mobile unit at any time  Roney Jaffe, PA-C Physician Assistant [facility] [email or web address]   Bacterial Vaginosis  Bacterial vaginosis is an infection that occurs when the normal balance of bacteria in the vagina changes. This change is caused by an overgrowth of certain bacteria in the vagina. Bacterial vaginosis is the most common vaginal infection among females aged 33 to 25 years. This condition increases the risk of sexually transmitted infections (STIs). Treatment can help reduce this risk. Treatment is very important for pregnant women because this condition can cause babies to be born early (prematurely) or at a low birth weight. What are the causes? This condition is caused by an increase in harmful bacteria that are normally present in small amounts in the vagina. However, the exact reason this condition develops is not known. You cannot get bacterial vaginosis from toilet seats, bedding, swimming pools, or contact with objects around you. What increases the risk? The following factors may make you more likely to develop this condition: Having a new sexual partner or multiple sexual partners, or having unprotected sex. Douching. Having an intrauterine device (IUD). Smoking. Abusing drugs and alcohol. This may lead to riskier sexual behavior. Taking certain antibiotic medicines. Being pregnant. What are the signs or symptoms? Some women with this condition have no symptoms. Symptoms may include: Wallace Cullens or white vaginal discharge. The discharge can be watery or foamy. A fish-like  odor with discharge, especially after sex or during menstruation. Itching in and around the vagina. Burning or pain with urination. How is this diagnosed? This condition is diagnosed based on: Your medical history. A physical exam of the vagina. Checking a sample of vaginal fluid for harmful bacteria or abnormal cells. How is this treated? This condition is treated with antibiotic medicines. These may be given as a pill, a vaginal cream, or a medicine that is put into the vagina (suppository). If the condition comes back after treatment, a second round of antibiotics may be needed. Follow these instructions at home: Medicines Take or apply over-the-counter and prescription medicines only as told by your health care provider. Take or apply your antibiotic medicine as told by your health care provider. Do not stop using the antibiotic even if you start to feel better. General instructions If you have a female sexual partner, tell her that you have a vaginal infection. She should follow up with her health care provider. If you have a female sexual partner, he does not need treatment. Avoid sexual activity until you finish treatment. Drink enough fluid to keep your urine pale yellow. Keep the area around your vagina and rectum clean. Wash the area daily with warm water. Wipe yourself from front to back after using the toilet. If you are breastfeeding, talk to your health care provider about continuing breastfeeding during treatment. Keep all follow-up visits. This is important. How is this prevented? Self-care Do not douche. Wash the outside of your vagina with warm water only. Wear cotton or cotton-lined underwear. Avoid wearing tight pants and pantyhose, especially during the summer. Safe sex Use protection when having sex. This includes: Using condoms. Using dental  dams. This is a thin layer of a material made of latex or polyurethane that protects the mouth during oral sex. Limit the  number of sexual partners. To help prevent bacterial vaginosis, it is best to have sex with just one partner (monogamous relationship). Make sure you and your sexual partner are tested for STIs. Drugs and alcohol Do not use any products that contain nicotine or tobacco. These products include cigarettes, chewing tobacco, and vaping devices, such as e-cigarettes. If you need help quitting, ask your health care provider. Do not use drugs. Do not drink alcohol if: Your health care provider tells you not to do this. You are pregnant, may be pregnant, or are planning to become pregnant. If you drink alcohol: Limit how much you have to 0-1 drink a day. Be aware of how much alcohol is in your drink. In the [place] one drink equals one 12 oz bottle of beer (355 mL), one 5 oz glass of wine (148 mL), or one 1 oz glass of hard liquor (44 mL). Where to find more information Centers for Disease Control and Prevention: FootballExhibition.com.br American Sexual Health Association (ASHA): www.ashastd.org U.S. Department of Health and Health and safety inspector, Office on Women's Health: http://hoffman.com/ Contact a health care provider if: Your symptoms do not improve, even after treatment. You have more discharge or pain when urinating. You have a fever or chills. You have pain in your abdomen or pelvis. You have pain during sex. You have vaginal bleeding between menstrual periods. Summary Bacterial vaginosis is a vaginal infection that occurs when the normal balance of bacteria in the vagina changes. It results from an overgrowth of certain bacteria. This condition increases the risk of sexually transmitted infections (STIs). Getting treated can help reduce this risk. Treatment is very important for pregnant women because this condition can cause babies to be born early (prematurely) or at low birth weight. This condition is treated with antibiotic medicines. These may be given as a pill, a vaginal cream, or a medicine that is  put into the vagina (suppository). This information is not intended to replace advice given to you by your health care provider. Make sure you discuss any questions you have with your health care provider. Document Revised: 04/09/2020 Document Reviewed: 04/09/2020 Elsevier Patient Education  2024 [employer].

## 2023-07-26 ENCOUNTER — Encounter: Payer: Self-pay | Admitting: Physician Assistant

## 2023-07-26 LAB — CERVICOVAGINAL ANCILLARY ONLY
Bacterial Vaginitis (gardnerella): POSITIVE — AB
Candida Glabrata: NEGATIVE
Candida Vaginitis: NEGATIVE
Chlamydia: NEGATIVE
Comment: NEGATIVE
Comment: NEGATIVE
Comment: NEGATIVE
Comment: NEGATIVE
Comment: NEGATIVE
Comment: NORMAL
Neisseria Gonorrhea: NEGATIVE
Trichomonas: NEGATIVE

## 2023-07-27 LAB — COMP. METABOLIC PANEL (12)
AST: 19 [IU]/L (ref 0–40)
Albumin: 4.3 g/dL (ref 3.9–4.9)
Alkaline Phosphatase: 66 [IU]/L (ref 44–121)
BUN/Creatinine Ratio: 7 — ABNORMAL LOW (ref 9–23)
BUN: 7 mg/dL (ref 6–24)
Bilirubin Total: 0.2 mg/dL (ref 0.0–1.2)
Calcium: 8.7 mg/dL (ref 8.7–10.2)
Chloride: 98 mmol/L (ref 96–106)
Creatinine, Ser: 0.94 mg/dL (ref 0.57–1.00)
Globulin, Total: 2.7 g/dL (ref 1.5–4.5)
Glucose: 76 mg/dL (ref 70–99)
Potassium: 3.3 mmol/L — ABNORMAL LOW (ref 3.5–5.2)
Sodium: 136 mmol/L (ref 134–144)
Total Protein: 7 g/dL (ref 6.0–8.5)
eGFR: 78 mL/min/{1.73_m2} (ref >59)

## 2023-07-27 LAB — RPR, QUANT+TP ABS (REFLEX)
Rapid Plasma Reagin, Quant: 1:2 {titer} — ABNORMAL HIGH
T Pallidum Abs: REACTIVE — AB

## 2023-07-27 LAB — RPR: RPR Ser Ql: REACTIVE — AB

## 2023-07-27 LAB — HIV ANTIBODY (ROUTINE TESTING W REFLEX): HIV Screen 4th Generation wRfx: NONREACTIVE

## 2023-07-27 MED ORDER — PENICILLIN G BENZATHINE 2400000 UNIT/4ML IM SUSY
2.4000 10*6.[IU] | PREFILLED_SYRINGE | INTRAMUSCULAR | Status: AC
Start: 2023-07-31 — End: 2023-08-20

## 2023-07-27 NOTE — Addendum Note (Signed)
Addended by: Roney Jaffe on: 07/27/2023 09:34 AM   Modules accepted: Orders

## 2023-07-31 LAB — CYTOLOGY - PAP
Adequacy: ABSENT
Diagnosis: NEGATIVE

## 2023-08-08 ENCOUNTER — Ambulatory Visit (HOSPITAL_COMMUNITY): Payer: MEDICAID

## 2023-08-10 ENCOUNTER — Encounter (HOSPITAL_COMMUNITY): Payer: Self-pay

## 2023-08-10 ENCOUNTER — Ambulatory Visit (HOSPITAL_COMMUNITY): Payer: Medicaid Other

## 2023-08-10 ENCOUNTER — Ambulatory Visit (INDEPENDENT_AMBULATORY_CARE_PROVIDER_SITE_OTHER): Payer: MEDICAID | Admitting: Student

## 2023-08-10 VITALS — BP 119/75 | HR 85 | Temp 98.2°F | Ht 71.0 in | Wt 148.0 lb

## 2023-08-10 DIAGNOSIS — F411 Generalized anxiety disorder: Secondary | ICD-10-CM | POA: Diagnosis not present

## 2023-08-10 DIAGNOSIS — F3162 Bipolar disorder, current episode mixed, moderate: Secondary | ICD-10-CM

## 2023-08-10 MED ORDER — ARIPIPRAZOLE 2 MG PO TABS
2.0000 mg | ORAL_TABLET | Freq: Every day | ORAL | 3 refills | Status: DC
Start: 2023-08-10 — End: 2023-12-07

## 2023-08-10 MED ORDER — ARIPIPRAZOLE ER 960 MG/3.2ML IM PRSY
960.0000 mg | PREFILLED_SYRINGE | INTRAMUSCULAR | Status: AC
Start: 2023-08-10 — End: 2023-10-12
  Administered 2023-08-10 – 2023-10-12 (×2): 960 mg via INTRAMUSCULAR

## 2023-08-10 MED ORDER — MIRTAZAPINE 15 MG PO TABS
15.0000 mg | ORAL_TABLET | Freq: Every day | ORAL | 3 refills | Status: DC
Start: 2023-08-10 — End: 2023-12-07

## 2023-08-10 NOTE — Progress Notes (Signed)
Patient in today for due Abilify Asimtufii 960 mg/3.2 ml IM every 2 month injection.  Patient presented with appropriate affect, pleasant mood and denied any current symptoms, no suicidal or homicidal ideations, plan, intent, or means to want to harm self or others and no auditory or visual hallucinations.  Patient stated she is currently staying in the [employer] and lost her injection kit when she gave it to her sister to keep but she moved out of her house and threw the injection packet away.  Discussed how expensive these are and patient agreed to start bringing her's into the [facility] outpatient to lock up for her to have every 2 months.  Patient to obtain a sample this date, per approval of Dr. Hazle Quant.  Patient evaluated by Dr. Hazle Quant and after assisted with due Abilify Asimtufii 960 mg/3.2 ml IM injection in her right upper outer gluteal area.  Patient tolerated injection without any complaint of pain or discomfort and will return in 2 months for next due injection.  Patient to call if any issues or problems prior to next appointment.

## 2023-08-10 NOTE — Progress Notes (Signed)
BH MD Outpatient Progress Note  08/10/2023 2:30 PM Natalie Fischer  MRN:  [id number]  Assessment:  Natalie Fischer presents for follow-up evaluation in-person. Today, 08/10/23, patient reports to over doing well. She has been grieving over brother's recent death but has otherwise been stable with regards to mood and anxiety. She remains abstinent from all substance use for 8 months now. Plan to continue medications as prescribed. She will be seeing Toy Cookey in 2 months for medication management   Identifying Information: Natalie Fischer is a 42 y.o. 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   Plan:  # Bipolar disorder, currently depressed #Generalized anxiety disorder Past medication trials:  Status of problem: Active Interventions: -- Continue Abilify ER Asimtufii 960 mg every 2 months  --needs ECG, lipid panel, a1c -- Continue Abilify 2 mg daily --Continue hydralazine 25 mg 3 times daily as needed --Continue mirtazapine 15 mg nightly  # Opiate use disorder in early remission, on Suboxone #Stimulant use disorder, in early remission #Sedative use, early remission Past medication trials:  Status of problem: Remission Interventions: -- Advised continued cessation -- Defer Suboxone for PCP  Return to care in 2 months  Patient was given contact information for behavioral health clinic and was instructed to call 911 for emergencies.    Patient and plan of care will be discussed with the Attending MD, Dr. Josephina Shih, who agrees with the above statement and plan.   Subjective:  Chief Complaint: Medication Management   Interval History:  Patient seen as a follow-up for outpatient LAI clinic.  She denies SI/HI/AVH today but did admit to passive suicidal ideation  a few weeks ago.  She was able to verbally contract for safety and is aware she can go to the behavioral health urgent care should she require it.  She reports her sleep and appetite have been fair.  She reports feeling sporadically depressed due to passing of her brother recently.  She otherwise has no acute complaints and would like to be continued on her current medication regimen.   Visit Diagnosis:    ICD-10-CM   1. Bipolar 1 disorder, mixed, moderate (HCC)  F31.62 ARIPiprazole ER PRSY 960 mg    2. Generalized anxiety disorder  F41.1       Past Psychiatric History:  anxiety, depression, bipolar disorder, substance use (cocaine, marijuana, opioids, and benzos)   Past Medical History:  Past Medical History:  Diagnosis Date   Anxiety    Bipolar 1 disorder (HCC)    Carpal tunnel syndrome of right wrist 2016   Chlamydia    Constipation    Ectopic pregnancy 2008 and 2014   2008 required surgery, 2nd Misoprostol   GERD (gastroesophageal reflux disease)    Insomnia    Trichomonas vaginitis    UTI (lower urinary tract infection)     Past Surgical History:  Procedure Laterality Date   ECTOPIC PREGNANCY SURGERY Right 2008    Family Psychiatric History: Sister: Depression/attempted suicide in past, Paternal aunt: Bipolar-attempted suicide in past   Family History:  Family History  Problem Relation Age of Onset   Cancer Sister 61   Healthy Mother    Healthy Father    COPD Sister    Alcohol abuse Neg Hx    Arthritis Neg Hx    Asthma Neg Hx  Birth defects Neg Hx    Depression Neg Hx    Diabetes Neg Hx    Drug abuse Neg Hx    Early death Neg Hx    Hearing loss Neg Hx    Heart disease Neg Hx    Hyperlipidemia Neg Hx    Hypertension Neg Hx    Kidney disease Neg Hx    Learning disabilities Neg Hx    Mental illness Neg Hx    Mental retardation Neg Hx    Miscarriages / Stillbirths Neg Hx    Stroke Neg Hx    Vision loss Neg Hx    Varicose Veins Neg Hx     Social  History:   Social History   Socioeconomic History   Marital status: Single    Spouse name: Not on file   Number of children: 0   Years of education: college-1   Highest education level: Not on file  Occupational History   Occupation: unemployed    Comment: Previously worked [employer], Clinical biochemist.  Tobacco Use   Smoking status: Former    Current packs/day: 0.50    Average packs/day: 0.5 packs/day for 24.2 years (12.1 ttl pk-yrs)    Types: Cigarettes    Start date: 05/15/1999   Smokeless tobacco: Never   Tobacco comments:    0.5 PPD, has now started vaping instead (05/05/22)  Vaping Use   Vaping status: Every Day   Substances: Nicotine, THC, CBD  Substance and Sexual Activity   Alcohol use: Not Currently    Comment: occasionally   Drug use: Not Currently    Comment: Last used June 2019. Marijuana: On Saturday   Sexual activity: Not Currently    Partners: Male    Birth control/protection: Condom    Comment: 1 partner  Other Topics Concern   Not on file  Social History Narrative   Born in [place], [place] to [place], in 1998 when her mother got a job in one of the [place] here.   Now living with her father.  Sometimes with her sister.   Parents separated.   Social Determinants of Health   Financial Resource Strain: High Risk (07/20/2021)   Overall Financial Resource Strain (CARDIA)    Difficulty of Paying Living Expenses: Very hard  Food Insecurity: Food Insecurity Present (07/20/2021)   Hunger Vital Sign    Worried About Running Out of Food in the Last Year: Often true    Ran Out of Food in the Last Year: Often true  Transportation Needs: Unmet Transportation Needs (07/20/2021)   PRAPARE - Administrator, Civil Service (Medical): Yes    Lack of Transportation (Non-Medical): Yes  Physical Activity: Inactive (07/20/2021)   Exercise Vital Sign    Days of Exercise per Week: 0 days    Minutes of Exercise per Session: 0 min  Stress:  Stress Concern Present (07/20/2021)   [employer] of Occupational Health - Occupational Stress Questionnaire    Feeling of Stress : Very much  Social Connections: Socially Isolated (07/20/2021)   Social Connection and Isolation Panel [NHANES]    Frequency of Communication with Friends and Family: Once a week    Frequency of Social Gatherings with Friends and Family: Never    Attends Religious Services: 1 to 4 times per year    Active Member of [employer] or Organizations: No    Attends Banker Meetings: Never    Marital Status: Never married  Allergies:  Allergies  Allergen Reactions   Depakene [Divalproex Sodium] Other (See Comments)    "made me feel like I was going to pass out"    Current Medications: Current Outpatient Medications  Medication Sig Dispense Refill   ARIPiprazole (ABILIFY) 2 MG tablet Take 1 tablet (2 mg total) by mouth daily. 30 tablet 3   ARIPiprazole ER (ABILIFY ASIMTUFII) 960 MG/3.2ML PRSY Inject 960 mg into the muscle every 2 months. 3.2 mL 11   buprenorphine-naloxone (SUBOXONE) 8-2 mg SUBL SL tablet Place 1 tablet under the tongue 2 (two) times daily. 28 tablet 1   DENTA 5000 PLUS 1.1 % CREA dental cream Take 1 Application by mouth daily.     hydrOXYzine (ATARAX) 25 MG tablet Take 1 tablet (25 mg total) by mouth 3 (three) times daily as needed for anxiety or itching. 90 tablet 3   ibuprofen (ADVIL,MOTRIN) 200 MG tablet Take 800 mg by mouth every 6 (six) hours as needed for headache or moderate pain (pain score 4-6).     mirtazapine (REMERON) 15 MG tablet Take 1 tablet (15 mg total) by mouth at bedtime. 30 tablet 3   Prenatal Vit-Fe Fumarate-FA (M-NATAL PLUS) 27-1 MG TABS Take 1 tablet by mouth daily.     senna (SENOKOT) 8.6 MG TABS tablet Take 1 tablet by mouth daily. (Patient not taking: Reported on 08/10/2023) 30 tablet 1   Current Facility-Administered Medications  Medication Dose Route Frequency Provider Last Rate Last Admin   ARIPiprazole  ER PRSY 960 mg  960 mg Intramuscular Q2 months Park Pope, MD   960 mg at 08/10/23 1355   Penicillin G Benzathine SUSY 2,400,000 Units  2.4 Million Units Intramuscular Weekly         ROS: Review of Systems   Objective:  Psychiatric Specialty Exam: Blood pressure 119/75, pulse 85, temperature 98.2 F (36.8 C), height 5\' 11"  (1.803 m), weight 148 lb (67.1 kg), last menstrual period 07/13/2023.Body mass index is 20.64 kg/m.  General Appearance: Casual  Eye Contact: Fair  Speech:  Clear and Coherent  Volume:  Normal  Mood:  Euthymic  Affect:  Depressed  Thought Content: Logical   Suicidal Thoughts:  No  Homicidal Thoughts:  No  Thought Process:  Coherent, Goal Directed, and Linear  Orientation:  Full (Time, Place, and Person)    Memory: Remote;   Fair  Judgment:  Intact  Insight:  Fair  Concentration:  Concentration: Fair  Recall: not formally assessed   Fund of Knowledge: Fair  Language: Good  Psychomotor Activity:  Normal  Akathisia:  Negative  AIMS (if indicated): not done  Assets:  Communication Skills Desire for Improvement Financial Resources/Insurance Housing Leisure Time Physical Health Resilience Social Support Talents/Skills Transportation  ADL's:  Intact  Cognition: WNL  Sleep:  Fair   PE: General: well-appearing; no acute distress  Pulm: no increased work of breathing on room air  Strength & Muscle Tone: within normal limits Neuro: no focal neurological deficits observed  Gait & Station: normal  Metabolic Disorder Labs: Lab Results  Component Value Date   HGBA1C 6.0 (H) 02/21/2015   MPG 126 02/21/2015   No results found for: "PROLACTIN" Lab Results  Component Value Date   CHOL 138 02/21/2015   TRIG 60 02/21/2015   HDL 51 02/21/2015   CHOLHDL 2.7 02/21/2015   VLDL 12 02/21/2015   LDLCALC 75 02/21/2015   LDLCALC 86 04/03/2009   Lab Results  Component Value Date   TSH 0.299 (L) 02/20/2015   TSH  0.519 05/10/2010    Therapeutic Level  Labs: No results found for: "LITHIUM" No results found for: "VALPROATE" Lab Results  Component Value Date   CBMZ 7.1 02/21/2015    Screenings: AIMS    Flowsheet Row Office Visit from 06/13/2023 in [facility]) [facility]) [facility]) [facility]  AIMS Total Score 0 0 0 0      AUDIT    Flowsheet Row Counselor from 07/20/2021 in [facility] Ad[facility] (Discharged[facility] (Dis[facility]) [facility]) [facility]  Alcohol Use Disorder Identification Test Final Score (AUDIT) 7 3 18 1       GAD-7    Flowsheet Row Office Visit from 07/25/2023 in [facility] Most recent reading at 07/25/2023  4:58 PM Office Visit from 06/13/2023 in [facility] Most recent reading at 06/13/2023 10:35 AM Clinical Support from 06/13/2023 in [facility] Most recent reading at 06/13/2023 10:23 AM Office Visit from 02/07/2023 in [place] Most recent reading at 02/07/2023  1:36 PM Office Visit from 08/03/2022 in [facility] Most recent reading at 08/03/2022  3:48 PM  Total GAD-7 Score 20 16 16 21 20       PHQ2-9    Flowsheet Row Office Visit from 07/25/2023 in [facility] Most recent reading at 07/25/2023  4:57 PM Office Visit from 06/13/2023 in [facility] Most recent reading at 06/13/2023 10:35 AM Clinical Support from 06/13/2023 in [facility] Most recent reading at  06/13/2023 10:22 AM Office Visit from 02/07/2023 in [facility] Most recent reading at 02/07/2023  1:35 PM Office Visit from 08/03/2022 in [facility] Most recent reading at 08/03/2022  3:46 PM  PHQ-2 Total Score 4 4 4 6 4   PHQ-9 Total Score 16 12 12 27 21       Flowsheet Row Office Visit from 02/07/2023 in [facility] Office Visit from 05/05/2022 in [facility] ED from 09/23/2021 in [facility] Emergency Department at [facility]  C-SSRS RISK CATEGORY Error: Q7 should not be populated when Q6 is No No Risk No Risk       Collaboration of Care: Collaboration of Care:  Patient/Guardian was advised Release of Information must be obtained prior to any record release in order to collaborate their care with an outside provider. Patient/Guardian was advised if they have not already done so to contact the registration department to sign all necessary forms in order for [place] to release information regarding their care.   Consent: Patient/Guardian gives verbal consent for treatment and assignment of benefits for services provided during this visit. Patient/Guardian expressed understanding and agreed to proceed.   Park Pope, MD 08/10/2023, 2:30 PM

## 2023-08-10 NOTE — Progress Notes (Deleted)
Patient in today for due Abilify Asimtufii 960 mg/3.2 ml IM every 2 month injection.  Patient presented with appropriate affect, pleasant mood and denied any current symptoms, no suicidal or homicidal ideations, plan, intent, or means to want to harm self or others and no auditory or visual hallucinations.  Patient stated she is currently staying in the [employer] and lost her injection kit when she gave it to her sister to keep but she moved out of her house and threw the injection packet away.  Discussed how expensive these are and patient agreed to start bringing her's into the [facility] outpatient to lock up for her to have every 2 months.  Patient to obtain a sample this date, per approval of Dr. Hazle Quant.  Patient evaluated by Dr. Hazle Quant and after assisted with due Abilify Asimtufii 960 mg/3.2 ml IM injection in her right upper outer gluteal area.  Patient tolerated injection without any complaint of pain or discomfort and will return in 2 months for next due injection.  Patient to call if any issues or problems prior to next appointment.

## 2023-08-24 ENCOUNTER — Ambulatory Visit: Payer: MEDICAID

## 2023-09-28 ENCOUNTER — Other Ambulatory Visit (HOSPITAL_COMMUNITY): Payer: Self-pay | Admitting: Psychiatry

## 2023-09-28 ENCOUNTER — Encounter (HOSPITAL_COMMUNITY): Payer: Medicaid Other | Admitting: Student

## 2023-09-28 ENCOUNTER — Encounter (HOSPITAL_COMMUNITY): Payer: Self-pay

## 2023-09-28 DIAGNOSIS — F3162 Bipolar disorder, current episode mixed, moderate: Secondary | ICD-10-CM

## 2023-10-05 ENCOUNTER — Ambulatory Visit (HOSPITAL_COMMUNITY): Payer: Medicaid Other

## 2023-10-12 ENCOUNTER — Encounter (HOSPITAL_COMMUNITY): Payer: Self-pay

## 2023-10-12 ENCOUNTER — Telehealth (HOSPITAL_COMMUNITY): Payer: Self-pay

## 2023-10-12 ENCOUNTER — Telehealth (HOSPITAL_COMMUNITY): Payer: Self-pay | Admitting: *Deleted

## 2023-10-12 ENCOUNTER — Ambulatory Visit (HOSPITAL_COMMUNITY): Payer: MEDICAID

## 2023-10-12 VITALS — BP 117/84 | HR 85 | Ht 71.0 in | Wt 130.0 lb

## 2023-10-12 DIAGNOSIS — F3162 Bipolar disorder, current episode mixed, moderate: Secondary | ICD-10-CM

## 2023-10-12 NOTE — Telephone Encounter (Signed)
Pt called wanting to reschedule her injection from last week. She missed her appt due to thinking her injection was scheduled for 10/12/23. Please contact pt for rescheduling.

## 2023-10-12 NOTE — Progress Notes (Cosign Needed)
This patient arrived to receive her ASIMTUFII-960-MG injection in the left glute. Patient vitals were taken and patient tolerated the injection with no complaints. No SI, AVH, or HI. Patient stated she does feel slightly depressed, acknowledged she was a week late for her shot, in which she did begin to display symptoms.  Patient also lost over 10 pounds since very last visit, which she also noticed herself.     [id number] LOT: [id number] EXP: MAY 2025

## 2023-10-23 ENCOUNTER — Encounter (HOSPITAL_COMMUNITY): Payer: Self-pay | Admitting: Emergency Medicine

## 2023-10-23 ENCOUNTER — Ambulatory Visit (HOSPITAL_COMMUNITY)
Admission: EM | Admit: 2023-10-23 | Discharge: 2023-10-23 | Disposition: A | Discharge status: Home / Self Care | Payer: MEDICAID | Attending: Family Medicine | Admitting: Family Medicine

## 2023-10-23 DIAGNOSIS — N3000 Acute cystitis without hematuria: Secondary | ICD-10-CM | POA: Insufficient documentation

## 2023-10-23 DIAGNOSIS — Z113 Encounter for screening for infections with a predominantly sexual mode of transmission: Secondary | ICD-10-CM | POA: Diagnosis not present

## 2023-10-23 LAB — POCT URINALYSIS DIP (MANUAL ENTRY)
Bilirubin, UA: NEGATIVE
Glucose, UA: NEGATIVE mg/dL
Ketones, POC UA: NEGATIVE mg/dL
Nitrite, UA: NEGATIVE
Protein Ur, POC: 30 mg/dL — AB
Spec Grav, UA: 1.02 (ref 1.010–1.025)
Urobilinogen, UA: 1 U/dL
pH, UA: 7 (ref 5.0–8.0)

## 2023-10-23 LAB — HIV ANTIBODY (ROUTINE TESTING W REFLEX): HIV Screen 4th Generation wRfx: NONREACTIVE

## 2023-10-23 LAB — POCT URINE PREGNANCY: Preg Test, Ur: NEGATIVE

## 2023-10-23 MED ORDER — NITROFURANTOIN MONOHYD MACRO 100 MG PO CAPS: 100.0000 mg | ORAL_CAPSULE | Freq: Two times a day (BID) | ORAL | 0 refills | Status: AC

## 2023-10-23 NOTE — ED Triage Notes (Signed)
Patient c/o dysuria and pain after urination, some abdominal pain x 3 days.  Patient began having body chills today.  Patient would like STI testing including bloodwork.

## 2023-10-23 NOTE — Discharge Instructions (Signed)
Other lab results will be available within 24 hours.

## 2023-10-23 NOTE — ED Provider Notes (Signed)
[facility]    CSN: [id number] Arrival date & time: 10/23/23  1729      History   Chief Complaint Chief Complaint  Patient presents with   Dysuria    HPI Natalie Fischer is a 42 y.o. female.   HPI Patient presents today with concern of a possible UTI.  Patient reports dysuria and discomfort following urination along with lower abdominal pain intermittently for 3 days.  Endorses having some mild chills today.  She is also requesting STD testing including HIV and RPR testing.  She has had no known exposure.  Denies nausea, vomiting she is currently her menstrual period. 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 Authorizing Provider  ARIPiprazole (ABILIFY) 2 MG tablet Take 1 tablet (2 mg total) by mouth daily. 08/10/23  Yes Park Pope, MD  ARIPiprazole ER (ABILIFY ASIMTUFII) 960 MG/3.2ML PRSY Inject 960 mg into the muscle every 2 months. 06/13/23  Yes  Toy Cookey E, NP  buprenorphine-naloxone (SUBOXONE) 8-2 mg SUBL SL tablet Place 1 tablet under the tongue 2 (two) times daily. 11/09/21  Yes Tyson Alias, MD  DENTA 5000 PLUS 1.1 % CREA dental cream Take 1 Application by mouth daily. 07/07/23  Yes [provider]  hydrOXYzine (ATARAX) 25 MG tablet Take 1 tablet (25 mg total) by mouth 3 (three) times daily as needed for anxiety or itching. 06/13/23  Yes Toy Cookey E, NP  ibuprofen (ADVIL,MOTRIN) 200 MG tablet Take 800 mg by mouth every 6 (six) hours as needed for headache or moderate pain (pain score 4-6).   Yes [provider]  mirtazapine (REMERON) 15 MG tablet Take 1 tablet (15 mg total) by mouth at bedtime. 08/10/23  Yes Park Pope, MD  nitrofurantoin, macrocrystal-monohydrate, (MACROBID) 100 MG capsule Take 1 capsule (100 mg total) by mouth 2 (two) times daily. 10/23/23  Yes Bing Neighbors, NP  Prenatal Vit-Fe Fumarate-FA (M-NATAL PLUS) 27-1 MG TABS Take 1 tablet by mouth daily. 07/07/23  Yes [provider]  senna (SENOKOT) 8.6 MG TABS tablet Take 1 tablet by mouth daily. Patient  not taking: Reported on 08/10/2023 11/09/21   Tyson Alias, MD  pantoprazole (PROTONIX) 20 MG tablet Take 1 tablet (20 mg total) by mouth daily. For acid reflux Patient not taking: Reported on 01/02/2019 08/10/17 09/06/19  Sanjuana Kava, NP    Family History Family History  Problem Relation Age of Onset   Cancer Sister 37   Healthy Mother    Healthy Father    COPD Sister    Alcohol abuse Neg Hx    Arthritis Neg Hx    Asthma Neg Hx    Birth defects Neg Hx    Depression Neg Hx    Diabetes Neg Hx    Drug abuse Neg Hx    Early death Neg Hx    Hearing loss Neg Hx    Heart disease Neg Hx    Hyperlipidemia Neg Hx    Hypertension Neg Hx    Kidney disease Neg Hx    Learning disabilities Neg Hx    Mental illness Neg Hx    Mental retardation Neg Hx    Miscarriages / Stillbirths Neg Hx    Stroke Neg  Hx    Vision loss Neg Hx    Varicose Veins Neg Hx     Social History Social History   Tobacco Use   Smoking status: Former    Current packs/day: 0.50    Average packs/day: 0.5 packs/day for 24.4 years (12.2 ttl pk-yrs)    Types: Cigarettes    Start date: 05/15/1999   Smokeless tobacco: Never   Tobacco comments:    0.5 PPD, has now started vaping instead (05/05/22)  Vaping Use   Vaping status: Every Day   Substances: Nicotine, THC, CBD  Substance Use Topics   Alcohol use: Not Currently    Comment: occasionally   Drug use: Not Currently    Comment: Last used June 2019. Marijuana: On Saturday     Allergies   Depakene [divalproex sodium]   Review of Systems Review of Systems Pertinent negatives listed in HPI   Physical Exam Triage Vital Signs ED Triage Vitals  Encounter Vitals Group     BP      Systolic BP Percentile      Diastolic BP Percentile      Pulse      Resp      Temp      Temp src      SpO2      Weight      Height      Head Circumference      Peak Flow      Pain Score      Pain Loc      Pain Education      Exclude from Growth Chart    No data found.  Updated Vital Signs BP 107/68 (BP Location: Left Arm)   Pulse 84   Temp 97.9 F (36.6 C) (Oral)   Resp 18   Ht 5\' 11"  (1.803 m)   Wt 146 lb (66.2 kg)   LMP 10/21/2023   SpO2 95%   BMI 20.36 kg/m   Visual Acuity Right Eye Distance:   Left Eye Distance:   Bilateral Distance:    Right Eye Near:   Left Eye Near:    Bilateral Near:     Physical Exam General appearance: alert, well developed, well nourished, cooperative and in no distress Head: Normocephalic, without obvious abnormality, atraumatic Respiratory: Respirations even and unlabored, normal respiratory rate Heart: rate and rhythm normal.   CVA:  no flank pain Extremities: No gross deformities Skin: Skin color, texture, turgor normal. No rashes seen  Psych: Appropriate mood and affect.   UC Treatments / Results  Labs (all  labs ordered are listed, but only abnormal results are displayed) Labs Reviewed  POCT URINALYSIS DIP (MANUAL ENTRY) - Abnormal; Notable for the following components:      Result Value   Clarity, UA hazy (*)    Blood, UA moderate (*)    Protein Ur, POC =30 (*)    Leukocytes, UA Trace (*)    All other components within normal limits  URINE CULTURE  HIV ANTIBODY (ROUTINE TESTING W REFLEX)  RPR  POCT URINE PREGNANCY  CERVICOVAGINAL ANCILLARY ONLY    EKG   Radiology No results found.  Procedures Procedures (including critical care time)  Medications Ordered in UC Medications - No data to display  Initial Impression / Assessment and Plan / UC Course  I have reviewed the triage vital signs and the nursing notes.  Pertinent labs & imaging results that were available during my care of the patient were reviewed by me and considered in my medical decision making (see chart for details).      UA abnormal and findings consistent with UTI. Empiric antibiotic treatment initiated.  Encouraged increase intake of water.  Urine culture pending.  Vaginal cytology pending.  HIV/RPR results pending patient advised that results will result directly to MyChart and our office will contact her directly if any additional treatment is warranted based on results. ER if symptoms become severe. Follow-up with PCP if symptoms do not completely resolve.  Final Clinical Impressions(s) / UC Diagnoses   Final diagnoses:  Acute cystitis without hematuria  Screen for STD (sexually transmitted disease)     Discharge Instructions      Other lab results will be available within 24 hours.     ED Prescriptions     Medication Sig Dispense Auth. Provider   nitrofurantoin, macrocrystal-monohydrate, (MACROBID) 100 MG capsule Take 1 capsule (100 mg total) by mouth 2 (two) times daily. 10 capsule Bing Neighbors, NP      PDMP not reviewed this encounter.   Bing Neighbors, NP 10/23/23 [phone]

## 2023-10-24 LAB — RPR
RPR Ser Ql: REACTIVE — AB
RPR Titer: 1:2 {titer}

## 2023-10-26 LAB — CERVICOVAGINAL ANCILLARY ONLY
Bacterial Vaginitis (gardnerella): POSITIVE — AB
Candida Glabrata: NEGATIVE
Candida Vaginitis: POSITIVE — AB
Chlamydia: NEGATIVE
Comment: NEGATIVE
Comment: NEGATIVE
Comment: NEGATIVE
Comment: NEGATIVE
Comment: NEGATIVE
Comment: NORMAL
Neisseria Gonorrhea: NEGATIVE
Trichomonas: NEGATIVE

## 2023-10-26 LAB — URINE CULTURE
Culture: 80000 — AB
Special Requests: NORMAL

## 2023-10-26 LAB — T.PALLIDUM AB, TOTAL: T Pallidum Abs: REACTIVE — AB

## 2023-10-27 ENCOUNTER — Telehealth (HOSPITAL_COMMUNITY): Payer: Self-pay | Admitting: Emergency Medicine

## 2023-10-27 MED ORDER — FLUCONAZOLE 150 MG PO TABS
150.0000 mg | ORAL_TABLET | Freq: Once | ORAL | 0 refills | Status: AC
Start: 2023-10-27 — End: 2023-10-27

## 2023-10-27 MED ORDER — METRONIDAZOLE 500 MG PO TABS: 500.0000 mg | ORAL_TABLET | Freq: Two times a day (BID) | ORAL | 0 refills | Status: AC

## 2023-10-27 NOTE — Telephone Encounter (Signed)
 Diflucan for yeast Metronidazole for positive BV Per protocol

## 2023-12-07 ENCOUNTER — Ambulatory Visit (INDEPENDENT_AMBULATORY_CARE_PROVIDER_SITE_OTHER): Payer: MEDICAID | Admitting: Student

## 2023-12-07 ENCOUNTER — Other Ambulatory Visit: Payer: Self-pay

## 2023-12-07 ENCOUNTER — Ambulatory Visit (HOSPITAL_COMMUNITY): Payer: MEDICAID

## 2023-12-07 VITALS — BP 116/80 | HR 90 | Ht 71.0 in | Wt 152.0 lb

## 2023-12-07 DIAGNOSIS — Z79899 Other long term (current) drug therapy: Secondary | ICD-10-CM | POA: Diagnosis not present

## 2023-12-07 DIAGNOSIS — F411 Generalized anxiety disorder: Secondary | ICD-10-CM

## 2023-12-07 DIAGNOSIS — F3162 Bipolar disorder, current episode mixed, moderate: Secondary | ICD-10-CM

## 2023-12-07 DIAGNOSIS — F3132 Bipolar disorder, current episode depressed, moderate: Secondary | ICD-10-CM

## 2023-12-07 MED ORDER — HYDROXYZINE HCL 25 MG PO TABS
25.0000 mg | ORAL_TABLET | Freq: Three times a day (TID) | ORAL | 3 refills | Status: DC | PRN
  Filled 2023-12-07: qty 90, 30d supply, fill #0

## 2023-12-07 MED ORDER — ARIPIPRAZOLE ER 960 MG/3.2ML IM PRSY
960.0000 mg | PREFILLED_SYRINGE | Freq: Once | INTRAMUSCULAR | Status: AC
  Administered 2023-12-07: 960 mg via INTRAMUSCULAR

## 2023-12-07 MED ORDER — MIRTAZAPINE 15 MG PO TABS
15.0000 mg | ORAL_TABLET | Freq: Every day | ORAL | 3 refills | Status: DC
  Filled 2023-12-07: qty 30, 30d supply, fill #0

## 2023-12-07 MED ORDER — ARIPIPRAZOLE 2 MG PO TABS
2.0000 mg | ORAL_TABLET | Freq: Every day | ORAL | 3 refills | Status: DC
Start: 2023-12-07 — End: 2023-12-08
  Filled 2023-12-07: qty 30, 30d supply, fill #0

## 2023-12-07 NOTE — Progress Notes (Addendum)
 Pt presents today for injection of Abilify Asimtufii. Pt has no concerns regarding injection today and was tolerated successfully in Right Upper Outer Quadrant . Patient is lovely as always with  a very sweet personality. Pt states that she is not experiencing any SI, and HI. However, she is seeing bugs that arey not there. This was notified to the MD. Patient also states towards the end  of month when her medication runs out ( before her  next scheduled injection app) that she sometimes feels depressed more than usual.

## 2023-12-07 NOTE — Progress Notes (Signed)
BH MD Outpatient Progress Note  12/07/2023 3:26 PM Natalie Fischer  MRN:  [id number]  Assessment:  Natalie Fischer presents to [facility] clinic today for her Asimtufii injection.  She was scheduled to see a regular psychiatric provider in December but did not show up for this appointment.  She reports that she has been doing fairly well.  She does report experiencing "manic" symptoms.  The manic symptoms that she describes appear potentially consistent with euthymic life experience in a patient who has/is experiencing significant depression.  She is happy with her medications as they are.  No changes planned for today.  Because she has had numerous no-shows, I was told by the front desk that she will need to come back as a walk-in in order to reestablish care at the clinic.  This was discussed with the patient.  I will note that she is scheduled for a future [facility] appointment.  Would recommend that the next regular psychiatric provider reconsider diagnosis of bipolar affective disorder, since it appears this diagnosis may have been confounded by significant substance use.  Identifying Information: Natalie Fischer is a 43 y.o. 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   Plan:  # Bipolar disorder, currently depressed #Generalized anxiety disorder Status of problem: Active Interventions: -- Continue Abilify ER Asimtufii 960 mg every 2 months -- Needs ECG, lipid panel, A1c (unable to schedule labs at this time, patient given information for reestablishing with a primary care physician) -- Continue Abilify 2 mg daily --Continue hydroxyzine 25 mg 3 times daily as needed --Continue mirtazapine 15 mg nightly  # Opiate use disorder in early remission #Stimulant use disorder, in early  remission #Sedative use, early remission Past medication trials:  Status of problem: Remission Interventions: -- Advised continued cessation -- Patient can restart Suboxone with primary care physician help  Patient was given contact information for behavioral health clinic and was instructed to call 911 for emergencies.    Subjective:  Chief Complaint: Medication Management   Interval History:  Patient seen as a follow-up for outpatient [facility]I clinic.  In addition to the information described above, patient reports having support from her fianc of 7 years, as well as her mother and her sister.  She reports experiencing some depression over the past month but that this has not been too severe.  She reports experiencing passive suicidal thoughts on 1 occasion over the past several weeks.  She had thoughts of not wanting to be alive for a few hours.  She took no action on this thought.  She has no plans or intentions of harming herself.  The patient reports that her manic symptoms have consisted of poor focus and racing thoughts with some mood elevation.  No major impulsive behavior, grandiosity, or rapid speech.  The patient denies any illegal substance use.  She reports having a few drinks of alcohol on 1 occasion over the past few months.  The patient reports a few instances of "seeing bugs that were not really there".  She denies associated auditory hallucinations or paranoia.   Visit Diagnosis:    ICD-10-CM   1. Bipolar 1 disorder, depressed, moderate (HCC)  F31.32     2. Long term current use of antipsychotic medication  Z79.899 Lipid Profile    HgB A1c      Past  Psychiatric History:  anxiety, depression, bipolar disorder, substance use (cocaine, marijuana, opioids, and benzos)   Past Medical History:  Past Medical History:  Diagnosis Date   Anxiety    Bipolar 1 disorder (HCC)    Carpal tunnel syndrome of right wrist 2016   Chlamydia    Constipation    Ectopic pregnancy 2008  and 2014   2008 required surgery, 2nd Misoprostol   GERD (gastroesophageal reflux disease)    Insomnia    Trichomonas vaginitis    UTI (lower urinary tract infection)     Past Surgical History:  Procedure Laterality Date   ECTOPIC PREGNANCY SURGERY Right 2008    Family Psychiatric History: Sister: Depression/attempted suicide in past, Paternal aunt: Bipolar-attempted suicide in past   Family History:  Family History  Problem Relation Age of Onset   Cancer Sister 23   Healthy Mother    Healthy Father    COPD Sister    Alcohol abuse Neg Hx    Arthritis Neg Hx    Asthma Neg Hx    Birth defects Neg Hx    Depression Neg Hx    Diabetes Neg Hx    Drug abuse Neg Hx    Early death Neg Hx    Hearing loss Neg Hx    Heart disease Neg Hx    Hyperlipidemia Neg Hx    Hypertension Neg Hx    Kidney disease Neg Hx    Learning disabilities Neg Hx    Mental illness Neg Hx    Mental retardation Neg Hx    Miscarriages / Stillbirths Neg Hx    Stroke Neg Hx    Vision loss Neg Hx    Varicose Veins Neg Hx     Social History:   Social History   Socioeconomic History   Marital status: Single    Spouse name: Not on file   Number of children: 0   Years of education: college-1   Highest education level: Not on file  Occupational History   Occupation: unemployed    Comment: Previously worked [employer], Clinical biochemist.  Tobacco Use   Smoking status: Former    Current packs/day: 0.50    Average packs/day: 0.5 packs/day for 24.6 years (12.3 ttl pk-yrs)    Types: Cigarettes    Start date: 05/15/1999   Smokeless tobacco: Never   Tobacco comments:    0.5 PPD, has now started vaping instead (05/05/22)  Vaping Use   Vaping status: Every Day   Substances: Nicotine, THC, CBD  Substance and Sexual Activity   Alcohol use: Not Currently    Comment: occasionally   Drug use: Not Currently    Comment: Last used June 2019. Marijuana: On Saturday   Sexual activity: Not Currently    Partners:  Male    Birth control/protection: Condom    Comment: 1 partner  Other Topics Concern   Not on file  Social History Narrative   Born in [place], [place] in 1998 when her mother got a job in [place] of the [place].   Now living with her father.  Sometimes with her sister.   Parents separated.   Social Drivers of Health   Financial Resource Strain: High Risk (07/20/2021)   Overall Financial Resource Strain (CARDIA)    Difficulty of Paying Living Expenses: Very hard  Food Insecurity: Food Insecurity Present (07/20/2021)   Hunger Vital Sign    Worried About Running Out of Food in the Last  Year: Often true    Ran Out of Food in the Last Year: Often true  Transportation Needs: Unmet Transportation Needs (07/20/2021)   PRAPARE - Transportation    Lack of Transportation (Medical): Yes    Lack of Transportation (Non-Medical): Yes  Physical Activity: Inactive (07/20/2021)   Exercise Vital Sign    Days of Exercise per Week: 0 days    Minutes of Exercise per Session: 0 min  Stress: Stress Concern Present (07/20/2021)   [employer] of Occupational Health - Occupational Stress Questionnaire    Feeling of Stress : Very much  Social Connections: Socially Isolated (07/20/2021)   Social Connection and Isolation Panel [NHANES]    Frequency of Communication with Friends and Family: Once a week    Frequency of Social Gatherings with Friends and Family: Never    Attends Religious Services: 1 to 4 times per year    Active Member of [employer] or Organizations: No    Attends Banker Meetings: Never    Marital Status: Never married    Allergies:  Allergies  Allergen Reactions   Depakene [Divalproex Sodium] Other (See Comments)    "made me feel like I was going to pass out"    Current Medications: Current Outpatient Medications  Medication Sig Dispense Refill   ARIPiprazole (ABILIFY) 2 MG tablet Take 1 tablet (2 mg total) by mouth daily. 30 tablet 3    ARIPiprazole ER (ABILIFY ASIMTUFII) 960 MG/3.2ML PRSY Inject 960 mg into the muscle every 2 months. 3.2 mL 11   buprenorphine-naloxone (SUBOXONE) 8-2 mg SUBL SL tablet Place 1 tablet under the tongue 2 (two) times daily. 28 tablet 1   DENTA 5000 PLUS 1.1 % CREA dental cream Take 1 Application by mouth daily.     hydrOXYzine (ATARAX) 25 MG tablet Take 1 tablet (25 mg total) by mouth 3 (three) times daily as needed for anxiety or itching. 90 tablet 3   ibuprofen (ADVIL,MOTRIN) 200 MG tablet Take 800 mg by mouth every 6 (six) hours as needed for headache or moderate pain (pain score 4-6).     metroNIDAZOLE (FLAGYL) 500 MG tablet Take 1 tablet (500 mg total) by mouth 2 (two) times daily. 14 tablet 0   mirtazapine (REMERON) 15 MG tablet Take 1 tablet (15 mg total) by mouth at bedtime. 30 tablet 3   nitrofurantoin, macrocrystal-monohydrate, (MACROBID) 100 MG capsule Take 1 capsule (100 mg total) by mouth 2 (two) times daily. 10 capsule 0   Prenatal Vit-Fe Fumarate-FA (M-NATAL PLUS) 27-1 MG TABS Take 1 tablet by mouth daily.     senna (SENOKOT) 8.6 MG TABS tablet Take 1 tablet by mouth daily. (Patient not taking: Reported on 08/10/2023) 30 tablet 1   No current facility-administered medications for this visit.   Psychiatric Specialty Exam: Physical Exam Constitutional:      Appearance: the patient is not toxic-appearing.  Pulmonary:     Effort: Pulmonary effort is normal.  Neurological:     General: No focal deficit present.     Mental Status: the patient is alert and oriented to person, place, and time.   Review of Systems  Respiratory:  Negative for shortness of breath.   Cardiovascular:  Negative for chest pain.  Gastrointestinal:  Negative for abdominal pain, constipation, diarrhea, nausea and vomiting.  Neurological:  Negative for headaches.      There were no vitals taken for this visit.  General Appearance: Fairly Groomed  Eye Contact:  Good  Speech:  Clear and Coherent  Volume:  Normal  Mood:  Euthymic  Affect:  Congruent  Thought Process:  Coherent  Orientation:  Full (Time, Place, and Person)  Thought Content: Logical   Suicidal Thoughts:  No  Homicidal Thoughts:  No  Memory:  Immediate;   Good  Judgement:  fair  Insight:  fair  Psychomotor Activity:  Normal  Concentration:  Concentration: Good  Recall:  Good  Fund of Knowledge: Good  Language: Good  Akathisia:  No  Handed:  not assessed  AIMS (if indicated): not done  Assets:  Communication Skills Desire for Improvement Financial Resources/Insurance Housing Leisure Time Physical Health  ADL's:  Intact  Cognition: WNL  Sleep:  Fair     Metabolic Disorder Labs: Lab Results  Component Value Date   HGBA1C 6.0 (H) 02/21/2015   MPG 126 02/21/2015   No results found for: "PROLACTIN" Lab Results  Component Value Date   CHOL 138 02/21/2015   TRIG 60 02/21/2015   HDL 51 02/21/2015   CHOLHDL 2.7 02/21/2015   VLDL 12 02/21/2015   LDLCALC 75 02/21/2015   LDLCALC 86 04/03/2009   Lab Results  Component Value Date   TSH 0.299 (L) 02/20/2015   TSH 0.519 05/10/2010    Therapeutic Level Labs: No results found for: "LITHIUM" No results found for: "VALPROATE" Lab Results  Component Value Date   CBMZ 7.1 02/21/2015    Screenings: AIMS    Flowsheet Row Office Visit from 06/13/2023 in [facility]Dis[facility]) [facility]) [facility]  AIMS Total Score 0 0 0 0      AUDIT    Flowsheet Row Counselor from 07/20/2021 in [facility] Admission (Discharged) [facility]) [facility]  (Discharged) [facility]  Alcohol Use Disorder Identification Test Final Score (AUDIT) 7 3 18 1       GAD-7    Flowsheet Row Office Visit from 07/25/2023 in [facility] Most recent reading at 07/25/2023  4:58 PM Office Visit from 06/13/2023 in [facility] Most recent reading at 06/13/2023 10:35 AM Clinical Support from 06/13/2023 in [facility] Most recent reading at 06/13/2023 10:23 AM Office Visit from 02/07/2023 in [facility] Most recent reading at 02/07/2023  1:36 PM Office Visit from 08/03/2022 in [facility] Most recent reading at 08/03/2022  3:48 PM  Total GAD-7 Score 20 16 16 21 20       PHQ2-9    Flowsheet Row Office Visit from 07/25/2023 in [facility] Most recent reading at 07/25/2023  4:57 PM Office Visit from 06/13/2023 in [facility] Most recent reading at 06/13/2023 10:35 AM Clinical Support from 06/13/2023 in [facility] Most recent reading at 06/13/2023 10:22 AM Office Visit from 02/07/2023 in [facility] Most recent reading at 02/07/2023  1:35 PM Office Visit from 08/03/2022 in [facility] Most recent reading at 08/03/2022  3:46 PM  PHQ-2 Total Score 4 4 4 6 4   PHQ-9 Total Score 16 12 12 27 21       Flowsheet Row ED from 10/23/2023 in [facility]  [place] Office Visit from 02/07/2023 in [facility] Office Visit from 05/05/2022 in [facility]  C-SSRS RISK CATEGORY No Risk Error: Q7 should not be populated when Q6 is No No Risk       Collaboration of Care: Collaboration of Care:  Patient/Guardian was advised Release of Information must be obtained prior to any record release in order to collaborate their care  with an outside provider. Patient/Guardian was advised if they have not already done so to contact the registration department to sign all necessary forms in order for [place] to release information regarding their care.   Consent: Patient/Guardian gives verbal consent for treatment and assignment of benefits for services provided during this visit. Patient/Guardian expressed understanding and agreed to proceed.   Carlyn Reichert, MD 12/07/2023, 3:26 PM

## 2023-12-07 NOTE — Patient Instructions (Signed)
Please follow-up with the 2 practices listed below to obtain primary care services:  [facility] [address] [place] [phone]  [facility] [address] [place] [phone]

## 2023-12-08 ENCOUNTER — Ambulatory Visit (INDEPENDENT_AMBULATORY_CARE_PROVIDER_SITE_OTHER): Payer: MEDICAID | Admitting: Student

## 2023-12-08 ENCOUNTER — Other Ambulatory Visit: Payer: Self-pay

## 2023-12-08 DIAGNOSIS — F411 Generalized anxiety disorder: Secondary | ICD-10-CM | POA: Diagnosis not present

## 2023-12-08 DIAGNOSIS — G4700 Insomnia, unspecified: Secondary | ICD-10-CM | POA: Diagnosis not present

## 2023-12-08 DIAGNOSIS — F3162 Bipolar disorder, current episode mixed, moderate: Secondary | ICD-10-CM | POA: Diagnosis not present

## 2023-12-08 MED ORDER — OXCARBAZEPINE 150 MG PO TABS: 150.0000 mg | ORAL_TABLET | Freq: Two times a day (BID) | ORAL | 0 refills | Status: DC

## 2023-12-08 MED ORDER — TRAZODONE HCL 50 MG PO TABS: 25.0000 mg | ORAL_TABLET | Freq: Every day | ORAL | 0 refills | Status: DC

## 2023-12-08 MED ORDER — HYDROXYZINE HCL 25 MG PO TABS: 25.0000 mg | ORAL_TABLET | Freq: Three times a day (TID) | ORAL | 0 refills | Status: AC | PRN

## 2023-12-08 MED ORDER — ARIPIPRAZOLE 2 MG PO TABS: 2.0000 mg | ORAL_TABLET | Freq: Every day | ORAL | 0 refills | Status: DC

## 2023-12-08 MED ORDER — MIRTAZAPINE 15 MG PO TABS: 15.0000 mg | ORAL_TABLET | Freq: Every day | ORAL | 0 refills | Status: DC

## 2023-12-08 NOTE — Progress Notes (Signed)
BH MD Outpatient Progress Note  12/08/2023 8:12 AM Natalie Fischer  MRN:  [id number]  Assessment:  Natalie Doe In[place] presents to as a walk-in for medication management.  She wanted to establish care with an outpatient psychiatrist.  She has been regularly seen at the [facility] clinic for Abilify asimtufii 960.  She reports hypomania/manic symptoms prior to any substance use as well as presently experiencing it still.  She also reports inattention but this is likely secondary to not being on cocaine anymore.  It is unclear whether patient's hypomania symptoms are more signs of euthymia versus true bipolar symptoms.  Will continue evaluate patient and monitor for improvement. She continues to be sober from any substance use since 6 months ago.  Given reported insomnia, plan to start trazodone to aid with sleep.  Patient also continues to endorse racing thoughts which could be a component of bipolar disorder or anxiety so we will start Trileptal 150 mg twice daily which she previously had been on.  Will monitor for hyponatremia with next lab appointment.  Would recommend that the next regular psychiatric provider reconsider diagnosis of bipolar affective disorder, since it appears this diagnosis may have been confounded by significant substance use.   Identifying Information: Natalie Fischer is a 43 y.o. 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   Plan:  # Bipolar disorder, currently depressed #Generalized anxiety disorder Status of problem: Active Interventions: -- Continue Abilify ER Asimtufii 960 mg every 2 months -- Needs ECG, lipid panel, A1c (lab appointment) -- Continue Abilify 2 mg daily --Continue hydroxyzine 25 mg 3 times daily as needed --Continue mirtazapine 15 mg  nightly --Start trazodone 25-50 mg nightly as needed for insomnia --Start Trileptal 150 mg twice daily for mood stabilization  # Opiate use disorder in early remission #Stimulant use disorder, in early remission #Sedative use, early remission Past medication trials:  Status of problem: Remission Interventions: -- Advised continued cessation -- Patient on suboxone through Alfredo [facility] at [facility] stop --Recommend substance use counseling in order to maintain sobriety  Patient was given contact information for behavioral health clinic and was instructed to call 911 for emergencies.    Subjective:  Chief Complaint: Medication Management   Interval History:  Patient reports concern that she has not been sleeping very well for a little while long time but did not sleep at all last night.  She reports that she feels the Abilify injection does not last the full 8 weeks as she starts having "hypomanic symptoms" about 1 week prior to her next injection appointment.  She also reports significant inattention and memory problems although she does report that this seems to have started after she stopped using stimulants.  She reports hypomanic symptoms with euphoria and racing thoughts since age of 30.  She reports first using cannabis at age 45.  She reports her primary substance of choice is snorting cocaine since age 40 up until 6 months ago.  She did have 1.5 years spent using heroin but stopped all substance use approximately 6 months ago. She denies current SI/HI/AVH.  Per Dr. Aniceto Boss note yesterday: Patient seen as a follow-up for outpatient [facility] clinic.  In addition to the information described above, patient reports having support from her fianc of 7 years, as well as her mother and her sister.  She reports  experiencing some depression over the past month but that this has not been too severe.  She reports experiencing passive suicidal thoughts on 1 occasion over the past several weeks.  She had  thoughts of not wanting to be alive for a few hours.  She took no action on this thought.  She has no plans or intentions of harming herself.  The patient reports that her manic symptoms have consisted of poor focus and racing thoughts with some mood elevation.  No major impulsive behavior, grandiosity, or rapid speech.  The patient denies any illegal substance use.  She reports having a few drinks of alcohol on 1 occasion over the past few months.  The patient reports a few instances of "seeing bugs that were not really there".  She denies associated auditory hallucinations or paranoia.  Visit Diagnosis:  No diagnosis found.   Past Psychiatric History:  anxiety, depression, bipolar disorder, substance use (cocaine, marijuana, opioids, and benzos)   Past Medical History:  Past Medical History:  Diagnosis Date   Anxiety    Bipolar 1 disorder (HCC)    Carpal tunnel syndrome of right wrist 2016   Chlamydia    Constipation    Ectopic pregnancy 2008 and 2014   2008 required surgery, 2nd Misoprostol   GERD (gastroesophageal reflux disease)    Insomnia    Trichomonas vaginitis    UTI (lower urinary tract infection)     Past Surgical History:  Procedure Laterality Date   ECTOPIC PREGNANCY SURGERY Right 2008    Family Psychiatric History: Sister: Depression/attempted suicide in past, Paternal aunt: Bipolar-attempted suicide in past   Family History:  Family History  Problem Relation Age of Onset   Cancer Sister 81   Healthy Mother    Healthy Father    COPD Sister    Alcohol abuse Neg Hx    Arthritis Neg Hx    Asthma Neg Hx    Birth defects Neg Hx    Depression Neg Hx    Diabetes Neg Hx    Drug abuse Neg Hx    Early death Neg Hx    Hearing loss Neg Hx    Heart disease Neg Hx    Hyperlipidemia Neg Hx    Hypertension Neg Hx    Kidney disease Neg Hx    Learning disabilities Neg Hx    Mental illness Neg Hx    Mental retardation Neg Hx    Miscarriages / Stillbirths Neg Hx     Stroke Neg Hx    Vision loss Neg Hx    Varicose Veins Neg Hx     Social History:   Social History   Socioeconomic History   Marital status: Single    Spouse name: Not on file   Number of children: 0   Years of education: college-1   Highest education level: Not on file  Occupational History   Occupation: unemployed    Comment: Previously worked [employer], Clinical biochemist.  Tobacco Use   Smoking status: Former    Current packs/day: 0.50    Average packs/day: 0.5 packs/day for 24.6 years (12.3 ttl pk-yrs)    Types: Cigarettes    Start date: 05/15/1999   Smokeless tobacco: Never   Tobacco comments:    0.5 PPD, has now started vaping instead (05/05/22)  Vaping Use   Vaping status: Every Day   Substances: Nicotine, THC, CBD  Substance and Sexual Activity   Alcohol use: Not Currently    Comment: occasionally   Drug use:  Not Currently    Comment: Last used June 2019. Marijuana: On Saturday   Sexual activity: Not Currently    Partners: Male    Birth control/protection: Condom    Comment: 1 partner  Other Topics Concern   Not on file  Social History Narrative   Born in [place], [place], in 1998 when her mother got a job in one of the [place].   Now living with her father.  Sometimes with her sister.   Parents separated.   Social Drivers of Health   Financial Resource Strain: High Risk (07/20/2021)   Overall Financial Resource Strain (CARDIA)    Difficulty of Paying Living Expenses: Very hard  Food Insecurity: Food Insecurity Present (07/20/2021)   Hunger Vital Sign    Worried About Running Out of Food in the Last Year: Often true    Ran Out of Food in the Last Year: Often true  Transportation Needs: Unmet Transportation Needs (07/20/2021)   PRAPARE - Administrator, Civil Service (Medical): Yes    Lack of Transportation (Non-Medical): Yes  Physical Activity: Inactive (07/20/2021)   Exercise Vital Sign    Days of  Exercise per Week: 0 days    Minutes of Exercise per Session: 0 min  Stress: Stress Concern Present (07/20/2021)   [employer] of Occupational Health - Occupational Stress Questionnaire    Feeling of Stress : Very much  Social Connections: Socially Isolated (07/20/2021)   Social Connection and Isolation Panel [NHANES]    Frequency of Communication with Friends and Family: Once a week    Frequency of Social Gatherings with Friends and Family: Never    Attends Religious Services: 1 to 4 times per year    Active Member of [employer] or Organizations: No    Attends Banker Meetings: Never    Marital Status: Never married    Allergies:  Allergies  Allergen Reactions   Depakene [Divalproex Sodium] Other (See Comments)    "made me feel like I was going to pass out"    Current Medications: Current Outpatient Medications  Medication Sig Dispense Refill   ARIPiprazole (ABILIFY) 2 MG tablet Take 1 tablet (2 mg total) by mouth daily. 30 tablet 3   ARIPiprazole ER (ABILIFY ASIMTUFII) 960 MG/3.2ML PRSY Inject 960 mg into the muscle every 2 months. 3.2 mL 11   buprenorphine-naloxone (SUBOXONE) 8-2 mg SUBL SL tablet Place 1 tablet under the tongue 2 (two) times daily. 28 tablet 1   DENTA 5000 PLUS 1.1 % CREA dental cream Take 1 Application by mouth daily.     hydrOXYzine (ATARAX) 25 MG tablet Take 1 tablet (25 mg total) by mouth 3 (three) times daily as needed for anxiety or itching. 90 tablet 3   ibuprofen (ADVIL,MOTRIN) 200 MG tablet Take 800 mg by mouth every 6 (six) hours as needed for headache or moderate pain (pain score 4-6).     metroNIDAZOLE (FLAGYL) 500 MG tablet Take 1 tablet (500 mg total) by mouth 2 (two) times daily. 14 tablet 0   mirtazapine (REMERON) 15 MG tablet Take 1 tablet (15 mg total) by mouth at bedtime. 30 tablet 3   nitrofurantoin, macrocrystal-monohydrate, (MACROBID) 100 MG capsule Take 1 capsule (100 mg total) by mouth 2 (two) times daily. 10 capsule 0    Prenatal Vit-Fe Fumarate-FA (M-NATAL PLUS) 27-1 MG TABS Take 1 tablet by mouth daily.     senna (SENOKOT) 8.6 MG TABS tablet Take 1  tablet by mouth daily. (Patient not taking: Reported on 08/10/2023) 30 tablet 1   No current facility-administered medications for this visit.   Psychiatric Specialty Exam: Physical Exam Constitutional:      Appearance: the patient is not toxic-appearing.  Pulmonary:     Effort: Pulmonary effort is normal.  Neurological:     General: No focal deficit present.     Mental Status: the patient is alert and oriented to person, place, and time.   Review of Systems  Respiratory:  Negative for shortness of breath.   Cardiovascular:  Negative for chest pain.  Gastrointestinal:  Negative for abdominal pain, constipation, diarrhea, nausea and vomiting.  Neurological:  Negative for headaches.      There were no vitals taken for this visit.  General Appearance: Fairly Groomed  Eye Contact:  Good  Speech:  Clear and Coherent  Volume:  Normal  Mood:  Euthymic  Affect:  Congruent  Thought Process:  Coherent  Orientation:  Full (Time, Place, and Person)  Thought Content: Logical   Suicidal Thoughts:  No  Homicidal Thoughts:  No  Memory:  Immediate;   Good  Judgement:  fair  Insight:  fair  Psychomotor Activity:  Normal  Concentration:  Concentration: Good  Recall:  Good  Fund of Knowledge: Good  Language: Good  Akathisia:  No  Handed:  not assessed  AIMS (if indicated): not done  Assets:  Communication Skills Desire for Improvement Financial Resources/Insurance Housing Leisure Time Physical Health  ADL's:  Intact  Cognition: WNL  Sleep:  Fair     Metabolic Disorder Labs: Lab Results  Component Value Date   HGBA1C 6.0 (H) 02/21/2015   MPG 126 02/21/2015   No results found for: "PROLACTIN" Lab Results  Component Value Date   CHOL 138 02/21/2015   TRIG 60 02/21/2015   HDL 51 02/21/2015   CHOLHDL 2.7 02/21/2015   VLDL 12 02/21/2015    LDLCALC 75 02/21/2015   LDLCALC 86 04/03/2009   Lab Results  Component Value Date   TSH 0.299 (L) 02/20/2015   TSH 0.519 05/10/2010    Therapeutic Level Labs: No results found for: "LITHIUM" No results found for: "VALPROATE" Lab Results  Component Value Date   CBMZ 7.1 02/21/2015    Screenings: AIMS    Flowsheet Row [facility] Visit from 06/13/2023 in [facility]) [facility]) [facility]) [facility]  AIMS Total Score 0 0 0 0      AUDIT    Flowsheet Row Counselor from 07/20/2021 in [facility] Admission (Discharged) [facility] (Dis[facility]) [facility] ([facility]) [facility]  Alcohol Use Disorder Identification Test Final Score (AUDIT) 7 3 18 1       GAD-7    Flowsheet Row Office Visit from 07/25/2023 in [facility] Most recent reading at 07/25/2023  4:58 PM Office Visit from 06/13/2023 in [facility] Most recent reading at 06/13/2023 10:35 AM Clinical Support from 06/13/2023 in [facility] Most recent reading at 06/13/2023 10:23 AM Office Visit from 02/07/2023 in [facility] Most recent reading at 02/07/2023  1:36 PM Office Visit from 08/03/2022 in [facility] Most recent reading at 08/03/2022  3:48 PM  Total GAD-7 Score 20 16 16 21 20       PHQ2-9    Flowsheet Row Office Visit from 07/25/2023 in [facility] Most recent reading at 07/25/2023  4:57 PM Office Visit from 06/13/2023 in [facility] Most recent reading at 06/13/2023 10:35 AM Clinical Support from 06/13/2023 in [facility] Most recent reading at 06/13/2023 10:22 AM Office Visit from 02/07/2023 in [facility] Most recent reading at 02/07/2023  1:35 PM Office Visit from 08/03/2022 in [facility] Most recent reading at 08/03/2022  3:46 PM  PHQ-2 Total Score 4 4 4 6 4   PHQ-9 Total Score 16 12 12 27 21       Flowsheet Row ED from 10/23/2023 in [facility] Visit from 02/07/2023 in [facility] Office Visit from 05/05/2022 in [facility]  C-SSRS RISK CATEGORY No Risk Error: Q7 should not be populated when Q6 is No No Risk       Collaboration of Care: Collaboration of Care:  Patient/Guardian was advised Release of Information must be obtained prior to any record release in order to collaborate their care with an outside provider. Patient/Guardian was advised if they have not already done so to contact the registration department to sign all necessary forms in order for [place] to release information regarding their care.   Consent: Patient/Guardian gives verbal consent for treatment and assignment of benefits for services provided during this visit. Patient/Guardian expressed understanding and agreed to proceed.   Park Pope, MD 12/08/2023, 8:12 AM

## 2023-12-11 ENCOUNTER — Other Ambulatory Visit: Payer: Self-pay

## 2023-12-14 ENCOUNTER — Telehealth (HOSPITAL_COMMUNITY): Payer: Self-pay | Admitting: *Deleted

## 2023-12-14 NOTE — Telephone Encounter (Signed)
Submitted PA request for Abilify Asimtufii and it has been approved via Cover My Meds. Notified the pharmacy which [facility] is approved till 12/13/2024.

## 2023-12-18 ENCOUNTER — Telehealth (HOSPITAL_COMMUNITY): Payer: Self-pay

## 2023-12-18 ENCOUNTER — Other Ambulatory Visit: Payer: Self-pay

## 2023-12-18 DIAGNOSIS — F3162 Bipolar disorder, current episode mixed, moderate: Secondary | ICD-10-CM

## 2023-12-18 MED ORDER — OXCARBAZEPINE 150 MG PO TABS
150.0000 mg | ORAL_TABLET | Freq: Two times a day (BID) | ORAL | 0 refills | Status: DC
Start: 2023-12-18 — End: 2024-02-01
  Filled 2023-12-18: qty 60, 30d supply, fill #0

## 2023-12-18 NOTE — Addendum Note (Signed)
 Addended by: Park Pope B on: 12/18/2023 04:08 PM   Modules accepted: Orders

## 2023-12-18 NOTE — Telephone Encounter (Signed)
Med sent to preferred pharmacy

## 2023-12-18 NOTE — Telephone Encounter (Signed)
 Hello,    Pt/ Pharmacy is requesting for a refill of OXcarbazepine (TRILEPTAL) 150 MG tablet  to be sent to pharmacy.

## 2023-12-20 ENCOUNTER — Other Ambulatory Visit (HOSPITAL_COMMUNITY): Payer: MEDICAID

## 2023-12-20 ENCOUNTER — Encounter (HOSPITAL_COMMUNITY): Payer: Self-pay

## 2024-01-03 ENCOUNTER — Other Ambulatory Visit (INDEPENDENT_AMBULATORY_CARE_PROVIDER_SITE_OTHER): Payer: MEDICAID

## 2024-01-03 DIAGNOSIS — Z79899 Other long term (current) drug therapy: Secondary | ICD-10-CM

## 2024-01-03 DIAGNOSIS — F3162 Bipolar disorder, current episode mixed, moderate: Secondary | ICD-10-CM

## 2024-01-03 DIAGNOSIS — F3132 Bipolar disorder, current episode depressed, moderate: Secondary | ICD-10-CM | POA: Diagnosis not present

## 2024-01-03 NOTE — Progress Notes (Signed)
 Pt was referred to [facility] as I attempted two draws and was not successful. Pt was understanding and was okay with going to [facility].    JNL, CMA

## 2024-01-11 ENCOUNTER — Telehealth (HOSPITAL_COMMUNITY): Payer: MEDICAID | Admitting: Student

## 2024-01-11 ENCOUNTER — Encounter (HOSPITAL_COMMUNITY): Payer: Self-pay

## 2024-01-11 NOTE — Progress Notes (Deleted)
 BH MD Outpatient Progress Note  01/11/2024 1:13 PM Natalie Fischer  MRN:  [id number]  Assessment:  Natalie Fischer presents to as a walk-in for medication management.  She wanted to establish care with an outpatient psychiatrist.  She has been regularly seen at the [facility] clinic for Abilify asimtufii 960.  She reports hypomania/manic symptoms prior to any substance use as well as presently experiencing it still.  She also reports inattention but this is likely secondary to not being on cocaine anymore.  It is unclear whether patient's hypomania symptoms are more signs of euthymia versus true bipolar symptoms.  Will continue evaluate patient and monitor for improvement. She continues to be sober from any substance use since 6 months ago.  Given reported insomnia, plan to start trazodone to aid with sleep.  Patient also continues to endorse racing thoughts which could be a component of bipolar disorder or anxiety so we will start Trileptal 150 mg twice daily which she previously had been on.  Lab appointment last visit was   Would recommend that the next regular psychiatric provider reconsider diagnosis of bipolar affective disorder, since it appears this diagnosis may have been confounded by significant substance use.   Identifying Information: Natalie Fischer is a 43 y.o. 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   Plan:  # Bipolar disorder, currently depressed #Generalized anxiety disorder Status of problem: Active Interventions: -- Continue Abilify ER Asimtufii 960 mg every 2 months -- Needs ECG, lipid panel, A1c (lab appointment) -- Continue Abilify 2 mg daily --Continue hydroxyzine 25 mg 3 times daily as needed --Continue mirtazapine 15 mg nightly --Start trazodone 25-50 mg  nightly as needed for insomnia --Start Trileptal 150 mg twice daily for mood stabilization  # Opiate use disorder in early remission #Stimulant use disorder, in early remission #Sedative use, early remission Past medication trials:  Status of problem: Remission Interventions: -- Advised continued cessation -- Patient on suboxone through Alfredo Bach at [facility] stop --Recommend substance use counseling in order to maintain sobriety  Patient was given contact information for behavioral health clinic and was instructed to call 911 for emergencies.    Subjective:  Chief Complaint: Medication Management   Interval History:  Patient reports concern that she has not been sleeping very well for a little while long time but did not sleep at all last night.  She reports that she feels the Abilify injection does not last the full 8 weeks as she starts having "hypomanic symptoms" about 1 week prior to her next injection appointment.  She also reports significant inattention and memory problems although she does report that this seems to have started after she stopped using stimulants.  She reports hypomanic symptoms with euphoria and racing thoughts since age of 66.  She reports first using cannabis at age 89.  She reports her primary substance of choice is snorting cocaine since age 59 up until 6 months ago.  She did have 1.5 years spent using heroin but stopped all substance use approximately 6 months ago. She denies current SI/HI/AVH.  Per Dr. Aniceto Boss note yesterday: Patient seen as a follow-up for outpatient [facility] clinic.  In addition to the information described above, patient reports having support from her fianc of 7 years, as well as her mother and her sister.  She reports experiencing some  depression over the past month but that this has not been too severe.  She reports experiencing passive suicidal thoughts on 1 occasion over the past several weeks.  She had thoughts of not wanting to be alive for  a few hours.  She took no action on this thought.  She has no plans or intentions of harming herself.  The patient reports that her manic symptoms have consisted of poor focus and racing thoughts with some mood elevation.  No major impulsive behavior, grandiosity, or rapid speech.  The patient denies any illegal substance use.  She reports having a few drinks of alcohol on 1 occasion over the past few months.  The patient reports a few instances of "seeing bugs that were not really there".  She denies associated auditory hallucinations or paranoia.  Visit Diagnosis:  No diagnosis found.   Past Psychiatric History:  anxiety, depression, bipolar disorder, substance use (cocaine, marijuana, opioids, and benzos)   Past Medical History:  Past Medical History:  Diagnosis Date   Anxiety    Bipolar 1 disorder (HCC)    Carpal tunnel syndrome of right wrist 2016   Chlamydia    Constipation    Ectopic pregnancy 2008 and 2014   2008 required surgery, 2nd Misoprostol   GERD (gastroesophageal reflux disease)    Insomnia    Trichomonas vaginitis    UTI (lower urinary tract infection)     Past Surgical History:  Procedure Laterality Date   ECTOPIC PREGNANCY SURGERY Right 2008    Family Psychiatric History: Sister: Depression/attempted suicide in past, Paternal aunt: Bipolar-attempted suicide in past   Family History:  Family History  Problem Relation Age of Onset   Cancer Sister 80   Healthy Mother    Healthy Father    COPD Sister    Alcohol abuse Neg Hx    Arthritis Neg Hx    Asthma Neg Hx    Birth defects Neg Hx    Depression Neg Hx    Diabetes Neg Hx    Drug abuse Neg Hx    Early death Neg Hx    Hearing loss Neg Hx    Heart disease Neg Hx    Hyperlipidemia Neg Hx    Hypertension Neg Hx    Kidney disease Neg Hx    Learning disabilities Neg Hx    Mental illness Neg Hx    Mental retardation Neg Hx    Miscarriages / Stillbirths Neg Hx    Stroke Neg Hx    Vision loss Neg Hx     Varicose Veins Neg Hx     Social History:   Social History   Socioeconomic History   Marital status: Single    Spouse name: Not on file   Number of children: 0   Years of education: college-1   Highest education level: Not on file  Occupational History   Occupation: unemployed    Comment: Previously worked [employer], Clinical biochemist.  Tobacco Use   Smoking status: Former    Current packs/day: 0.50    Average packs/day: 0.5 packs/day for 24.7 years (12.3 ttl pk-yrs)    Types: Cigarettes    Start date: 05/15/1999   Smokeless tobacco: Never   Tobacco comments:    0.5 PPD, has now started vaping instead (05/05/22)  Vaping Use   Vaping status: Every Day   Substances: Nicotine, THC, CBD  Substance and Sexual Activity   Alcohol use: Not Currently    Comment: occasionally   Drug use: Not Currently  Comment: Last used June 2019. Marijuana: On Saturday   Sexual activity: Not Currently    Partners: Male    Birth control/protection: Condom    Comment: 1 partner  Other Topics Concern   Not on file  Social History Narrative   Born in [place], [place] in 1998 when her mother got a job in one of the [place] here.   Now living with her father.  Sometimes with her sister.   Parents separated.   Social Drivers of Health   Financial Resource Strain: High Risk (07/20/2021)   Overall Financial Resource Strain (CARDIA)    Difficulty of Paying Living Expenses: Very hard  Food Insecurity: Food Insecurity Present (07/20/2021)   Hunger Vital Sign    Worried About Running Out of Food in the Last Year: Often true    Ran Out of Food in the Last Year: Often true  Transportation Needs: Unmet Transportation Needs (07/20/2021)   PRAPARE - Administrator, Civil Service (Medical): Yes    Lack of Transportation (Non-Medical): Yes  Physical Activity: Inactive (07/20/2021)   Exercise Vital Sign    Days of Exercise per Week: 0 days    Minutes of  Exercise per Session: 0 min  Stress: Stress Concern Present (07/20/2021)   [employer] of Occupational Health - Occupational Stress Questionnaire    Feeling of Stress : Very much  Social Connections: Socially Isolated (07/20/2021)   Social Connection and Isolation Panel [NHANES]    Frequency of Communication with Friends and Family: Once a week    Frequency of Social Gatherings with Friends and Family: Never    Attends Religious Services: 1 to 4 times per year    Active Member of [employer] or Organizations: No    Attends Banker Meetings: Never    Marital Status: Never married    Allergies:  Allergies  Allergen Reactions   Depakene [Divalproex Sodium] Other (See Comments)    "made me feel like I was going to pass out"    Current Medications: Current Outpatient Medications  Medication Sig Dispense Refill   ARIPiprazole (ABILIFY) 2 MG tablet Take 1 tablet (2 mg total) by mouth daily. 90 tablet 0   ARIPiprazole ER (ABILIFY ASIMTUFII) 960 MG/3.2ML PRSY Inject 960 mg into the muscle every 2 months. 3.2 mL 11   buprenorphine-naloxone (SUBOXONE) 8-2 mg SUBL SL tablet Place 1 tablet under the tongue 2 (two) times daily. 28 tablet 1   DENTA 5000 PLUS 1.1 % CREA dental cream Take 1 Application by mouth daily.     hydrOXYzine (ATARAX) 25 MG tablet Take 1 tablet (25 mg total) by mouth 3 (three) times daily as needed for anxiety or itching. 90 tablet 0   ibuprofen (ADVIL,MOTRIN) 200 MG tablet Take 800 mg by mouth every 6 (six) hours as needed for headache or moderate pain (pain score 4-6).     metroNIDAZOLE (FLAGYL) 500 MG tablet Take 1 tablet (500 mg total) by mouth 2 (two) times daily. 14 tablet 0   mirtazapine (REMERON) 15 MG tablet Take 1 tablet (15 mg total) by mouth at bedtime. 90 tablet 0   nitrofurantoin, macrocrystal-monohydrate, (MACROBID) 100 MG capsule Take 1 capsule (100 mg total) by mouth 2 (two) times daily. 10 capsule 0   OXcarbazepine (TRILEPTAL) 150 MG tablet Take 1  tablet (150 mg total) by mouth 2 (two) times daily. 60 tablet 0   Prenatal Vit-Fe Fumarate-FA (M-NATAL PLUS) 27-1 MG TABS Take  1 tablet by mouth daily.     senna (SENOKOT) 8.6 MG TABS tablet Take 1 tablet by mouth daily. (Patient not taking: Reported on 08/10/2023) 30 tablet 1   traZODone (DESYREL) 50 MG tablet Take 0.5-1 tablets (25-50 mg total) by mouth at bedtime. 90 tablet 0   No current facility-administered medications for this visit.   Psychiatric Specialty Exam: Physical Exam Constitutional:      Appearance: the patient is not toxic-appearing.  Pulmonary:     Effort: Pulmonary effort is normal.  Neurological:     General: No focal deficit present.     Mental Status: the patient is alert and oriented to person, place, and time.   Review of Systems  Respiratory:  Negative for shortness of breath.   Cardiovascular:  Negative for chest pain.  Gastrointestinal:  Negative for abdominal pain, constipation, diarrhea, nausea and vomiting.  Neurological:  Negative for headaches.      There were no vitals taken for this visit.  General Appearance: Fairly Groomed  Eye Contact:  Good  Speech:  Clear and Coherent  Volume:  Normal  Mood:  Euthymic  Affect:  Congruent  Thought Process:  Coherent  Orientation:  Full (Time, Place, and Person)  Thought Content: Logical   Suicidal Thoughts:  No  Homicidal Thoughts:  No  Memory:  Immediate;   Good  Judgement:  fair  Insight:  fair  Psychomotor Activity:  Normal  Concentration:  Concentration: Good  Recall:  Good  Fund of Knowledge: Good  Language: Good  Akathisia:  No  Handed:  not assessed  AIMS (if indicated): not done  Assets:  Communication Skills Desire for Improvement Financial Resources/Insurance Housing Leisure Time Physical Health  ADL's:  Intact  Cognition: WNL  Sleep:  Fair     Metabolic Disorder Labs: Lab Results  Component Value Date   HGBA1C 6.0 (H) 02/21/2015   MPG 126 02/21/2015   No results found  for: "PROLACTIN" Lab Results  Component Value Date   CHOL 138 02/21/2015   TRIG 60 02/21/2015   HDL 51 02/21/2015   CHOLHDL 2.7 02/21/2015   VLDL 12 02/21/2015   LDLCALC 75 02/21/2015   LDLCALC 86 04/03/2009   Lab Results  Component Value Date   TSH 0.299 (L) 02/20/2015   TSH 0.519 05/10/2010    Therapeutic Level Labs: No results found for: "LITHIUM" No results found for: "VALPROATE" Lab Results  Component Value Date   CBMZ 7.1 02/21/2015    Screenings: AIMS    Flowsheet Row [facility] Visit from 06/13/2023 in [facility]) [facility]) [facility]  AIMS Total Score 0 0 0 0      AUDIT    Flowsheet Row Counselor from 07/20/2021 in [facility] Admission (Discharged) [facility]) from [facility]Dis[facility]) [facility]  Alcohol Use Disorder Identification Test Final Score (AUDIT) 7 3 18 1       GAD-7    Flowsheet Row Office Visit from 07/25/2023 in [facility] Most recent reading at 07/25/2023  4:58 PM Office Visit from 06/13/2023 in [facility] Most recent reading at 06/13/2023 10:35 AM Clinical Support from 06/13/2023 in [facility] Most recent reading at 06/13/2023 10:23 AM Office Visit from 02/07/2023 in [facility] Most recent reading at 02/07/2023  1:36 PM Office Visit from 08/03/2022 in [facility] Most recent reading at 08/03/2022  3:48 PM  Total GAD-7 Score 20 16 16 21 20        PHQ2-9    Flowsheet Row Office Visit from 07/25/2023 in [facility] Most recent reading at 07/25/2023  4:57 PM Office Visit from 06/13/2023 in [facility] Most recent reading at 06/13/2023 10:35 AM Clinical Support from 06/13/2023 in [facility] Most recent reading at 06/13/2023 10:22 AM Office Visit from 02/07/2023 in [facility] Most recent reading at 02/07/2023  1:35 PM Office Visit from 08/03/2022 in [facility] Most recent reading at 08/03/2022  3:46 PM  PHQ-2 Total Score 4 4 4 6 4   PHQ-9 Total Score 16 12 12 27 21       Flowsheet Row ED from 10/23/2023 in [facility] Visit from 02/07/2023 in [facility] Office Visit from 05/05/2022 in [facility]  C-SSRS RISK CATEGORY No Risk Error: Q7 should not be populated when Q6 is No No Risk       Collaboration of Care: Collaboration of Care:  Patient/Guardian was advised Release of Information must be obtained prior to any record release in order to collaborate their care with an outside provider. Patient/Guardian was advised if they have not already done so to contact the registration department to sign all necessary forms in order for [place] to release information regarding their care.   Consent: Patient/Guardian gives verbal consent for treatment and assignment of benefits for services provided during this visit. Patient/Guardian expressed understanding and agreed to proceed.   Park Pope, MD 01/11/2024, 1:13 PM

## 2024-01-18 ENCOUNTER — Encounter (HOSPITAL_COMMUNITY): Payer: Self-pay

## 2024-01-18 ENCOUNTER — Ambulatory Visit (HOSPITAL_COMMUNITY): Payer: MEDICAID

## 2024-02-01 ENCOUNTER — Encounter (HOSPITAL_COMMUNITY): Payer: Self-pay | Admitting: Student

## 2024-02-01 ENCOUNTER — Ambulatory Visit (INDEPENDENT_AMBULATORY_CARE_PROVIDER_SITE_OTHER): Payer: MEDICAID | Admitting: Student

## 2024-02-01 ENCOUNTER — Ambulatory Visit (INDEPENDENT_AMBULATORY_CARE_PROVIDER_SITE_OTHER): Payer: MEDICAID

## 2024-02-01 VITALS — BP 130/89 | HR 90 | Ht 71.0 in | Wt 159.0 lb

## 2024-02-01 DIAGNOSIS — F3132 Bipolar disorder, current episode depressed, moderate: Secondary | ICD-10-CM

## 2024-02-01 DIAGNOSIS — F411 Generalized anxiety disorder: Secondary | ICD-10-CM

## 2024-02-01 DIAGNOSIS — Z79899 Other long term (current) drug therapy: Secondary | ICD-10-CM

## 2024-02-01 DIAGNOSIS — F1120 Opioid dependence, uncomplicated: Secondary | ICD-10-CM

## 2024-02-01 DIAGNOSIS — F1321 Sedative, hypnotic or anxiolytic dependence, in remission: Secondary | ICD-10-CM

## 2024-02-01 DIAGNOSIS — F1590 Other stimulant use, unspecified, uncomplicated: Secondary | ICD-10-CM

## 2024-02-01 DIAGNOSIS — F3162 Bipolar disorder, current episode mixed, moderate: Secondary | ICD-10-CM

## 2024-02-01 MED ORDER — ARIPIPRAZOLE ER 960 MG/3.2ML IM PRSY
960.0000 mg | PREFILLED_SYRINGE | Freq: Once | INTRAMUSCULAR | Status: AC
  Administered 2024-02-01: 960 mg via INTRAMUSCULAR

## 2024-02-01 MED ORDER — OXCARBAZEPINE 300 MG PO TABS
300.0000 mg | ORAL_TABLET | Freq: Two times a day (BID) | ORAL | 0 refills | Status: DC
Start: 2024-02-01 — End: 2024-03-27

## 2024-02-01 MED ORDER — MIRTAZAPINE 15 MG PO TABS: 15.0000 mg | ORAL_TABLET | Freq: Every day | ORAL | 0 refills | Status: DC

## 2024-02-01 NOTE — Progress Notes (Signed)
 Pt presents today for injection of Abilify Asimtufii. Pt has no concerns regarding injection today and was tolerated successfully in Left Upper Outer Quadrant . Patient is lovely as always with  a very sweet personality. Pt states that she is not experiencing any SI, and HI, or AVH. Pt does state that she is forgetting things more than usual, and having trouble sleeping. This was addressed to provider of the day.

## 2024-02-01 NOTE — Progress Notes (Deleted)
 BH MD Outpatient Progress Note  02/01/2024 1:23 PM Natalie Fischer  MRN:  [id number]  Assessment:  Natalie Fischer presents to as a walk-in for medication management.  She wanted to establish care with an outpatient psychiatrist.  She has been regularly seen at the [facility] clinic for Abilify asimtufii 960.  She reports hypomania/manic symptoms prior to any substance use as well as presently experiencing it still.  She also reports inattention but this is likely secondary to not being on cocaine anymore.  It is unclear whether patient's hypomania symptoms are more signs of euthymia versus true bipolar symptoms.  Will continue evaluate patient and monitor for improvement. She continues to be sober from any substance use since 6 months ago.  Given reported insomnia, plan to start trazodone to aid with sleep.  Patient also continues to endorse racing thoughts which could be a component of bipolar disorder or anxiety so we will start Trileptal 150 mg twice daily which she previously had been on.  Will monitor for hyponatremia with next lab appointment.  Would recommend that the next regular psychiatric provider reconsider diagnosis of bipolar affective disorder, since it appears this diagnosis may have been confounded by significant substance use.   Identifying Information: Natalie Fischer is a 43 y.o. 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   Plan:  # Bipolar disorder, currently depressed #Generalized anxiety disorder Status of problem: Active Interventions: -- Continue Abilify ER Asimtufii 960 mg every 2 months -- Needs ECG, lipid panel, A1c (lab appointment) -- Continue Abilify 2 mg daily --Continue hydroxyzine 25 mg 3 times daily as needed --Continue mirtazapine 15 mg  nightly --Start trazodone 25-50 mg nightly as needed for insomnia --Start Trileptal 150 mg twice daily for mood stabilization  # Opiate use disorder in early remission #Stimulant use disorder, in early remission #Sedative use, early remission Past medication trials:  Status of problem: Remission Interventions: -- Advised continued cessation -- Patient on suboxone through Alfredo Bach at [facility] stop --Recommend substance use counseling in order to maintain sobriety  Patient was given contact information for behavioral health clinic and was instructed to call 911 for emergencies.    Subjective:  Chief Complaint: Medication Management   Interval History:  Patient reports concern that she has not been sleeping very well for a little while long time but did not sleep at all last night.  She reports that she feels the Abilify injection does not last the full 8 weeks as she starts having "hypomanic symptoms" about 1 week prior to her next injection appointment.  She also reports significant inattention and memory problems although she does report that this seems to have started after she stopped using stimulants.  She reports hypomanic symptoms with euphoria and racing thoughts since age of 89.  She reports first using cannabis at age 89.  She reports her primary substance of choice is snorting cocaine since age 44 up until 6 months ago.  She did have 1.5 years spent using heroin but stopped all substance use approximately 6 months ago. She denies current SI/HI/AVH.  Per Dr. Aniceto Boss note yesterday: Patient seen as a follow-up for outpatient [facility] clinic.  In addition to the information described above, patient reports having support from her fianc of 7 years, as well as her mother and her sister.  She reports  experiencing some depression over the past month but that this has not been too severe.  She reports experiencing passive suicidal thoughts on 1 occasion over the past several weeks.  She had  thoughts of not wanting to be alive for a few hours.  She took no action on this thought.  She has no plans or intentions of harming herself.  The patient reports that her manic symptoms have consisted of poor focus and racing thoughts with some mood elevation.  No major impulsive behavior, grandiosity, or rapid speech.  The patient denies any illegal substance use.  She reports having a few drinks of alcohol on 1 occasion over the past few months.  The patient reports a few instances of "seeing bugs that were not really there".  She denies associated auditory hallucinations or paranoia.  Visit Diagnosis:  No diagnosis found.   Past Psychiatric History:  anxiety, depression, bipolar disorder, substance use (cocaine, marijuana, opioids, and benzos)   Past Medical History:  Past Medical History:  Diagnosis Date   Anxiety    Bipolar 1 disorder (HCC)    Carpal tunnel syndrome of right wrist 2016   Chlamydia    Constipation    Ectopic pregnancy 2008 and 2014   2008 required surgery, 2nd Misoprostol   GERD (gastroesophageal reflux disease)    Insomnia    Trichomonas vaginitis    UTI (lower urinary tract infection)     Past Surgical History:  Procedure Laterality Date   ECTOPIC PREGNANCY SURGERY Right 2008    Family Psychiatric History: Sister: Depression/attempted suicide in past, Paternal aunt: Bipolar-attempted suicide in past   Family History:  Family History  Problem Relation Age of Onset   Cancer Sister 64   Healthy Mother    Healthy Father    COPD Sister    Alcohol abuse Neg Hx    Arthritis Neg Hx    Asthma Neg Hx    Birth defects Neg Hx    Depression Neg Hx    Diabetes Neg Hx    Drug abuse Neg Hx    Early death Neg Hx    Hearing loss Neg Hx    Heart disease Neg Hx    Hyperlipidemia Neg Hx    Hypertension Neg Hx    Kidney disease Neg Hx    Learning disabilities Neg Hx    Mental illness Neg Hx    Mental retardation Neg Hx    Miscarriages / Stillbirths Neg Hx     Stroke Neg Hx    Vision loss Neg Hx    Varicose Veins Neg Hx     Social History:   Social History   Socioeconomic History   Marital status: Single    Spouse name: Not on file   Number of children: 0   Years of education: college-1   Highest education level: Not on file  Occupational History   Occupation: unemployed    Comment: Previously worked [employer], Clinical biochemist.  Tobacco Use   Smoking status: Former    Current packs/day: 0.50    Average packs/day: 0.5 packs/day for 24.7 years (12.4 ttl pk-yrs)    Types: Cigarettes    Start date: 05/15/1999   Smokeless tobacco: Never   Tobacco comments:    0.5 PPD, has now started vaping instead (05/05/22)  Vaping Use   Vaping status: Every Day   Substances: Nicotine, THC, CBD  Substance and Sexual Activity   Alcohol use: Not Currently    Comment: occasionally   Drug use:  Not Currently    Comment: Last used June 2019. Marijuana: On Saturday   Sexual activity: Not Currently    Partners: Male    Birth control/protection: Condom    Comment: 1 partner  Other Topics Concern   Not on file  Social History Narrative   [place], [place], in 1998 when her mother got a job in [place] of the [place].   Now living with her father.  Sometimes with her sister.   Parents separated.   Social Drivers of Health   Financial Resource Strain: High Risk (07/20/2021)   Overall Financial Resource Strain (CARDIA)    Difficulty of Paying Living Expenses: Very hard  Food Insecurity: Food Insecurity Present (07/20/2021)   Hunger Vital Sign    Worried About Running Out of Food in the Last Year: Often true    Ran Out of Food in the Last Year: Often true  Transportation Needs: Unmet Transportation Needs (07/20/2021)   PRAPARE - Administrator, Civil Service (Medical): Yes    Lack of Transportation (Non-Medical): Yes  Physical Activity: Inactive (07/20/2021)   Exercise Vital Sign    Days of  Exercise per Week: 0 days    Minutes of Exercise per Session: 0 min  Stress: Stress Concern Present (07/20/2021)   [employer] of Occupational Health - Occupational Stress Questionnaire    Feeling of Stress : Very much  Social Connections: Socially Isolated (07/20/2021)   Social Connection and Isolation Panel [NHANES]    Frequency of Communication with Friends and Family: Once a week    Frequency of Social Gatherings with Friends and Family: Never    Attends Religious Services: 1 to 4 times per year    Active Member of [employer] or Organizations: No    Attends Banker Meetings: Never    Marital Status: Never married    Allergies:  Allergies  Allergen Reactions   Depakene [Divalproex Sodium] Other (See Comments)    "made me feel like I was going to pass out"    Current Medications: Current Outpatient Medications  Medication Sig Dispense Refill   ARIPiprazole (ABILIFY) 2 MG tablet Take 1 tablet (2 mg total) by mouth daily. 90 tablet 0   ARIPiprazole ER (ABILIFY ASIMTUFII) 960 MG/3.2ML PRSY Inject 960 mg into the muscle every 2 months. 3.2 mL 11   buprenorphine-naloxone (SUBOXONE) 8-2 mg SUBL SL tablet Place 1 tablet under the tongue 2 (two) times daily. 28 tablet 1   DENTA 5000 PLUS 1.1 % CREA dental cream Take 1 Application by mouth daily.     hydrOXYzine (ATARAX) 25 MG tablet Take 1 tablet (25 mg total) by mouth 3 (three) times daily as needed for anxiety or itching. 90 tablet 0   ibuprofen (ADVIL,MOTRIN) 200 MG tablet Take 800 mg by mouth every 6 (six) hours as needed for headache or moderate pain (pain score 4-6).     metroNIDAZOLE (FLAGYL) 500 MG tablet Take 1 tablet (500 mg total) by mouth 2 (two) times daily. 14 tablet 0   mirtazapine (REMERON) 15 MG tablet Take 1 tablet (15 mg total) by mouth at bedtime. 90 tablet 0   nitrofurantoin, macrocrystal-monohydrate, (MACROBID) 100 MG capsule Take 1 capsule (100 mg total) by mouth 2 (two) times daily. 10 capsule 0    OXcarbazepine (TRILEPTAL) 150 MG tablet Take 1 tablet (150 mg total) by mouth 2 (two) times daily. 60 tablet 0   Prenatal Vit-Fe Fumarate-FA (M-NATAL  PLUS) 27-1 MG TABS Take 1 tablet by mouth daily.     senna (SENOKOT) 8.6 MG TABS tablet Take 1 tablet by mouth daily. (Patient not taking: Reported on 08/10/2023) 30 tablet 1   traZODone (DESYREL) 50 MG tablet Take 0.5-1 tablets (25-50 mg total) by mouth at bedtime. 90 tablet 0   No current facility-administered medications for this visit.   Psychiatric Specialty Exam: Physical Exam Constitutional:      Appearance: the patient is not toxic-appearing.  Pulmonary:     Effort: Pulmonary effort is normal.  Neurological:     General: No focal deficit present.     Mental Status: the patient is alert and oriented to person, place, and time.   Review of Systems  Respiratory:  Negative for shortness of breath.   Cardiovascular:  Negative for chest pain.  Gastrointestinal:  Negative for abdominal pain, constipation, diarrhea, nausea and vomiting.  Neurological:  Negative for headaches.      There were no vitals taken for this visit.  General Appearance: Fairly Groomed  Eye Contact:  Good  Speech:  Clear and Coherent  Volume:  Normal  Mood:  Euthymic  Affect:  Congruent  Thought Process:  Coherent  Orientation:  Full (Time, Place, and Person)  Thought Content: Logical   Suicidal Thoughts:  No  Homicidal Thoughts:  No  Memory:  Immediate;   Good  Judgement:  fair  Insight:  fair  Psychomotor Activity:  Normal  Concentration:  Concentration: Good  Recall:  Good  Fund of Knowledge: Good  Language: Good  Akathisia:  No  Handed:  not assessed  AIMS (if indicated): not done  Assets:  Communication Skills Desire for Improvement Financial Resources/Insurance Housing Leisure Time Physical Health  ADL's:  Intact  Cognition: WNL  Sleep:  Fair     Metabolic Disorder Labs: Lab Results  Component Value Date   HGBA1C 6.0 (H)  02/21/2015   MPG 126 02/21/2015   No results found for: "PROLACTIN" Lab Results  Component Value Date   CHOL 138 02/21/2015   TRIG 60 02/21/2015   HDL 51 02/21/2015   CHOLHDL 2.7 02/21/2015   VLDL 12 02/21/2015   LDLCALC 75 02/21/2015   LDLCALC 86 04/03/2009   Lab Results  Component Value Date   TSH 0.299 (L) 02/20/2015   TSH 0.519 05/10/2010    Therapeutic Level Labs: No results found for: "LITHIUM" No results found for: "VALPROATE" Lab Results  Component Value Date   CBMZ 7.1 02/21/2015    Screenings: AIMS    Flowsheet Row [facility]13/2023 in [facility]) [facility]) [facility]) [facility]  AIMS Total Score 0 0 0 0      AUDIT    Flowsheet Row Counselor from 07/20/2021 in [facility] Admission (Discharged) [facility]) [facility]) [facility]  Alcohol Use Disorder Identification Test Final Score (AUDIT) 7 3 18 1       GAD-7    Flowsheet Row Office Visit from 07/25/2023 in [facility] Most recent reading at 07/25/2023  4:58 PM Office Visit from 06/13/2023 in [facility] Most recent reading at 06/13/2023 10:35 AM Clinical  Support from 06/13/2023 in [facility] Most recent reading at 06/13/2023 10:23 AM Office Visit from 02/07/2023 in [facility] Most recent reading at 02/07/2023  1:36 PM Office Visit from 08/03/2022 in [facility] Most recent reading at 08/03/2022   3:48 PM  Total GAD-7 Score 20 16 16 21 20       PHQ2-9    Flowsheet Row Office Visit from 07/25/2023 in [facility] Most recent reading at 07/25/2023  4:57 PM Office Visit from 06/13/2023 in [facility] Most recent reading at 06/13/2023 10:35 AM Clinical Support from 06/13/2023 in [facility] Most recent reading at 06/13/2023 10:22 AM Office Visit from 02/07/2023 in [facility] Most recent reading at 02/07/2023  1:35 PM Office Visit from 08/03/2022 in [facility] Most recent reading at 08/03/2022  3:46 PM  PHQ-2 Total Score 4 4 4 6 4   PHQ-9 Total Score 16 12 12 27 21       Flowsheet Row ED from 10/23/2023 in [facility] Visit from 02/07/2023 in [facility] Office Visit from 05/05/2022 in [facility]  C-SSRS RISK CATEGORY No Risk Error: Q7 should not be populated when Q6 is No No Risk       Collaboration of Care: Collaboration of Care:  Patient/Guardian was advised Release of Information must be obtained prior to any record release in order to collaborate their care with an outside provider. Patient/Guardian was advised if they have not already done so to contact the registration department to sign all necessary forms in order for [place] to release information regarding their care.   Consent: Patient/Guardian gives verbal consent for treatment and assignment of benefits for services provided during this visit. Patient/Guardian expressed understanding and agreed to proceed.   Park Pope, MD 02/01/2024, 1:23 PM

## 2024-02-01 NOTE — Progress Notes (Cosign Needed Addendum)
 BH MD Outpatient Progress Note  02/01/2024 5:02 PM Natalie Fischer  MRN:  [id number]  Assessment:  Natalie Fischer presents to as a walk-in for medication management.  She wanted to establish care with an outpatient psychiatrist.  She has been regularly seen at the [facility] clinic for Abilify  asimtufii 960.  She has been compliant with LAI injection but continued to have ongoing symptoms of hypomania. Trileptal  was initiated at the time to address symptoms. Patient continues to struggle with sporadic stimulant abuse which may also be contributing to her symptoms of hypomania.    Patient continues to endorse racing thoughts but feels that the Trileptal  did not help with this so we will increase it to better account for that ongoing racing thoughts.  She does admit to feeling as though her LAI "runs out" approximately1 week prior to her next injection so we will attempt to have her LAI every 7 weeks rather than every 8.  Patient will be getting labs through [facility] to monitor for metabolic symptoms.  Patient did admit to relapsing on cocaine last weekend but denies any opiate or sedative use.  Plan to continue to monitor this and advised patient to return to [facility] stop for substance use management and going back on Suboxone .   Identifying Information: Natalie Fischer is a 43 y.o. 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   Plan:  # Bipolar disorder, currently depressed #Generalized anxiety disorder Status of problem: Active Interventions: -- Continue Abilify  ER Asimtufii 960 mg every 7 weeks -- Needs ECG, lipid panel, A1c (lab appointment) -- Continue Abilify  2 mg daily --Continue hydroxyzine  25 mg 3 times daily as needed --Continue mirtazapine  15 mg nightly -- Continue trazodone  25-50 mg  nightly as needed for insomnia -- Increase Trileptal  to 300 mg twice daily for mood stabilization  # Opiate use disorder in early remission #Stimulant use disorder, in early remission #Sedative use, early remission Past medication trials:  Status of problem: Remission Interventions: -- Advised continued cessation -- Patient on suboxone  through [facility] stop --Recommend substance use counseling in order to maintain sobriety  Patient was given contact information for behavioral health clinic and was instructed to call 911 for emergencies.    Subjective:  Chief Complaint: Medication Management   Interval History:  Patient reports not sleeping well but it is improving with trazodone .  She reports that there are times where she will have initiated sleep but then abruptly wake up in the middle of the night.  We discussed sleep hygiene including the fact that she has her TV on at times which may be leading to her waking up abruptly in the middle of the night.  She denies present SI/HI/AVH.  She reports ongoing racing thoughts that are persistent throughout the duration of her LAI.  She does notice additional symptoms of lability and irritability which she feels is related to her LAI starting to become ineffective approximately 1 week prior to her next LAI date.  She does find that Trileptal  helpful for her as she feels that her irritability has gone down somewhat.  She initially denies any substance use but does admit that she snorted cocaine once this past weekend. She denies any other substance use including opiate or sedative otherwise.  Visit Diagnosis:    ICD-10-CM   1. Bipolar  1 disorder, depressed, moderate (HCC) [F31.32]  F31.32     2. Generalized anxiety disorder  F41.1 mirtazapine  (REMERON ) 15 MG tablet    3. Long term current use of antipsychotic medication  Z79.899     4. Severe opioid use disorder (HCC) [F11.20]  F11.20     5. Bipolar 1 disorder, mixed, moderate (HCC)   F31.62 Oxcarbazepine  (TRILEPTAL ) 300 MG tablet    6. Stimulant use disorder  F15.90     7. Sedative, hypnotic or anxiolytic use disorder, moderate, in early remission, dependence (HCC)  F13.21        Past Psychiatric History:  anxiety, depression, bipolar disorder, substance use (cocaine, marijuana, opioids, and benzos)   Past Medical History:  Past Medical History:  Diagnosis Date   Anxiety    Bipolar 1 disorder (HCC)    Carpal tunnel syndrome of right wrist 2016   Chlamydia    Constipation    Ectopic pregnancy 2008 and 2014   2008 required surgery, 2nd Misoprostol   GERD (gastroesophageal reflux disease)    Insomnia    Trichomonas vaginitis    UTI (lower urinary tract infection)     Past Surgical History:  Procedure Laterality Date   ECTOPIC PREGNANCY SURGERY Right 2008    Family Psychiatric History: Sister: Depression/attempted suicide in past, Paternal aunt: Bipolar-attempted suicide in past   Family History:  Family History  Problem Relation Age of Onset   Cancer Sister 100   Healthy Mother    Healthy Father    COPD Sister    Alcohol abuse Neg Hx    Arthritis Neg Hx    Asthma Neg Hx    Birth defects Neg Hx    Depression Neg Hx    Diabetes Neg Hx    Drug abuse Neg Hx    Early death Neg Hx    Hearing loss Neg Hx    Heart disease Neg Hx    Hyperlipidemia Neg Hx    Hypertension Neg Hx    Kidney disease Neg Hx    Learning disabilities Neg Hx    Mental illness Neg Hx    Mental retardation Neg Hx    Miscarriages / Stillbirths Neg Hx    Stroke Neg Hx    Vision loss Neg Hx    Varicose Veins Neg Hx     Social History:   Social History   Socioeconomic History   Marital status: Single    Spouse name: Not on file   Number of children: 0   Years of education: college-1   Highest education level: Not on file  Occupational History   Occupation: unemployed    Comment: Previously worked [employer], Clinical biochemist.  Tobacco Use   Smoking status: Former     Current packs/day: 0.50    Average packs/day: 0.5 packs/day for 24.7 years (12.4 ttl pk-yrs)    Types: Cigarettes    Start date: 05/15/1999   Smokeless tobacco: Never   Tobacco comments:    0.5 PPD, has now started vaping instead (05/05/22)  Vaping Use   Vaping status: Every Day   Substances: Nicotine , THC, CBD  Substance and Sexual Activity   Alcohol use: Not Currently    Comment: occasionally   Drug use: Not Currently    Comment: Last used June 2019. Marijuana: On Saturday   Sexual activity: Not Currently    Partners: Male    Birth control/protection: Condom    Comment: 1 partner  Other Topics Concern   Not on file  Social History Narrative   Born in [place], [place], in 1998 when her mother got a job in one of the [place].   Now living with her father.  Sometimes with her sister.   Parents separated.   Social Drivers of Health   Financial Resource Strain: High Risk (07/20/2021)   Overall Financial Resource Strain (CARDIA)    Difficulty of Paying Living Expenses: Very hard  Food Insecurity: Food Insecurity Present (07/20/2021)   Hunger Vital Sign    Worried About Running Out of Food in the Last Year: Often true    Ran Out of Food in the Last Year: Often true  Transportation Needs: Unmet Transportation Needs (07/20/2021)   PRAPARE - Administrator, Civil Service (Medical): Yes    Lack of Transportation (Non-Medical): Yes  Physical Activity: Inactive (07/20/2021)   Exercise Vital Sign    Days of Exercise per Week: 0 days    Minutes of Exercise per Session: 0 min  Stress: Stress Concern Present (07/20/2021)   [employer] of Occupational Health - Occupational Stress Questionnaire    Feeling of Stress : Very much  Social Connections: Socially Isolated (07/20/2021)   Social Connection and Isolation Panel [NHANES]    Frequency of Communication with Friends and Family: Once a week    Frequency of Social Gatherings with  Friends and Family: Never    Attends Religious Services: 1 to 4 times per year    Active Member of [employer] or Organizations: No    Attends Banker Meetings: Never    Marital Status: Never married    Allergies:  Allergies  Allergen Reactions   Depakene [Divalproex  Sodium] Other (See Comments)    "made me feel like I was going to pass out"    Current Medications: Current Outpatient Medications  Medication Sig Dispense Refill   ARIPiprazole  (ABILIFY ) 2 MG tablet Take 1 tablet (2 mg total) by mouth daily. 90 tablet 0   ARIPiprazole  ER (ABILIFY  ASIMTUFII) 960 MG/3.2ML PRSY Inject 960 mg into the muscle every 2 months. 3.2 mL 11   buprenorphine -naloxone  (SUBOXONE ) 8-2 mg SUBL SL tablet Place 1 tablet under the tongue 2 (two) times daily. 28 tablet 1   DENTA 5000 PLUS 1.1 % CREA dental cream Take 1 Application by mouth daily.     hydrOXYzine  (ATARAX ) 25 MG tablet Take 1 tablet (25 mg total) by mouth 3 (three) times daily as needed for anxiety or itching. 90 tablet 0   ibuprofen  (ADVIL ,MOTRIN ) 200 MG tablet Take 800 mg by mouth every 6 (six) hours as needed for headache or moderate pain (pain score 4-6).     metroNIDAZOLE  (FLAGYL ) 500 MG tablet Take 1 tablet (500 mg total) by mouth 2 (two) times daily. 14 tablet 0   mirtazapine  (REMERON ) 15 MG tablet Take 1 tablet (15 mg total) by mouth at bedtime. 90 tablet 0   nitrofurantoin , macrocrystal-monohydrate, (MACROBID ) 100 MG capsule Take 1 capsule (100 mg total) by mouth 2 (two) times daily. 10 capsule 0   Oxcarbazepine  (TRILEPTAL ) 300 MG tablet Take 1 tablet (300 mg total) by mouth 2 (two) times daily. 180 tablet 0   Prenatal Vit-Fe Fumarate-FA (M-NATAL PLUS) 27-1 MG TABS Take 1 tablet by mouth daily.     senna (SENOKOT) 8.6 MG TABS tablet Take 1 tablet by mouth daily. (Patient not taking: Reported on 08/10/2023) 30 tablet 1   traZODone  (DESYREL ) 50 MG tablet Take 0.5-1 tablets (25-50 mg  total) by mouth at bedtime. 90 tablet 0   No  current facility-administered medications for this visit.   Psychiatric Specialty Exam: Physical Exam Constitutional:      Appearance: the patient is not toxic-appearing.  Pulmonary:     Effort: Pulmonary effort is normal.  Neurological:     General: No focal deficit present.     Mental Status: the patient is alert and oriented to person, place, and time.   Review of Systems  Respiratory:  Negative for shortness of breath.   Cardiovascular:  Negative for chest pain.  Gastrointestinal:  Negative for abdominal pain, constipation, diarrhea, nausea and vomiting.  Neurological:  Negative for headaches.      There were no vitals taken for this visit.  General Appearance: Fairly Groomed  Eye Contact:  Good  Speech:  Clear and Coherent  Volume:  Normal  Mood:  Euthymic  Affect:  Congruent  Thought Process:  Coherent  Orientation:  Full (Time, Place, and Person)  Thought Content: Logical   Suicidal Thoughts:  No  Homicidal Thoughts:  No  Memory:  Immediate;   Good  Judgement:  fair  Insight:  fair  Psychomotor Activity:  Normal  Concentration:  Concentration: Good  Recall:  Good  Fund of Knowledge: Good  Language: Good  Akathisia:  No  Handed:  not assessed  AIMS (if indicated): not done  Assets:  Communication Skills Desire for Improvement Financial Resources/Insurance Housing Leisure Time Physical Health  ADL's:  Intact  Cognition: WNL  Sleep:  Fair     Metabolic Disorder Labs: Lab Results  Component Value Date   HGBA1C 6.0 (H) 02/21/2015   MPG 126 02/21/2015   No results found for: "PROLACTIN" Lab Results  Component Value Date   CHOL 138 02/21/2015   TRIG 60 02/21/2015   HDL 51 02/21/2015   CHOLHDL 2.7 02/21/2015   VLDL 12 02/21/2015   LDLCALC 75 02/21/2015   LDLCALC 86 04/03/2009   Lab Results  Component Value Date   TSH 0.299 (L) 02/20/2015   TSH 0.519 05/10/2010    Therapeutic Level Labs: No results found for: "LITHIUM" No results found for:  "VALPROATE" Lab Results  Component Value Date   CBMZ 7.1 02/21/2015    Screenings: AIMS    Flowsheet Row Office Visit from 06/13/2023 in [facility]) [facility]) [facility]) [facility]  AIMS Total Score 0 0 0 0      AUDIT    Flowsheet Row Counselor from 07/20/2021 in [facility] Admission (Discharged) [facility] (Discharged) [facility] (Dis[facility]) [facility]  Alcohol Use Disorder Identification Test Final Score (AUDIT) 7 3 18 1       GAD-7    Flowsheet Row Office Visit from 07/25/2023 in [facility] Most recent reading at 07/25/2023  4:58 PM Office Visit from 06/13/2023 in [facility] Most recent reading at 06/13/2023 10:35 AM Clinical Support from 06/13/2023 in [facility] Most recent reading at 06/13/2023 10:23 AM Office Visit from 02/07/2023 in [facility] Most recent reading at 02/07/2023  1:36 PM Office Visit from 08/03/2022 in [facility] Most recent  reading at 08/03/2022  3:48 PM  Total GAD-7 Score 20 16 16 21 20       PHQ2-9    Flowsheet Row Office Visit from 07/25/2023 in [facility] Most recent reading at 07/25/2023  4:57 PM Office Visit from 06/13/2023 in [facility] Most recent reading at 06/13/2023 10:35 AM Clinical Support from 06/13/2023 in [facility] Most recent reading at 06/13/2023 10:22 AM Office Visit from 02/07/2023 in [facility] Most recent reading at 02/07/2023  1:35 PM Office Visit from 08/03/2022 in [facility] Most recent reading at 08/03/2022  3:46 PM  PHQ-2 Total Score 4 4 4 6 4   PHQ-9 Total Score 16 12 12 27 21       Flowsheet Row ED from 10/23/2023 in [facility] Visit from 02/07/2023 in [facility] Office Visit from 05/05/2022 in [facility]  C-SSRS RISK CATEGORY No Risk Error: Q7 should not be populated when Q6 is No No Risk       Collaboration of Care: Collaboration of Care:  Patient/Guardian was advised Release of Information must be obtained prior to any record release in order to collaborate their care with an outside provider. Patient/Guardian was advised if they have not already done so to contact the registration department to sign all necessary forms in order for us  to release information regarding their care.   Consent: Patient/Guardian gives verbal consent for treatment and assignment of benefits for services provided during this visit. Patient/Guardian expressed understanding and agreed to proceed.   Augusta Blizzard, MD 02/01/2024, 5:02 PM

## 2024-03-20 ENCOUNTER — Encounter (HOSPITAL_COMMUNITY): Payer: Self-pay

## 2024-03-20 ENCOUNTER — Ambulatory Visit (HOSPITAL_COMMUNITY): Payer: MEDICAID

## 2024-03-26 NOTE — Progress Notes (Unsigned)
 BH MD Outpatient Progress Note  03/28/2024 3:56 PM Natalie Fischer  MRN:  [id number]  Assessment:  Natalie Fischer presents to as a walk-in for medication management.  She wanted to establish care with an outpatient psychiatrist.  She has been regularly seen at the [facility] clinic for Abilify  asimtufii 960.  She has been compliant with LAI injection but continued to have ongoing symptoms of hypomania. Trileptal  was initiated at the time to address symptoms. Patient continues to struggle with sporadic stimulant abuse which may also be contributing to her symptoms of hypomania. Workup is ongoing whether her symptoms are consistent with bipolar disorder or substance-induced bipolar disorder given concomitant stimulant abuse.   Patient continues to endorse some racing thoughts but these have improved since increasing Trileptal .  She reports having problems with hypersomnia and daytime fatigue.  She has been taking trazodone  every night and mirtazapine  15 mg may be worsening her symptoms.  She is unclear whether she currently is experiencing some type of depressive episode.  She also feels that her ongoing fatigue may be secondary to her former stimulant use disorder.  Patient will be obtaining lab appointment here in order to get updated metabolic labs.  She continues to go to [facility] stop for Suboxone  for her opiate use disorder.  Increasing mirtazapine  to better aid with depression and reduce sedative quality of mirtazapine .  Identifying Information: Natalie Fischer is a 43 y.o. 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   Plan:  # Bipolar disorder, currently depressed #Generalized anxiety disorder Status of problem: Active Interventions: -- Continue Abilify  ER Asimtufii 960 mg every 7 weeks --  Needs ECG, lipid panel, A1c (lab appointment) -- Continue Abilify  2 mg daily --Continue hydroxyzine  25 mg 3 times daily as needed -- Increase mirtazapine  to 30 mg nightly -- Continue trazodone  25 mg nightly as needed for insomnia -- Continue Trileptal  300 mg twice daily for mood stabilization  # Opiate use disorder in early remission #Stimulant use disorder, in early remission #Sedative use, early remission Past medication trials:  Status of problem: Remission Interventions: -- Advised continued cessation -- Patient on suboxone  through [facility] stop --Recommend substance use counseling in order to maintain sobriety  Patient was given contact information for behavioral health clinic and was instructed to call 911 for emergencies.    Subjective:  Chief Complaint: Medication Management   Interval History:  Patient reports improvement in irritability and racing thoughts since last visit.  She does report experiencing significant hypersomnia reporting at least 10 to 12 hours of sleep per night.  Denies SI/HI/AVH.  Reports no further cocaine use since last visit.  Reports returning to [facility] stop for her Suboxone .  Reports feeling that switching from every 8 weeks to every 7 weeks for LAI has been effective in preventing last week feeling as though medication is wearing off. She reports sometimes feeling that her hypersomnia secondary to depression but is uncertain. She uses trazodone  half tablet every night even when not eating it.  She denies anhedonia.  Encouraged behavioral activation as patient states at home and minimally does activities.  Also encourage patient to ensure she is maintaining a daytime and nighttime wake sleep cycle.  Visit Diagnosis:    ICD-10-CM   1. Bipolar 1 disorder, mixed, moderate (HCC)  F31.62 ARIPiprazole  (ABILIFY ) 2 MG  tablet    Oxcarbazepine  (TRILEPTAL ) 300 MG tablet    2. Generalized anxiety disorder  F41.1 mirtazapine  (REMERON ) 30 MG tablet    3.  Insomnia, unspecified type  G47.00 traZODone  (DESYREL ) 50 MG tablet        Past Psychiatric History:  anxiety, depression, bipolar disorder, substance use (cocaine, marijuana, opioids, and benzos)   Past Medical History:  Past Medical History:  Diagnosis Date   Anxiety    Bipolar 1 disorder (HCC)    Carpal tunnel syndrome of right wrist 2016   Chlamydia    Constipation    Ectopic pregnancy 2008 and 2014   2008 required surgery, 2nd Misoprostol   GERD (gastroesophageal reflux disease)    Insomnia    Trichomonas vaginitis    UTI (lower urinary tract infection)     Past Surgical History:  Procedure Laterality Date   ECTOPIC PREGNANCY SURGERY Right 2008    Family Psychiatric History: Sister: Depression/attempted suicide in past, Paternal aunt: Bipolar-attempted suicide in past   Family History:  Family History  Problem Relation Age of Onset   Cancer Sister 19   Healthy Mother    Healthy Father    COPD Sister    Alcohol abuse Neg Hx    Arthritis Neg Hx    Asthma Neg Hx    Birth defects Neg Hx    Depression Neg Hx    Diabetes Neg Hx    Drug abuse Neg Hx    Early death Neg Hx    Hearing loss Neg Hx    Heart disease Neg Hx    Hyperlipidemia Neg Hx    Hypertension Neg Hx    Kidney disease Neg Hx    Learning disabilities Neg Hx    Mental illness Neg Hx    Mental retardation Neg Hx    Miscarriages / Stillbirths Neg Hx    Stroke Neg Hx    Vision loss Neg Hx    Varicose Veins Neg Hx     Social History:   Social History   Socioeconomic History   Marital status: Single    Spouse name: Not on file   Number of children: 0   Years of education: college-1   Highest education level: Not on file  Occupational History   Occupation: unemployed    Comment: Previously worked [employer], Clinical biochemist.  Tobacco Use   Smoking status: Former    Current packs/day: 0.50    Average packs/day: 0.5 packs/day for 24.9 years (12.4 ttl pk-yrs)    Types: Cigarettes    Start  date: 05/15/1999   Smokeless tobacco: Never   Tobacco comments:    0.5 PPD, has now started vaping instead (05/05/22)  Vaping Use   Vaping status: Every Day   Substances: Nicotine , THC, CBD  Substance and Sexual Activity   Alcohol use: Not Currently    Comment: occasionally   Drug use: Not Currently    Comment: Last used June 2019. Marijuana: On Saturday   Sexual activity: Not Currently    Partners: Male    Birth control/protection: Condom    Comment: 1 partner  Other Topics Concern   Not on file  Social History Narrative   Born in [place], [place] to [place], in 1998 when her mother got a job in one of the mills here.   Now living with her father.  Sometimes with her sister.   Parents separated.   Social Drivers of Corporate investment banker  Strain: High Risk (07/20/2021)   Overall Financial Resource Strain (CARDIA)    Difficulty of Paying Living Expenses: Very hard  Food Insecurity: Food Insecurity Present (07/20/2021)   Hunger Vital Sign    Worried About Running Out of Food in the Last Year: Often true    Ran Out of Food in the Last Year: Often true  Transportation Needs: Unmet Transportation Needs (07/20/2021)   PRAPARE - Administrator, Civil Service (Medical): Yes    Lack of Transportation (Non-Medical): Yes  Physical Activity: Inactive (07/20/2021)   Exercise Vital Sign    Days of Exercise per Week: 0 days    Minutes of Exercise per Session: 0 min  Stress: Stress Concern Present (07/20/2021)   [employer] of Occupational Health - Occupational Stress Questionnaire    Feeling of Stress : Very much  Social Connections: Socially Isolated (07/20/2021)   Social Connection and Isolation Panel [NHANES]    Frequency of Communication with Friends and Family: Once a week    Frequency of Social Gatherings with Friends and Family: Never    Attends Religious Services: 1 to 4 times per year    Active Member of [employer] or Organizations: No     Attends Banker Meetings: Never    Marital Status: Never married    Allergies:  Allergies  Allergen Reactions   Depakene [Divalproex  Sodium] Other (See Comments)    "made me feel like I was going to pass out"    Current Medications: Current Outpatient Medications  Medication Sig Dispense Refill   ARIPiprazole  (ABILIFY ) 2 MG tablet Take 1 tablet (2 mg total) by mouth daily. 90 tablet 0   ARIPiprazole  ER (ABILIFY  ASIMTUFII) 960 MG/3.2ML PRSY Inject 960 mg into the muscle every 2 months. 3.2 mL 11   buprenorphine -naloxone  (SUBOXONE ) 8-2 mg SUBL SL tablet Place 1 tablet under the tongue 2 (two) times daily. 28 tablet 1   DENTA 5000 PLUS 1.1 % CREA dental cream Take 1 Application by mouth daily.     hydrOXYzine  (ATARAX ) 25 MG tablet Take 1 tablet (25 mg total) by mouth 3 (three) times daily as needed for anxiety or itching. 90 tablet 0   ibuprofen  (ADVIL ,MOTRIN ) 200 MG tablet Take 800 mg by mouth every 6 (six) hours as needed for headache or moderate pain (pain score 4-6).     metroNIDAZOLE  (FLAGYL ) 500 MG tablet Take 1 tablet (500 mg total) by mouth 2 (two) times daily. 14 tablet 0   mirtazapine  (REMERON ) 30 MG tablet Take 1 tablet (30 mg total) by mouth at bedtime. 90 tablet 0   nitrofurantoin , macrocrystal-monohydrate, (MACROBID ) 100 MG capsule Take 1 capsule (100 mg total) by mouth 2 (two) times daily. 10 capsule 0   Oxcarbazepine  (TRILEPTAL ) 300 MG tablet Take 1 tablet (300 mg total) by mouth 2 (two) times daily. 180 tablet 0   Prenatal Vit-Fe Fumarate-FA (M-NATAL PLUS) 27-1 MG TABS Take 1 tablet by mouth daily.     senna (SENOKOT) 8.6 MG TABS tablet Take 1 tablet by mouth daily. (Patient not taking: Reported on 08/10/2023) 30 tablet 1   traZODone  (DESYREL ) 50 MG tablet Take 0.5 tablets (25 mg total) by mouth at bedtime as needed for sleep. 45 tablet 0   No current facility-administered medications for this visit.   Psychiatric Specialty Exam: Physical  Exam Constitutional:      Appearance: the patient is not toxic-appearing.  Pulmonary:     Effort: Pulmonary effort is normal.  Neurological:  General: No focal deficit present.     Mental Status: the patient is alert and oriented to person, place, and time.   Review of Systems  Respiratory:  Negative for shortness of breath.   Cardiovascular:  Negative for chest pain.  Gastrointestinal:  Negative for abdominal pain, constipation, diarrhea, nausea and vomiting.  Neurological:  Negative for headaches.      There were no vitals taken for this visit.  General Appearance: Fairly Groomed  Eye Contact:  Good  Speech:  Clear and Coherent  Volume:  Normal  Mood:  Euthymic  Affect:  Congruent  Thought Process:  Coherent  Orientation:  Full (Time, Place, and Person)  Thought Content: Logical   Suicidal Thoughts:  No  Homicidal Thoughts:  No  Memory:  Immediate;   Good  Judgement:  fair  Insight:  fair  Psychomotor Activity:  Normal  Concentration:  Concentration: Good  Recall:  Good  Fund of Knowledge: Good  Language: Good  Akathisia:  No  Handed:  not assessed  AIMS (if indicated): not done  Assets:  Communication Skills Desire for Improvement Financial Resources/Insurance Housing Leisure Time Physical Health  ADL's:  Intact  Cognition: WNL  Sleep:  Fair     Metabolic Disorder Labs: Lab Results  Component Value Date   HGBA1C 6.0 (H) 02/21/2015   MPG 126 02/21/2015   No results found for: "PROLACTIN" Lab Results  Component Value Date   CHOL 138 02/21/2015   TRIG 60 02/21/2015   HDL 51 02/21/2015   CHOLHDL 2.7 02/21/2015   VLDL 12 02/21/2015   LDLCALC 75 02/21/2015   LDLCALC 86 04/03/2009   Lab Results  Component Value Date   TSH 0.299 (L) 02/20/2015   TSH 0.519 05/10/2010    Therapeutic Level Labs: No results found for: "LITHIUM" No results found for: "VALPROATE" Lab Results  Component Value Date   CBMZ 7.1 02/21/2015    Screenings: AIMS     Flowsheet Row Office Visit from 06/13/2023 in [facility]) [facility]) [facility]) [facility]  AIMS Total Score 0 0 0 0      AUDIT    Flowsheet Row Counselor from 07/20/2021 in [facility] Ad[facility] (Dis[facility]) [facility]) [facility]  Alcohol Use Disorder Identification Test Final Score (AUDIT) 7 3 18 1       GAD-7    Flowsheet Row Office Visit from 07/25/2023 in [facility] Most recent reading at 07/25/2023  4:58 PM Office Visit from 06/13/2023 in [facility] Most recent reading at 06/13/2023 10:35 AM Clinical Support from 06/13/2023 in [facility] Most recent reading at 06/13/2023 10:23 AM Office Visit from 02/07/2023 in [facility] Most recent reading at 02/07/2023  1:36 PM Office Visit from 08/03/2022 in [facility] Most recent reading at 08/03/2022  3:48 PM  Total GAD-7 Score 20 16 16 21 20       PHQ2-9    Flowsheet Row Office Visit from 07/25/2023 in [facility] Most recent reading at 07/25/2023  4:57 PM Office Visit from 06/13/2023 in [facility] Most  recent reading at 06/13/2023 10:35 AM Clinical Support from 06/13/2023 in [facility] Most recent reading at 06/13/2023 10:22 AM Office Visit from 02/07/2023 in [facility] Most recent reading at 02/07/2023  1:35 PM Office Visit from  08/03/2022 in [facility] Most recent reading at 08/03/2022  3:46 PM  PHQ-2 Total Score 4 4 4 6 4   PHQ-9 Total Score 16 12 12 27 21       Flowsheet Row UC from 10/23/2023 in [facility] Visit from 02/07/2023 in [facility] Office Visit from 05/05/2022 in [facility]  C-SSRS RISK CATEGORY No Risk Error: Q7 should not be populated when Q6 is No No Risk       Collaboration of Care: Collaboration of Care:  Patient/Guardian was advised Release of Information must be obtained prior to any record release in order to collaborate their care with an outside provider. Patient/Guardian was advised if they have not already done so to contact the registration department to sign all necessary forms in order for us  to release information regarding their care.   Consent: Patient/Guardian gives verbal consent for treatment and assignment of benefits for services provided during this visit. Patient/Guardian expressed understanding and agreed to proceed.   Augusta Blizzard, MD 03/28/2024, 3:56 PM

## 2024-03-27 ENCOUNTER — Ambulatory Visit (HOSPITAL_COMMUNITY): Payer: MEDICAID

## 2024-03-27 ENCOUNTER — Ambulatory Visit (INDEPENDENT_AMBULATORY_CARE_PROVIDER_SITE_OTHER): Payer: MEDICAID

## 2024-03-27 ENCOUNTER — Ambulatory Visit (INDEPENDENT_AMBULATORY_CARE_PROVIDER_SITE_OTHER): Payer: MEDICAID | Admitting: Student

## 2024-03-27 VITALS — BP 113/77 | HR 95 | Temp 98.0°F | Ht 71.0 in | Wt 151.0 lb

## 2024-03-27 DIAGNOSIS — G4700 Insomnia, unspecified: Secondary | ICD-10-CM | POA: Diagnosis not present

## 2024-03-27 DIAGNOSIS — F3132 Bipolar disorder, current episode depressed, moderate: Secondary | ICD-10-CM

## 2024-03-27 DIAGNOSIS — F411 Generalized anxiety disorder: Secondary | ICD-10-CM

## 2024-03-27 DIAGNOSIS — F3162 Bipolar disorder, current episode mixed, moderate: Secondary | ICD-10-CM

## 2024-03-27 MED ORDER — TRAZODONE HCL 50 MG PO TABS
25.0000 mg | ORAL_TABLET | Freq: Every evening | ORAL | 0 refills | Status: AC | PRN
Start: 2024-03-27 — End: 2024-06-25

## 2024-03-27 MED ORDER — ARIPIPRAZOLE ER 960 MG/3.2ML IM PRSY
960.0000 mg | PREFILLED_SYRINGE | Freq: Once | INTRAMUSCULAR | Status: AC
  Administered 2024-03-27: 960 mg via INTRAMUSCULAR

## 2024-03-27 MED ORDER — ARIPIPRAZOLE 2 MG PO TABS: 2.0000 mg | ORAL_TABLET | Freq: Every day | ORAL | 0 refills | Status: DC

## 2024-03-27 MED ORDER — OXCARBAZEPINE 300 MG PO TABS: 300.0000 mg | ORAL_TABLET | Freq: Two times a day (BID) | ORAL | 0 refills | Status: DC

## 2024-03-27 MED ORDER — MIRTAZAPINE 30 MG PO TABS: 30.0000 mg | ORAL_TABLET | Freq: Every day | ORAL | 0 refills | Status: DC

## 2024-03-27 NOTE — Progress Notes (Signed)
 Pt presents today for injection of Abilify  Asimtufii. Pt has no concerns regarding injection today and was tolerated successfully in right Upper Outer Quadrant .   JNL, CMA  Patient is lovely as always with  a very sweet personality. Pt states that she is not experiencing any SI, and HI, or AVH. Pt does state that she is forgetting things more than usual, and having trouble sleeping. This was addressed to provider of the day.    PT is every 7 weeks instead of 8 weeks.   JNL, CMA

## 2024-03-29 NOTE — Addendum Note (Signed)
 Addended by: Donnelly Gainer on: 03/29/2024 09:58 AM   Modules accepted: Level of Service

## 2024-04-08 ENCOUNTER — Other Ambulatory Visit (HOSPITAL_COMMUNITY): Payer: MEDICAID

## 2024-05-15 ENCOUNTER — Encounter (HOSPITAL_COMMUNITY): Payer: Self-pay

## 2024-05-15 ENCOUNTER — Ambulatory Visit (HOSPITAL_COMMUNITY): Payer: MEDICAID

## 2024-05-21 ENCOUNTER — Ambulatory Visit (HOSPITAL_COMMUNITY): Payer: MEDICAID

## 2024-05-21 ENCOUNTER — Encounter (HOSPITAL_COMMUNITY): Payer: Self-pay

## 2024-06-05 ENCOUNTER — Ambulatory Visit (INDEPENDENT_AMBULATORY_CARE_PROVIDER_SITE_OTHER): Payer: MEDICAID

## 2024-06-05 ENCOUNTER — Encounter (HOSPITAL_COMMUNITY): Payer: Self-pay

## 2024-06-05 VITALS — BP 115/95 | HR 115 | Wt 158.4 lb

## 2024-06-05 DIAGNOSIS — F200 Paranoid schizophrenia: Secondary | ICD-10-CM

## 2024-06-05 DIAGNOSIS — F411 Generalized anxiety disorder: Secondary | ICD-10-CM | POA: Diagnosis not present

## 2024-06-05 DIAGNOSIS — G4700 Insomnia, unspecified: Secondary | ICD-10-CM

## 2024-06-05 MED ORDER — ARIPIPRAZOLE ER 960 MG/3.2ML IM PRSY
960.0000 mg | PREFILLED_SYRINGE | Freq: Once | INTRAMUSCULAR | Status: AC
  Administered 2024-06-05 (×2): 960 mg via INTRAMUSCULAR

## 2024-06-05 NOTE — Progress Notes (Signed)
 PATIENT PRESENTS TO THE OFFICE FOR ABILIFY  ASIMTUFII 960 INJECTION GIVEN IN THE LEFT UPPER GLUT BY Modell Fendrick .  PATIENT TOLERATED  WELL

## 2024-06-14 ENCOUNTER — Telehealth (HOSPITAL_COMMUNITY): Payer: Self-pay | Admitting: Student

## 2024-06-14 NOTE — Telephone Encounter (Signed)
 Patient wants information on getting her pit bull certified as an emotional support animal,

## 2024-06-21 NOTE — Telephone Encounter (Signed)
 Can discuss next appointment on 07/03/24.

## 2024-07-03 ENCOUNTER — Ambulatory Visit (HOSPITAL_COMMUNITY): Payer: MEDICAID

## 2024-08-06 ENCOUNTER — Encounter (HOSPITAL_COMMUNITY): Payer: Self-pay

## 2024-08-06 ENCOUNTER — Ambulatory Visit (HOSPITAL_COMMUNITY): Payer: MEDICAID

## 2024-08-07 ENCOUNTER — Ambulatory Visit (HOSPITAL_COMMUNITY): Payer: MEDICAID

## 2024-08-09 ENCOUNTER — Ambulatory Visit (INDEPENDENT_AMBULATORY_CARE_PROVIDER_SITE_OTHER): Payer: MEDICAID

## 2024-08-09 ENCOUNTER — Encounter (HOSPITAL_COMMUNITY): Payer: MEDICAID | Admitting: Student

## 2024-08-09 ENCOUNTER — Other Ambulatory Visit: Payer: Self-pay

## 2024-08-09 ENCOUNTER — Encounter (HOSPITAL_COMMUNITY): Payer: Self-pay

## 2024-08-09 ENCOUNTER — Ambulatory Visit (HOSPITAL_COMMUNITY): Payer: MEDICAID | Admitting: Student

## 2024-08-09 VITALS — BP 128/93 | HR 92 | Resp 18 | Ht 71.0 in | Wt 161.4 lb

## 2024-08-09 DIAGNOSIS — F3132 Bipolar disorder, current episode depressed, moderate: Secondary | ICD-10-CM | POA: Diagnosis not present

## 2024-08-09 DIAGNOSIS — F3162 Bipolar disorder, current episode mixed, moderate: Secondary | ICD-10-CM

## 2024-08-09 DIAGNOSIS — F411 Generalized anxiety disorder: Secondary | ICD-10-CM

## 2024-08-09 DIAGNOSIS — G4700 Insomnia, unspecified: Secondary | ICD-10-CM

## 2024-08-09 MED ORDER — ARIPIPRAZOLE ER 400 MG IM PRSY
400.0000 mg | PREFILLED_SYRINGE | INTRAMUSCULAR | Status: AC
  Administered 2024-08-09 – 2024-10-22 (×2): 400 mg via INTRAMUSCULAR

## 2024-08-09 MED ORDER — ABILIFY MAINTENA 400 MG IM PRSY
400.0000 mg | PREFILLED_SYRINGE | INTRAMUSCULAR | 11 refills | Status: AC
  Filled 2024-08-09: qty 1, 28d supply, fill #0
  Filled 2024-10-03 – 2024-11-11 (×3): qty 1, 28d supply, fill #1

## 2024-08-09 MED ORDER — MIRTAZAPINE 30 MG PO TABS
15.0000 mg | ORAL_TABLET | Freq: Every day | ORAL | Status: DC
Start: 2024-08-09 — End: 2024-08-22

## 2024-08-09 NOTE — Progress Notes (Signed)
 BH MD Outpatient Progress Note  08/09/24 8:07 AM TEGAN BRITAIN  MRN:  [id number]  Assessment:  Natalie Fischer presents to as a walk-in for medication management.  She wanted to establish care with an outpatient psychiatrist.  She has been regularly seen at the [facility] clinic for Abilify  LAI.  She has been compliant with LAI injection but continued to have ongoing symptoms of hypomania. Trileptal  was initiated at the time to address symptoms. Patient continues to struggle with sporadic substance abuse which may also be contributing to her symptoms of hypomania although it has recently improved. Workup is ongoing whether her symptoms are consistent with bipolar disorder or substance-induced bipolar disorder given concomitant stimulant abuse.   Patient missed last LAI and appears to still struggle with some symptoms of hypomania despite change in Asimtufii to every 7 weeks. For this reason, plan is to return to abilify  maintena 400 mg every 4 weeks.  she continues to go to [facility] stop for Suboxone  for her opiate use disorder.  Reducing mirtazapine  due to oversedation on higher dose and has been using it inconsistently so also hoping this will aid with compliance.  Identifying Information: Natalie Fischer is a 43 y.o. 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   Plan:  # Bipolar disorder, currently depressed #Generalized anxiety disorder Status of problem: Active Interventions: -- Switch Abilify  LAI to Maintena 400 mg every 4 weeks -- Needs ECG, lipid panel, A1c (lab appointment) -- Continue Abilify  2 mg daily --Continue hydroxyzine  25 mg 3 times daily as needed -- Decrease mirtazapine  to 15 mg nightly -- Continue trazodone  25 mg nightly as needed for insomnia -- Continue Trileptal  300  mg twice daily for mood stabilization  # Opiate use disorder in early remission #Stimulant use disorder, in early remission #Sedative use, early remission Past medication trials:  Status of problem: Remission Interventions: -- Advised continued cessation -- Patient on suboxone  through Velinda Hazel at [facility] stop --Recommend substance use counseling in order to maintain sobriety  Patient was given contact information for behavioral health clinic and was instructed to call 911 for emergencies.    Subjective:  Chief Complaint: Medication Management   Interval History:  Patient reports recently having a worsening in depressive system to which she expresses it is primarily due to missing her last dose of long-acting injectable approximately 1 week ago.  She reported hypersomnia which she attributed to her worsening depressive symptoms.  Endorsed briefly experiencing passive suicidal ideation but was able to contract for safety and denied any active suicidal ideation.  Patient also denied currently experiencing any suicidal ideation today.  Denies HI/AVH.  Reports no further cocaine use since last visit.  Reports returning to [facility] stop for her Suboxone . Encouraged behavioral activation as patient states at home and minimally does activities.  Also encourage patient to ensure she is maintaining a daytime and nighttime wake sleep cycle.  Visit Diagnosis:    ICD-10-CM   1. Bipolar 1 disorder, mixed, moderate (HCC)  F31.62 ARIPiprazole  ER (ABILIFY  MAINTENA) 400 MG prefilled syringe 400 mg    ARIPiprazole  ER (ABILIFY  MAINTENA) 400 MG PRSY prefilled syringe    2. Insomnia, unspecified type  G47.00     3. Generalized anxiety disorder  F41.1 mirtazapine  (REMERON ) 30 MG tablet         Past Psychiatric History:  anxiety, depression, bipolar disorder, substance use (cocaine, marijuana, opioids, and benzos)   Past Medical History:  Past Medical History:  Diagnosis Date   Anxiety    Bipolar 1  disorder (HCC)    Carpal tunnel syndrome of right wrist 2016   Chlamydia    Constipation    Ectopic pregnancy 2008 and 2014   2008 required surgery, 2nd Misoprostol   GERD (gastroesophageal reflux disease)    Insomnia    Trichomonas vaginitis    UTI (lower urinary tract infection)     Past Surgical History:  Procedure Laterality Date   ECTOPIC PREGNANCY SURGERY Right 2008    Family Psychiatric History: Sister: Depression/attempted suicide in past, Paternal aunt: Bipolar-attempted suicide in past   Family History:  Family History  Problem Relation Age of Onset   Cancer Sister 46   Healthy Mother    Healthy Father    COPD Sister    Alcohol abuse Neg Hx    Arthritis Neg Hx    Asthma Neg Hx    Birth defects Neg Hx    Depression Neg Hx    Diabetes Neg Hx    Drug abuse Neg Hx    Early death Neg Hx    Hearing loss Neg Hx    Heart disease Neg Hx    Hyperlipidemia Neg Hx    Hypertension Neg Hx    Kidney disease Neg Hx    Learning disabilities Neg Hx    Mental illness Neg Hx    Mental retardation Neg Hx    Miscarriages / Stillbirths Neg Hx    Stroke Neg Hx    Vision loss Neg Hx    Varicose Veins Neg Hx     Social History:   Social History   Socioeconomic History   Marital status: Single    Spouse name: Not on file   Number of children: 0   Years of education: college-1   Highest education level: Not on file  Occupational History   Occupation: unemployed    Comment: Previously worked [employer], clinical biochemist.  Tobacco Use   Smoking status: Former    Current packs/day: 0.50    Average packs/day: 0.5 packs/day for 25.3 years (12.6 ttl pk-yrs)    Types: Cigarettes    Start date: 05/15/1999   Smokeless tobacco: Never   Tobacco comments:    0.5 PPD, has now started vaping instead (05/05/22)  Vaping Use   Vaping status: Every Day   Substances: Nicotine , THC, CBD  Substance and Sexual Activity   Alcohol use: Not Currently    Comment: occasionally   Drug use:  Not Currently    Comment: Last used June 2019. Marijuana: On Saturday   Sexual activity: Not Currently    Partners: Male    Birth control/protection: Condom    Comment: 1 partner  Other Topics Concern   Not on file  Social History Narrative   Born [place], [place] when her mother got a job in [place] of the [place].   Now living with her father.  Sometimes with her sister.   Parents separated.   Social Drivers of Health   Financial Resource Strain: High Risk (07/20/2021)   Overall Financial Resource Strain (CARDIA)    Difficulty of Paying Living Expenses: Very hard  Food Insecurity: Food Insecurity Present (07/20/2021)   Hunger Vital Sign    Worried About Running Out of Food in the Last Year: Often true  Ran Out of Food in the Last Year: Often true  Transportation Needs: Unmet Transportation Needs (07/20/2021)   PRAPARE - Transportation    Lack of Transportation (Medical): Yes    Lack of Transportation (Non-Medical): Yes  Physical Activity: Inactive (07/20/2021)   Exercise Vital Sign    Days of Exercise per Week: 0 days    Minutes of Exercise per Session: 0 min  Stress: Stress Concern Present (07/20/2021)   [employer] of Occupational Health - Occupational Stress Questionnaire    Feeling of Stress : Very much  Social Connections: Socially Isolated (07/20/2021)   Social Connection and Isolation Panel    Frequency of Communication with Friends and Family: Once a week    Frequency of Social Gatherings with Friends and Family: Never    Attends Religious Services: 1 to 4 times per year    Active Member of [employer] or Organizations: No    Attends Banker Meetings: Never    Marital Status: Never married    Allergies:  Allergies  Allergen Reactions   Depakene [Divalproex  Sodium] Other (See Comments)    made me feel like I was going to pass out    Current Medications: Current Outpatient Medications  Medication  Sig Dispense Refill   ARIPiprazole  ER (ABILIFY  MAINTENA) 400 MG PRSY prefilled syringe Inject 400 mg into the muscle every 28 (twenty-eight) days. 1 each 11   ARIPiprazole  (ABILIFY ) 2 MG tablet Take 1 tablet (2 mg total) by mouth daily. 90 tablet 0   buprenorphine -naloxone  (SUBOXONE ) 8-2 mg SUBL SL tablet Place 1 tablet under the tongue 2 (two) times daily. 28 tablet 1   DENTA 5000 PLUS 1.1 % CREA dental cream Take 1 Application by mouth daily.     hydrOXYzine  (ATARAX ) 25 MG tablet Take 1 tablet (25 mg total) by mouth 3 (three) times daily as needed for anxiety or itching. 90 tablet 0   ibuprofen  (ADVIL ,MOTRIN ) 200 MG tablet Take 800 mg by mouth every 6 (six) hours as needed for headache or moderate pain (pain score 4-6).     metroNIDAZOLE  (FLAGYL ) 500 MG tablet Take 1 tablet (500 mg total) by mouth 2 (two) times daily. 14 tablet 0   mirtazapine  (REMERON ) 30 MG tablet Take 0.5 tablets (15 mg total) by mouth at bedtime.     nitrofurantoin , macrocrystal-monohydrate, (MACROBID ) 100 MG capsule Take 1 capsule (100 mg total) by mouth 2 (two) times daily. 10 capsule 0   Oxcarbazepine  (TRILEPTAL ) 300 MG tablet Take 1 tablet (300 mg total) by mouth 2 (two) times daily. 180 tablet 0   Prenatal Vit-Fe Fumarate-FA (M-NATAL PLUS) 27-1 MG TABS Take 1 tablet by mouth daily.     senna (SENOKOT) 8.6 MG TABS tablet Take 1 tablet by mouth daily. (Patient not taking: Reported on 08/10/2023) 30 tablet 1   Current Facility-Administered Medications  Medication Dose Route Frequency Provider Last Rate Last Admin   ARIPiprazole  ER (ABILIFY  MAINTENA) 400 MG prefilled syringe 400 mg  400 mg Intramuscular Q28 days    400 mg at 08/09/24 1141   Psychiatric Specialty Exam: Physical Exam Constitutional:      Appearance: the patient is not toxic-appearing.  Pulmonary:     Effort: Pulmonary effort is normal.  Neurological:     General: No focal deficit present.     Mental Status: the patient is alert and oriented to person,  place, and time.   Review of Systems  Respiratory:  Negative for shortness of breath.   Cardiovascular:  Negative for chest  pain.  Gastrointestinal:  Negative for abdominal pain, constipation, diarrhea, nausea and vomiting.  Neurological:  Negative for headaches.      There were no vitals taken for this visit.  General Appearance: Fairly Groomed  Eye Contact:  Good  Speech:  Clear and Coherent  Volume:  Normal  Mood:  Depressed  Affect:  Congruent  Thought Process:  Coherent  Orientation:  Full (Time, Place, and Person)  Thought Content: Logical   Suicidal Thoughts:  No  Homicidal Thoughts:  No  Memory:  Immediate;   Good  Judgement:  fair  Insight:  fair  Psychomotor Activity:  Normal  Concentration:  Concentration: Good  Recall:  Good  Fund of Knowledge: Good  Language: Good  Akathisia:  No  Handed:  not assessed  AIMS (if indicated): not done  Assets:  Communication Skills Desire for Improvement Financial Resources/Insurance Housing Leisure Time Physical Health  ADL's:  Intact  Cognition: WNL  Sleep:  Fair     Metabolic Disorder Labs: Lab Results  Component Value Date   HGBA1C 6.0 (H) 02/21/2015   MPG 126 02/21/2015   No results found for: PROLACTIN Lab Results  Component Value Date   CHOL 138 02/21/2015   TRIG 60 02/21/2015   HDL 51 02/21/2015   CHOLHDL 2.7 02/21/2015   VLDL 12 02/21/2015   LDLCALC 75 02/21/2015   LDLCALC 86 04/03/2009   Lab Results  Component Value Date   TSH 0.299 (L) 02/20/2015   TSH 0.519 05/10/2010    Therapeutic Level Labs: No results found for: LITHIUM No results found for: VALPROATE Lab Results  Component Value Date   CBMZ 7.1 02/21/2015    Screenings: AIMS    Flowsheet Row Office Visit from 06/13/2023 in [facility]) [facility]) [facility]  AIMS Total Score 0 0 0 0   AUDIT    Flowsheet Row Counselor from 07/20/2021 in [facility] Admission (Discharged[facility] (Discharged) from [facility] (Discharged[facility]  Alcohol Use Disorder Identification Test Final Score (AUDIT) 7 3 18 1    GAD-7    Flowsheet Row Office Visit from 07/25/2023 in [facility] Most recent reading at 07/25/2023  4:58 PM Office Visit from 06/13/2023 in [facility] Most recent reading at 06/13/2023 10:35 AM Clinical Support from 06/13/2023 in [facility] Most recent reading at 06/13/2023 10:23 AM Office Visit from 02/07/2023 in [facility] Most recent reading at 02/07/2023  1:36 PM Office Visit from 08/03/2022 in [facility] Most recent reading at 08/03/2022  3:48 PM  Total GAD-7 Score 20 16 16 21 20    PHQ2-9    Flowsheet Row Office Visit from 07/25/2023 in [facility] Most recent reading at 07/25/2023  4:57 PM Office Visit from 06/13/2023 in [facility] Most recent reading at 06/13/2023 10:35 AM Clinical Support from 06/13/2023 in [facility] Most recent reading at 06/13/2023 10:22 AM Office Visit from 02/07/2023 in [facility] Most recent reading at 02/07/2023  1:35 PM Office Visit from 08/03/2022 in [facility]  Most recent reading at 08/03/2022  3:46 PM  PHQ-2 Total Score 4 4 4 6 4   PHQ-9 Total Score 16 12 12 27 21    Flowsheet Row UC from 10/23/2023 in [facility] Visit from 02/07/2023 in  [facility]  C-SSRS RISK CATEGORY No Risk Error: Q7 should not be populated when Q6 is No No Risk    Collaboration of Care: Collaboration of Care:  Patient/Guardian was advised Release of Information must be obtained prior to any record release in order to collaborate their care with an outside provider. Patient/Guardian was advised if they have not already done so to contact the registration department to sign all necessary forms in order for us  to release information regarding their care.   Consent: Patient/Guardian gives verbal consent for treatment and assignment of benefits for services provided during this visit. Patient/Guardian expressed understanding and agreed to proceed.   Prentice Espy, MD 08/16/2024, 8:07 AM

## 2024-08-09 NOTE — Progress Notes (Signed)
 Patient presented to office for injection of Abilify  Maintena 400 mg. Patient received injection in Right upper outer quadrant. Patient tolerated injection well. Patient was seen by provider prior to injection. Patient denies any concerns. Patient will return in 28 days.

## 2024-08-09 NOTE — Progress Notes (Signed)
 Specialty Pharmacy Initial Fill Coordination Note  Natalie Fischer is a 43 y.o. female contacted today regarding initial fill of specialty medication(s) ARIPiprazole  (Abilify  Maintena)   Patient requested Courier to Provider Office   Delivery date: 08/13/24   Verified address: [facility], [address]   Medication will be filled on 10/20.   Patient is aware of $0 copayment.

## 2024-08-09 NOTE — Progress Notes (Signed)
 Pharmacy Patient Advocate Encounter  Insurance verification completed.   The patient is insured through [employer] [place] [employer]   Ran test claim for Abilify  Maintena. Co-pay is $0.  This test claim was processed through [facility]- copay amounts may vary at other pharmacies due to pharmacy/plan contracts, or as the patient moves through the different stages of their insurance plan.

## 2024-08-12 ENCOUNTER — Other Ambulatory Visit: Payer: Self-pay

## 2024-08-22 ENCOUNTER — Ambulatory Visit (HOSPITAL_COMMUNITY): Payer: MEDICAID

## 2024-08-22 ENCOUNTER — Other Ambulatory Visit (HOSPITAL_COMMUNITY): Payer: Self-pay | Admitting: Student

## 2024-08-22 ENCOUNTER — Encounter (HOSPITAL_COMMUNITY): Payer: Self-pay

## 2024-08-22 DIAGNOSIS — F411 Generalized anxiety disorder: Secondary | ICD-10-CM

## 2024-09-02 ENCOUNTER — Other Ambulatory Visit: Payer: Self-pay

## 2024-09-03 ENCOUNTER — Other Ambulatory Visit: Payer: Self-pay

## 2024-09-11 ENCOUNTER — Ambulatory Visit (HOSPITAL_COMMUNITY): Payer: MEDICAID

## 2024-09-11 ENCOUNTER — Other Ambulatory Visit (HOSPITAL_COMMUNITY): Payer: Self-pay

## 2024-09-12 ENCOUNTER — Other Ambulatory Visit (HOSPITAL_COMMUNITY): Payer: Self-pay

## 2024-09-13 ENCOUNTER — Other Ambulatory Visit: Payer: Self-pay

## 2024-09-25 ENCOUNTER — Ambulatory Visit (HOSPITAL_COMMUNITY): Payer: MEDICAID

## 2024-09-26 ENCOUNTER — Ambulatory Visit (HOSPITAL_COMMUNITY): Payer: MEDICAID

## 2024-10-03 ENCOUNTER — Other Ambulatory Visit (HOSPITAL_COMMUNITY): Payer: Self-pay | Admitting: Student

## 2024-10-03 ENCOUNTER — Other Ambulatory Visit: Payer: Self-pay

## 2024-10-03 DIAGNOSIS — G4700 Insomnia, unspecified: Secondary | ICD-10-CM

## 2024-10-03 DIAGNOSIS — F3162 Bipolar disorder, current episode mixed, moderate: Secondary | ICD-10-CM

## 2024-10-04 ENCOUNTER — Other Ambulatory Visit (HOSPITAL_COMMUNITY): Payer: Self-pay

## 2024-10-04 ENCOUNTER — Ambulatory Visit (INDEPENDENT_AMBULATORY_CARE_PROVIDER_SITE_OTHER): Payer: MEDICAID | Admitting: Student

## 2024-10-04 DIAGNOSIS — F3132 Bipolar disorder, current episode depressed, moderate: Secondary | ICD-10-CM

## 2024-10-04 DIAGNOSIS — Z79899 Other long term (current) drug therapy: Secondary | ICD-10-CM

## 2024-10-04 DIAGNOSIS — F411 Generalized anxiety disorder: Secondary | ICD-10-CM

## 2024-10-04 NOTE — Progress Notes (Cosign Needed)
 BH MD Outpatient Progress Note  08/09/24 3:17 PM Natalie Fischer  MRN:  [id number]  Assessment:  Mychal L Fullington presents for medication management.  She has been regularly seen at the [facility] clinic for Abilify  LAI.  She has been compliant with LAI injection but continued to have ongoing symptoms of hypomania. Trileptal  was initiated at the time to address symptoms. Patient continues to struggle with sporadic substance abuse which may also be contributing to her symptoms of hypomania although it has recently improved. Workup is ongoing whether her symptoms are consistent with bipolar disorder or substance-induced bipolar disorder given concomitant stimulant abuse.  Patient missed last LAI appointment in November so plan to restart Abilify  Maintena 400 mg. She did feel medication has been controlling her symptoms when she received LAI in October. For this reason, plan is to continue abilify  maintena 400 mg every 4 weeks.  she continues to go to [facility] stop for Suboxone  for her opiate use disorder.  Reducing mirtazapine  appropriately aided with patient's oversedation.  Identifying Information: Natalie Fischer is a 43 y.o. 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   Plan:  # Bipolar disorder, currently depressed #Generalized anxiety disorder Status of problem: Active Interventions: -- Continue Abilify  LAI to Maintena 400 mg every 4 weeks -- Needs ECG, lipid panel, A1c (lab appointment) -- Continue Abilify  2 mg daily --Continue hydroxyzine  25 mg 3 times daily as needed -- Decrease mirtazapine  to 15 mg nightly -- Continue trazodone  25 mg nightly as needed for insomnia -- Continue Trileptal  300 mg twice daily for mood stabilization  # Opiate use disorder in early remission #Stimulant use  disorder, in early remission #Sedative use, early remission Past medication trials:  Status of problem: Remission Interventions: -- Advised continued cessation -- Patient on suboxone  through [facility] stop --Recommend substance use counseling in order to maintain sobriety  Patient was given contact information for behavioral health clinic and was instructed to call 911 for emergencies.    Subjective:  Chief Complaint: Medication Management   Interval History:  Patient reports recently overall doing well without somatic complaints. Restarting abilify  maintena was helpful although she missed her last.  She reported hypersomnia which she attributed to her worsening depressive symptoms.  Endorsed briefly experiencing passive suicidal ideation but was able to contract for safety and denied any active suicidal ideation.  Patient also denied currently experiencing any suicidal ideation today.  Denies HI/AVH.  Reports no cocaine use since early this year.  Reports continuing to go to [facility] stop for her Suboxone . Encouraged behavioral activation as patient states at home and minimally does activities.  Also encourage patient to ensure she is maintaining a daytime and nighttime wake sleep cycle.  Visit Diagnosis:    ICD-10-CM   1. Bipolar 1 disorder, depressed, moderate (HCC) [F31.32]  F31.32 Comprehensive Metabolic Panel (CMET)    VITAMIN D 25 Hydroxy (Vit-D Deficiency, Fractures)    2. Long term current use of antipsychotic medication  Z79.899 Lipid panel    Hemoglobin A1c    EKG 12-Lead          Past Psychiatric History:  anxiety, depression, bipolar disorder, substance use (cocaine, marijuana, opioids, and benzos)   Past Medical History:  Past Medical History:  Diagnosis Date   Anxiety    Bipolar 1 disorder (HCC)  Carpal tunnel syndrome of right wrist 2016   Chlamydia    Constipation    Ectopic pregnancy 2008 and 2014   2008 required surgery, 2nd Misoprostol   GERD  (gastroesophageal reflux disease)    Insomnia    Trichomonas vaginitis    UTI (lower urinary tract infection)     Past Surgical History:  Procedure Laterality Date   ECTOPIC PREGNANCY SURGERY Right 2008    Family Psychiatric History: Sister: Depression/attempted suicide in past, Paternal aunt: Bipolar-attempted suicide in past   Family History:  Family History  Problem Relation Age of Onset   Cancer Sister 60   Healthy Mother    Healthy Father    COPD Sister    Alcohol abuse Neg Hx    Arthritis Neg Hx    Asthma Neg Hx    Birth defects Neg Hx    Depression Neg Hx    Diabetes Neg Hx    Drug abuse Neg Hx    Early death Neg Hx    Hearing loss Neg Hx    Heart disease Neg Hx    Hyperlipidemia Neg Hx    Hypertension Neg Hx    Kidney disease Neg Hx    Learning disabilities Neg Hx    Mental illness Neg Hx    Mental retardation Neg Hx    Miscarriages / Stillbirths Neg Hx    Stroke Neg Hx    Vision loss Neg Hx    Varicose Veins Neg Hx     Social History:   Social History   Socioeconomic History   Marital status: Single    Spouse name: Not on file   Number of children: 0   Years of education: college-1   Highest education level: Not on file  Occupational History   Occupation: unemployed    Comment: Previously worked [employer], clinical biochemist.  Tobacco Use   Smoking status: Former    Current packs/day: 0.50    Average packs/day: 0.5 packs/day for 25.4 years (12.7 ttl pk-yrs)    Types: Cigarettes    Start date: 05/15/1999   Smokeless tobacco: Never   Tobacco comments:    0.5 PPD, has now started vaping instead (05/05/22)  Vaping Use   Vaping status: Every Day   Substances: Nicotine , THC, CBD  Substance and Sexual Activity   Alcohol use: Not Currently    Comment: occasionally   Drug use: Not Currently    Comment: Last used June 2019. Marijuana: On Saturday   Sexual activity: Not Currently    Partners: Male    Birth control/protection: Condom    Comment: 1  partner  Other Topics Concern   Not on file  Social History Narrative   Born in [place], [place] to [place], in 1998 when her mother got a job in [place] of [place].   Now living with her father.  Sometimes with her sister.   Parents separated.   Social Drivers of Health   Tobacco Use: Medium Risk (08/09/2024)   Patient History    Smoking Tobacco Use: Former    Smokeless Tobacco Use: Never    Passive Exposure: Not on file  Financial Resource Strain: Not on file  Food Insecurity: Not on file  Transportation Needs: Not on file  Physical Activity: Not on file  Stress: Not on file  Social Connections: Not on file  Depression (PHQ2-9): High Risk (07/25/2023)   Depression (PHQ2-9)    PHQ-2 Score: 16  Alcohol Screen:  Not on file  Housing: Not on file  Utilities: Not on file  Health Literacy: Not on file    Allergies:  Allergies  Allergen Reactions   Depakene [Divalproex  Sodium] Other (See Comments)    made me feel like I was going to pass out    Current Medications: Current Outpatient Medications  Medication Sig Dispense Refill   ARIPiprazole  (ABILIFY ) 2 MG tablet TAKE 1 Tablet BY MOUTH ONCE DAILY 90 tablet 0   ARIPiprazole  ER (ABILIFY  MAINTENA) 400 MG PRSY prefilled syringe Inject 400 mg into the muscle every 28 (twenty-eight) days. 1 each 11   buprenorphine -naloxone  (SUBOXONE ) 8-2 mg SUBL SL tablet Place 1 tablet under the tongue 2 (two) times daily. 28 tablet 1   DENTA 5000 PLUS 1.1 % CREA dental cream Take 1 Application by mouth daily.     hydrOXYzine  (ATARAX ) 25 MG tablet Take 1 tablet (25 mg total) by mouth 3 (three) times daily as needed for anxiety or itching. 90 tablet 0   ibuprofen  (ADVIL ,MOTRIN ) 200 MG tablet Take 800 mg by mouth every 6 (six) hours as needed for headache or moderate pain (pain score 4-6).     metroNIDAZOLE  (FLAGYL ) 500 MG tablet Take 1 tablet (500 mg total) by mouth 2 (two) times daily. 14 tablet 0   mirtazapine   (REMERON ) 15 MG tablet Take 1 tablet (15 mg total) by mouth at bedtime. 30 tablet 2   nitrofurantoin , macrocrystal-monohydrate, (MACROBID ) 100 MG capsule Take 1 capsule (100 mg total) by mouth 2 (two) times daily. 10 capsule 0   Oxcarbazepine  (TRILEPTAL ) 300 MG tablet Take 1 tablet (300 mg total) by mouth 2 (two) times daily. 180 tablet 0   Prenatal Vit-Fe Fumarate-FA (M-NATAL PLUS) 27-1 MG TABS Take 1 tablet by mouth daily.     senna (SENOKOT) 8.6 MG TABS tablet Take 1 tablet by mouth daily. (Patient not taking: Reported on 08/10/2023) 30 tablet 1   traZODone  (DESYREL ) 50 MG tablet TAKE ONE-HALF TABLET BY MOUTH at bedtime AS NEEDED FOR SLEEP 45 tablet 0   Current Facility-Administered Medications  Medication Dose Route Frequency Provider Last Rate Last Admin   ARIPiprazole  ER (ABILIFY  MAINTENA) 400 MG prefilled syringe 400 mg  400 mg Intramuscular Q28 days    400 mg at 08/09/24 1141   Psychiatric Specialty Exam: Physical Exam Constitutional:      Appearance: the patient is not toxic-appearing.  Pulmonary:     Effort: Pulmonary effort is normal.  Neurological:     General: No focal deficit present.     Mental Status: the patient is alert and oriented to person, place, and time.   Review of Systems  Respiratory:  Negative for shortness of breath.   Cardiovascular:  Negative for chest pain.  Gastrointestinal:  Negative for abdominal pain, constipation, diarrhea, nausea and vomiting.  Neurological:  Negative for headaches.      There were no vitals taken for this visit.  General Appearance: Fairly Groomed  Eye Contact:  Good  Speech:  Clear and Coherent  Volume:  Normal  Mood:  Depressed  Affect:  Congruent  Thought Process:  Coherent  Orientation:  Full (Time, Place, and Person)  Thought Content: Logical   Suicidal Thoughts:  No  Homicidal Thoughts:  No  Memory:  Immediate;   Good  Judgement:  fair  Insight:  fair  Psychomotor Activity:  Normal  Concentration:   Concentration: Good  Recall:  Good  Fund of Knowledge: Good  Language: Good  Akathisia:  No  Handed:  not assessed  AIMS (if indicated): not done  Assets:  Communication Skills Desire for Improvement Financial Resources/Insurance Housing Leisure Time Physical Health  ADL's:  Intact  Cognition: WNL  Sleep:  Fair     Metabolic Disorder Labs: Lab Results  Component Value Date   HGBA1C 6.0 (H) 02/21/2015   MPG 126 02/21/2015   No results found for: PROLACTIN Lab Results  Component Value Date   CHOL 138 02/21/2015   TRIG 60 02/21/2015   HDL 51 02/21/2015   CHOLHDL 2.7 02/21/2015   VLDL 12 02/21/2015   LDLCALC 75 02/21/2015   LDLCALC 86 04/03/2009   Lab Results  Component Value Date   TSH 0.299 (L) 02/20/2015   TSH 0.519 05/10/2010    Therapeutic Level Labs: No results found for: LITHIUM No results found for: VALPROATE Lab Results  Component Value Date   CBMZ 7.1 02/21/2015    Screenings: AIMS    Flowsheet Row [facility] Visit from 06/13/2023 in [facility]) [facility]) [facility]) [facility]  AIMS Total Score 0 0 0 0   AUDIT    Flowsheet Row Counselor from 07/20/2021 in [facility] Ad[facility] (Discharged) [facility]) from [facility]  Alcohol Use Disorder Identification Test Final Score (AUDIT) 7 3 18 1    GAD-7    Flowsheet Row Office Visit from 07/25/2023 in [facility] Most recent reading at 07/25/2023  4:58 PM Office Visit from 06/13/2023 in [facility] Most recent reading at 06/13/2023 10:35 AM Clinical Support from 06/13/2023 in [facility] Most recent reading at 06/13/2023 10:23 AM Office Visit from 02/07/2023 in [facility] Most recent reading at 02/07/2023  1:36 PM Office Visit from 08/03/2022 in [facility] Most recent reading at 08/03/2022  3:48 PM  Total GAD-7 Score 20 16 16 21 20    PHQ2-9    Flowsheet Row Office Visit from 07/25/2023 in [facility] Most recent reading at 07/25/2023  4:57 PM Office Visit from 06/13/2023 in [facility] Most recent reading at 06/13/2023 10:35 AM Clinical Support from 06/13/2023 in [facility] Most recent reading at 06/13/2023 10:22 AM Office Visit from 02/07/2023 in [facility] Most recent reading at 02/07/2023  1:35 PM Office Visit from 08/03/2022 in [facility] Most recent reading at 08/03/2022  3:46 PM  PHQ-2 Total Score 4 4 4 6 4   PHQ-9 Total Score 16 12 12 27 21    Flowsheet Row UC from 10/23/2023 in [facility] [place] Visit from 02/07/2023 in [facility] Office Visit from 05/05/2022 in [facility]  C-SSRS RISK CATEGORY No Risk Error: Q7 should not be populated when Q6 is No No Risk    Collaboration of Care: Collaboration of Care:  Patient/Guardian was advised Release of Information must be obtained prior to any record release in order to collaborate their care with an outside provider. Patient/Guardian was advised if they have not already done so to contact the registration department to sign all necessary forms  in order for us  to release information regarding their care.   Consent: Patient/Guardian gives verbal consent for treatment and assignment of benefits for services provided during this  visit. Patient/Guardian expressed understanding and agreed to proceed.   Prentice Espy, MD 10/04/2024, 3:17 PM

## 2024-10-08 ENCOUNTER — Other Ambulatory Visit: Payer: Self-pay

## 2024-10-09 ENCOUNTER — Encounter (HOSPITAL_COMMUNITY): Payer: Self-pay

## 2024-10-09 ENCOUNTER — Other Ambulatory Visit (HOSPITAL_COMMUNITY): Payer: MEDICAID

## 2024-10-10 ENCOUNTER — Other Ambulatory Visit: Payer: Self-pay

## 2024-10-10 ENCOUNTER — Encounter (HOSPITAL_COMMUNITY): Payer: Self-pay

## 2024-10-10 ENCOUNTER — Ambulatory Visit (HOSPITAL_COMMUNITY): Payer: MEDICAID

## 2024-10-14 ENCOUNTER — Other Ambulatory Visit: Payer: Self-pay

## 2024-10-22 ENCOUNTER — Encounter (HOSPITAL_COMMUNITY): Payer: Self-pay

## 2024-10-22 ENCOUNTER — Ambulatory Visit (INDEPENDENT_AMBULATORY_CARE_PROVIDER_SITE_OTHER): Payer: MEDICAID

## 2024-10-22 VITALS — BP 122/86 | HR 97 | Temp 97.4°F | Ht 71.0 in | Wt 161.6 lb

## 2024-10-22 DIAGNOSIS — F3162 Bipolar disorder, current episode mixed, moderate: Secondary | ICD-10-CM

## 2024-10-22 DIAGNOSIS — F3132 Bipolar disorder, current episode depressed, moderate: Secondary | ICD-10-CM

## 2024-10-22 NOTE — Progress Notes (Cosign Needed)
 Patient presented to office for injection of Abilify  Maintena 400 mg. Patient reported seeing bugs, racing thoughts and 6/10 depression since being off her last appointment on 10/04/2024. Patient stated things have been going down hill since last office visit but feels like things are improving.  Patient was given 5 mg of Abilify  to restart her injection., instructed by Dr. Harl. This clinical research associate administered Abilify  5mg  and provided patient with samples for the next 14 days.  Patient received injection in Right upper outer quadrant. Patient tolerated injection well.Patient denies any additional concerns. Patient will return in 28 days. Patient left alert and ambulatory.

## 2024-11-11 ENCOUNTER — Other Ambulatory Visit: Payer: Self-pay

## 2024-11-11 NOTE — Progress Notes (Addendum)
 Specialty Pharmacy Refill Coordination Note  Natalie Fischer is a 44 y.o. female assessed today regarding refills of clinic administered specialty medication(s) ARIPiprazole  (ABILIFY , Abilify  Maintena)   Clinic requested Courier to Provider Office   Delivery date: 11/14/24   Verified address: [facility], [address]   Medication will be filled on: 11/13/24  Copay:$0.00 Appointment: 01.27.26

## 2024-11-13 ENCOUNTER — Other Ambulatory Visit: Payer: Self-pay

## 2024-11-14 ENCOUNTER — Other Ambulatory Visit (HOSPITAL_COMMUNITY): Payer: Self-pay | Admitting: Student

## 2024-11-14 DIAGNOSIS — F3162 Bipolar disorder, current episode mixed, moderate: Secondary | ICD-10-CM

## 2024-11-19 ENCOUNTER — Ambulatory Visit (HOSPITAL_COMMUNITY): Payer: MEDICAID

## 2024-12-04 ENCOUNTER — Ambulatory Visit (HOSPITAL_COMMUNITY): Payer: MEDICAID

## 2024-12-06 ENCOUNTER — Encounter (HOSPITAL_COMMUNITY): Payer: MEDICAID | Admitting: Student
